# Patient Record
Sex: Female | Born: 1940 | Race: White | Hispanic: No | Marital: Married | Smoking: Never smoker
Health system: Southern US, Community
[De-identification: ages and names within clinical notes are randomized; demographics above are authoritative.]

## PROBLEM LIST (undated history)

## (undated) DIAGNOSIS — I499 Cardiac arrhythmia, unspecified: Secondary | ICD-10-CM

## (undated) DIAGNOSIS — Z8719 Personal history of other diseases of the digestive system: Secondary | ICD-10-CM

## (undated) DIAGNOSIS — M1990 Unspecified osteoarthritis, unspecified site: Secondary | ICD-10-CM

## (undated) DIAGNOSIS — H3530 Unspecified macular degeneration: Secondary | ICD-10-CM

## (undated) DIAGNOSIS — F32A Depression, unspecified: Secondary | ICD-10-CM

## (undated) DIAGNOSIS — J3489 Other specified disorders of nose and nasal sinuses: Secondary | ICD-10-CM

## (undated) DIAGNOSIS — F329 Major depressive disorder, single episode, unspecified: Secondary | ICD-10-CM

## (undated) DIAGNOSIS — I2510 Atherosclerotic heart disease of native coronary artery without angina pectoris: Secondary | ICD-10-CM

## (undated) DIAGNOSIS — E039 Hypothyroidism, unspecified: Secondary | ICD-10-CM

## (undated) DIAGNOSIS — I10 Essential (primary) hypertension: Secondary | ICD-10-CM

## (undated) DIAGNOSIS — K219 Gastro-esophageal reflux disease without esophagitis: Secondary | ICD-10-CM

## (undated) DIAGNOSIS — L4050 Arthropathic psoriasis, unspecified: Secondary | ICD-10-CM

## (undated) DIAGNOSIS — N390 Urinary tract infection, site not specified: Secondary | ICD-10-CM

## (undated) DIAGNOSIS — K7689 Other specified diseases of liver: Secondary | ICD-10-CM

## (undated) HISTORY — PX: INSERT / REPLACE / REMOVE PACEMAKER: SUR710

## (undated) HISTORY — PX: CORONARY ANGIOPLASTY: SHX604

## (undated) HISTORY — PX: DILATION AND CURETTAGE OF UTERUS: SHX78

## (undated) HISTORY — PX: TONSILLECTOMY: SUR1361

## (undated) HISTORY — PX: COLONOSCOPY: SHX174

---

## 1970-07-25 HISTORY — PX: TUBAL LIGATION: SHX77

## 1997-10-15 ENCOUNTER — Other Ambulatory Visit: Admission: RE | Admit: 1997-10-15 | Discharge: 1997-10-15 | Payer: Self-pay | Admitting: Gynecology

## 1998-07-25 HISTORY — PX: BACK SURGERY: SHX140

## 1998-10-22 ENCOUNTER — Other Ambulatory Visit: Admission: RE | Admit: 1998-10-22 | Discharge: 1998-10-22 | Payer: Self-pay | Admitting: Gynecology

## 1999-05-20 ENCOUNTER — Encounter: Payer: Self-pay | Admitting: Neurosurgery

## 1999-05-24 ENCOUNTER — Inpatient Hospital Stay (HOSPITAL_COMMUNITY): Admission: RE | Admit: 1999-05-24 | Discharge: 1999-05-25 | Payer: Self-pay | Admitting: Neurosurgery

## 1999-05-24 ENCOUNTER — Encounter: Payer: Self-pay | Admitting: Neurosurgery

## 1999-11-18 ENCOUNTER — Other Ambulatory Visit: Admission: RE | Admit: 1999-11-18 | Discharge: 1999-11-18 | Payer: Self-pay | Admitting: Gynecology

## 1999-11-29 ENCOUNTER — Encounter: Admission: RE | Admit: 1999-11-29 | Discharge: 1999-11-29 | Payer: Self-pay | Admitting: Gynecology

## 1999-11-29 ENCOUNTER — Encounter: Payer: Self-pay | Admitting: Gynecology

## 2000-01-12 ENCOUNTER — Encounter: Payer: Self-pay | Admitting: Gynecology

## 2000-01-14 ENCOUNTER — Ambulatory Visit (HOSPITAL_COMMUNITY): Admission: RE | Admit: 2000-01-14 | Discharge: 2000-01-14 | Payer: Self-pay | Admitting: Gynecology

## 2000-01-14 ENCOUNTER — Encounter (INDEPENDENT_AMBULATORY_CARE_PROVIDER_SITE_OTHER): Payer: Self-pay | Admitting: Specialist

## 2000-12-07 ENCOUNTER — Other Ambulatory Visit: Admission: RE | Admit: 2000-12-07 | Discharge: 2000-12-07 | Payer: Self-pay | Admitting: Gynecology

## 2001-01-05 ENCOUNTER — Encounter: Payer: Self-pay | Admitting: Gynecology

## 2001-01-05 ENCOUNTER — Encounter: Admission: RE | Admit: 2001-01-05 | Discharge: 2001-01-05 | Payer: Self-pay | Admitting: Gynecology

## 2001-01-16 ENCOUNTER — Ambulatory Visit (HOSPITAL_COMMUNITY): Admission: RE | Admit: 2001-01-16 | Discharge: 2001-01-16 | Payer: Self-pay | Admitting: Urology

## 2001-01-16 ENCOUNTER — Encounter: Payer: Self-pay | Admitting: Urology

## 2001-01-22 ENCOUNTER — Ambulatory Visit (HOSPITAL_COMMUNITY): Admission: RE | Admit: 2001-01-22 | Discharge: 2001-01-22 | Payer: Self-pay | Admitting: Urology

## 2001-12-10 ENCOUNTER — Other Ambulatory Visit: Admission: RE | Admit: 2001-12-10 | Discharge: 2001-12-10 | Payer: Self-pay | Admitting: Gynecology

## 2002-01-07 ENCOUNTER — Encounter: Admission: RE | Admit: 2002-01-07 | Discharge: 2002-01-07 | Payer: Self-pay | Admitting: Gynecology

## 2002-01-07 ENCOUNTER — Encounter: Payer: Self-pay | Admitting: Gynecology

## 2002-12-12 ENCOUNTER — Other Ambulatory Visit: Admission: RE | Admit: 2002-12-12 | Discharge: 2002-12-12 | Payer: Self-pay | Admitting: Gynecology

## 2003-01-09 ENCOUNTER — Encounter: Admission: RE | Admit: 2003-01-09 | Discharge: 2003-01-09 | Payer: Self-pay | Admitting: Gynecology

## 2003-01-09 ENCOUNTER — Encounter: Payer: Self-pay | Admitting: Gynecology

## 2003-07-26 HISTORY — PX: OTHER SURGICAL HISTORY: SHX169

## 2003-12-17 ENCOUNTER — Other Ambulatory Visit: Admission: RE | Admit: 2003-12-17 | Discharge: 2003-12-17 | Payer: Self-pay | Admitting: Gynecology

## 2004-01-12 ENCOUNTER — Ambulatory Visit (HOSPITAL_COMMUNITY): Admission: RE | Admit: 2004-01-12 | Discharge: 2004-01-12 | Payer: Self-pay | Admitting: Gynecology

## 2004-05-25 ENCOUNTER — Ambulatory Visit (HOSPITAL_COMMUNITY): Admission: RE | Admit: 2004-05-25 | Discharge: 2004-05-25 | Payer: Self-pay | Admitting: Neurosurgery

## 2004-12-23 ENCOUNTER — Other Ambulatory Visit: Admission: RE | Admit: 2004-12-23 | Discharge: 2004-12-23 | Payer: Self-pay | Admitting: Gynecology

## 2005-01-12 ENCOUNTER — Ambulatory Visit (HOSPITAL_COMMUNITY): Admission: RE | Admit: 2005-01-12 | Discharge: 2005-01-12 | Payer: Self-pay | Admitting: Gynecology

## 2005-05-13 ENCOUNTER — Encounter: Admission: RE | Admit: 2005-05-13 | Discharge: 2005-05-13 | Payer: Self-pay | Admitting: Family Medicine

## 2005-08-30 ENCOUNTER — Encounter: Admission: RE | Admit: 2005-08-30 | Discharge: 2005-08-30 | Payer: Self-pay | Admitting: Family Medicine

## 2005-11-25 ENCOUNTER — Encounter: Admission: RE | Admit: 2005-11-25 | Discharge: 2005-11-25 | Payer: Self-pay | Admitting: Family Medicine

## 2005-11-28 ENCOUNTER — Encounter: Admission: RE | Admit: 2005-11-28 | Discharge: 2006-01-06 | Payer: Self-pay | Admitting: Family Medicine

## 2006-01-13 ENCOUNTER — Ambulatory Visit (HOSPITAL_COMMUNITY): Admission: RE | Admit: 2006-01-13 | Discharge: 2006-01-13 | Payer: Self-pay | Admitting: Gynecology

## 2006-07-25 HISTORY — PX: RECTAL PROLAPSE REPAIR: SHX759

## 2006-12-28 ENCOUNTER — Other Ambulatory Visit: Admission: RE | Admit: 2006-12-28 | Discharge: 2006-12-28 | Payer: Self-pay | Admitting: Gynecology

## 2007-01-23 ENCOUNTER — Ambulatory Visit (HOSPITAL_COMMUNITY): Admission: RE | Admit: 2007-01-23 | Discharge: 2007-01-23 | Payer: Self-pay | Admitting: Gynecology

## 2008-01-24 ENCOUNTER — Ambulatory Visit (HOSPITAL_COMMUNITY): Admission: RE | Admit: 2008-01-24 | Discharge: 2008-01-24 | Payer: Self-pay | Admitting: Gynecology

## 2008-04-07 ENCOUNTER — Ambulatory Visit (HOSPITAL_BASED_OUTPATIENT_CLINIC_OR_DEPARTMENT_OTHER): Admission: RE | Admit: 2008-04-07 | Discharge: 2008-04-08 | Payer: Self-pay | Admitting: Gynecology

## 2009-01-27 ENCOUNTER — Ambulatory Visit (HOSPITAL_COMMUNITY): Admission: RE | Admit: 2009-01-27 | Discharge: 2009-01-27 | Payer: Self-pay | Admitting: Gynecology

## 2010-01-28 ENCOUNTER — Ambulatory Visit (HOSPITAL_COMMUNITY): Admission: RE | Admit: 2010-01-28 | Discharge: 2010-01-28 | Payer: Self-pay | Admitting: Gynecology

## 2010-12-07 NOTE — Op Note (Signed)
[name], [name]                ACCOUNT NO.:  [id number]   MEDICAL RECORD NO.:  [id number]          PATIENT TYPE:  AMB   LOCATION:  [facility]                         FACILITY:  [facility]   PHYSICIAN:  Gretta Cool, M.D. 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   PROCEDURE:  Rectocele, enterocele and cardinal uterosacral colposuspension.   SURGEON:  Gretta Cool, M.D.   ASSISTANT:  Luvenia Redden, M.D.   ANESTHESIA:  General by LMA.   BRIEF HISTORY:  A 70 year old gravida 3, para 2 with progress pelvic organ prolapse with  severe fascial detachment of the posterior vaginal fascia with grade 3  rectocele and enterocele.  She has had a sling procedure, Dr. Vernie Ammons,  but has had more rapid progression of her posterior support weakness  since that time.  Now presents for definitive therapy.  She understands  the options and alternatives.  Is not willing to consider pessary.   DESCRIPTION OF PROCEDURE:  Under general anesthesia, as above, with the patient prepped and draped  in Allen stirrups with the Foley catheter draining her bladder, the  vagina was grasped at the introitus with Allis clamps and then  infiltrated with Xylocaine 1% with epi.  At this point, the mucosa was  incised, and the incision dissected all the way to the apex of the  vaginal cuff.  The vaginal mucosa was then dissected from the perirectal  fascia.  Cut edges of the mucosa were held by Allis clamps.  At this  point, the enormous enterocele was identified and plicated with a series  of purse-string sutures of 0 Vicryl.  Once the enterocele was reduced,  the posterior fascial defects were identified.  There was enormous  detachment of the perirectal fascia from the cervix and the uterosacral  area attachments.  The whole posterior half of the fascia was detached  and had slipped to near the introitus.  At this point, the uterosacral  ligaments are identified.  Sutures are placed in the ligaments, and the  perirectal fascia sutured to the uterosacral ligament complex, cardinal  uterosacral complex.  At this point, the Novofil sutures were tied, and  the fascia pulled all the way to the apex of the vagina and behind the  cervix.  The central portion of the levator tear was then secured to the  posterior aspect of the cervix.  The central fascia was then plicated in  the midline and secured to the uterosacral from the apex of the vagina  to the introitus.  At this point, the mucosa was trimmed.  The upper  portion of the perirectal fascia and mucosa were then closed as a  subcuticular closure from the apex of the vagina to near the introitus.  Once the level of the perineal body was reached, the perineal body  muscles were plicated in the midline with interrupted sutures of #2-0  Vicryl.  The mucosa and skin was then approximated also by subcuticular closure  of 2-0 Vicryl.  At this point, the procedure was terminated without  complication.  Patient returned to the recovery room in excellent  condition.   No tissue to path.           ______________________________  Gretta Cool, M.D.     CWL/MEDQ  D:  04/07/2008  T:  04/07/2008  Job:  [id number]   cc:   Duncan Dull, M.D.  Fax: [phone]   Luvenia Redden, M.D.  Fax: [phone]

## 2010-12-10 NOTE — Op Note (Signed)
[place]  Patient:    Connie [name] Visit Number: [id number] MRN: [id number]          Service Type: SUR Location: 3000 [id number] Attending Physician:  Barton Fanny Proc. Date: 05/24/99 Admit Date:  05/24/1999   CC:         Hewitt Shorts, M.D.   Operative Report  PREOPERATIVE DIAGNOSIS:  Right L4-5 lumbar disk herniation.  POSTOPERATIVE DIAGNOSIS:  Right L4-5 lumbar disk herniation.  OPERATION PERFORMED:  Right L4-5 lumbar laminotomy and microdiskectomy.  SURGEON:  Hewitt Shorts, M.D.  ASSISTANT:  Alanson Aly. Roxan Hockey, M.D.  ANESTHESIA:  General endotracheal.  INDICATIONS FOR PROCEDURE:  Patient is a 70 year old woman who presented with a  right L5 radiculopathy, who was found by MRI scan to have right L4-5 lumbar disk herniation.  Decision was made to proceed with elective laminotomy and microdiskectomy.  DESCRIPTION OF PROCEDURE:  The patient was brought to the operating room and placed under general endotracheal anesthesia.  The patient was turned to a prone position. The lumbar region was prepped with Betadine soap and solution and draped in a sterile fashion.  The midline was infiltrated with local anesthetic with epinephrine.  Localizing x-ray was taken.  Incision made over the L4-5 level. Dissection was carried down through the subcutaneous tissues.  Bipolar cautery nd electrocautery were used to maintain hemostasis.  Dissection was carried down to the lumbar fascia incising the right side of the midline and the paraspinal muscles were dissected from the spinous process of the lamina in subperiosteal fashion.  Additional localizing x-rays were taken and the L4-5 interlaminar space identified and a right L4-5 lumbar laminotomy was performed using a Midas Rex drill with an AM8 bur and Kerrison punches.  The ligamentum flavum was removed and the underlying thecal sac and nerve root identified.  The  microscope was draped and brought into the field to provide additional magnification, illumination and visualization. The remainder of the procedure was done using microdissection technique.  We gently mobilized the thecal sac and nerve root medially exposing a free fragment of disk herniation directly underlying the right L5 nerve root.  This was carefully dissected from the surrounding epidural tissues and removed and in the end we removed four free fragments of disk material and thereby we were able to decompress the thecal sac and nerve root.  The L4-5 disk appeared degenerated.  However, the hole through which the disk herniation occurred had nearly sealed over.  The edges were coagulated and in the end it was felt best to slowly remove the free fragments and not to enter the disk space and therefore, we carefully examined the epidural space to make sure that no further fragments were seen.  It is felt that the disk herniation had been completely removed and that good decompression of the thecal sac and nerve root had been achieved.  We therefore established hemostasis and hen infused 2 cc of fentanyl and 80 mg of Depo-Medrol into the epidural space and then proceeded with closure.  The deep fascia closed with interrupted undyed 0 Vicryl sutures.  The subcutaneous and subcuticular layer were closed with interrupted inverted 2-0 undyed Vicryl sutures and the skin edges were reapproximated with Steri-Strips.  The wound was dressed with Adaptic and sterile gauze.  The patient tolerated the procedure well.  Estimated blood loss was less than 50 cc.  The sponge and needle counts were correct.  Following surgery the patient was turned back to  supine position to be reversed from anesthetic and transferred to the recovery room for further care. Attending Physician:  Barton Fanny DD:  05/24/99 TD:  05/24/99 Job: [id number]

## 2010-12-10 NOTE — Op Note (Signed)
[facility]  Patient:    Connie [name], Connie [name]                       MRN: [id number] Proc. Date: 01/14/00 Adm. Date:  [id number] Attending:  Katrina Stack CC:         Gretta Cool, M.D. (2)                           Operative Report  PREOPERATIVE DIAGNOSIS:  Endometrial polyp with abnormal uterine bleeding.  POSTOPERATIVE DIAGNOSIS:  Endometrial polyp with abnormal uterine bleeding.  PROCEDURE:  Hysteroscopy, resection, ablation, plus VaporTrode, with resection f endometrial polyp.  SURGEON:  Gretta Cool, M.D.  ANESTHESIA:  IV sedation, plus paracervical block.  DESCRIPTION OF PROCEDURE:  Under excellent IV sedation as above, with the patient prepped and draped in Allen stirrups, the cervix was grasped with a single tooth tenaculum and pulled down into view.  The cervix was then progressively dilated to accommodate a 7.0 mm resectoscope.  The endometrial cavity was then surveyed and photos taken, and the polyp demonstrated.  The polyp was then resected.  The entire endometrial cavity was then resected totally, so as to remove all of the viable  endometrial tissue.  Once the total resection was completed, and there was no further evidence of viable endometrial tissue, the VaporTrode was applied, and he entire cavity once again treated with VaporTrode to remove any islands of viable tissue not resected.  At this point, with reduced pressure, there was no significant bleeding.  At the end of the procedure, there was no significant fluid deficit, less than 100 cc.  Complications:  None.  The patient returned to the recovery room in excellent condition. DD:  01/14/00 TD:  01/14/00 Job: [id number]

## 2010-12-10 NOTE — Op Note (Signed)
[facility]  Patient:    Connie [name], Connie [name]                       MRN: [id number] Proc. Date: 01/22/01 Adm. Date:  [id number] Attending:  Trisha Mangle                           Operative Report  PREOPERATIVE DIAGNOSES: 1. Cystocele. 2. Stress urinary incontinence.  POSTOPERATIVE DIAGNOSES: 1. Cystocele. 2. Stress urinary incontinence.  PROCEDURE:  Anterior repair and tension-free vaginal tape urethral suspension.  SURGEON:  Mark C. Vernie Ammons, M.D.  ANESTHESIA:  General.  DRAIN:  14 French suprapubic tube.  BLOOD LOSS:  Approximately 200 cc.  SPECIMENS:  None.  COMPLICATIONS:  None.  INDICATIONS:  The patient is a 70 year old white female with significant urinary incontinence.  She has tried Kegel exercises unsuccessfully and currently has to wear pads during the day to protect her clothing.  She was found to have a grade 2 cystocele with Valsalva and leaked urine with minimal provocation.  She had loss of urethrovesical junction support and had no irritative voiding symptoms or urge incontinence.  She therefore is brought to the OR today for repair of her cystocele and tension-free vaginal tape.  The risks, complications, alternatives, and limitations were discussed. She understands and wishes to proceed.  DESCRIPTION OF PROCEDURE:  After informed consent, the patient was brought to the major OR, placed on the table, and administered general anesthesia and moved into the dorsal lithotomy position.  Her genitalia, lower abdomen, and vagina were sterilely prepped and draped.  A 16 French Foley catheter was inserted in the bladder, and it was drained, and an Allis was placed beneath the meatus.  A weighted speculum was inserted, and the subvaginal mucosa anteriorly in the midline was then infiltrated with 1% lidocaine with epinephrine.  After allowing adequate time for epinephrine effect, the midline incision was then made and carried back  into the posterior vaginal region. The Allis clamps were placed on the mucosal edges and a combination of blunt and sharp technique was used to expose the bladder base region and lateral fascial edges.  The fascia was then reapproximated in the midline with interrupted 2-0 Vicryl suture, obliterating the cystocele nicely.  I then reapproximated the vaginal mucosal edges with a running 2-0 Vicryl suture. Prior to completing the closure of this incision, I extended it further over the mid urethral region which was felt by palpating the Foley catheter balloon at the bladder neck.  Suprapubic stab incisions were then made 3 fingerbreadths apart above the symphysis pubis and through this stab incision, first on the left side, the abdominal passing instrument for the TVT device was then passed just behind the symphysis pubis and palpated next to the urethra and piercing the endopelvic fascia, brought out next to the urethra at the mid urethral level. The catheter was removed, and the cystoscope with 70 degree lens was inserted, and the bladder was noted to be free of any injury.  I therefore attached the one end of the vaginal tape to the abdominal passer and passed this up through the abdominal incision.  An identified procedure was performed on the contralateral side.  Again, no injury was noted cystoscopically.  The bladder was then filled, and took opacity in the patient.  The patient was placed in exaggerated Trendelenburg position.  A separate suprapubic incision was then made, and the trocar and  suprapubic tube was then passed through the dome of the bladder under direct cystoscopic control.  Curl was placed in the suprapubic tube, and it was secured to the skin with a 2-0 silk suture.  I then flattened the patient out and flattened the tape over an instrument.  I pressed on the patients abdomen and noted free flow of urine, so I tightened that somewhat further.  Further pressure did  produce a few drops of urine; therefore, I removed the plastic sheath from the tape, securing it in position and then divided the tape flush with the skin.  This area was infiltrated with 0.5% Marcaine with epinephrine as was the suprapubic tube site and then irrigated with antibiotic solution.  The suprapubic stab incisions were then closed with Dermabond.  I then closed the vaginal incision with the remaining 2-0 Vicryl suture. Prior to closing, the area was copiously irrigated with antibiotic irrigation and then the closure was completed.  I then used vaginal packing, using 2 inch Iodoform gauze.  The suprapubic tube was opened and connected to closed system drainage, and the patient was awakened and taken to the recovery room in stable and satisfactory condition.  She tolerated the procedure well, and there were no intraoperative complications.  Sponge, needle and instrument counts were reportedly correct x 2 at the end of the operation.  She will be discharged with a prescription for #40 Tylox and Levaquin 200 mg #7, and follow up in my office in one week for suprapubic tube removal.  She was given written instructions on suprapubic and wound care. DD:  01/22/01 TD:  01/22/01 Job: [id number]

## 2011-01-05 ENCOUNTER — Other Ambulatory Visit: Payer: Self-pay | Admitting: Gynecology

## 2011-01-05 DIAGNOSIS — Z1231 Encounter for screening mammogram for malignant neoplasm of breast: Secondary | ICD-10-CM

## 2011-01-31 ENCOUNTER — Ambulatory Visit: Payer: Self-pay

## 2011-01-31 ENCOUNTER — Other Ambulatory Visit (HOSPITAL_COMMUNITY): Payer: Self-pay | Admitting: Gynecology

## 2011-01-31 DIAGNOSIS — Z1231 Encounter for screening mammogram for malignant neoplasm of breast: Secondary | ICD-10-CM

## 2011-02-15 ENCOUNTER — Ambulatory Visit (HOSPITAL_COMMUNITY)
Admission: RE | Admit: 2011-02-15 | Discharge: 2011-02-15 | Disposition: A | Discharge status: Home / Self Care | Payer: Medicare Other | Source: Ambulatory Visit | Attending: Gynecology | Admitting: Gynecology

## 2011-02-15 DIAGNOSIS — Z1231 Encounter for screening mammogram for malignant neoplasm of breast: Secondary | ICD-10-CM | POA: Insufficient documentation

## 2011-04-27 LAB — BASIC METABOLIC PANEL
BUN: 14
CO2: 29
Calcium: 9.5
Chloride: 100
Creatinine, Ser: 0.95
GFR calc Af Amer: >60
GFR calc non Af Amer: 58 — ABNORMAL LOW
Glucose, Bld: 91
Potassium: 4.4
Sodium: 137

## 2011-04-27 LAB — POCT HEMOGLOBIN-HEMACUE: Hemoglobin: 12.5

## 2012-01-09 ENCOUNTER — Other Ambulatory Visit (HOSPITAL_COMMUNITY): Payer: Self-pay | Admitting: Gynecology

## 2012-01-09 DIAGNOSIS — Z1231 Encounter for screening mammogram for malignant neoplasm of breast: Secondary | ICD-10-CM

## 2012-02-08 ENCOUNTER — Other Ambulatory Visit: Payer: Self-pay | Admitting: Gastroenterology

## 2012-02-08 DIAGNOSIS — R1013 Epigastric pain: Secondary | ICD-10-CM

## 2012-02-14 ENCOUNTER — Other Ambulatory Visit: Payer: Medicare Other

## 2012-02-17 ENCOUNTER — Ambulatory Visit (HOSPITAL_COMMUNITY)
Admission: RE | Admit: 2012-02-17 | Discharge: 2012-02-17 | Disposition: A | Discharge status: Home / Self Care | Payer: Medicare Other | Source: Ambulatory Visit | Attending: Gynecology | Admitting: Gynecology

## 2012-02-17 DIAGNOSIS — Z1231 Encounter for screening mammogram for malignant neoplasm of breast: Secondary | ICD-10-CM | POA: Insufficient documentation

## 2012-02-20 ENCOUNTER — Ambulatory Visit
Admission: RE | Admit: 2012-02-20 | Discharge: 2012-02-20 | Disposition: A | Discharge status: Home / Self Care | Payer: Medicare Other | Source: Ambulatory Visit | Attending: Gastroenterology | Admitting: Gastroenterology

## 2012-02-20 DIAGNOSIS — R1013 Epigastric pain: Secondary | ICD-10-CM

## 2013-03-06 ENCOUNTER — Other Ambulatory Visit (HOSPITAL_COMMUNITY): Payer: Self-pay | Admitting: Gynecology

## 2013-03-06 DIAGNOSIS — Z1231 Encounter for screening mammogram for malignant neoplasm of breast: Secondary | ICD-10-CM

## 2013-03-13 ENCOUNTER — Ambulatory Visit (HOSPITAL_COMMUNITY)
Admission: RE | Admit: 2013-03-13 | Discharge: 2013-03-13 | Disposition: A | Discharge status: Home / Self Care | Payer: Medicare Other | Source: Ambulatory Visit | Attending: Gynecology | Admitting: Gynecology

## 2013-03-13 ENCOUNTER — Other Ambulatory Visit (HOSPITAL_COMMUNITY): Payer: Self-pay | Admitting: Gynecology

## 2013-03-13 DIAGNOSIS — Z1231 Encounter for screening mammogram for malignant neoplasm of breast: Secondary | ICD-10-CM

## 2014-02-27 ENCOUNTER — Other Ambulatory Visit (HOSPITAL_COMMUNITY): Payer: Self-pay | Admitting: Family Medicine

## 2014-02-27 DIAGNOSIS — Z1231 Encounter for screening mammogram for malignant neoplasm of breast: Secondary | ICD-10-CM

## 2014-03-14 ENCOUNTER — Other Ambulatory Visit (HOSPITAL_COMMUNITY): Payer: Self-pay | Admitting: Family Medicine

## 2014-03-14 ENCOUNTER — Ambulatory Visit (HOSPITAL_COMMUNITY)
Admission: RE | Admit: 2014-03-14 | Discharge: 2014-03-14 | Disposition: A | Discharge status: Home / Self Care | Payer: Medicare Other | Source: Ambulatory Visit | Attending: Family Medicine | Admitting: Family Medicine

## 2014-03-14 DIAGNOSIS — Z1231 Encounter for screening mammogram for malignant neoplasm of breast: Secondary | ICD-10-CM | POA: Diagnosis not present

## 2014-08-11 ENCOUNTER — Other Ambulatory Visit: Payer: Self-pay | Admitting: Orthopedic Surgery

## 2014-08-22 NOTE — Pre-Procedure Instructions (Signed)
Connie BombardJudith D [name]  08/22/2014   Your procedure is scheduled on:  Friday September 05, 2014 at 7:30 AM.  [facility] at 5:30 AM.  Call this number if you have problems the morning of surgery: [phone]  For any other questions Monday-Friday from 8am-4pm call: [phone]  Remember:   Do not eat food or drink liquids after midnight.   Take these medicines the morning of surgery with A SIP OF WATER: Esomeprazole (Nexium), Levothyroxine (Synthroid), Sertraline (Zoloft)   Please stop taking any Advil, Ibuprofen, Motrin, Naproxen, etc on Friday 08/29/14   Do not wear jewelry, make-up or nail polish.  Do not wear lotions, powders, or perfumes.   Do not shave 48 hours prior to surgery.   Do not bring valuables to the hospital.  [facility] is not responsible for any belongings or valuables.               Contacts, dentures or bridgework may not be worn into surgery.  Leave suitcase in the car. After surgery it may be brought to your room.  For patients admitted to the hospital, discharge time is determined by your treatment team.               Patients discharged the day of surgery will not be allowed to drive home.    Special Instructions:[place] - Preparing for Surgery  Before surgery, you can play an important role.  Because skin is not sterile, your skin needs to be as free of germs as possible.  You can reduce the number of germs on you skin by washing with CHG (chlorahexidine gluconate) soap before surgery.  CHG is an antiseptic cleaner which kills germs and bonds with the skin to continue killing germs even after washing.  Please DO NOT use if you have an allergy to CHG or antibacterial soaps.  If your skin becomes reddened/irritated stop using the CHG and inform your nurse when you arrive at Short Stay.  Do not shave (including legs and underarms) for at least 48 hours prior to the first CHG shower.  You may shave your face.  Please follow these  instructions carefully:   1.  Shower with CHG Soap the night before surgery and the                                morning of Surgery.  2.  If you choose to wash your hair, wash your hair first as usual with your       normal shampoo.  3.  After you shampoo, rinse your hair and body thoroughly to remove the                      Shampoo.  4.  Use CHG as you would any other liquid soap.  You can apply chg directly       to the skin and wash gently with scrungie or a clean washcloth.  5.  Apply the CHG Soap to your body ONLY FROM THE NECK DOWN.        Do not use on open wounds or open sores.  Avoid contact with your eyes,       ears, mouth and genitals (private parts).  Wash genitals (private parts)       with your normal soap.  6.  Wash thoroughly, paying special attention to the area where your surgery  will be performed.  7.  Thoroughly rinse your body with warm water from the neck down.  8.  DO NOT shower/wash with your normal soap after using and rinsing off       the CHG Soap.  9.  Pat yourself dry with a clean towel.            10.  Wear clean pajamas.            11.  Place clean sheets on your bed the night of your first shower and do not        sleep with pets.  Day of Surgery  Do not apply any lotions/deoderants the morning of surgery.  Please wear clean clothes to the hospital/surgery center.      Please read over the following fact sheets that you were given: Pain Booklet, Coughing and Deep Breathing, Blood Transfusion Information, MRSA Information and Surgical Site Infection Prevention

## 2014-08-25 ENCOUNTER — Encounter (HOSPITAL_COMMUNITY): Payer: Self-pay

## 2014-08-25 ENCOUNTER — Ambulatory Visit (HOSPITAL_COMMUNITY)
Admission: RE | Admit: 2014-08-25 | Discharge: 2014-08-25 | Disposition: A | Discharge status: Home / Self Care | Payer: Medicare Other | Source: Ambulatory Visit | Attending: Orthopedic Surgery | Admitting: Orthopedic Surgery

## 2014-08-25 ENCOUNTER — Encounter (HOSPITAL_COMMUNITY)
Admission: RE | Admit: 2014-08-25 | Discharge: 2014-08-25 | Disposition: A | Discharge status: Home / Self Care | Payer: Medicare Other | Source: Ambulatory Visit | Attending: Orthopedic Surgery | Admitting: Orthopedic Surgery

## 2014-08-25 DIAGNOSIS — E039 Hypothyroidism, unspecified: Secondary | ICD-10-CM | POA: Insufficient documentation

## 2014-08-25 DIAGNOSIS — I498 Other specified cardiac arrhythmias: Secondary | ICD-10-CM | POA: Diagnosis not present

## 2014-08-25 DIAGNOSIS — Z0183 Encounter for blood typing: Secondary | ICD-10-CM | POA: Diagnosis not present

## 2014-08-25 DIAGNOSIS — Z01818 Encounter for other preprocedural examination: Secondary | ICD-10-CM

## 2014-08-25 DIAGNOSIS — Z01812 Encounter for preprocedural laboratory examination: Secondary | ICD-10-CM | POA: Diagnosis not present

## 2014-08-25 DIAGNOSIS — K219 Gastro-esophageal reflux disease without esophagitis: Secondary | ICD-10-CM | POA: Diagnosis not present

## 2014-08-25 DIAGNOSIS — I10 Essential (primary) hypertension: Secondary | ICD-10-CM | POA: Diagnosis not present

## 2014-08-25 DIAGNOSIS — I452 Bifascicular block: Secondary | ICD-10-CM | POA: Diagnosis not present

## 2014-08-25 DIAGNOSIS — M179 Osteoarthritis of knee, unspecified: Secondary | ICD-10-CM | POA: Diagnosis not present

## 2014-08-25 HISTORY — DX: Essential (primary) hypertension: I10

## 2014-08-25 HISTORY — DX: Unspecified osteoarthritis, unspecified site: M19.90

## 2014-08-25 HISTORY — DX: Depression, unspecified: F32.A

## 2014-08-25 HISTORY — DX: Major depressive disorder, single episode, unspecified: F32.9

## 2014-08-25 HISTORY — DX: Other specified disorders of nose and nasal sinuses: J34.89

## 2014-08-25 HISTORY — DX: Gastro-esophageal reflux disease without esophagitis: K21.9

## 2014-08-25 HISTORY — DX: Personal history of other diseases of the digestive system: Z87.19

## 2014-08-25 HISTORY — DX: Hypothyroidism, unspecified: E03.9

## 2014-08-25 LAB — URINALYSIS, ROUTINE W REFLEX MICROSCOPIC
BILIRUBIN URINE: NEGATIVE
Glucose, UA: NEGATIVE mg/dL
Hgb urine dipstick: NEGATIVE
KETONES UR: NEGATIVE mg/dL
Leukocytes, UA: NEGATIVE
Nitrite: NEGATIVE
PROTEIN: NEGATIVE mg/dL
Specific Gravity, Urine: 1.01 (ref 1.005–1.030)
UROBILINOGEN UA: 0.2 mg/dL (ref 0.0–1.0)
pH: 6 (ref 5.0–8.0)

## 2014-08-25 LAB — COMPREHENSIVE METABOLIC PANEL
ALK PHOS: 102 U/L (ref 39–117)
ALT: 13 U/L (ref 0–35)
AST: 25 U/L (ref 0–37)
Albumin: 4.1 g/dL (ref 3.5–5.2)
Anion gap: 13 (ref 5–15)
BUN: 15 mg/dL (ref 6–23)
CHLORIDE: 104 mmol/L (ref 96–112)
CO2: 24 mmol/L (ref 19–32)
Calcium: 10 mg/dL (ref 8.4–10.5)
Creatinine, Ser: 0.84 mg/dL (ref 0.50–1.10)
GFR calc Af Amer: 77 mL/min — ABNORMAL LOW (ref >90)
GFR calc non Af Amer: 66 mL/min — ABNORMAL LOW (ref >90)
Glucose, Bld: 105 mg/dL — ABNORMAL HIGH (ref 70–99)
Potassium: 4.4 mmol/L (ref 3.5–5.1)
Sodium: 141 mmol/L (ref 135–145)
Total Bilirubin: 0.5 mg/dL (ref 0.3–1.2)
Total Protein: 7 g/dL (ref 6.0–8.3)

## 2014-08-25 LAB — CBC WITH DIFFERENTIAL/PLATELET
BASOS PCT: 1 % (ref 0–1)
Basophils Absolute: 0.1 10*3/uL (ref 0.0–0.1)
EOS ABS: 0.1 10*3/uL (ref 0.0–0.7)
Eosinophils Relative: 1 % (ref 0–5)
HCT: 40 % (ref 36.0–46.0)
Hemoglobin: 13.3 g/dL (ref 12.0–15.0)
LYMPHS PCT: 14 % (ref 12–46)
Lymphs Abs: 1.3 10*3/uL (ref 0.7–4.0)
MCH: 28.7 pg (ref 26.0–34.0)
MCHC: 33.3 g/dL (ref 30.0–36.0)
MCV: 86.2 fL (ref 78.0–100.0)
MONO ABS: 1 10*3/uL (ref 0.1–1.0)
MONOS PCT: 10 % (ref 3–12)
NEUTROS ABS: 7.3 10*3/uL (ref 1.7–7.7)
NEUTROS PCT: 74 % (ref 43–77)
Platelets: 353 10*3/uL (ref 150–400)
RBC: 4.64 MIL/uL (ref 3.87–5.11)
RDW: 13 % (ref 11.5–15.5)
WBC: 9.8 10*3/uL (ref 4.0–10.5)

## 2014-08-25 LAB — APTT: aPTT: 35 seconds (ref 24–37)

## 2014-08-25 LAB — PROTIME-INR
INR: 1 (ref 0.00–1.49)
Prothrombin Time: 13.3 seconds (ref 11.6–15.2)

## 2014-08-25 LAB — ABO/RH: ABO/RH(D): O POS

## 2014-08-25 LAB — SURGICAL PCR SCREEN
MRSA, PCR: NEGATIVE
Staphylococcus aureus: NEGATIVE

## 2014-08-25 LAB — TYPE AND SCREEN
ABO/RH(D): O POS
ANTIBODY SCREEN: NEGATIVE

## 2014-08-25 MED ORDER — CHLORHEXIDINE GLUCONATE 4 % EX LIQD: 60.0000 mL | Freq: Once | CUTANEOUS | Status: DC

## 2014-08-26 NOTE — Progress Notes (Signed)
Anesthesia Chart Review:  Pt is 74 year old female scheduled for R total knee arthroplasty on 09/05/2014 with Dr. Luiz BlareGraves.   PMH includes: HTN, hypothyroidism, GERD. BMI 32.   Preoperative labs reviewed.    EKG: Normal sinus rhythm with sinus arrhythmia. Low voltage QRS. 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 Since previous tracing 04/07/08 RBBB new.  Spoke with pt by telephone. Pt denies cp or SOB with activity. Reports she isn't able to be very active due to knee pain, but does clean own house and walk and does not develop sx with these activities.   [name], PA discussed case with Dr. Michelle Piperssey.   As pt is asymptomatic and only change to EKG is new RBBB, I anticipate pt can proceed with surgery.   [name], FNP-BC [facility]Anesthesiology Phone: [phone] 08/26/2014 1:44 PM

## 2014-09-04 MED ORDER — CEFAZOLIN SODIUM-DEXTROSE 2-3 GM-% IV SOLR
2.0000 g | INTRAVENOUS | Status: AC
  Administered 2014-09-05: .25 g via INTRAVENOUS
  Administered 2014-09-05: 1.75 g via INTRAVENOUS

## 2014-09-05 ENCOUNTER — Encounter (HOSPITAL_COMMUNITY)
Admission: RE | Disposition: A | Discharge status: Home Health | Payer: Self-pay | Source: Ambulatory Visit | Attending: Orthopedic Surgery

## 2014-09-05 ENCOUNTER — Inpatient Hospital Stay (HOSPITAL_COMMUNITY): Payer: Medicare Other | Admitting: Vascular Surgery

## 2014-09-05 ENCOUNTER — Encounter (HOSPITAL_COMMUNITY): Payer: Self-pay | Admitting: Surgery

## 2014-09-05 ENCOUNTER — Inpatient Hospital Stay (HOSPITAL_COMMUNITY)
Admission: RE | Admit: 2014-09-05 | Discharge: 2014-09-07 | DRG: 470 | Disposition: A | Discharge status: Home Health | Payer: Medicare Other | Source: Ambulatory Visit | Attending: Orthopedic Surgery | Admitting: Orthopedic Surgery

## 2014-09-05 ENCOUNTER — Inpatient Hospital Stay (HOSPITAL_COMMUNITY): Payer: Medicare Other | Admitting: Anesthesiology

## 2014-09-05 DIAGNOSIS — F329 Major depressive disorder, single episode, unspecified: Secondary | ICD-10-CM | POA: Diagnosis present

## 2014-09-05 DIAGNOSIS — K219 Gastro-esophageal reflux disease without esophagitis: Secondary | ICD-10-CM | POA: Diagnosis not present

## 2014-09-05 DIAGNOSIS — Z7982 Long term (current) use of aspirin: Secondary | ICD-10-CM | POA: Diagnosis not present

## 2014-09-05 DIAGNOSIS — M25561 Pain in right knee: Secondary | ICD-10-CM | POA: Diagnosis present

## 2014-09-05 DIAGNOSIS — E039 Hypothyroidism, unspecified: Secondary | ICD-10-CM | POA: Diagnosis not present

## 2014-09-05 DIAGNOSIS — M1711 Unilateral primary osteoarthritis, right knee: Principal | ICD-10-CM | POA: Diagnosis present

## 2014-09-05 DIAGNOSIS — Z79899 Other long term (current) drug therapy: Secondary | ICD-10-CM | POA: Diagnosis not present

## 2014-09-05 DIAGNOSIS — I10 Essential (primary) hypertension: Secondary | ICD-10-CM | POA: Diagnosis present

## 2014-09-05 HISTORY — PX: TOTAL KNEE ARTHROPLASTY: SHX125

## 2014-09-05 SURGERY — ARTHROPLASTY, KNEE, TOTAL
Anesthesia: General | Laterality: Right

## 2014-09-05 MED ORDER — BUPIVACAINE LIPOSOME 1.3 % IJ SUSP
20.0000 mL | INTRAMUSCULAR | Status: AC
  Administered 2014-09-05: 20 mL
  Filled 2014-09-05: qty 20

## 2014-09-05 MED ORDER — FLEET ENEMA 7-19 GM/118ML RE ENEM: 1.0000 | ENEMA | Freq: Once | RECTAL | Status: AC | PRN

## 2014-09-05 MED ORDER — EPHEDRINE SULFATE 50 MG/ML IJ SOLN
INTRAMUSCULAR | Status: AC
  Filled 2014-09-05: qty 1

## 2014-09-05 MED ORDER — NEOSTIGMINE METHYLSULFATE 10 MG/10ML IV SOLN
INTRAVENOUS | Status: AC
  Filled 2014-09-05: qty 1

## 2014-09-05 MED ORDER — PROMETHAZINE HCL 25 MG/ML IJ SOLN: 6.2500 mg | Freq: Four times a day (QID) | INTRAMUSCULAR | Status: DC | PRN

## 2014-09-05 MED ORDER — SODIUM CHLORIDE 0.9 % IV SOLN
INTRAVENOUS | Status: DC
  Administered 2014-09-05: 23:00:00 via INTRAVENOUS

## 2014-09-05 MED ORDER — ARTIFICIAL TEARS OP OINT
TOPICAL_OINTMENT | OPHTHALMIC | Status: AC
  Filled 2014-09-05: qty 3.5

## 2014-09-05 MED ORDER — OXYCODONE-ACETAMINOPHEN 5-325 MG PO TABS
ORAL_TABLET | ORAL | Status: AC
  Administered 2014-09-05: 2 via ORAL
  Filled 2014-09-05: qty 2

## 2014-09-05 MED ORDER — PANTOPRAZOLE SODIUM 40 MG PO TBEC
40.0000 mg | DELAYED_RELEASE_TABLET | Freq: Every day | ORAL | Status: DC
  Administered 2014-09-06 – 2014-09-07 (×2): 40 mg via ORAL
  Filled 2014-09-05: qty 1

## 2014-09-05 MED ORDER — NEOSTIGMINE METHYLSULFATE 10 MG/10ML IV SOLN
INTRAVENOUS | Status: DC | PRN
  Administered 2014-09-05: 3 mg via INTRAVENOUS

## 2014-09-05 MED ORDER — PRAVASTATIN SODIUM 40 MG PO TABS
40.0000 mg | ORAL_TABLET | Freq: Every day | ORAL | Status: DC
  Administered 2014-09-06: 40 mg via ORAL
  Filled 2014-09-05 (×3): qty 1

## 2014-09-05 MED ORDER — DEXAMETHASONE SODIUM PHOSPHATE 4 MG/ML IJ SOLN
INTRAMUSCULAR | Status: DC | PRN
  Administered 2014-09-05: 4 mg via INTRAVENOUS

## 2014-09-05 MED ORDER — OXYCODONE-ACETAMINOPHEN 5-325 MG PO TABS: 1.0000 | ORAL_TABLET | Freq: Four times a day (QID) | ORAL | Status: DC | PRN

## 2014-09-05 MED ORDER — LIDOCAINE HCL (CARDIAC) 20 MG/ML IV SOLN
INTRAVENOUS | Status: AC
  Filled 2014-09-05: qty 10

## 2014-09-05 MED ORDER — HYDROMORPHONE HCL 1 MG/ML IJ SOLN
0.5000 mg | INTRAMUSCULAR | Status: DC | PRN
  Administered 2014-09-05 (×2): 1 mg via INTRAVENOUS
  Filled 2014-09-05 (×2): qty 1

## 2014-09-05 MED ORDER — ONDANSETRON HCL 4 MG/2ML IJ SOLN
INTRAMUSCULAR | Status: AC
  Filled 2014-09-05: qty 2

## 2014-09-05 MED ORDER — LEVOTHYROXINE SODIUM 75 MCG PO TABS
75.0000 ug | ORAL_TABLET | Freq: Every day | ORAL | Status: DC
  Administered 2014-09-06 – 2014-09-07 (×2): 75 ug via ORAL
  Filled 2014-09-05 (×3): qty 1

## 2014-09-05 MED ORDER — ARTIFICIAL TEARS OP OINT
TOPICAL_OINTMENT | OPHTHALMIC | Status: DC | PRN
  Administered 2014-09-05: 1 via OPHTHALMIC

## 2014-09-05 MED ORDER — GLYCOPYRROLATE 0.2 MG/ML IJ SOLN
INTRAMUSCULAR | Status: AC
  Filled 2014-09-05: qty 2

## 2014-09-05 MED ORDER — LIDOCAINE HCL (CARDIAC) 20 MG/ML IV SOLN
INTRAVENOUS | Status: DC | PRN
  Administered 2014-09-05: 30 mg via INTRAVENOUS
  Administered 2014-09-05: 50 mg via INTRAVENOUS

## 2014-09-05 MED ORDER — KETOROLAC TROMETHAMINE 15 MG/ML IJ SOLN
7.5000 mg | Freq: Three times a day (TID) | INTRAMUSCULAR | Status: AC
  Administered 2014-09-05 (×2): 7.5 mg via INTRAVENOUS
  Filled 2014-09-05 (×2): qty 1

## 2014-09-05 MED ORDER — HYDROCHLOROTHIAZIDE 25 MG PO TABS
25.0000 mg | ORAL_TABLET | Freq: Every day | ORAL | Status: DC
  Administered 2014-09-05 – 2014-09-07 (×3): 25 mg via ORAL
  Filled 2014-09-05 (×4): qty 1

## 2014-09-05 MED ORDER — METHOCARBAMOL 500 MG PO TABS
500.0000 mg | ORAL_TABLET | Freq: Four times a day (QID) | ORAL | Status: DC | PRN
  Administered 2014-09-05 – 2014-09-07 (×4): 500 mg via ORAL
  Filled 2014-09-05 (×3): qty 1

## 2014-09-05 MED ORDER — CEFAZOLIN SODIUM-DEXTROSE 2-3 GM-% IV SOLR
INTRAVENOUS | Status: AC
  Filled 2014-09-05: qty 50

## 2014-09-05 MED ORDER — BUPIVACAINE HCL (PF) 0.5 % IJ SOLN
INTRAMUSCULAR | Status: AC
  Filled 2014-09-05: qty 10

## 2014-09-05 MED ORDER — IRBESARTAN 150 MG PO TABS
150.0000 mg | ORAL_TABLET | Freq: Every day | ORAL | Status: DC
  Administered 2014-09-05 – 2014-09-07 (×3): 150 mg via ORAL
  Filled 2014-09-05 (×4): qty 1

## 2014-09-05 MED ORDER — METHOCARBAMOL 500 MG PO TABS
ORAL_TABLET | ORAL | Status: AC
  Administered 2014-09-05: 500 mg via ORAL
  Filled 2014-09-05: qty 1

## 2014-09-05 MED ORDER — BISACODYL 5 MG PO TBEC: 5.0000 mg | DELAYED_RELEASE_TABLET | Freq: Every day | ORAL | Status: DC | PRN

## 2014-09-05 MED ORDER — ROCURONIUM BROMIDE 50 MG/5ML IV SOLN
INTRAVENOUS | Status: AC
  Filled 2014-09-05: qty 1

## 2014-09-05 MED ORDER — LACTATED RINGERS IV SOLN
INTRAVENOUS | Status: DC | PRN
  Administered 2014-09-05 (×2): via INTRAVENOUS

## 2014-09-05 MED ORDER — 0.9 % SODIUM CHLORIDE (POUR BTL) OPTIME
TOPICAL | Status: DC | PRN
  Administered 2014-09-05: 1000 mL

## 2014-09-05 MED ORDER — ONDANSETRON HCL 4 MG/2ML IJ SOLN: 4.0000 mg | Freq: Once | INTRAMUSCULAR | Status: DC | PRN

## 2014-09-05 MED ORDER — FENTANYL CITRATE 0.05 MG/ML IJ SOLN
INTRAMUSCULAR | Status: DC | PRN
  Administered 2014-09-05 (×2): 100 ug via INTRAVENOUS
  Administered 2014-09-05: 50 ug via INTRAVENOUS

## 2014-09-05 MED ORDER — ROCURONIUM BROMIDE 100 MG/10ML IV SOLN
INTRAVENOUS | Status: DC | PRN
  Administered 2014-09-05: 40 mg via INTRAVENOUS

## 2014-09-05 MED ORDER — DEXAMETHASONE SODIUM PHOSPHATE 4 MG/ML IJ SOLN
INTRAMUSCULAR | Status: AC
  Filled 2014-09-05: qty 1

## 2014-09-05 MED ORDER — FENTANYL CITRATE 0.05 MG/ML IJ SOLN
INTRAMUSCULAR | Status: AC
  Filled 2014-09-05: qty 5

## 2014-09-05 MED ORDER — DEXTROSE 5 % IV SOLN
500.0000 mg | Freq: Four times a day (QID) | INTRAVENOUS | Status: DC | PRN
  Filled 2014-09-05: qty 5

## 2014-09-05 MED ORDER — ACETAMINOPHEN 325 MG PO TABS: 650.0000 mg | ORAL_TABLET | Freq: Four times a day (QID) | ORAL | Status: DC | PRN

## 2014-09-05 MED ORDER — ASPIRIN EC 325 MG PO TBEC
325.0000 mg | DELAYED_RELEASE_TABLET | Freq: Two times a day (BID) | ORAL | Status: DC
  Administered 2014-09-05 – 2014-09-07 (×4): 325 mg via ORAL
  Filled 2014-09-05 (×5): qty 1

## 2014-09-05 MED ORDER — HYDROMORPHONE HCL 1 MG/ML IJ SOLN
INTRAMUSCULAR | Status: AC
  Filled 2014-09-05: qty 1

## 2014-09-05 MED ORDER — ONDANSETRON HCL 4 MG/2ML IJ SOLN
INTRAMUSCULAR | Status: DC | PRN
  Administered 2014-09-05: 4 mg via INTRAVENOUS

## 2014-09-05 MED ORDER — PROPOFOL 10 MG/ML IV BOLUS
INTRAVENOUS | Status: DC | PRN
  Administered 2014-09-05: 200 mg via INTRAVENOUS

## 2014-09-05 MED ORDER — ONDANSETRON HCL 4 MG PO TABS
4.0000 mg | ORAL_TABLET | Freq: Four times a day (QID) | ORAL | Status: DC | PRN
  Administered 2014-09-07: 4 mg via ORAL
  Filled 2014-09-05: qty 1

## 2014-09-05 MED ORDER — TRANEXAMIC ACID 100 MG/ML IV SOLN
1000.0000 mg | INTRAVENOUS | Status: AC
  Administered 2014-09-05: 1000 mg via INTRAVENOUS
  Filled 2014-09-05: qty 10

## 2014-09-05 MED ORDER — LIDOCAINE HCL 4 % MT SOLN
OROMUCOSAL | Status: DC | PRN
  Administered 2014-09-05: 3 mL via TOPICAL

## 2014-09-05 MED ORDER — GLYCOPYRROLATE 0.2 MG/ML IJ SOLN
INTRAMUSCULAR | Status: DC | PRN
  Administered 2014-09-05: 0.4 mg via INTRAVENOUS

## 2014-09-05 MED ORDER — STERILE WATER FOR INJECTION IJ SOLN
INTRAMUSCULAR | Status: AC
  Filled 2014-09-05: qty 10

## 2014-09-05 MED ORDER — POLYETHYLENE GLYCOL 3350 17 G PO PACK: 17.0000 g | PACK | Freq: Every day | ORAL | Status: DC | PRN

## 2014-09-05 MED ORDER — ALUM & MAG HYDROXIDE-SIMETH 200-200-20 MG/5ML PO SUSP: 30.0000 mL | ORAL | Status: DC | PRN

## 2014-09-05 MED ORDER — CEFAZOLIN SODIUM-DEXTROSE 2-3 GM-% IV SOLR
2.0000 g | Freq: Four times a day (QID) | INTRAVENOUS | Status: AC
  Administered 2014-09-05 (×2): 2 g via INTRAVENOUS
  Filled 2014-09-05 (×3): qty 50

## 2014-09-05 MED ORDER — METHOCARBAMOL 500 MG PO TABS: 500.0000 mg | ORAL_TABLET | Freq: Three times a day (TID) | ORAL | Status: DC | PRN

## 2014-09-05 MED ORDER — KETOROLAC TROMETHAMINE 15 MG/ML IJ SOLN
INTRAMUSCULAR | Status: AC
  Administered 2014-09-05: 7.5 mg via INTRAVENOUS
  Filled 2014-09-05: qty 1

## 2014-09-05 MED ORDER — BUPIVACAINE HCL (PF) 0.5 % IJ SOLN
INTRAMUSCULAR | Status: DC | PRN
  Administered 2014-09-05: 20 mL

## 2014-09-05 MED ORDER — ONDANSETRON HCL 4 MG/2ML IJ SOLN: 4.0000 mg | Freq: Four times a day (QID) | INTRAMUSCULAR | Status: DC | PRN

## 2014-09-05 MED ORDER — ACETAMINOPHEN 650 MG RE SUPP: 650.0000 mg | Freq: Four times a day (QID) | RECTAL | Status: DC | PRN

## 2014-09-05 MED ORDER — HYDROMORPHONE HCL 1 MG/ML IJ SOLN
INTRAMUSCULAR | Status: AC
  Administered 2014-09-05: 0.5 mg via INTRAVENOUS
  Filled 2014-09-05: qty 1

## 2014-09-05 MED ORDER — MIDAZOLAM HCL 2 MG/2ML IJ SOLN
INTRAMUSCULAR | Status: AC
  Filled 2014-09-05: qty 2

## 2014-09-05 MED ORDER — PROPOFOL 10 MG/ML IV BOLUS
INTRAVENOUS | Status: AC
  Filled 2014-09-05: qty 20

## 2014-09-05 MED ORDER — HYDROMORPHONE HCL 1 MG/ML IJ SOLN
0.2500 mg | INTRAMUSCULAR | Status: DC | PRN
  Administered 2014-09-05 (×2): 0.5 mg via INTRAVENOUS

## 2014-09-05 MED ORDER — EPHEDRINE SULFATE 50 MG/ML IJ SOLN
INTRAMUSCULAR | Status: DC | PRN
  Administered 2014-09-05: 5 mg via INTRAVENOUS
  Administered 2014-09-05: 10 mg via INTRAVENOUS
  Administered 2014-09-05: 5 mg via INTRAVENOUS

## 2014-09-05 MED ORDER — ASPIRIN EC 325 MG PO TBEC
325.0000 mg | DELAYED_RELEASE_TABLET | Freq: Two times a day (BID) | ORAL | Status: DC
Start: 2014-09-05 — End: 2017-11-18

## 2014-09-05 MED ORDER — DOCUSATE SODIUM 100 MG PO CAPS
100.0000 mg | ORAL_CAPSULE | Freq: Two times a day (BID) | ORAL | Status: DC
  Administered 2014-09-05 – 2014-09-07 (×5): 100 mg via ORAL
  Filled 2014-09-05 (×7): qty 1

## 2014-09-05 MED ORDER — DIPHENHYDRAMINE HCL 12.5 MG/5ML PO ELIX: 12.5000 mg | ORAL_SOLUTION | ORAL | Status: DC | PRN

## 2014-09-05 MED ORDER — SERTRALINE HCL 100 MG PO TABS
100.0000 mg | ORAL_TABLET | Freq: Every day | ORAL | Status: DC
  Administered 2014-09-06 – 2014-09-07 (×2): 100 mg via ORAL
  Filled 2014-09-05 (×3): qty 1

## 2014-09-05 MED ORDER — OXYCODONE-ACETAMINOPHEN 5-325 MG PO TABS
1.0000 | ORAL_TABLET | ORAL | Status: DC | PRN
  Administered 2014-09-05: 2 via ORAL
  Administered 2014-09-05: 1 via ORAL
  Administered 2014-09-06 – 2014-09-07 (×5): 2 via ORAL
  Filled 2014-09-05 (×6): qty 2

## 2014-09-05 SURGICAL SUPPLY — 60 items
APL SKNCLS STERI-STRIP NONHPOA (GAUZE/BANDAGES/DRESSINGS) ×1
BANDAGE ESMARK 6X9 LF (GAUZE/BANDAGES/DRESSINGS) ×1 IMPLANT
BENZOIN TINCTURE PRP APPL 2/3 (GAUZE/BANDAGES/DRESSINGS) ×2 IMPLANT
BLADE SAGITTAL 25.0X1.19X90 (BLADE) ×2 IMPLANT
BLADE SAW SAG 90X13X1.27 (BLADE) ×2 IMPLANT
BNDG ESMARK 6X9 LF (GAUZE/BANDAGES/DRESSINGS) ×2
BOWL SMART MIX CTS (DISPOSABLE) ×2 IMPLANT
CAP KNEE TOTAL 3 SIGMA ×2 IMPLANT
CEMENT HV SMART SET (Cement) ×4 IMPLANT
CLSR STERI-STRIP ANTIMIC 1/2X4 (GAUZE/BANDAGES/DRESSINGS) ×2 IMPLANT
COVER SURGICAL LIGHT HANDLE (MISCELLANEOUS) ×2 IMPLANT
CUFF TOURNIQUET SINGLE 34IN LL (TOURNIQUET CUFF) ×2 IMPLANT
CUFF TOURNIQUET SINGLE 44IN (TOURNIQUET CUFF) IMPLANT
DRAPE EXTREMITY T 121X128X90 (DRAPE) ×2 IMPLANT
DRAPE IMP U-DRAPE 54X76 (DRAPES) ×2 IMPLANT
DRAPE U-SHAPE 47X51 STRL (DRAPES) ×2 IMPLANT
DURAPREP 26ML APPLICATOR (WOUND CARE) ×2 IMPLANT
ELECT REM PT RETURN 9FT ADLT (ELECTROSURGICAL) ×2
ELECTRODE REM PT RTRN 9FT ADLT (ELECTROSURGICAL) ×1 IMPLANT
EVACUATOR 1/8 PVC DRAIN (DRAIN) ×2 IMPLANT
FACESHIELD WRAPAROUND (MASK) ×2 IMPLANT
GAUZE SPONGE 4X4 12PLY STRL (GAUZE/BANDAGES/DRESSINGS) ×2 IMPLANT
GAUZE XEROFORM 5X9 LF (GAUZE/BANDAGES/DRESSINGS) ×2 IMPLANT
GLOVE BIOGEL PI IND STRL 8 (GLOVE) ×2 IMPLANT
GLOVE BIOGEL PI INDICATOR 8 (GLOVE) ×2
GLOVE ECLIPSE 7.5 STRL STRAW (GLOVE) ×4 IMPLANT
GOWN STRL REUS W/ TWL LRG LVL3 (GOWN DISPOSABLE) ×1 IMPLANT
GOWN STRL REUS W/ TWL XL LVL3 (GOWN DISPOSABLE) ×2 IMPLANT
GOWN STRL REUS W/TWL LRG LVL3 (GOWN DISPOSABLE) ×2
GOWN STRL REUS W/TWL XL LVL3 (GOWN DISPOSABLE) ×2
HANDPIECE INTERPULSE COAX TIP (DISPOSABLE) ×2
HOOD PEEL AWAY FACE SHEILD DIS (HOOD) ×6 IMPLANT
IMMOBILIZER KNEE 20 (SOFTGOODS) ×2 IMPLANT
IMMOBILIZER KNEE 22 UNIV (SOFTGOODS) IMPLANT
KIT BASIN OR (CUSTOM PROCEDURE TRAY) ×2 IMPLANT
KIT ROOM TURNOVER OR (KITS) ×2 IMPLANT
MANIFOLD NEPTUNE II (INSTRUMENTS) ×2 IMPLANT
NEEDLE SPNL 22GX3.5 QUINCKE BK (NEEDLE) ×2 IMPLANT
NS IRRIG 1000ML POUR BTL (IV SOLUTION) ×2 IMPLANT
PACK TOTAL JOINT (CUSTOM PROCEDURE TRAY) ×2 IMPLANT
PACK UNIVERSAL I (CUSTOM PROCEDURE TRAY) ×2 IMPLANT
PAD ARMBOARD 7.5X6 YLW CONV (MISCELLANEOUS) ×4 IMPLANT
PAD CAST 4YDX4 CTTN HI CHSV (CAST SUPPLIES) ×1 IMPLANT
PADDING CAST ABS 6INX4YD NS (CAST SUPPLIES) ×1
PADDING CAST ABS COTTON 6X4 NS (CAST SUPPLIES) ×1 IMPLANT
PADDING CAST COTTON 4X4 STRL (CAST SUPPLIES) ×2
SET HNDPC FAN SPRY TIP SCT (DISPOSABLE) ×1 IMPLANT
SPONGE GAUZE 4X4 12PLY STER LF (GAUZE/BANDAGES/DRESSINGS) ×2 IMPLANT
STAPLER VISISTAT 35W (STAPLE) IMPLANT
STRIP CLOSURE SKIN 1/2X4 (GAUZE/BANDAGES/DRESSINGS) ×2 IMPLANT
SUCTION FRAZIER TIP 10 FR DISP (SUCTIONS) ×2 IMPLANT
SUT MNCRL AB 3-0 PS2 18 (SUTURE) IMPLANT
SUT VIC AB 0 CTB1 27 (SUTURE) ×4 IMPLANT
SUT VIC AB 1 CT1 27 (SUTURE) ×4
SUT VIC AB 1 CT1 27XBRD ANBCTR (SUTURE) ×2 IMPLANT
SUT VIC AB 2-0 CTB1 (SUTURE) ×4 IMPLANT
SYR 50ML LL SCALE MARK (SYRINGE) ×2 IMPLANT
TOWEL OR 17X24 6PK STRL BLUE (TOWEL DISPOSABLE) ×2 IMPLANT
TOWEL OR 17X26 10 PK STRL BLUE (TOWEL DISPOSABLE) ×2 IMPLANT
TRAY FOLEY CATH 16FRSI W/METER (SET/KITS/TRAYS/PACK) IMPLANT

## 2014-09-05 NOTE — Anesthesia Postprocedure Evaluation (Signed)
  Anesthesia Post-op Note  Patient: Connie BombardJudith D [name]  Procedure(s) Performed: Procedure(s): TOTAL KNEE ARTHROPLASTY (Right)  Patient Location: PACU  Anesthesia Type:General  Level of Consciousness: awake, alert , oriented and patient cooperative  Airway and Oxygen Therapy: Patient Spontanous Breathing  Post-op Pain: mild  Post-op Assessment: Post-op Vital signs reviewed, Patient's Cardiovascular Status Stable, Respiratory Function Stable, Patent Airway, No signs of Nausea or vomiting and Pain level controlled  Post-op Vital Signs: stable  Last Vitals:  Filed Vitals:   09/05/14 1028  BP: 143/53  Pulse: 65  Temp: 36.7 C  Resp: 20    Complications: No apparent anesthesia complications

## 2014-09-05 NOTE — Brief Op Note (Signed)
09/05/2014  9:05 AM  PATIENT:  Connie [name]  74 y.o. female  PRE-OPERATIVE DIAGNOSIS:  DEGENERATIVE JOINT DISEASE  POST-OPERATIVE DIAGNOSIS:  same  PROCEDURE:  Procedure(s): TOTAL KNEE ARTHROPLASTY (Right)  SURGEON:  Surgeon(s) and Role:    * Harvie JuniorJohn L Kanyah Matsushima, MD - Primary  PHYSICIAN ASSISTANT:   ASSISTANTS: bethune   ANESTHESIA:   general  EBL:  Total I/O In: 1000 [I.V.:1000] Out: -   BLOOD ADMINISTERED:none  DRAINS: (1 med) Hemovact drain(s) in the r knee with  Suction Open   LOCAL MEDICATIONS USED:  OTHER experel  SPECIMEN:  No Specimen  DISPOSITION OF SPECIMEN:  N/A  COUNTS:  YES  TOURNIQUET:   Total Tourniquet Time Documented: Thigh (Right) - 56 minutes Total: Thigh (Right) - 56 minutes   DICTATION: .Other Dictation: Dictation Number [id number]  PLAN OF CARE: Admit to inpatient   PATIENT DISPOSITION:  PACU - hemodynamically stable.   Delay start of Pharmacological VTE agent (>24hrs) due to surgical blood loss or risk of bleeding: no

## 2014-09-05 NOTE — Progress Notes (Signed)
Utilization review completed.  

## 2014-09-05 NOTE — Anesthesia Preprocedure Evaluation (Signed)
Anesthesia Evaluation  Patient identified by MRN, date of birth, ID band Patient awake    Reviewed: Allergy & Precautions, NPO status , Patient's Chart, lab work & pertinent test results  Airway        Dental   Pulmonary          Cardiovascular hypertension,     Neuro/Psych Depression    GI/Hepatic hiatal hernia, GERD-  ,  Endo/Other  Hypothyroidism   Renal/GU      Musculoskeletal  (+) Arthritis -,   Abdominal   Peds  Hematology   Anesthesia Other Findings   Reproductive/Obstetrics                             Anesthesia Physical Anesthesia Plan  ASA: II  Anesthesia Plan: General   Post-op Pain Management:    Induction: Intravenous  Airway Management Planned: Oral ETT and LMA  Additional Equipment:   Intra-op Plan:   Post-operative Plan: Extubation in OR  Informed Consent: I have reviewed the patients History and Physical, chart, labs and discussed the procedure including the risks, benefits and alternatives for the proposed anesthesia with the patient or authorized representative who has indicated his/her understanding and acceptance.     Plan Discussed with: Surgeon, Anesthesiologist and CRNA  Anesthesia Plan Comments:         Anesthesia Quick Evaluation

## 2014-09-05 NOTE — H&P (Signed)
TOTAL KNEE ADMISSION H&P  Patient is being admitted for right total knee arthroplasty.  Subjective:  Chief Complaint:right knee pain.  HPI: Connie [name], 74 y.o. female, has a history of pain and functional disability in the right knee due to arthritis and has failed non-surgical conservative treatments for greater than 12 weeks to includeNSAID's and/or analgesics, corticosteriod injections, viscosupplementation injections, flexibility and strengthening excercises, supervised PT with diminished ADL's post treatment, use of assistive devices, weight reduction as appropriate and activity modification.  Onset of symptoms was gradual, starting 8 years ago with gradually worsening course since that time. The patient noted no past surgery on the right knee(s).  Patient currently rates pain in the right knee(s) at 9 out of 10 with activity. Patient has night pain, worsening of pain with activity and weight bearing, pain that interferes with activities of daily living, pain with passive range of motion, crepitus and joint swelling.  Patient has evidence of subchondral sclerosis, periarticular osteophytes and joint space narrowing by imaging studies. This patient has had failure of all reasonable conservative care. There is no active infection.  There are no active problems to display for this patient.  Past Medical History  Diagnosis Date  . Hypertension   . Sinus drainage   . Hypothyroidism   . Depression   . GERD (gastroesophageal reflux disease)   . History of hiatal hernia   . Arthritis     Past Surgical History  Procedure Laterality Date  . Back surgery  2000    Dr Sherwood Gambler  . Bladder tack  2005  . Rectal prolapse repair  2008  . Tonsillectomy      as a child  . Dilation and curettage of uterus      after miscarriage  . Tubal ligation  1972  . Colonoscopy      Prescriptions prior to admission  Medication Sig Dispense Refill Last Dose  . esomeprazole (NEXIUM) 20 MG capsule Take 20  mg by mouth daily at 12 noon.   09/05/2014 at 0400  . hydrochlorothiazide (HYDRODIURIL) 25 MG tablet Take 25 mg by mouth daily.   09/04/2014 at Unknown time  . irbesartan (AVAPRO) 150 MG tablet Take 150 mg by mouth daily.   09/04/2014 at Unknown time  . levothyroxine (SYNTHROID, LEVOTHROID) 75 MCG tablet Take 75 mcg by mouth daily before breakfast.   09/05/2014 at 0400  . lovastatin (MEVACOR) 40 MG tablet Take 40 mg by mouth daily.   09/04/2014 at Unknown time  . Naproxen Sodium 220 MG CAPS Take 2 capsules by mouth daily as needed (pain).   Past Month at Unknown time  . sertraline (ZOLOFT) 100 MG tablet Take 100 mg by mouth daily.   09/05/2014 at 0400   Allergies  Allergen Reactions  . Penicillins Rash  . Sulfa Antibiotics Rash    History  Substance Use Topics  . Smoking status: Never Smoker   . Smokeless tobacco: Never Used  . Alcohol Use: Yes     Comment: rarely wine    History reviewed. No pertinent family history.   ROS ROS: I have reviewed the patient's review of systems thoroughly and there are no positive responses as relates to the HPI.  Objective:  Physical Exam  Vital signs in last 24 hours: Temp:  [97.9 F (36.6 C)] 97.9 F (36.6 C) (02/12 0601) Pulse Rate:  [99] 99 (02/12 0601) Resp:  [18] 18 (02/12 0601) BP: (147)/(66) 147/66 mmHg (02/12 0601) SpO2:  [97 %] 97 % (02/12 0601) Well-developed  well-nourished patient in no acute distress. Alert and oriented x3 HEENT:within normal limits Cardiac: Regular rate and rhythm Pulmonary: Lungs clear to auscultation Abdomen: Soft and nontender.  Normal active bowel sounds  Musculoskeletal: (right knee: Painful range of motion.  No instability.  Lateral joint line tenderness.  Grinding through range of motion.  Labs: Recent Results (from the past 2160 hour(s))  Surgical pcr screen     Status: None   Collection Time: 08/25/14  9:48 AM  Result Value Ref Range   MRSA, PCR NEGATIVE NEGATIVE   Staphylococcus aureus NEGATIVE  NEGATIVE    Comment:        The Xpert SA Assay (FDA approved for NASAL specimens in patients over 43 years of age), is one component of a comprehensive surveillance program.  Test performance has been validated by [facility] for patients greater than or equal to 9 year old. It is not intended to diagnose infection nor to guide or monitor treatment.   APTT     Status: None   Collection Time: 08/25/14  9:48 AM  Result Value Ref Range   aPTT 35 24 - 37 seconds  CBC WITH DIFFERENTIAL     Status: None   Collection Time: 08/25/14  9:48 AM  Result Value Ref Range   WBC 9.8 4.0 - 10.5 K/uL   RBC 4.64 3.87 - 5.11 MIL/uL   Hemoglobin 13.3 12.0 - 15.0 g/dL   HCT 40.0 36.0 - 46.0 %   MCV 86.2 78.0 - 100.0 fL   MCH 28.7 26.0 - 34.0 pg   MCHC 33.3 30.0 - 36.0 g/dL   RDW 13.0 11.5 - 15.5 %   Platelets 353 150 - 400 K/uL   Neutrophils Relative % 74 43 - 77 %   Neutro Abs 7.3 1.7 - 7.7 K/uL   Lymphocytes Relative 14 12 - 46 %   Lymphs Abs 1.3 0.7 - 4.0 K/uL   Monocytes Relative 10 3 - 12 %   Monocytes Absolute 1.0 0.1 - 1.0 K/uL   Eosinophils Relative 1 0 - 5 %   Eosinophils Absolute 0.1 0.0 - 0.7 K/uL   Basophils Relative 1 0 - 1 %   Basophils Absolute 0.1 0.0 - 0.1 K/uL  Comprehensive metabolic panel     Status: Abnormal   Collection Time: 08/25/14  9:48 AM  Result Value Ref Range   Sodium 141 135 - 145 mmol/L   Potassium 4.4 3.5 - 5.1 mmol/L   Chloride 104 96 - 112 mmol/L   CO2 24 19 - 32 mmol/L   Glucose, Bld 105 (H) 70 - 99 mg/dL   BUN 15 6 - 23 mg/dL   Creatinine, Ser 0.84 0.50 - 1.10 mg/dL   Calcium 10.0 8.4 - 10.5 mg/dL   Total Protein 7.0 6.0 - 8.3 g/dL   Albumin 4.1 3.5 - 5.2 g/dL   AST 25 0 - 37 U/L   ALT 13 0 - 35 U/L   Alkaline Phosphatase 102 39 - 117 U/L   Total Bilirubin 0.5 0.3 - 1.2 mg/dL   GFR calc non Af Amer 66 (L) >90 mL/min   GFR calc Af Amer 77 (L) >90 mL/min    Comment: (NOTE) The eGFR has been calculated using the CKD EPI equation. This  calculation has not been validated in all clinical situations. eGFR's persistently <90 mL/min signify possible Chronic Kidney Disease.    Anion gap 13 5 - 15  Protime-INR     Status: None   Collection Time:  08/25/14  9:48 AM  Result Value Ref Range   Prothrombin Time 13.3 11.6 - 15.2 seconds   INR 1.00 0.00 - 1.49  Urinalysis, Routine w reflex microscopic     Status: None   Collection Time: 08/25/14  9:48 AM  Result Value Ref Range   Color, Urine YELLOW YELLOW   APPearance CLEAR CLEAR   Specific Gravity, Urine 1.010 1.005 - 1.030   pH 6.0 5.0 - 8.0   Glucose, UA NEGATIVE NEGATIVE mg/dL   Hgb urine dipstick NEGATIVE NEGATIVE   Bilirubin Urine NEGATIVE NEGATIVE   Ketones, ur NEGATIVE NEGATIVE mg/dL   Protein, ur NEGATIVE NEGATIVE mg/dL   Urobilinogen, UA 0.2 0.0 - 1.0 mg/dL   Nitrite NEGATIVE NEGATIVE   Leukocytes, UA NEGATIVE NEGATIVE    Comment: MICROSCOPIC NOT DONE ON URINES WITH NEGATIVE PROTEIN, BLOOD, LEUKOCYTES, NITRITE, OR GLUCOSE <1000 mg/dL.  Type and screen     Status: None   Collection Time: 08/25/14  9:51 AM  Result Value Ref Range   ABO/RH(D) O POS    Antibody Screen NEG    Sample Expiration 09/08/2014   ABO/Rh     Status: None   Collection Time: 08/25/14  9:51 AM  Result Value Ref Range   ABO/RH(D) O POS     There is no weight on file to calculate BMI.   Imaging Review Plain radiographs demonstrate severe degenerative joint disease of the right knee(s). The overall alignment ismild valgus. The bone quality appears to be good for age and reported activity level.  Assessment/Plan:  End stage arthritis, right knee   The patient history, physical examination, clinical judgment of the provider and imaging studies are consistent with end stage degenerative joint disease of the right knee(s) and total knee arthroplasty is deemed medically necessary. The treatment options including medical management, injection therapy arthroscopy and arthroplasty were discussed  at length. The risks and benefits of total knee arthroplasty were presented and reviewed. The risks due to aseptic loosening, infection, stiffness, patella tracking problems, thromboembolic complications and other imponderables were discussed. The patient acknowledged the explanation, agreed to proceed with the plan and consent was signed. Patient is being admitted for inpatient treatment for surgery, pain control, PT, OT, prophylactic antibiotics, VTE prophylaxis, progressive ambulation and ADL's and discharge planning. The patient is planning to be discharged home with home health services

## 2014-09-05 NOTE — Evaluation (Signed)
Physical Therapy Evaluation Patient Details Name: Connie BombardJudith D Billey MRN: [id number] DOB: [birth year] Today's Date: 09/05/2014   History of Present Illness  pt is a 74 y.o. female s/p Rt TKA.   Clinical Impression  Pt is s/p Rt TKA POD#0 resulting in the deficits listed below (see PT Problem List). Pt will benefit from skilled PT to increase their independence and safety with mobility to allow discharge to the venue listed below. Pt very motivated to D/C over weekend. Anticipate good progress.      Follow Up Recommendations Home health PT;Supervision/Assistance - 24 hour    Equipment Recommendations  None recommended by PT    Recommendations for Other Services OT consult     Precautions / Restrictions Precautions Precautions: Knee Precaution Comments: given HEP handout and educated on no pillow under knee  Required Braces or Orthoses: Knee Immobilizer - Right Knee Immobilizer - Right: On when out of bed or walking Restrictions Weight Bearing Restrictions: No Other Position/Activity Restrictions: WBAT      Mobility  Bed Mobility Overal bed mobility: Needs Assistance Bed Mobility: Supine to Sit     Supine to sit: Supervision;HOB elevated     General bed mobility comments: min cues for technique; using handrails   Transfers Overall transfer level: Needs assistance Equipment used: Rolling walker (2 wheeled) Transfers: Sit to/from Stand Sit to Stand: Min guard         General transfer comment: cues for hand placement and safety with RW  Ambulation/Gait Ambulation/Gait assistance: Min guard Ambulation Distance (Feet): 12 Feet Assistive device: Rolling walker (2 wheeled) Gait Pattern/deviations: Step-to pattern;Decreased stance time - right;Decreased step length - left;Antalgic;Shuffle;Wide base of support Gait velocity: decr Gait velocity interpretation: Below normal speed for age/gender General Gait Details: cues for step through gt and RW management; min guard with  directional changes   Stairs            Wheelchair Mobility    Modified Rankin (Stroke Patients Only)       Balance Overall balance assessment: Needs assistance Sitting-balance support: Feet supported;No upper extremity supported Sitting balance-Leahy Scale: Fair Sitting balance - Comments: denied any dizziness   Standing balance support: During functional activity;Bilateral upper extremity supported Standing balance-Leahy Scale: Poor Standing balance comment: RW to balance                              Pertinent Vitals/Pain Pain Assessment: 0-10 Pain Score:  ("1.5") Pain Location: Rt knee Pain Descriptors / Indicators: Aching Pain Intervention(s): Monitored during session;Premedicated before session;Repositioned;Ice applied    Home Living Family/patient expects to be discharged to:: Private residence Living Arrangements: Spouse/significant other Available Help at Discharge: Family;Available 24 hours/day Type of Home: House Home Access: Stairs to enter Entrance Stairs-Rails: Left Entrance Stairs-Number of Steps: 2 Home Layout: One level Home Equipment: Walker - 2 wheels;Bedside commode;Shower seat Additional Comments: pt has tub shower     Prior Function Level of Independence: Independent               Hand Dominance        Extremity/Trunk Assessment   Upper Extremity Assessment: Defer to OT evaluation           Lower Extremity Assessment: RLE deficits/detail RLE Deficits / Details: Rt knee 0 to 70 degrees in sitting    Cervical / Trunk Assessment: Normal  Communication   Communication: No difficulties  Cognition Arousal/Alertness: Awake/alert Behavior During Therapy: WFL for tasks assessed/performed Overall  Cognitive Status: Within Functional Limits for tasks assessed                      General Comments General comments (skin integrity, edema, etc.): encouraged OOB activity with nursing as tolerated    Exercises  Total Joint Exercises Ankle Circles/Pumps: AROM;Both;15 reps;Supine Quad Sets: AROM;Right;10 reps;Seated Long Arc Quad: AROM;Right;10 reps;Seated      Assessment/Plan    PT Assessment Patient needs continued PT services  PT Diagnosis Difficulty walking;Generalized weakness;Acute pain   PT Problem List Decreased strength;Decreased range of motion;Decreased activity tolerance;Decreased balance;Decreased mobility;Decreased knowledge of use of DME;Pain  PT Treatment Interventions DME instruction;Gait training;Stair training;Functional mobility training;Therapeutic exercise;Therapeutic activities;Balance training;Neuromuscular re-education;Patient/family education   PT Goals (Current goals can be found in the Care Plan section) Acute Rehab PT Goals Patient Stated Goal: to go home by sunday PT Goal Formulation: With patient Time For Goal Achievement: 09/12/14 Potential to Achieve Goals: Good    Frequency 7X/week   Barriers to discharge        Co-evaluation               End of Session Equipment Utilized During Treatment: Gait belt;Right knee immobilizer Activity Tolerance: Patient tolerated treatment well Patient left: in chair;with call bell/phone within reach;with family/visitor present Nurse Communication: Mobility status         Time: 1342-1406 PT Time Calculation (min) (ACUTE ONLY): 24 min   Charges:   PT Evaluation $Initial PT Evaluation Tier I: 1 Procedure PT Treatments $Gait Training: 8-22 mins   PT G CodesDonell Sievert, [place]  [phone] 09/05/2014, 2:15 PM

## 2014-09-05 NOTE — Progress Notes (Signed)
Orthopedic Tech Progress Note Patient Details:  Penni BombardJudith D Mich 1940-11-28 [id number]  CPM Right Knee CPM Right Knee: On Right Knee Flexion (Degrees): 90 Right Knee Extension (Degrees): 0 Additional Comments: trapeze bar patient helper Viewed order from doctor's order list  Nikki DomCrawford, Jolon Degante 09/05/2014, 9:56 AM

## 2014-09-05 NOTE — Transfer of Care (Signed)
Immediate Anesthesia Transfer of Care Note  Patient: Connie [name]  Procedure(s) Performed: Procedure(s): TOTAL KNEE ARTHROPLASTY (Right)  Patient Location: PACU  Anesthesia Type:General  Level of Consciousness: awake, patient cooperative and responds to stimulation  Airway & Oxygen Therapy: Patient Spontanous Breathing and Patient connected to nasal cannula oxygen  Post-op Assessment: Report given to RN and Post -op Vital signs reviewed and stable  Post vital signs: Reviewed and stable  Last Vitals:  Filed Vitals:   09/05/14 0601  BP: 147/66  Pulse: 99  Temp: 36.6 C  Resp: 18    Complications: No apparent anesthesia complications

## 2014-09-05 NOTE — Discharge Instructions (Signed)
Total Knee Replacement, Care After °Refer to this sheet in the next few weeks. These instructions provide you with information on caring for yourself after your procedure. Your health care provider also may give you specific instructions. Your treatment has been planned according to the most current medical practices, but problems sometimes occur. Call your health care provider if you have any problems or questions after your procedure. °HOME CARE INSTRUCTIONS  °· See a physical therapist as directed by your health care provider. °· Take medicines only as directed by your health care provider. °· Avoid lifting or driving until you are instructed otherwise. °· If you have been sent home with a continuous passive motion machine, use it as directed by your health care provider. °SEEK MEDICAL CARE IF: °· You have difficulty breathing. °· You have drainage, redness, swelling, or pain at your incision site. °· You have a bad smell coming from your incision site. °· You have persistent bleeding from your incision site. °· Your incision breaks open after sutures (stitches) or staples have been removed. °· You have a fever. °SEEK IMMEDIATE MEDICAL CARE IF:  °· You have a rash. °· You have pain or swelling in your calf or thigh. °· You have shortness of breath or chest pain. °· Your range of motion in your knee is decreasing rather than increasing. °MAKE SURE YOU:  °· Understand these instructions. °· Will watch your condition. °· Will get help right away if you are not doing well or get worse. °Document Released: 01/28/2005 Document Revised: 11/25/2013 Document Reviewed: 08/30/2011 °[employer]® Patient Information ©2015 [employer]. This information is not intended to replace advice given to you by your health care provider. Make sure you discuss any questions you have with your health care provider. ° °

## 2014-09-06 LAB — BASIC METABOLIC PANEL
ANION GAP: 7 (ref 5–15)
BUN: 13 mg/dL (ref 6–23)
CALCIUM: 8.4 mg/dL (ref 8.4–10.5)
CO2: 28 mmol/L (ref 19–32)
Chloride: 97 mmol/L (ref 96–112)
Creatinine, Ser: 1.03 mg/dL (ref 0.50–1.10)
GFR calc Af Amer: 60 mL/min — ABNORMAL LOW (ref >90)
GFR calc non Af Amer: 52 mL/min — ABNORMAL LOW (ref >90)
Glucose, Bld: 115 mg/dL — ABNORMAL HIGH (ref 70–99)
Potassium: 4.1 mmol/L (ref 3.5–5.1)
Sodium: 132 mmol/L — ABNORMAL LOW (ref 135–145)

## 2014-09-06 LAB — CBC
HEMATOCRIT: 32.2 % — AB (ref 36.0–46.0)
Hemoglobin: 10.4 g/dL — ABNORMAL LOW (ref 12.0–15.0)
MCH: 27.9 pg (ref 26.0–34.0)
MCHC: 32.3 g/dL (ref 30.0–36.0)
MCV: 86.3 fL (ref 78.0–100.0)
PLATELETS: 300 10*3/uL (ref 150–400)
RBC: 3.73 MIL/uL — AB (ref 3.87–5.11)
RDW: 13.1 % (ref 11.5–15.5)
WBC: 13.8 10*3/uL — AB (ref 4.0–10.5)

## 2014-09-06 NOTE — Progress Notes (Signed)
Subjective: 1 Day Post-Op Procedure(s) (LRB): TOTAL KNEE ARTHROPLASTY (Right) Patient reports pain as moderate.  Overall doing well. Taking by mouth and voiding okay. She was up with physical therapy yesterday afternoon.  Objective: Vital signs in last 24 hours: Temp:  [97.9 F (36.6 C)-99 F (37.2 C)] 99 F (37.2 C) (02/13 0541) Pulse Rate:  [65-93] 80 (02/13 0541) Resp:  [12-20] 20 (02/13 0541) BP: (104-150)/(41-65) 118/46 mmHg (02/13 0541) SpO2:  [90 %-100 %] 94 % (02/13 0541)  Intake/Output from previous day: 02/12 0701 - 02/13 0700 In: 3233.3 [P.O.:1090; I.V.:1968.3] Out: 150 [Drains:150] Intake/Output this shift: Total I/O In: 103.3 [I.V.:103.3] Out: -    Recent Labs  09/06/14 0530  HGB 10.4*    Recent Labs  09/06/14 0530  WBC 13.8*  RBC 3.73*  HCT 32.2*  PLT 300    Recent Labs  09/06/14 0530  NA 132*  K 4.1  CL 97  CO2 28  BUN 13  CREATININE 1.03  GLUCOSE 115*  CALCIUM 8.4   No results for input(s): LABPT, INR in the last 72 hours. Right knee exam: Neurovascular intact Sensation intact distally Intact pulses distally Dorsiflexion/Plantar flexion intact Incision: dressing C/D/I Compartment soft Hemovac drain intact. Assessment/Plan: 1 Day Post-Op Procedure(s) (LRB): TOTAL KNEE ARTHROPLASTY (Right) Plan: Hemovac drain pulled. Continue oral aspirin 325 mg twice daily with SCDs for DVT prophylaxis. Up with therapy Plan for discharge tomorrow  Matthew FolksBETHUNE,Esthefany Herrig G 09/06/2014, 8:43 AM

## 2014-09-06 NOTE — Progress Notes (Signed)
Physical Therapy Treatment Patient Details Name: Connie BombardJudith D [name] MRN: [id number] DOB: 1941-07-08 Today's Date: 09/06/2014    History of Present Illness pt is a 74 y.o. female s/p Rt TKA.     PT Comments    Patient and spouse present, pain well controlled.  Spouse training for transfers and ambulation using RW, gait belt and KI.  Patient strength in R quads improved, did not utilize KI for tx this afternoon, without adverse effects, but requested patient to continue use until MD clears officially.  Min guard sit to stand transfer, Supervision gait with RW, Min assist with cane (trial).  R knee AAROM 5-85 with soft/empty end feel.  Patient progressing very well, anticipate able to return home per tomorrow's plan.  Will maintain on PT schedule for continued treatment for strength, ROM, and stairs until DC from hospital.  Follow Up Recommendations  Home health PT;Supervision/Assistance - 24 hour     Equipment Recommendations  None recommended by PT    Recommendations for Other Services OT consult     Precautions / Restrictions Precautions Precautions: Knee Required Braces or Orthoses: Knee Immobilizer - Right Knee Immobilizer - Right: On when out of bed or walking (PT tx without immobilizer 2/13 PM, no adverse effects.) Restrictions Weight Bearing Restrictions: Yes RLE Weight Bearing: Weight bearing as tolerated Other Position/Activity Restrictions: WBAT    Mobility  Bed Mobility                  Transfers Overall transfer level: Needs assistance Equipment used: Rolling walker (2 wheeled) Transfers: Sit to/from Stand Sit to Stand: Min guard;Supervision         General transfer comment: training with spouse for transfer and gait assist, using gait belt, RW, and KI.  Ambulation/Gait Ambulation/Gait assistance: Supervision Ambulation Distance (Feet): 510 Feet Assistive device: Rolling walker (2 wheeled);Straight cane Gait Pattern/deviations: Step-through  pattern;Antalgic   Gait velocity interpretation: Below normal speed for age/gender General Gait Details: Trial cane, with Min Guard assist x 30 feet.   Stairs            Wheelchair Mobility    Modified Rankin (Stroke Patients Only)       Balance     Sitting balance-Leahy Scale: Good Sitting balance - Comments: denied any dizziness     Standing balance-Leahy Scale: Fair                      Cognition Arousal/Alertness: Awake/alert Behavior During Therapy: WFL for tasks assessed/performed Overall Cognitive Status: Within Functional Limits for tasks assessed                      Exercises Total Joint Exercises Short Arc QuadBarbaraann [name]: AAROM;Right;15 reps;Seated Straight Leg Raises: AAROM;Right;Supine;15 reps Long Arc Quad: AROM;AAROM;15 reps;Seated;Right Knee Flexion: AROM;AAROM;15 reps;Seated;Right Goniometric ROM: 5-85    General Comments        Pertinent Vitals/Pain Pain Assessment: 0-10 Pain Score: 2  Pain Location: Rt knee Pain Descriptors / Indicators: Aching Pain Intervention(s): Monitored during session    Home Living                      Prior Function            PT Goals (current goals can now be found in the care plan section) Acute Rehab PT Goals Patient Stated Goal: to go home by sunday PT Goal Formulation: With patient Time For Goal Achievement: 09/12/14 Potential to Achieve Goals: Good Progress  towards PT goals: Progressing toward goals    Frequency  7X/week    PT Plan Current plan remains appropriate    Co-evaluation             End of Session Equipment Utilized During Treatment: Gait belt (Patient asked to continue using KI, until MD clears use.) Activity Tolerance: Patient tolerated treatment well Patient left: in chair;with call bell/phone within reach;with family/visitor present     Time: 1425-1500 PT Time Calculation (min) (ACUTE ONLY): 35 min  Charges:  $Gait Training: 8-22  mins $Therapeutic Exercise: 8-22 mins                    G Codes:      Connie [name] 09/10/2014, 3:08 PM

## 2014-09-06 NOTE — Op Note (Signed)
NAMELUCREZIA, DEHNE                ACCOUNT NO.:  [id number]  MEDICAL RECORD NO.:  [id number]  LOCATION:  5N16C                        FACILITY:  [facility]  PHYSICIAN:  Harvie Junior, M.D.   DATE OF BIRTH:  July 21, 1941  DATE OF PROCEDURE:  09/05/2014 DATE OF DISCHARGE:                              OPERATIVE REPORT   PREOPERATIVE DIAGNOSIS:  End-stage degenerative joint disease, right knee.  POSTOPERATIVE DIAGNOSIS:  End-stage degenerative joint disease, right knee.  PRINCIPAL PROCEDURE:  Right total knee replacement with a Sigma system, size 3 femur, size 3 tibia, 10 mm bridging bearing, and a 35 mm all- polyethylene patella.  SURGEON:  Harvie Junior, M.D.  ASSISTANT:  Marshia Ly, PA-C  ANESTHESIA:  General.  BRIEF HISTORY:  Ms. Lesueur is a 74 year old female with a history of significant complaints of right knee pain.  She had a significant valgus malalignment preoperatively and had bone-on-bone change, had significant light activity pain and night pain.  After failure of all conservative care, she was taken to the operating room for right total knee replacement.  DESCRIPTION OF PROCEDURE:  The patient was taken to the operating room. After adequate anesthesia was obtained by general surgery, the patient was placed supine on the operating table.  The right leg was prepped and draped in usual sterile fashion.  Following this, the left leg was exsanguinated.  Blood pressure and tourniquet was inflated to 350 mmHg. Following this, a midline incision was made in the subcutaneous tissue down the level of the extensor mechanism and medial parapatellar arthrotomy was undertaken.  Anterior and posterior cruciates were removed medial and lateral meniscus, retropatellar fat pad, synovium in the anterior aspect of the femur, and the medial and lateral meniscus. Once this was done, attention was turned to the femur where an intramedullary pilot hole was drilled.  The femur was then  cut with a 5- degree valgus inclination with 10 degrees of distal bone being taken. Once this was done, attention was turned towards sizing the femur and sized to a 3.  Anterior and posterior cuts were made, chamfer and box. Attention was turned towards the tibia, sized to a 3.  The tibia was drilled and keeled.  At this point, the trial components were put in place, size 3 tibia, size 3 femur, a 10 mm bridging bearing trial was placed.  It was a little tight on the lateral side here at least the IT band off the lateral capsule and this gave [place] perfect gap balance and alignment.  Once this was completed, attention was turned towards the patella.  This was cut down to a level of 13 mm and 35 paddle was chosen.  Lugs were drilled.  Lugs drilled for the femur and at this point, the trials were left in place.  The knee put through a range of motion, excellent stability, excellent gap balance.  Once this was completed, the trial components were removed.  The knee was then copiously and thoroughly lavaged and suctioned dry.  The final components were then cemented into place, size 3 tibia, size 3 femur, 10 mm bridging bearing trial was placed and a 35 all poly patella was placed and  held with a clamp.  All cement was allowed to harden.  At this point, the knee was copiously and thoroughly lavaged, suctioned dry, and the tourniquet was let down once the cement was hardened and all excess bone cement has been removed.  All bleeding was controlled here with electrocautery.  A 60 mL of Exparel which was a mixture of 40 of Exparel and 20 of Marcaine was instilled throughout the knee for postoperative anesthesia and at this point, the final poly was placed. The final stability checks were made.  Excellent range of motion and stability were achieved.  The knee was irrigated once again and suctioned dry.  Medial parapatellar arthrotomy was closed with 1 Vicryl running, the skin with 0 and 2-0 Vicryl  and 3-0 Monocryl subcuticular. Benzoin and Steri-Strips were applied.  Sterile compressive dressing was applied.  The patient was taken to the recovery room, where she was noted to be in satisfactory condition.  Estimated blood loss for the procedure was minimal.     Harvie JuniorJohn L. Aika Brzoska, M.D.     Ranae PlumberJLG/MEDQ  D:  09/05/2014  T:  09/06/2014  Job:  [id number]

## 2014-09-06 NOTE — Progress Notes (Signed)
Physical Therapy Treatment Patient Details Name: Connie [name] MRN: [id number] DOB: [birth year] Today's Date: 09/06/2014    History of Present Illness pt is a 74 y.o. female s/p Rt TKA.     PT Comments    Patient and spouse present, agreeable to therapy.  Review and re-instruction of self exercises, education about exercise progression as mobility improves, and use of KI when walking/transferring.  Min assist bed mobility from flat bed (with rail), Supervision ambulation with RW, stair training 1 rail plus spouse and MIN assist.  Patient remains appropriate for skilled PT services.  Follow Up Recommendations  Home health PT;Supervision/Assistance - 24 hour     Equipment Recommendations  None recommended by PT    Recommendations for Other Services       Precautions / Restrictions Precautions Precautions: Knee Required Braces or Orthoses: Knee Immobilizer - Right Knee Immobilizer - Right: On when out of bed or walking Restrictions Weight Bearing Restrictions: Yes RLE Weight Bearing: Weight bearing as tolerated Other Position/Activity Restrictions: WBAT    Mobility  Bed Mobility Overal bed mobility: Needs Assistance Bed Mobility: Supine to Sit;Sit to Supine     Supine to sit: Min guard Sit to supine: Min assist   General bed mobility comments: Bed flat, mild cues, Min assist for LE management  Transfers Overall transfer level: Needs assistance Equipment used: Rolling walker (2 wheeled) Transfers: Sit to/from Stand Sit to Stand: Min guard         General transfer comment: cues for hand placement and safety with RW  Ambulation/Gait Ambulation/Gait assistance: Supervision Ambulation Distance (Feet): 140 Feet Assistive device: Rolling walker (2 wheeled) Gait Pattern/deviations: Step-to pattern;Antalgic Gait velocity: decr Gait velocity interpretation: Below normal speed for age/gender General Gait Details: Leading with R foot in KI.   Stairs Stairs:  Yes Stairs assistance: Min assist Stair Management: One rail Right (Plus spouse on non-rail side) Number of Stairs: 4 General stair comments: Cues for spouse assist, and various methods.  Wheelchair Mobility    Modified Rankin (Stroke Patients Only)       Balance     Sitting balance-Leahy Scale: Good       Standing balance-Leahy Scale: Fair Standing balance comment: RW during mobility, can stand mostly unsupported.                    Cognition Arousal/Alertness: Awake/alert Behavior During Therapy: WFL for tasks assessed/performed Overall Cognitive Status: Within Functional Limits for tasks assessed                      Exercises Total Joint Exercises Ankle Circles/Pumps: AROM;Both;15 reps;Supine Quad Sets: AROM;Right;10 reps;Seated Heel Slides: AAROM;Right;10 reps;Supine Hip ABduction/ADduction: AAROM;Right;10 reps;Supine Straight Leg Raises: AAROM;Right;10 reps;Supine    General Comments        Pertinent Vitals/Pain Pain Assessment: 0-10 Pain Score: 3  Pain Location: Rt knee Pain Descriptors / Indicators: Aching Pain Intervention(s): Monitored during session    Home Living                      Prior Function            PT Goals (current goals can now be found in the care plan section) Acute Rehab PT Goals Patient Stated Goal: to go home by sunday PT Goal Formulation: With patient Time For Goal Achievement: 09/12/14 Potential to Achieve Goals: Good Progress towards PT goals: Progressing toward goals    Frequency  7X/week    PT Plan  Current plan remains appropriate    Co-evaluation             End of Session Equipment Utilized During Treatment: Gait belt;Right knee immobilizer Activity Tolerance: Patient tolerated treatment well Patient left: in bed;with family/visitor present;with call bell/phone within reach     Time: 1020-1056 PT Time Calculation (min) (ACUTE ONLY): 36 min  Charges:  $Gait Training: 8-22  mins $Therapeutic Exercise: 8-22 mins                    G Codes:      Hussain Maimone L 09/06/2014, 11:08 AM

## 2014-09-07 LAB — CBC
HEMATOCRIT: 30.2 % — AB (ref 36.0–46.0)
Hemoglobin: 9.9 g/dL — ABNORMAL LOW (ref 12.0–15.0)
MCH: 28.9 pg (ref 26.0–34.0)
MCHC: 32.8 g/dL (ref 30.0–36.0)
MCV: 88 fL (ref 78.0–100.0)
PLATELETS: 244 10*3/uL (ref 150–400)
RBC: 3.43 MIL/uL — ABNORMAL LOW (ref 3.87–5.11)
RDW: 12.9 % (ref 11.5–15.5)
WBC: 12.1 10*3/uL — ABNORMAL HIGH (ref 4.0–10.5)

## 2014-09-07 NOTE — Discharge Summary (Signed)
Patient ID: Connie [name] MRN: [id number] DOB/AGE: Mar 21, 1941 75 y.o.  Admit date: 09/05/2014 Discharge date: 09/07/2014  Admission Diagnoses:  Principal Problem:   Primary osteoarthritis of right knee   Discharge Diagnoses:  Same  Past Medical History  Diagnosis Date  . Hypertension   . Sinus drainage   . Hypothyroidism   . Depression   . GERD (gastroesophageal reflux disease)   . History of hiatal hernia   . Arthritis     Surgeries: Procedure(s): Right TOTAL KNEE ARTHROPLASTY on 09/05/2014   Consultants:    Discharged Condition: Improved  Hospital Course: Connie [name] is an 74 y.o. female who was admitted 09/05/2014 for operative treatment ofPrimary osteoarthritis of right knee. Patient has severe unremitting pain that affects sleep, daily activities, and work/hobbies. After pre-op clearance the patient was taken to the operating room on 09/05/2014 and underwent  Procedure(s): TOTAL KNEE ARTHROPLASTY.    Patient was given perioperative antibiotics: Anti-infectives    Start     Dose/Rate Route Frequency Ordered Stop   09/05/14 1230  ceFAZolin (ANCEF) IVPB 2 g/50 mL premix     2 g 100 mL/hr over 30 Minutes Intravenous Every 6 hours 09/05/14 1044 09/05/14 1900   09/05/14 0600  ceFAZolin (ANCEF) IVPB 2 g/50 mL premix     2 g 100 mL/hr over 30 Minutes Intravenous On call to O.R. 09/04/14 1420 09/05/14 0752   09/05/14 0553  ceFAZolin (ANCEF) 2-3 GM-% IVPB SOLR    Comments:  Connie [name]   : cabinet override      09/05/14 0553 09/05/14 1759       Patient was given sequential compression devices, early ambulation, and chemoprophylaxis to prevent DVT.  Patient benefited maximally from hospital stay and there were no complications.    Recent vital signs: Patient Vitals for the past 24 hrs:  BP Temp Temp src Pulse Resp SpO2  09/07/14 0620 (!) 106/49 mmHg 98.2 F (36.8 C) Oral 89 18 94 %  09/06/14 2026 (!) 138/58 mmHg 99.6 F (37.6 C) Oral 99 18 90 %  09/06/14 1530  118/62 mmHg 98.6 F (37 C) Oral 78 19 96 %     Recent laboratory studies:  Recent Labs  09/06/14 0530 09/07/14 0554  WBC 13.8* 12.1*  HGB 10.4* 9.9*  HCT 32.2* 30.2*  PLT 300 244  NA 132*  --   K 4.1  --   CL 97  --   CO2 28  --   BUN 13  --   CREATININE 1.03  --   GLUCOSE 115*  --   CALCIUM 8.4  --      Discharge Medications:     Medication List    STOP taking these medications        Naproxen Sodium 220 MG Caps      TAKE these medications        aspirin EC 325 MG tablet  Take 1 tablet (325 mg total) by mouth 2 (two) times daily after a meal.     esomeprazole 20 MG capsule  Commonly known as:  NEXIUM  Take 20 mg by mouth daily at 12 noon.     hydrochlorothiazide 25 MG tablet  Commonly known as:  HYDRODIURIL  Take 25 mg by mouth daily.     irbesartan 150 MG tablet  Commonly known as:  AVAPRO  Take 150 mg by mouth daily.     levothyroxine 75 MCG tablet  Commonly known as:  SYNTHROID, LEVOTHROID  Take 75 mcg by  mouth daily before breakfast.     lovastatin 40 MG tablet  Commonly known as:  MEVACOR  Take 40 mg by mouth daily.     methocarbamol 500 MG tablet  Commonly known as:  ROBAXIN  Take 1 tablet (500 mg total) by mouth every 8 (eight) hours as needed for muscle spasms.     oxyCODONE-acetaminophen 5-325 MG per tablet  Commonly known as:  PERCOCET/ROXICET  Take 1-2 tablets by mouth every 6 (six) hours as needed for severe pain.     sertraline 100 MG tablet  Commonly known as:  ZOLOFT  Take 100 mg by mouth daily.        Diagnostic Studies: Dg Chest 2 View  08/25/2014   CLINICAL DATA:  Preop for total right knee replacement  EXAM: CHEST  2 VIEW  COMPARISON:  None.  FINDINGS: Cardiomediastinal silhouette is unremarkable. No acute infiltrate or pleural effusion. No pulmonary edema. Mild degenerative changes mid and lower thoracic spine.  IMPRESSION: No active cardiopulmonary disease.   Electronically Signed   By: Natasha MeadLiviu  Pop M.D.   On: 08/25/2014  10:09    Disposition:       Discharge Instructions    CPM    Complete by:  As directed   Continuous passive motion machine (CPM):      Use the CPM from 0 to 60 for 8 hours per day.      You may increase by 5-10 per day.  You may break it up into 2 or 3 sessions per day.      Use CPM for 1-2 weeks or until you are told to stop.     Call MD / Call 911    Complete by:  As directed   If you experience chest pain or shortness of breath, CALL 911 and be transported to the hospital emergency room.  If you develope a fever above 101 F, pus (white drainage) or increased drainage or redness at the wound, or calf pain, call your surgeon's office.     Constipation Prevention    Complete by:  As directed   Drink plenty of fluids.  Prune juice may be helpful.  You may use a stool softener, such as Colace (over the counter) 100 mg twice a day.  Use MiraLax (over the counter) for constipation as needed.     Diet general    Complete by:  As directed      Do not put a pillow under the knee. Place it under the heel.    Complete by:  As directed      Increase activity slowly as tolerated    Complete by:  As directed      Weight bearing as tolerated    Complete by:  As directed   Laterality:  right  Extremity:  Lower           Follow-up Information    Follow up with GRAVES,JOHN L, MD. Schedule an appointment as soon as possible for a visit in 2 weeks.   Specialty:  Orthopedic Surgery   Contact information:   [address] [place] [address] [phone]       [facility]   Why:  They will contact you to schedule home therapy visits.    Contact information:   [address] [place] [address] [phone]        Signed: Matthew FolksBETHUNE,Manal Kreutzer G 09/07/2014, 11:39 AM

## 2014-09-07 NOTE — Progress Notes (Signed)
Subjective: 2 Days Post-Op Procedure(s) (LRB): TOTAL KNEE ARTHROPLASTY (Right) Patient reports pain as 2 on 0-10 scale.  Making good progress with physical therapy. Taking by mouth and voiding okay. Ready to go home.  Objective: Vital signs in last 24 hours: Temp:  [98.2 F (36.8 C)-99.6 F (37.6 C)] 98.2 F (36.8 C) (02/14 0620) Pulse Rate:  [78-99] 89 (02/14 0620) Resp:  [18-19] 18 (02/14 0620) BP: (106-138)/(49-62) 106/49 mmHg (02/14 0620) SpO2:  [90 %-96 %] 94 % (02/14 0620)  Intake/Output from previous day: 02/13 0701 - 02/14 0700 In: 583.3 [P.O.:480; I.V.:103.3] Out: -  Intake/Output this shift: Total I/O In: 240 [P.O.:240] Out: -    Recent Labs  09/06/14 0530 09/07/14 0554  HGB 10.4* 9.9*    Recent Labs  09/06/14 0530 09/07/14 0554  WBC 13.8* 12.1*  RBC 3.73* 3.43*  HCT 32.2* 30.2*  PLT 300 244    Recent Labs  09/06/14 0530  NA 132*  K 4.1  CL 97  CO2 28  BUN 13  CREATININE 1.03  GLUCOSE 115*  CALCIUM 8.4   No results for input(s): LABPT, INR in the last 72 hours. Right knee exam: Neurovascular intact Sensation intact distally Intact pulses distally Dorsiflexion/Plantar flexion intact Incision: no drainage Compartment soft  Assessment/Plan: 2 Days Post-Op Procedure(s) (LRB): TOTAL KNEE ARTHROPLASTY (Right) Plan: Dressing changed. Up with therapy Discharge home with home health Follow-up with Dr. Luiz BlareGraves in 2 weeks.  Woodruff Skirvin G 09/07/2014, 11:36 AM

## 2014-09-07 NOTE — Clinical Social Work Note (Signed)
CSW received consult for SNF. CSW reviewed patient's chart and [facility] No further needs at this time. CSW signing off.  Mieka Leaton Patrick-Jefferson, LCSWA Weekend Clinical Social Worker [phone]

## 2014-09-07 NOTE — Progress Notes (Signed)
Physical Therapy Treatment Patient Details Name: Connie [name] MRN: [id number] DOB: 24-Jul-1941 Today's Date: 09/07/2014    History of Present Illness pt is a 74 y.o. female s/p Rt TKA.     PT Comments    Pt at supervision level for mobility. AROM in sitting 0 to 80 degrees. Pt ready from mobility standpoint to D/C home today.   Follow Up Recommendations  Home health PT;Supervision/Assistance - 24 hour     Equipment Recommendations  None recommended by PT    Recommendations for Other Services       Precautions / Restrictions Precautions Precautions: Knee Precaution Comments: reviewed handout and no pillow under knee precaution  Required Braces or Orthoses: Knee Immobilizer - Right Knee Immobilizer - Right: Other (comment) (pt wearing brace; is able to ambulate without it) Restrictions Weight Bearing Restrictions: Yes RLE Weight Bearing: Weight bearing as tolerated    Mobility  Bed Mobility               General bed mobility comments: pt up in room upon arrival  Transfers Overall transfer level: Needs assistance Equipment used: Rolling walker (2 wheeled) Transfers: Sit to/from Stand Sit to Stand: Supervision         General transfer comment: min cues to reach back for chair for safety  Ambulation/Gait Ambulation/Gait assistance: Supervision Ambulation Distance (Feet): 520 Feet Assistive device: Rolling walker (2 wheeled);Straight cane Gait Pattern/deviations: Step-through pattern;Decreased stride length Gait velocity: decr Gait velocity interpretation: Below normal speed for age/gender General Gait Details: pt ambulating with RW to increase stability and stride length; pt with KI on for "comfort"; no LOB noted; min cues for RW safety     Stairs         General stair comments: pt did not want to practice; did review verbally and was able to teachback proper technique   Wheelchair Mobility    Modified Rankin (Stroke Patients Only)        Balance Overall balance assessment: No apparent balance deficits (not formally assessed)           Standing balance-Leahy Scale: Fair Standing balance comment: stood without RW for short period of time                    Cognition Arousal/Alertness: Awake/alert Behavior During Therapy: WFL for tasks assessed/performed Overall Cognitive Status: Within Functional Limits for tasks assessed                      Exercises Total Joint Exercises Ankle Circles/Pumps: AROM;Both;15 reps;Supine Quad Sets: AROM;Right;10 reps;Seated Short Arc Quad: AROM;Right;10 reps;Seated Heel Slides: AAROM;Right;10 reps;Supine Hip ABduction/ADduction: AAROM;Right;10 reps;Supine Long Arc Quad: AROM;Seated;Right;10 reps Goniometric ROM: 0 to 80 degrees    General Comments        Pertinent Vitals/Pain Pain Assessment: 0-10 Pain Score: 2  Pain Location: Rt knee  Pain Descriptors / Indicators: Sore Pain Intervention(s): Monitored during session;Premedicated before session;Repositioned    Home Living                      Prior Function            PT Goals (current goals can now be found in the care plan section) Acute Rehab PT Goals Patient Stated Goal: home today PT Goal Formulation: With patient Time For Goal Achievement: 09/12/14 Potential to Achieve Goals: Good Progress towards PT goals: Progressing toward goals    Frequency  7X/week    PT Plan Current plan  remains appropriate    Co-evaluation             End of Session   Activity Tolerance: Patient tolerated treatment well Patient left: in chair;with call bell/phone within reach     Time: 0738-0802 PT Time Calculation (min) (ACUTE ONLY): 24 min  Charges:  $Gait Training: 8-22 mins $Therapeutic Exercise: 8-22 mins                    G CodesDonell [name]:      Connie [name] N, [place]  [phone] 09/07/2014, 8:43 AM

## 2014-09-09 ENCOUNTER — Encounter (HOSPITAL_COMMUNITY): Payer: Self-pay | Admitting: Orthopedic Surgery

## 2015-03-11 ENCOUNTER — Other Ambulatory Visit (HOSPITAL_COMMUNITY): Payer: Self-pay | Admitting: Family Medicine

## 2015-03-11 DIAGNOSIS — Z1231 Encounter for screening mammogram for malignant neoplasm of breast: Secondary | ICD-10-CM

## 2015-03-18 ENCOUNTER — Ambulatory Visit (HOSPITAL_COMMUNITY)
Admission: RE | Admit: 2015-03-18 | Discharge: 2015-03-18 | Disposition: A | Discharge status: Home / Self Care | Payer: Medicare Other | Source: Ambulatory Visit | Attending: Family Medicine | Admitting: Family Medicine

## 2015-03-18 DIAGNOSIS — Z1231 Encounter for screening mammogram for malignant neoplasm of breast: Secondary | ICD-10-CM | POA: Insufficient documentation

## 2015-03-18 DIAGNOSIS — R928 Other abnormal and inconclusive findings on diagnostic imaging of breast: Secondary | ICD-10-CM | POA: Insufficient documentation

## 2015-03-19 ENCOUNTER — Other Ambulatory Visit: Payer: Self-pay | Admitting: Family Medicine

## 2015-03-19 DIAGNOSIS — R928 Other abnormal and inconclusive findings on diagnostic imaging of breast: Secondary | ICD-10-CM

## 2015-03-24 ENCOUNTER — Ambulatory Visit
Admission: RE | Admit: 2015-03-24 | Discharge: 2015-03-24 | Disposition: A | Discharge status: Home / Self Care | Payer: Medicare Other | Source: Ambulatory Visit | Attending: Family Medicine | Admitting: Family Medicine

## 2015-03-24 DIAGNOSIS — R928 Other abnormal and inconclusive findings on diagnostic imaging of breast: Secondary | ICD-10-CM

## 2015-07-26 HISTORY — PX: SCAR REVISION: SHX5285

## 2016-04-04 ENCOUNTER — Other Ambulatory Visit: Payer: Self-pay | Admitting: Family Medicine

## 2016-04-04 DIAGNOSIS — Z1231 Encounter for screening mammogram for malignant neoplasm of breast: Secondary | ICD-10-CM

## 2016-05-12 ENCOUNTER — Ambulatory Visit
Admission: RE | Admit: 2016-05-12 | Discharge: 2016-05-12 | Disposition: A | Discharge status: Home / Self Care | Payer: Medicare Other | Source: Ambulatory Visit | Attending: Family Medicine | Admitting: Family Medicine

## 2016-05-12 DIAGNOSIS — Z1231 Encounter for screening mammogram for malignant neoplasm of breast: Secondary | ICD-10-CM

## 2016-05-16 ENCOUNTER — Other Ambulatory Visit: Payer: Self-pay | Admitting: Family Medicine

## 2016-05-16 DIAGNOSIS — R928 Other abnormal and inconclusive findings on diagnostic imaging of breast: Secondary | ICD-10-CM

## 2016-06-08 ENCOUNTER — Ambulatory Visit
Admission: RE | Admit: 2016-06-08 | Discharge: 2016-06-08 | Disposition: A | Discharge status: Home / Self Care | Payer: Medicare Other | Source: Ambulatory Visit | Attending: Family Medicine | Admitting: Family Medicine

## 2016-06-08 DIAGNOSIS — R928 Other abnormal and inconclusive findings on diagnostic imaging of breast: Secondary | ICD-10-CM

## 2016-10-26 ENCOUNTER — Other Ambulatory Visit: Payer: Self-pay | Admitting: Family Medicine

## 2016-10-26 DIAGNOSIS — R1011 Right upper quadrant pain: Secondary | ICD-10-CM

## 2016-10-27 ENCOUNTER — Ambulatory Visit
Admission: RE | Admit: 2016-10-27 | Discharge: 2016-10-27 | Disposition: A | Discharge status: Home / Self Care | Payer: Medicare Other | Source: Ambulatory Visit | Attending: Family Medicine | Admitting: Family Medicine

## 2016-10-27 DIAGNOSIS — R1011 Right upper quadrant pain: Secondary | ICD-10-CM

## 2016-10-27 DIAGNOSIS — K7689 Other specified diseases of liver: Secondary | ICD-10-CM

## 2016-10-27 HISTORY — DX: Other specified diseases of liver: K76.89

## 2017-05-11 ENCOUNTER — Other Ambulatory Visit: Payer: Self-pay | Admitting: Family Medicine

## 2017-05-11 DIAGNOSIS — Z1231 Encounter for screening mammogram for malignant neoplasm of breast: Secondary | ICD-10-CM

## 2017-06-06 ENCOUNTER — Ambulatory Visit
Admission: RE | Admit: 2017-06-06 | Discharge: 2017-06-06 | Disposition: A | Discharge status: Home / Self Care | Payer: Medicare Other | Source: Ambulatory Visit | Attending: Family Medicine | Admitting: Family Medicine

## 2017-06-06 DIAGNOSIS — Z1231 Encounter for screening mammogram for malignant neoplasm of breast: Secondary | ICD-10-CM

## 2017-10-31 ENCOUNTER — Other Ambulatory Visit: Payer: Self-pay | Admitting: Orthopedic Surgery

## 2017-11-01 ENCOUNTER — Other Ambulatory Visit: Payer: Self-pay | Admitting: Orthopedic Surgery

## 2017-11-03 ENCOUNTER — Other Ambulatory Visit: Payer: Self-pay | Admitting: Orthopedic Surgery

## 2017-11-13 ENCOUNTER — Encounter (HOSPITAL_COMMUNITY): Payer: Self-pay

## 2017-11-13 NOTE — Patient Instructions (Signed)
Your procedure is scheduled on: Friday, November 17, 2017   Surgery Time:  10:00AM-12:40PM   [facility]    Report to admitting at 7:30AM   Call this number if you have problems the morning of surgery [phone]   Do not eat food or drink liquids :After Midnight.   Do NOT smoke after Midnight   Take these medicines the morning of surgery with A SIP OF WATER: Levothyroxine, Lovastatin, Sertraline                               You may not have any metal on your body including hair pins, jewelry, and body piercings             Do not wear make-up, lotions, powders, perfumes/cologne, or deodorant             Do not wear nail polish.  Do not shave  48 hours prior to surgery.                Do not bring valuables to the hospital. [facility] IS NOT             RESPONSIBLE   FOR VALUABLES.   Contacts, dentures or bridgework may not be worn into surgery.   Leave suitcase in the car. After surgery it may be brought to your room.   Special Instructions: Bring a copy of your healthcare power of attorney and living will documents         the day of surgery if you haven't scanned them in before.              Please read over the following fact sheets you were given:  [facility] - Preparing [facility] Before surgery, you can play an important role.  Because skin is not sterile, your skin needs to be as free of germs as possible.  You can reduce the number of germs on your skin by washing with CHG (chlorahexidine gluconate) soap before surgery.  CHG is an antiseptic cleaner which kills germs and bonds with the skin to continue killing germs even after washing. Please DO NOT use if you have an allergy to CHG or antibacterial soaps.  If your skin becomes reddened/irritated stop using the CHG and inform your nurse when you arrive at Short Stay. Do not shave (including legs and underarms) for at least 48 hours prior to the first CHG shower.  You may shave your  face/neck.  Please follow these instructions carefully:  1.  Shower with CHG Soap the night before surgery and the  morning of surgery.  2.  If you choose to wash your hair, wash your hair first as usual with your normal  shampoo.  3.  After you shampoo, rinse your hair and body thoroughly to remove the shampoo.                             4.  Use CHG as you would any other liquid soap.  You can apply chg directly to the skin and wash.  Gently with a scrungie or clean washcloth.  5.  Apply the CHG Soap to your body ONLY FROM THE NECK DOWN.   Do   not use on face/ open  Wound or open sores. Avoid contact with eyes, ears mouth and   genitals (private parts).                       Wash face,  Genitals (private parts) with your normal soap.             6.  Wash thoroughly, paying special attention to the area where your    surgery  will be performed.  7.  Thoroughly rinse your body with warm water from the neck down.  8.  DO NOT shower/wash with your normal soap after using and rinsing off the CHG Soap.                9.  Pat yourself dry with a clean towel.            10.  Wear clean pajamas.            11.  Place clean sheets on your bed the night of your first shower and do not  sleep with pets. Day of Surgery : Do not apply any lotions/deodorants the morning of surgery.  Please wear clean clothes to the hospital/surgery center.  FAILURE TO FOLLOW THESE INSTRUCTIONS MAY RESULT IN THE CANCELLATION OF YOUR SURGERY  PATIENT SIGNATURE_________________________________  NURSE SIGNATURE__________________________________  ________________________________________________________________________   Connie [name]  An incentive spirometer is a tool that can help keep your lungs clear and active. This tool measures how well you are filling your lungs with each breath. Taking long deep breaths may help reverse or decrease the chance of developing breathing (pulmonary)  problems (especially infection) following:  A long period of time when you are unable to move or be active. BEFORE THE PROCEDURE   If the spirometer includes an indicator to show your best effort, your nurse or respiratory therapist will set it to a desired goal.  If possible, sit up straight or lean slightly forward. Try not to slouch.  Hold the incentive spirometer in an upright position. INSTRUCTIONS FOR USE  1. Sit on the edge of your bed if possible, or sit up as far as you can in bed or on a chair. 2. Hold the incentive spirometer in an upright position. 3. Breathe out normally. 4. Place the mouthpiece in your mouth and seal your lips tightly around it. 5. Breathe in slowly and as deeply as possible, raising the piston or the ball toward the top of the column. 6. Hold your breath for 3-5 seconds or for as long as possible. Allow the piston or ball to fall to the bottom of the column. 7. Remove the mouthpiece from your mouth and breathe out normally. 8. Rest for a few seconds and repeat Steps 1 through 7 at least 10 times every 1-2 hours when you are awake. Take your time and take a few normal breaths between deep breaths. 9. The spirometer may include an indicator to show your best effort. Use the indicator as a goal to work toward during each repetition. 10. After each set of 10 deep breaths, practice coughing to be sure your lungs are clear. If you have an incision (the cut made at the time of surgery), support your incision when coughing by placing a pillow or rolled up towels firmly against it. Once you are able to get out of bed, walk around indoors and cough well. You may stop using the incentive spirometer when instructed by your caregiver.  RISKS AND COMPLICATIONS  Take your time  so you do not get dizzy or light-headed.  If you are in pain, you may need to take or ask for pain medication before doing incentive spirometry. It is harder to take a deep breath if you are having  pain. AFTER USE  Rest and breathe slowly and easily.  It can be helpful to keep track of a log of your progress. Your caregiver can provide you with a simple table to help with this. If you are using the spirometer at home, follow these instructions: [place] IF:   You are having difficultly using the spirometer.  You have trouble using the spirometer as often as instructed.  Your pain medication is not giving enough relief while using the spirometer.  You develop fever of 100.5 F (38.1 C) or higher. SEEK IMMEDIATE MEDICAL CARE IF:   You cough up bloody sputum that had not been present before.  You develop fever of 102 F (38.9 C) or greater.  You develop worsening pain at or near the incision site. MAKE SURE YOU:   Understand these instructions.  Will watch your condition.  Will get help right away if you are not doing well or get worse. Document Released: 11/21/2006 Document Revised: 10/03/2011 Document Reviewed: 01/22/2007 Exit[employer] Patient Information 2014 [employer], [place].   ________________________________________________________________________  WHAT IS A BLOOD TRANSFUSION? Blood Transfusion Information  A transfusion is the replacement of blood or some of its parts. Blood is made up of multiple cells which provide different functions.  Red blood cells carry oxygen and are used for blood loss replacement.  White blood cells fight against infection.  Platelets control bleeding.  Plasma helps clot blood.  Other blood products are available for specialized needs, such as hemophilia or other clotting disorders. BEFORE THE TRANSFUSION  Who gives blood for transfusions?   Healthy volunteers who are fully evaluated to make sure their blood is safe. This is blood bank blood. Transfusion therapy is the safest it has ever been in the practice of medicine. Before blood is taken from a donor, a complete history is taken to make sure that person has no history  of diseases nor engages in risky social behavior (examples are intravenous drug use or sexual activity with multiple partners). The donor's travel history is screened to minimize risk of transmitting infections, such as malaria. The donated blood is tested for signs of infectious diseases, such as HIV and hepatitis. The blood is then tested to be sure it is compatible with you in order to minimize the chance of a transfusion reaction. If you or a relative donates blood, this is often done in anticipation of surgery and is not appropriate for emergency situations. It takes many days to process the donated blood. RISKS AND COMPLICATIONS Although transfusion therapy is very safe and saves many lives, the main dangers of transfusion include:   Getting an infectious disease.  Developing a transfusion reaction. This is an allergic reaction to something in the blood you were given. Every precaution is taken to prevent this. The decision to have a blood transfusion has been considered carefully by your caregiver before blood is given. Blood is not given unless the benefits outweigh the risks. AFTER THE TRANSFUSION  Right after receiving a blood transfusion, you will usually feel much better and more energetic. This is especially true if your red blood cells have gotten low (anemic). The transfusion raises the level of the red blood cells which carry oxygen, and this usually causes an energy increase.  The  nurse administering the transfusion will monitor you carefully for complications. HOME CARE INSTRUCTIONS  No special instructions are needed after a transfusion. You may find your energy is better. Speak with your caregiver about any limitations on activity for underlying diseases you may have. SEEK MEDICAL CARE IF:   Your condition is not improving after your transfusion.  You develop redness or irritation at the intravenous (IV) site. SEEK IMMEDIATE MEDICAL CARE IF:  Any of the following symptoms  occur over the next 12 hours:  Shaking chills.  You have a temperature by mouth above 102 F (38.9 C), not controlled by medicine.  Chest, back, or muscle pain.  People around you feel you are not acting correctly or are confused.  Shortness of breath or difficulty breathing.  Dizziness and fainting.  You get a rash or develop hives.  You have a decrease in urine output.  Your urine turns a dark color or changes to pink, red, or brown. Any of the following symptoms occur over the next 10 days:  You have a temperature by mouth above 102 F (38.9 C), not controlled by medicine.  Shortness of breath.  Weakness after normal activity.  The white part of the eye turns yellow (jaundice).  You have a decrease in the amount of urine or are urinating less often.  Your urine turns a dark color or changes to pink, red, or brown. Document Released: 07/08/2000 Document Revised: 10/03/2011 Document Reviewed: 02/25/2008 [facility] Patient Information 2014 [place], [place].  _______________________________________________________________________

## 2017-11-14 ENCOUNTER — Encounter (HOSPITAL_COMMUNITY): Payer: Self-pay

## 2017-11-14 ENCOUNTER — Other Ambulatory Visit: Payer: Self-pay

## 2017-11-14 ENCOUNTER — Ambulatory Visit (HOSPITAL_COMMUNITY)
Admission: RE | Admit: 2017-11-14 | Discharge: 2017-11-14 | Disposition: A | Discharge status: Home / Self Care | Payer: Medicare Other | Source: Ambulatory Visit | Attending: Orthopedic Surgery | Admitting: Orthopedic Surgery

## 2017-11-14 ENCOUNTER — Encounter (HOSPITAL_COMMUNITY)
Admission: RE | Admit: 2017-11-14 | Discharge: 2017-11-14 | Disposition: A | Discharge status: Home / Self Care | Payer: Medicare Other | Source: Ambulatory Visit | Attending: Orthopedic Surgery | Admitting: Orthopedic Surgery

## 2017-11-14 DIAGNOSIS — Z0183 Encounter for blood typing: Secondary | ICD-10-CM

## 2017-11-14 DIAGNOSIS — Z01818 Encounter for other preprocedural examination: Secondary | ICD-10-CM | POA: Insufficient documentation

## 2017-11-14 DIAGNOSIS — I4510 Unspecified right bundle-branch block: Secondary | ICD-10-CM

## 2017-11-14 DIAGNOSIS — M19012 Primary osteoarthritis, left shoulder: Secondary | ICD-10-CM | POA: Insufficient documentation

## 2017-11-14 DIAGNOSIS — R9431 Abnormal electrocardiogram [ECG] [EKG]: Secondary | ICD-10-CM

## 2017-11-14 DIAGNOSIS — Z01812 Encounter for preprocedural laboratory examination: Secondary | ICD-10-CM | POA: Insufficient documentation

## 2017-11-14 HISTORY — DX: Urinary tract infection, site not specified: N39.0

## 2017-11-14 HISTORY — DX: Unspecified macular degeneration: H35.30

## 2017-11-14 HISTORY — DX: Other specified diseases of liver: K76.89

## 2017-11-14 HISTORY — DX: Arthropathic psoriasis, unspecified: L40.50

## 2017-11-14 LAB — BASIC METABOLIC PANEL
ANION GAP: 11 (ref 5–15)
BUN: 17 mg/dL (ref 6–20)
CO2: 24 mmol/L (ref 22–32)
Calcium: 9.3 mg/dL (ref 8.9–10.3)
Chloride: 98 mmol/L — ABNORMAL LOW (ref 101–111)
Creatinine, Ser: 0.84 mg/dL (ref 0.44–1.00)
GFR calc Af Amer: >60 mL/min (ref >60)
GFR calc non Af Amer: >60 mL/min (ref >60)
GLUCOSE: 100 mg/dL — AB (ref 65–99)
POTASSIUM: 5 mmol/L (ref 3.5–5.1)
Sodium: 133 mmol/L — ABNORMAL LOW (ref 135–145)

## 2017-11-14 LAB — CBC WITH DIFFERENTIAL/PLATELET
Basophils Absolute: 0.1 10*3/uL (ref 0.0–0.1)
Basophils Relative: 1 %
EOS PCT: 1 %
Eosinophils Absolute: 0.1 10*3/uL (ref 0.0–0.7)
HEMATOCRIT: 39.2 % (ref 36.0–46.0)
Hemoglobin: 12.9 g/dL (ref 12.0–15.0)
LYMPHS PCT: 13 %
Lymphs Abs: 1.3 10*3/uL (ref 0.7–4.0)
MCH: 29.1 pg (ref 26.0–34.0)
MCHC: 32.9 g/dL (ref 30.0–36.0)
MCV: 88.3 fL (ref 78.0–100.0)
MONO ABS: 0.9 10*3/uL (ref 0.1–1.0)
Monocytes Relative: 9 %
NEUTROS ABS: 7.5 10*3/uL (ref 1.7–7.7)
Neutrophils Relative %: 76 %
PLATELETS: 356 10*3/uL (ref 150–400)
RBC: 4.44 MIL/uL (ref 3.87–5.11)
RDW: 14.7 % (ref 11.5–15.5)
WBC: 10 10*3/uL (ref 4.0–10.5)

## 2017-11-14 LAB — SURGICAL PCR SCREEN
MRSA, PCR: NEGATIVE
Staphylococcus aureus: NEGATIVE

## 2017-11-14 LAB — URINALYSIS, ROUTINE W REFLEX MICROSCOPIC
Bilirubin Urine: NEGATIVE
GLUCOSE, UA: NEGATIVE mg/dL
HGB URINE DIPSTICK: NEGATIVE
Ketones, ur: NEGATIVE mg/dL
NITRITE: POSITIVE — AB
PROTEIN: NEGATIVE mg/dL
Specific Gravity, Urine: 1.009 (ref 1.005–1.030)
pH: 8 (ref 5.0–8.0)

## 2017-11-14 LAB — PROTIME-INR
INR: 1.02
Prothrombin Time: 13.3 seconds (ref 11.4–15.2)

## 2017-11-14 LAB — APTT: aPTT: 40 seconds — ABNORMAL HIGH (ref 24–36)

## 2017-11-14 LAB — ABO/RH: ABO/RH(D): O POS

## 2017-11-14 NOTE — Pre-Procedure Instructions (Signed)
BMP, PTT, and UA results 11/14/2017 faxed to Dr. Luiz BlareGraves via epic.  Dr. Bradley FerrisEllender made aware of PTT 40 results 11/14/2017, no new orders received from Dr. Bradley FerrisEllender at this time.

## 2017-11-16 MED ORDER — TRANEXAMIC ACID 1000 MG/10ML IV SOLN
1000.0000 mg | INTRAVENOUS | Status: AC
  Administered 2017-11-17: 1000 mg via INTRAVENOUS
  Filled 2017-11-16: qty 1100

## 2017-11-16 NOTE — H&P (Addendum)
PREOPERATIVE H&P  Chief Complaint: Left shoulder pain  HPI: Connie [name] is a 77 y.o. female who presents for evaluation of left shoulder pain. It has been present for many months and has been worsening. She has failed conservative measures. Pain is rated as severe.  The patient has been evaluated in our office many times.  She has night pain and light activity pain.  She has x-rays showing severe bone-on-bone change.  She is failed conservative care including activity modification, medication, prior arthroscopy, and injection therapy.  Past Medical History:  Diagnosis Date  . Arthritis   . Depression   . GERD (gastroesophageal reflux disease)   . 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 History of hiatal hernia   . Hypertension   . Hypothyroidism   . Macular degeneration   . Psoriatic arthritis (HCC)   . Sinus drainage   . UTI (urinary tract infection)    Past Surgical History:  Procedure Laterality Date  . BACK SURGERY  2000   Dr Newell CoralNudelman  . bladder tack  2005  . COLONOSCOPY    . DILATION AND CURETTAGE OF UTERUS     after miscarriage  . RECTAL PROLAPSE REPAIR  2008  . SCAR REVISION Right 2017   Knee, Dr. Luiz BlareGraves  . TONSILLECTOMY     as a child  . 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 TUBAL LIGATION  1972   Social History   Socioeconomic History  . Marital status: Married    Spouse name: Not on file  . Number of children: Not on file  . Years of education: Not on file  . Highest education level: Not on file  Occupational History  . Not on file  Social Needs  . Financial resource strain: Not on file  . Food insecurity:    Worry: Not on file    Inability: Not on file  . Transportation needs:    Medical: Not on file    Non-medical: Not on file  Tobacco Use  . Smoking status: Never Smoker  . Smokeless tobacco: Never  Used  Substance and Sexual Activity  . Alcohol use: Yes    Comment: rarely wine  . Drug use: No  . Sexual activity: Not on file  Lifestyle  . Physical activity:    Days per week: Not on file    Minutes per session: Not on file  . Stress: Not on file  Relationships  . Social connections:    Talks on phone: Not on file    Gets together: Not on file    Attends religious service: Not on file    Active member of club or organization: Not on file    Attends meetings of clubs or organizations: Not on file    Relationship status: Not on file  Other Topics Concern  . Not on file  Social History Narrative  . Not on file   Family History  Problem Relation Age of Onset  . Breast cancer Neg Hx    Allergies  Allergen Reactions  . Penicillins Rash and Other (See Comments)    Has patient had a PCN reaction causing immediate rash, facial/tongue/throat swelling, SOB or lightheadedness with hypotension: Yes Has patient had a PCN reaction causing severe rash involving mucus membranes or skin necrosis: No Has patient had a PCN reaction that required hospitalization: No  Has patient had a PCN reaction occurring within the last 10 years: No If all of the above answers are "NO", then may proceed with Cephalosporin use.   . Sulfa Antibiotics Rash   Prior to Admission medications   Medication Sig Start Date End Date Taking? Authorizing Provider  Carboxymethylcellulose Sodium (THERATEARS OP) Place 1 drop into both eyes 2 (two) times daily as needed (for dry eyes).   Yes [provider]  conjugated estrogens (PREMARIN) vaginal cream Place 1 Applicatorful vaginally 2 (two) times a week.   Yes [provider]  hydrochlorothiazide (HYDRODIURIL) 25 MG tablet Take 25 mg by mouth daily.   Yes [provider]  irbesartan (AVAPRO) 150 MG tablet Take 150 mg by mouth daily.   Yes [provider]  levothyroxine (SYNTHROID, LEVOTHROID) 75 MCG tablet Take 75 mcg by mouth daily  before breakfast.   Yes [provider]  lovastatin (MEVACOR) 40 MG tablet Take 80 mg by mouth daily.    Yes [provider]  Multiple Vitamins-Minerals (PRESERVISION AREDS 2 PO) Take 1 capsule by mouth 2 (two) times daily.   Yes [provider]  naproxen sodium (ALEVE) 220 MG tablet Take 220 mg by mouth daily.   Yes [provider]  sertraline (ZOLOFT) 100 MG tablet Take 100 mg by mouth daily.   Yes [provider]  aspirin EC 325 MG tablet Take 1 tablet (325 mg total) by mouth 2 (two) times daily after a meal. Patient not taking: Reported on 11/07/2017 09/05/14   Marshia Ly, PA-C  methocarbamol (ROBAXIN) 500 MG tablet Take 1 tablet (500 mg total) by mouth every 8 (eight) hours as needed for muscle spasms. Patient not taking: Reported on 11/07/2017 09/05/14   Marshia Ly, PA-C  oxyCODONE-acetaminophen (PERCOCET/ROXICET) 5-325 MG per tablet Take 1-2 tablets by mouth every 6 (six) hours as needed for severe pain. Patient not taking: Reported on 11/07/2017 09/05/14   Marshia Ly, PA-C     Positive ROS: none  All other systems have been reviewed and were otherwise negative with the exception of those mentioned in the HPI and as above.  Physical Exam: There were no vitals filed for this visit.  General: Alert, no acute distress Cardiovascular: No pedal edema Respiratory: No cyanosis, no use of accessory musculature GI: No organomegaly, abdomen is soft and non-tender Skin: No lesions in the area of chief complaint Neurologic: Sensation intact distally Psychiatric: Patient is competent for consent with normal mood and affect Lymphatic: No axillary or cervical lymphadenopathy  MUSCULOSKELETAL: Left shoulder: Painful range of motion.  Limited range of motion.  Limited external rotation.  Pain with overhead elevation.  Assessment/Plan: LEFT SHOULDER DEGENERATIVE JOINT DISEASE Plan for Procedure(s): LEFT TOTAL SHOULDER ARTHROPLASTY  The  risks benefits and alternatives were discussed with the patient including but not limited to the risks of nonoperative treatment, versus surgical intervention including infection, bleeding, nerve injury, malunion, nonunion, hardware prominence, hardware failure, need for hardware removal, blood clots, cardiopulmonary complications, morbidity, mortality, among others, and they were willing to proceed.  Predicted outcome is good, although there will be at least a six to nine month expected recovery.  There is been no change in the history and physical examination overnight. Harvie Junior, MD 11/16/2017 6:54 PM

## 2017-11-17 ENCOUNTER — Encounter (HOSPITAL_COMMUNITY)
Admission: RE | Disposition: A | Discharge status: Home / Self Care | Payer: Self-pay | Source: Ambulatory Visit | Attending: Orthopedic Surgery

## 2017-11-17 ENCOUNTER — Inpatient Hospital Stay (HOSPITAL_COMMUNITY): Payer: Medicare Other

## 2017-11-17 ENCOUNTER — Inpatient Hospital Stay (HOSPITAL_COMMUNITY): Payer: Medicare Other | Admitting: Anesthesiology

## 2017-11-17 ENCOUNTER — Encounter (HOSPITAL_COMMUNITY): Payer: Self-pay | Admitting: *Deleted

## 2017-11-17 ENCOUNTER — Inpatient Hospital Stay (HOSPITAL_COMMUNITY)
Admission: RE | Admit: 2017-11-17 | Discharge: 2017-11-18 | DRG: 483 | Disposition: A | Discharge status: Home / Self Care | Payer: Medicare Other | Source: Ambulatory Visit | Attending: Orthopedic Surgery | Admitting: Orthopedic Surgery

## 2017-11-17 ENCOUNTER — Other Ambulatory Visit: Payer: Self-pay

## 2017-11-17 DIAGNOSIS — Z96612 Presence of left artificial shoulder joint: Secondary | ICD-10-CM

## 2017-11-17 DIAGNOSIS — Z9851 Tubal ligation status: Secondary | ICD-10-CM | POA: Diagnosis not present

## 2017-11-17 DIAGNOSIS — Z01818 Encounter for other preprocedural examination: Secondary | ICD-10-CM

## 2017-11-17 DIAGNOSIS — Z96651 Presence of right artificial knee joint: Secondary | ICD-10-CM | POA: Diagnosis present

## 2017-11-17 DIAGNOSIS — H3530 Unspecified macular degeneration: Secondary | ICD-10-CM | POA: Diagnosis present

## 2017-11-17 DIAGNOSIS — K7689 Other specified diseases of liver: Secondary | ICD-10-CM | POA: Diagnosis present

## 2017-11-17 DIAGNOSIS — Z7982 Long term (current) use of aspirin: Secondary | ICD-10-CM | POA: Diagnosis not present

## 2017-11-17 DIAGNOSIS — Z79899 Other long term (current) drug therapy: Secondary | ICD-10-CM | POA: Diagnosis not present

## 2017-11-17 DIAGNOSIS — Z882 Allergy status to sulfonamides status: Secondary | ICD-10-CM

## 2017-11-17 DIAGNOSIS — F329 Major depressive disorder, single episode, unspecified: Secondary | ICD-10-CM | POA: Diagnosis present

## 2017-11-17 DIAGNOSIS — E039 Hypothyroidism, unspecified: Secondary | ICD-10-CM | POA: Diagnosis present

## 2017-11-17 DIAGNOSIS — Z79890 Hormone replacement therapy (postmenopausal): Secondary | ICD-10-CM | POA: Diagnosis not present

## 2017-11-17 DIAGNOSIS — Z880 Allergy status to penicillin: Secondary | ICD-10-CM

## 2017-11-17 DIAGNOSIS — Z87440 Personal history of urinary (tract) infections: Secondary | ICD-10-CM

## 2017-11-17 DIAGNOSIS — L4050 Arthropathic psoriasis, unspecified: Secondary | ICD-10-CM | POA: Diagnosis present

## 2017-11-17 DIAGNOSIS — I10 Essential (primary) hypertension: Secondary | ICD-10-CM | POA: Diagnosis present

## 2017-11-17 DIAGNOSIS — Z803 Family history of malignant neoplasm of breast: Secondary | ICD-10-CM | POA: Diagnosis not present

## 2017-11-17 DIAGNOSIS — M19012 Primary osteoarthritis, left shoulder: Secondary | ICD-10-CM | POA: Diagnosis present

## 2017-11-17 DIAGNOSIS — Z9889 Other specified postprocedural states: Secondary | ICD-10-CM

## 2017-11-17 HISTORY — PX: TOTAL SHOULDER ARTHROPLASTY: SHX126

## 2017-11-17 LAB — TYPE AND SCREEN
ABO/RH(D): O POS
ANTIBODY SCREEN: NEGATIVE

## 2017-11-17 SURGERY — ARTHROPLASTY, SHOULDER, TOTAL
Anesthesia: General | Site: Shoulder | Laterality: Left

## 2017-11-17 MED ORDER — HYDROCHLOROTHIAZIDE 25 MG PO TABS
25.0000 mg | ORAL_TABLET | Freq: Every day | ORAL | Status: DC
  Administered 2017-11-18: 25 mg via ORAL
  Filled 2017-11-17: qty 1

## 2017-11-17 MED ORDER — METHOCARBAMOL 500 MG PO TABS: 500.0000 mg | ORAL_TABLET | Freq: Four times a day (QID) | ORAL | Status: DC | PRN

## 2017-11-17 MED ORDER — METOCLOPRAMIDE HCL 5 MG PO TABS: 5.0000 mg | ORAL_TABLET | Freq: Three times a day (TID) | ORAL | Status: DC | PRN

## 2017-11-17 MED ORDER — ZOLPIDEM TARTRATE 5 MG PO TABS: 5.0000 mg | ORAL_TABLET | Freq: Every evening | ORAL | Status: DC | PRN

## 2017-11-17 MED ORDER — LIDOCAINE 2% (20 MG/ML) 5 ML SYRINGE
INTRAMUSCULAR | Status: AC
Start: 2017-11-17 — End: 2017-11-17
  Filled 2017-11-17: qty 5

## 2017-11-17 MED ORDER — DEXAMETHASONE SODIUM PHOSPHATE 10 MG/ML IJ SOLN
INTRAMUSCULAR | Status: DC | PRN
  Administered 2017-11-17: 10 mg via INTRAVENOUS

## 2017-11-17 MED ORDER — DIPHENHYDRAMINE HCL 12.5 MG/5ML PO ELIX: 12.5000 mg | ORAL_SOLUTION | ORAL | Status: DC | PRN

## 2017-11-17 MED ORDER — CLINDAMYCIN PHOSPHATE 900 MG/50ML IV SOLN
900.0000 mg | INTRAVENOUS | Status: AC
  Administered 2017-11-17: 900 mg via INTRAVENOUS
  Filled 2017-11-17: qty 50

## 2017-11-17 MED ORDER — DEXAMETHASONE SODIUM PHOSPHATE 10 MG/ML IJ SOLN
INTRAMUSCULAR | Status: AC
  Filled 2017-11-17: qty 1

## 2017-11-17 MED ORDER — SUGAMMADEX SODIUM 200 MG/2ML IV SOLN
INTRAVENOUS | Status: DC | PRN
  Administered 2017-11-17: 200 mg via INTRAVENOUS

## 2017-11-17 MED ORDER — ONDANSETRON HCL 4 MG/2ML IJ SOLN
INTRAMUSCULAR | Status: AC
  Filled 2017-11-17: qty 2

## 2017-11-17 MED ORDER — MIDAZOLAM HCL 2 MG/2ML IJ SOLN
INTRAMUSCULAR | Status: AC
  Filled 2017-11-17: qty 2

## 2017-11-17 MED ORDER — BUPIVACAINE LIPOSOME 1.3 % IJ SUSP
20.0000 mL | Freq: Once | INTRAMUSCULAR | Status: AC
  Filled 2017-11-17 (×2): qty 20

## 2017-11-17 MED ORDER — TRANEXAMIC ACID 1000 MG/10ML IV SOLN
1000.0000 mg | Freq: Once | INTRAVENOUS | Status: AC
  Administered 2017-11-17: 1000 mg via INTRAVENOUS
  Filled 2017-11-17: qty 10

## 2017-11-17 MED ORDER — FENTANYL CITRATE (PF) 100 MCG/2ML IJ SOLN
INTRAMUSCULAR | Status: DC | PRN
  Administered 2017-11-17 (×5): 50 ug via INTRAVENOUS

## 2017-11-17 MED ORDER — METHOCARBAMOL 500 MG PO TABS: 500.0000 mg | ORAL_TABLET | Freq: Three times a day (TID) | ORAL | Status: DC | PRN

## 2017-11-17 MED ORDER — STERILE WATER FOR IRRIGATION IR SOLN
Status: DC | PRN
Start: 2017-11-17 — End: 2017-11-17
  Administered 2017-11-17: 2000 mL

## 2017-11-17 MED ORDER — FENTANYL CITRATE (PF) 100 MCG/2ML IJ SOLN
50.0000 ug | INTRAMUSCULAR | Status: DC
  Administered 2017-11-17: 50 ug via INTRAVENOUS

## 2017-11-17 MED ORDER — DOCUSATE SODIUM 100 MG PO CAPS
100.0000 mg | ORAL_CAPSULE | Freq: Two times a day (BID) | ORAL | Status: DC
  Administered 2017-11-17 – 2017-11-18 (×2): 100 mg via ORAL
  Filled 2017-11-17 (×2): qty 1

## 2017-11-17 MED ORDER — MENTHOL 3 MG MT LOZG: 1.0000 | LOZENGE | OROMUCOSAL | Status: DC | PRN

## 2017-11-17 MED ORDER — MORPHINE SULFATE (PF) 2 MG/ML IV SOLN: 0.5000 mg | INTRAVENOUS | Status: DC | PRN

## 2017-11-17 MED ORDER — METHOCARBAMOL 1000 MG/10ML IJ SOLN
500.0000 mg | Freq: Four times a day (QID) | INTRAVENOUS | Status: DC | PRN
  Administered 2017-11-17: 500 mg via INTRAVENOUS
  Filled 2017-11-17: qty 550

## 2017-11-17 MED ORDER — LEVOTHYROXINE SODIUM 75 MCG PO TABS
75.0000 ug | ORAL_TABLET | Freq: Every day | ORAL | Status: DC
  Administered 2017-11-18: 75 ug via ORAL
  Filled 2017-11-17: qty 1

## 2017-11-17 MED ORDER — PHENYLEPHRINE HCL 10 MG/ML IJ SOLN
INTRAMUSCULAR | Status: DC | PRN
  Administered 2017-11-17 (×2): 120 ug via INTRAVENOUS

## 2017-11-17 MED ORDER — ASPIRIN EC 325 MG PO TBEC: 325.0000 mg | DELAYED_RELEASE_TABLET | Freq: Every day | ORAL | Status: DC

## 2017-11-17 MED ORDER — BUPIVACAINE HCL (PF) 0.5 % IJ SOLN
INTRAMUSCULAR | Status: DC | PRN
  Administered 2017-11-17: 10 mL via PERINEURAL

## 2017-11-17 MED ORDER — SENNOSIDES-DOCUSATE SODIUM 8.6-50 MG PO TABS: 1.0000 | ORAL_TABLET | Freq: Every evening | ORAL | Status: DC | PRN

## 2017-11-17 MED ORDER — PRAVASTATIN SODIUM 20 MG PO TABS
40.0000 mg | ORAL_TABLET | Freq: Every day | ORAL | Status: DC
  Administered 2017-11-17: 40 mg via ORAL
  Filled 2017-11-17: qty 2

## 2017-11-17 MED ORDER — ROCURONIUM BROMIDE 10 MG/ML (PF) SYRINGE
PREFILLED_SYRINGE | INTRAVENOUS | Status: DC | PRN
  Administered 2017-11-17: 35 mg via INTRAVENOUS
  Administered 2017-11-17: 5 mg via INTRAVENOUS
  Administered 2017-11-17: 10 mg via INTRAVENOUS

## 2017-11-17 MED ORDER — MIDAZOLAM HCL 2 MG/2ML IJ SOLN
1.0000 mg | INTRAMUSCULAR | Status: DC
Start: 2017-11-17 — End: 2017-11-18

## 2017-11-17 MED ORDER — BUPIVACAINE LIPOSOME 1.3 % IJ SUSP
INTRAMUSCULAR | Status: DC | PRN
  Administered 2017-11-17: 10 mL via PERINEURAL

## 2017-11-17 MED ORDER — BUPIVACAINE HCL (PF) 0.25 % IJ SOLN
INTRAMUSCULAR | Status: AC
  Filled 2017-11-17: qty 30

## 2017-11-17 MED ORDER — FENTANYL CITRATE (PF) 100 MCG/2ML IJ SOLN: 25.0000 ug | INTRAMUSCULAR | Status: DC | PRN

## 2017-11-17 MED ORDER — OXYCODONE-ACETAMINOPHEN 5-325 MG PO TABS
1.0000 | ORAL_TABLET | Freq: Four times a day (QID) | ORAL | Status: DC | PRN
Start: 2017-11-17 — End: 2017-11-18

## 2017-11-17 MED ORDER — PHENYLEPHRINE 40 MCG/ML (10ML) SYRINGE FOR IV PUSH (FOR BLOOD PRESSURE SUPPORT)
PREFILLED_SYRINGE | INTRAVENOUS | Status: AC
  Filled 2017-11-17: qty 10

## 2017-11-17 MED ORDER — LACTATED RINGERS IV SOLN
INTRAVENOUS | Status: DC
  Administered 2017-11-17 (×4): via INTRAVENOUS

## 2017-11-17 MED ORDER — PROPOFOL 10 MG/ML IV BOLUS
INTRAVENOUS | Status: DC | PRN
  Administered 2017-11-17: 110 mg via INTRAVENOUS

## 2017-11-17 MED ORDER — ONDANSETRON HCL 4 MG/2ML IJ SOLN: 4.0000 mg | Freq: Four times a day (QID) | INTRAMUSCULAR | Status: DC | PRN

## 2017-11-17 MED ORDER — TRAMADOL HCL 50 MG PO TABS
50.0000 mg | ORAL_TABLET | Freq: Four times a day (QID) | ORAL | Status: DC
  Administered 2017-11-17: 50 mg via ORAL
  Filled 2017-11-17: qty 1

## 2017-11-17 MED ORDER — CIPROFLOXACIN IN D5W 400 MG/200ML IV SOLN
INTRAVENOUS | Status: AC
  Filled 2017-11-17: qty 200

## 2017-11-17 MED ORDER — PHENYLEPHRINE HCL 10 MG/ML IJ SOLN
INTRAMUSCULAR | Status: AC
  Filled 2017-11-17: qty 1

## 2017-11-17 MED ORDER — BUPIVACAINE LIPOSOME 1.3 % IJ SUSP
INTRAMUSCULAR | Status: DC | PRN
  Administered 2017-11-17: 10 mL

## 2017-11-17 MED ORDER — FENTANYL CITRATE (PF) 100 MCG/2ML IJ SOLN
INTRAMUSCULAR | Status: AC
  Administered 2017-11-17: 50 ug via INTRAVENOUS
  Filled 2017-11-17: qty 2

## 2017-11-17 MED ORDER — PROPOFOL 10 MG/ML IV BOLUS
INTRAVENOUS | Status: AC
  Filled 2017-11-17: qty 20

## 2017-11-17 MED ORDER — CARBOXYMETHYLCELLULOSE SODIUM 0.25 % OP SOLN: Freq: Two times a day (BID) | OPHTHALMIC | Status: DC | PRN

## 2017-11-17 MED ORDER — HYDROCODONE-ACETAMINOPHEN 5-325 MG PO TABS: 1.0000 | ORAL_TABLET | ORAL | Status: DC | PRN

## 2017-11-17 MED ORDER — CLINDAMYCIN PHOSPHATE 600 MG/50ML IV SOLN
600.0000 mg | Freq: Four times a day (QID) | INTRAVENOUS | Status: AC
  Administered 2017-11-17 – 2017-11-18 (×3): 600 mg via INTRAVENOUS
  Filled 2017-11-17 (×3): qty 50

## 2017-11-17 MED ORDER — ROCURONIUM BROMIDE 10 MG/ML (PF) SYRINGE
PREFILLED_SYRINGE | INTRAVENOUS | Status: AC
  Filled 2017-11-17: qty 5

## 2017-11-17 MED ORDER — ASPIRIN EC 325 MG PO TBEC
325.0000 mg | DELAYED_RELEASE_TABLET | Freq: Two times a day (BID) | ORAL | Status: DC
  Administered 2017-11-17 – 2017-11-18 (×2): 325 mg via ORAL
  Filled 2017-11-17 (×2): qty 1

## 2017-11-17 MED ORDER — ONDANSETRON HCL 4 MG/2ML IJ SOLN
INTRAMUSCULAR | Status: DC | PRN
  Administered 2017-11-17: 4 mg via INTRAVENOUS

## 2017-11-17 MED ORDER — ONDANSETRON HCL 4 MG/2ML IJ SOLN: 4.0000 mg | Freq: Once | INTRAMUSCULAR | Status: DC | PRN

## 2017-11-17 MED ORDER — FENTANYL CITRATE (PF) 250 MCG/5ML IJ SOLN
INTRAMUSCULAR | Status: AC
  Filled 2017-11-17: qty 5

## 2017-11-17 MED ORDER — ONDANSETRON HCL 4 MG/2ML IJ SOLN
INTRAMUSCULAR | Status: AC
Start: 2017-11-17 — End: 2017-11-17
  Filled 2017-11-17: qty 2

## 2017-11-17 MED ORDER — SERTRALINE HCL 100 MG PO TABS
100.0000 mg | ORAL_TABLET | Freq: Every day | ORAL | Status: DC
  Administered 2017-11-17 – 2017-11-18 (×2): 100 mg via ORAL
  Filled 2017-11-17 (×2): qty 1

## 2017-11-17 MED ORDER — LIDOCAINE 2% (20 MG/ML) 5 ML SYRINGE
INTRAMUSCULAR | Status: DC | PRN
  Administered 2017-11-17: 75 mg via INTRAVENOUS
  Administered 2017-11-17: 25 mg via INTRAVENOUS

## 2017-11-17 MED ORDER — ACETAMINOPHEN 500 MG PO TABS
500.0000 mg | ORAL_TABLET | Freq: Four times a day (QID) | ORAL | Status: DC
  Administered 2017-11-17 – 2017-11-18 (×2): 500 mg via ORAL
  Filled 2017-11-17 (×2): qty 1

## 2017-11-17 MED ORDER — ALBUMIN HUMAN 5 % IV SOLN
INTRAVENOUS | Status: DC | PRN
  Administered 2017-11-17: 13:00:00 via INTRAVENOUS

## 2017-11-17 MED ORDER — POLYVINYL ALCOHOL 1.4 % OP SOLN
1.0000 [drp] | Freq: Two times a day (BID) | OPHTHALMIC | Status: DC | PRN
  Filled 2017-11-17: qty 15

## 2017-11-17 MED ORDER — 0.9 % SODIUM CHLORIDE (POUR BTL) OPTIME
TOPICAL | Status: DC | PRN
  Administered 2017-11-17: 1000 mL

## 2017-11-17 MED ORDER — PHENYLEPHRINE HCL 10 MG/ML IJ SOLN
INTRAVENOUS | Status: DC | PRN
  Administered 2017-11-17: 25 ug/min via INTRAVENOUS

## 2017-11-17 MED ORDER — CHLORHEXIDINE GLUCONATE 4 % EX LIQD: 60.0000 mL | Freq: Once | CUTANEOUS | Status: DC

## 2017-11-17 MED ORDER — ACETAMINOPHEN 325 MG PO TABS: 325.0000 mg | ORAL_TABLET | Freq: Four times a day (QID) | ORAL | Status: DC | PRN

## 2017-11-17 MED ORDER — SUCCINYLCHOLINE CHLORIDE 200 MG/10ML IV SOSY
PREFILLED_SYRINGE | INTRAVENOUS | Status: DC | PRN
  Administered 2017-11-17: 120 mg via INTRAVENOUS

## 2017-11-17 MED ORDER — ONDANSETRON HCL 4 MG PO TABS: 4.0000 mg | ORAL_TABLET | Freq: Four times a day (QID) | ORAL | Status: DC | PRN

## 2017-11-17 MED ORDER — ESTROGENS, CONJUGATED 0.625 MG/GM VA CREA
1.0000 | TOPICAL_CREAM | VAGINAL | Status: DC
  Filled 2017-11-17: qty 30

## 2017-11-17 MED ORDER — PHENOL 1.4 % MT LIQD
1.0000 | OROMUCOSAL | Status: DC | PRN
  Filled 2017-11-17: qty 177

## 2017-11-17 MED ORDER — IRBESARTAN 150 MG PO TABS
150.0000 mg | ORAL_TABLET | Freq: Every day | ORAL | Status: DC
  Administered 2017-11-17 – 2017-11-18 (×2): 150 mg via ORAL
  Filled 2017-11-17 (×2): qty 1

## 2017-11-17 MED ORDER — BUPIVACAINE HCL (PF) 0.25 % IJ SOLN
INTRAMUSCULAR | Status: DC | PRN
  Administered 2017-11-17: 30 mL

## 2017-11-17 MED ORDER — METOCLOPRAMIDE HCL 5 MG/ML IJ SOLN: 5.0000 mg | Freq: Three times a day (TID) | INTRAMUSCULAR | Status: DC | PRN

## 2017-11-17 MED ORDER — HYDROCODONE-ACETAMINOPHEN 7.5-325 MG PO TABS: 1.0000 | ORAL_TABLET | ORAL | Status: DC | PRN

## 2017-11-17 MED ORDER — CIPROFLOXACIN IN D5W 400 MG/200ML IV SOLN
400.0000 mg | Freq: Once | INTRAVENOUS | Status: AC
  Administered 2017-11-17: 400 mg via INTRAVENOUS

## 2017-11-17 SURGICAL SUPPLY — 72 items
APL SKNCLS STERI-STRIP NONHPOA (GAUZE/BANDAGES/DRESSINGS) ×1
BAG DECANTER FOR FLEXI CONT (MISCELLANEOUS) IMPLANT
BENZOIN TINCTURE PRP APPL 2/3 (GAUZE/BANDAGES/DRESSINGS) ×3 IMPLANT
BLADE CLIPPER SURG (BLADE) IMPLANT
BLADE HEX COATED 2.75 (ELECTRODE) ×3 IMPLANT
BLADE SAW SAG 73X25 THK (BLADE) ×2
BLADE SAW SGTL 18X1.27X75 (BLADE) ×2 IMPLANT
BLADE SAW SGTL 18X1.27X75MM (BLADE) ×1
BLADE SAW SGTL 73X25 THK (BLADE) ×1 IMPLANT
BOWL SMART MIX CTS (DISPOSABLE) IMPLANT
BUR OVAL 4.0 (BURR) IMPLANT
CAPT SHLDR TOTAL 2 ×3 IMPLANT
CEMENT BONE DEPUY (Cement) ×3 IMPLANT
CLOSURE WOUND 1/2 X4 (GAUZE/BANDAGES/DRESSINGS) ×2
COVER BACK TABLE 60X90IN (DRAPES) ×3 IMPLANT
COVER SURGICAL LIGHT HANDLE (MISCELLANEOUS) ×3 IMPLANT
DRAPE C-ARM 42X120 X-RAY (DRAPES) IMPLANT
DRAPE INCISE IOBAN 66X45 STRL (DRAPES) ×3 IMPLANT
DRAPE U-SHAPE 47X51 STRL (DRAPES) ×6 IMPLANT
DRESSING AQUACEL AG SP 3.5X10 (GAUZE/BANDAGES/DRESSINGS) ×2 IMPLANT
DRSG AQUACEL AG SP 3.5X10 (GAUZE/BANDAGES/DRESSINGS) ×6
DRSG PAD ABDOMINAL 8X10 ST (GAUZE/BANDAGES/DRESSINGS) IMPLANT
ELECT BLADE TIP CTD 4 INCH (ELECTRODE) ×3 IMPLANT
ELECT COATED BLADE 2.86 ST (ELECTRODE) ×3 IMPLANT
ELECT NEEDLE TIP 2.8 STRL (NEEDLE) ×3 IMPLANT
ELECT REM PT RETURN 15FT ADLT (MISCELLANEOUS) ×3 IMPLANT
EVACUATOR 1/8 PVC DRAIN (DRAIN) IMPLANT
EVACUATOR SILICONE 100CC (DRAIN) IMPLANT
GAUZE SPONGE 4X4 12PLY STRL (GAUZE/BANDAGES/DRESSINGS) ×3 IMPLANT
GAUZE XEROFORM 5X9 LF (GAUZE/BANDAGES/DRESSINGS) ×3 IMPLANT
GLOVE BIO SURGEON STRL SZ8 (GLOVE) IMPLANT
GLOVE BIOGEL PI IND STRL 8 (GLOVE) ×1 IMPLANT
GLOVE BIOGEL PI INDICATOR 8 (GLOVE) ×2
GLOVE ECLIPSE 7.5 STRL STRAW (GLOVE) ×3 IMPLANT
GOWN STRL REUS W/ TWL LRG LVL3 (GOWN DISPOSABLE) ×1 IMPLANT
GOWN STRL REUS W/TWL LRG LVL3 (GOWN DISPOSABLE) ×2
HANDPIECE INTERPULSE COAX TIP (DISPOSABLE)
HOOD PEEL AWAY FLYTE STAYCOOL (MISCELLANEOUS) ×6 IMPLANT
KIT BASIN OR (CUSTOM PROCEDURE TRAY) ×3 IMPLANT
KIT POSITIONER SHLDR ASSISTARM (MISCELLANEOUS) ×3 IMPLANT
MANIFOLD NEPTUNE II (INSTRUMENTS) ×3 IMPLANT
NEEDLE HYPO 22GX1.5 SAFETY (NEEDLE) IMPLANT
NEEDLE MAYO 6 CRC TAPER PT (NEEDLE) ×3 IMPLANT
NS IRRIG 1000ML POUR BTL (IV SOLUTION) ×3 IMPLANT
PACK SHOULDER (CUSTOM PROCEDURE TRAY) ×3 IMPLANT
PACK UNIVERSAL I (CUSTOM PROCEDURE TRAY) IMPLANT
PASSER SUT SWANSON 36MM LOOP (INSTRUMENTS) IMPLANT
PIN METAGLENE 2.5 (PIN) ×3 IMPLANT
POSITIONER SURGICAL ARM (MISCELLANEOUS) ×3 IMPLANT
PRESSURIZER FEMORAL UNIV (MISCELLANEOUS) IMPLANT
SET HNDPC FAN SPRY TIP SCT (DISPOSABLE) IMPLANT
SLING ARM IMMOBILIZER LRG (SOFTGOODS) ×3 IMPLANT
SMARTMIX MINI TOWER (MISCELLANEOUS) ×3
SPONGE LAP 18X18 RF (DISPOSABLE) ×6 IMPLANT
SPONGE LAP 4X18 RFD (DISPOSABLE) ×3 IMPLANT
STAPLER VISISTAT 35W (STAPLE) IMPLANT
STRIP CLOSURE SKIN 1/2X4 (GAUZE/BANDAGES/DRESSINGS) ×4 IMPLANT
SUCTION FRAZIER HANDLE 10FR (MISCELLANEOUS) ×2
SUCTION TUBE FRAZIER 10FR DISP (MISCELLANEOUS) ×1 IMPLANT
SUT FIBERWIRE #2 38 T-5 BLUE (SUTURE) ×18
SUT MNCRL AB 4-0 PS2 18 (SUTURE) ×3 IMPLANT
SUT VIC AB 0 CT1 36 (SUTURE) ×3 IMPLANT
SUT VIC AB 2-0 CT1 27 (SUTURE) ×6
SUT VIC AB 2-0 CT1 TAPERPNT 27 (SUTURE) ×2 IMPLANT
SUTURE FIBERWR #2 38 T-5 BLUE (SUTURE) ×6 IMPLANT
SYR CONTROL 10ML LL (SYRINGE) ×3 IMPLANT
TOWEL OR 17X26 10 PK STRL BLUE (TOWEL DISPOSABLE) ×3 IMPLANT
TOWER CARTRIDGE SMART MIX (DISPOSABLE) IMPLANT
TOWER SMARTMIX MINI (MISCELLANEOUS) ×1 IMPLANT
TRAY CATH 16FR W/PLASTIC CATH (SET/KITS/TRAYS/PACK) ×3 IMPLANT
TRAY FOLEY W/METER SILVER 16FR (SET/KITS/TRAYS/PACK) IMPLANT
WATER STERILE IRR 1000ML POUR (IV SOLUTION) IMPLANT

## 2017-11-17 NOTE — Transfer of Care (Signed)
Immediate Anesthesia Transfer of Care Note  Patient: Connie BombardJudith D [name]  Procedure(s) Performed: LEFT TOTAL SHOULDER ARTHROPLASTY (Left Shoulder)  Patient Location: PACU  Anesthesia Type:General  Level of Consciousness: awake, alert , oriented and patient cooperative  Airway & Oxygen Therapy: Patient Spontanous Breathing and Patient connected to face mask oxygen  Post-op Assessment: Report given to RN, Post -op Vital signs reviewed and stable and Patient moving all extremities X 4  Post vital signs: stable  Last Vitals:  Vitals Value Taken Time  BP 136/66 11/17/2017  2:45 PM  Temp    Pulse 90 11/17/2017  2:48 PM  Resp 10 11/17/2017  2:48 PM  SpO2 100 % 11/17/2017  2:48 PM  Vitals shown include unvalidated device data.  Last Pain:  Vitals:   11/17/17 1440  TempSrc:   PainSc: (P) 0-No pain      Patients Stated Pain Goal: 4 (11/17/17 0803)  Complications: No apparent anesthesia complications

## 2017-11-17 NOTE — Brief Op Note (Signed)
11/17/2017  1:48 PM  PATIENT:  Penni BombardJudith D Kydd  77 y.o. female  PRE-OPERATIVE DIAGNOSIS:  LEFT SHOULDER DEGENERATIVE JOINT DISEASE  POST-OPERATIVE DIAGNOSIS:  * No post-op diagnosis entered *  PROCEDURE:  Procedure(s): LEFT TOTAL SHOULDER ARTHROPLASTY (Left)  SURGEON:  Surgeon(s) and Role:    Jodi Geralds* Meric Joye, MD - Primary  PHYSICIAN ASSISTANT:   ASSISTANTS: none   ANESTHESIA:   general  EBL:  100 mL   BLOOD ADMINISTERED:none  DRAINS: none   LOCAL MEDICATIONS USED:  MARCAINE    and OTHER experel  SPECIMEN:  No Specimen  DISPOSITION OF SPECIMEN:  N/A  COUNTS:  YES  TOURNIQUET:  * No tourniquets in log *  DICTATION: .Other Dictation: Dictation Number [id number]  PLAN OF CARE: Admit to inpatient   PATIENT DISPOSITION:  PACU - hemodynamically stable.   Delay start of Pharmacological VTE agent (>24hrs) due to surgical blood loss or risk of bleeding: no

## 2017-11-17 NOTE — Anesthesia Procedure Notes (Addendum)
Procedure Name: Intubation Date/Time: 11/17/2017 10:45 AM Performed by: Lissa Morales, CRNA Pre-anesthesia Checklist: Patient identified, Emergency Drugs available, Suction available and Patient being monitored Patient Re-evaluated:Patient Re-evaluated prior to induction Oxygen Delivery Method: Circle system utilized Preoxygenation: Pre-oxygenation with 100% oxygen Induction Type: IV induction Ventilation: Mask ventilation without difficulty Laryngoscope Size: Mac and 4 Grade View: Grade II Tube type: Oral Tube size: 7.0 mm Number of attempts: 1 Airway Equipment and Method: Stylet and Oral airway Placement Confirmation: ETT inserted through vocal cords under direct vision,  positive ETCO2 and breath sounds checked- equal and bilateral Secured at: 21 cm Tube secured with: Tape Dental Injury: Teeth and Oropharynx as per pre-operative assessment

## 2017-11-17 NOTE — Anesthesia Preprocedure Evaluation (Addendum)
Anesthesia Evaluation  Patient identified by MRN, date of birth, ID band Patient awake    Reviewed: Allergy & Precautions, NPO status , Patient's Chart, lab work & pertinent test results  Airway Mallampati: II  TM Distance: <3 FB Neck ROM: Full    Dental  (+) Teeth Intact, Dental Advisory Given   Pulmonary neg pulmonary ROS,    Pulmonary exam normal breath sounds clear to auscultation       Cardiovascular hypertension, Pt. on medications (-) angina(-) Past MI Normal cardiovascular exam Rhythm:Regular Rate:Normal     Neuro/Psych PSYCHIATRIC DISORDERS Depression negative neurological ROS     GI/Hepatic Neg liver ROS, hiatal hernia, GERD  ,  Endo/Other  Hypothyroidism Obesity   Renal/GU negative Renal ROS     Musculoskeletal  (+) Arthritis , Osteoarthritis,    Abdominal   Peds  Hematology negative hematology ROS (+)   Anesthesia Other Findings Day of surgery medications reviewed with the patient.  Reproductive/Obstetrics                            Anesthesia Physical Anesthesia Plan  ASA: III  Anesthesia Plan: General   Post-op Pain Management:  Regional for Post-op pain   Induction: Intravenous  PONV Risk Score and Plan: 3 and Dexamethasone, Ondansetron and Treatment may vary due to age or medical condition  Airway Management Planned: Oral ETT  Additional Equipment:   Intra-op Plan:   Post-operative Plan: Extubation in OR  Informed Consent: I have reviewed the patients History and Physical, chart, labs and discussed the procedure including the risks, benefits and alternatives for the proposed anesthesia with the patient or authorized representative who has indicated his/her understanding and acceptance.   Dental advisory given  Plan Discussed with: CRNA  Anesthesia Plan Comments:        Anesthesia Quick Evaluation

## 2017-11-17 NOTE — Anesthesia Postprocedure Evaluation (Signed)
Anesthesia Post Note  Patient: Connie BombardJudith D [name]  Procedure(s) Performed: LEFT TOTAL SHOULDER ARTHROPLASTY (Left Shoulder)     Patient location during evaluation: PACU Anesthesia Type: General Level of consciousness: awake and alert Pain management: pain level controlled Vital Signs Assessment: post-procedure vital signs reviewed and stable Respiratory status: spontaneous breathing, nonlabored ventilation and respiratory function stable Cardiovascular status: blood pressure returned to baseline and stable Postop Assessment: no apparent nausea or vomiting Anesthetic complications: no    Last Vitals:  Vitals:   11/17/17 1745 11/17/17 1845  BP: 118/72 116/74  Pulse: 89 79  Resp: 15 16  Temp: 37 C 37.1 C  SpO2: 98% 99%    Last Pain:  Vitals:   11/17/17 1845  TempSrc: Oral  PainSc:                  Connie HearingStephen Edward Mechille [name]

## 2017-11-17 NOTE — Anesthesia Procedure Notes (Signed)
Anesthesia Regional Block: Interscalene brachial plexus block   Pre-Anesthetic Checklist: ,, timeout performed, Correct Patient, Correct Site, Correct Laterality, Correct Procedure, Correct Position, site marked, Risks and benefits discussed,  Surgical consent,  Pre-op evaluation,  At surgeon's request and post-op pain management  Laterality: Left  Prep: chloraprep       Needles:  Injection technique: Single-shot  Needle Type: Echogenic Stimulator Needle     Needle Length: 5cm  Needle Gauge: 22     Additional Needles:   Procedures:,,,, ultrasound used (permanent image in chart),,,,  Narrative:  Start time: 11/17/2017 9:15 AM End time: 11/17/2017 9:20 AM Injection made incrementally with aspirations every 5 mL.  Performed by: Personally  Anesthesiologist: Cecile Hearingurk, Walburga Hudman Edward, MD  Additional Notes: Functioning IV was confirmed and monitors were applied.  A 50mm 22ga Arrow echogenic stimulator needle was used. Sterile prep and drape, hand hygiene, and sterile gloves were used.  Negative aspiration and negative test dose prior to incremental administration of local anesthetic. The patient tolerated the procedure well.  Ultrasound guidance: relevent anatomy identified, needle position confirmed, local anesthetic spread visualized around nerve(s), vascular puncture avoided.  Image printed for medical record.

## 2017-11-17 NOTE — Progress Notes (Signed)
Assisted Dr. Turk with left, ultrasound guided, interscalene  block. Side rails up, monitors on throughout procedure. See vital signs in flow sheet. Tolerated Procedure well. 

## 2017-11-18 MED ORDER — OXYCODONE-ACETAMINOPHEN 5-325 MG PO TABS: 1.0000 | ORAL_TABLET | Freq: Four times a day (QID) | ORAL | Status: DC | PRN

## 2017-11-18 MED ORDER — HYDROCODONE-ACETAMINOPHEN 5-325 MG PO TABS: 1.0000 | ORAL_TABLET | Freq: Four times a day (QID) | ORAL | 0 refills | Status: DC | PRN

## 2017-11-18 MED ORDER — ASPIRIN EC 325 MG PO TBEC: 325.0000 mg | DELAYED_RELEASE_TABLET | Freq: Two times a day (BID) | ORAL | 0 refills | Status: DC

## 2017-11-18 MED ORDER — TIZANIDINE HCL 2 MG PO TABS: 2.0000 mg | ORAL_TABLET | Freq: Four times a day (QID) | ORAL | 0 refills | Status: DC | PRN

## 2017-11-18 NOTE — Evaluation (Signed)
Occupational Therapy Evaluation Patient Details Name: Connie [name] MRN: [id number] DOB: 1940/10/13 Today's Date: 11/18/2017    History of Present Illness LEFT TOTAL SHOULDER ARTHROPLASTY    Clinical Impression   OT eval and education complete    Follow Up Recommendations  Follow surgeon's recommendation for DC plan and follow-up therapies    Equipment Recommendations  None recommended by OT       Precautions / Restrictions Precautions Precautions: Shoulder Shoulder Interventions: Shoulder sling/immobilizer;Off for dressing/bathing/exercises Precaution Comments: pend OK Restrictions Weight Bearing Restrictions: Yes RUE Weight Bearing: Non weight bearing      Mobility Bed Mobility Overal bed mobility: Independent                Transfers Overall transfer level: Independent                          Pertinent Vitals/Pain Pain Assessment: 0-10 Pain Score: 2  Pain Descriptors / Indicators: Sore Pain Intervention(s): Limited activity within patient's tolerance;Repositioned     Hand Dominance     Extremity/Trunk Assessment Upper Extremity Assessment Upper Extremity Assessment: LUE deficits/detail LUE Deficits / Details: s/p sx           Communication Communication Communication: No difficulties   Cognition Arousal/Alertness: Awake/alert Behavior During Therapy: WFL for tasks assessed/performed Overall Cognitive Status: Within Functional Limits for tasks assessed                                        Exercises Shoulder Exercises Pendulum Exercise: AAROM;10 reps;Left;Standing   Shoulder Instructions Shoulder Instructions Donning/doffing shirt without moving shoulder: Supervision/safety;Caregiver independent with task Method for sponge bathing under operated UE: Supervision/safety;Caregiver independent with task Donning/doffing sling/immobilizer: Supervision/safety;Caregiver independent with task Correct positioning of  sling/immobilizer: Supervision/safety;Caregiver independent with task Pendulum exercises (written home exercise program): Supervision/safety;Caregiver independent with task ROM for elbow, wrist and digits of operated UE: Supervision/safety;Caregiver independent with task Sling wearing schedule (on at all times/off for ADL's): Supervision/safety;Caregiver independent with task Proper positioning of operated UE when showering: Supervision/safety;Caregiver independent with task Positioning of UE while sleeping: Supervision/safety;Caregiver independent with task    Home Living Family/patient expects to be discharged to:: Private residence Living Arrangements: Spouse/significant other Available Help at Discharge: Family Type of Home: House Home Access: Stairs to enter     Home Layout: One level     Bathroom Shower/Tub: Producer, television/film/video: Standard     Home Equipment: Environmental consultant - 2 wheels          Prior Functioning/Environment Level of Independence: Independent                          OT Goals(Current goals can be found in the care plan section) Acute Rehab OT Goals Patient Stated Goal: home today OT Goal Formulation: With patient  OT Frequency:      AM-PAC PT "6 Clicks" Daily Activity     Outcome Measure Help from another person eating meals?: None Help from another person taking care of personal grooming?: A Little Help from another person toileting, which includes using toliet, bedpan, or urinal?: A Little Help from another person bathing (including washing, rinsing, drying)?: A Little Help from another person to put on and taking off regular upper body clothing?: A Little Help from another person to put on and taking off regular lower body  clothing?: A Little 6 Click Score: 19   End of Session Nurse Communication: Precautions  Activity Tolerance: Patient tolerated treatment well Patient left: in chair;with call bell/phone within reach;with  nursing/sitter in room;with family/visitor present                   Time: 1610-9604 OT Time Calculation (min): 28 min Charges:  OT General Charges $OT Visit: 1 Visit OT Evaluation $OT Eval Moderate Complexity: 1 Mod OT Treatments $Self Care/Home Management : 8-22 mins G-Codes:     Lise Auer, OT [phone]  Einar Crow D 11/18/2017, 11:16 AM

## 2017-11-18 NOTE — Discharge Summary (Signed)
Patient ID: Connie [name] MRN: [id number] DOB/AGE: 03-02-1941 77 y.o.  Admit date: 11/17/2017 Discharge date: 11/18/2017  Admission Diagnoses:  Active Problems:   H/O total shoulder replacement, left   Discharge Diagnoses:  Same  Past Medical History:  Diagnosis Date  . Arthritis   . Depression   . GERD (gastroesophageal reflux disease)   . 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 History of hiatal hernia   . Hypertension   . Hypothyroidism   . Macular degeneration   . Psoriatic arthritis (HCC)   . Sinus drainage   . UTI (urinary tract infection)     Surgeries: Procedure(s): LEFT TOTAL SHOULDER ARTHROPLASTY on 11/17/2017   Consultants:   Discharged Condition: Improved  Hospital Course: Connie [name] is an 77 y.o. female who was admitted 11/17/2017 for operative treatment of<principal problem not specified>. Patient has severe unremitting pain that affects sleep, daily activities, and work/hobbies. After pre-op clearance the patient was taken to the operating room on 11/17/2017 and underwent  Procedure(s): LEFT TOTAL SHOULDER ARTHROPLASTY.    Patient was given perioperative antibiotics:  Anti-infectives (From admission, onward)   Start     Dose/Rate Route Frequency Ordered Stop   11/17/17 1600  clindamycin (CLEOCIN) IVPB 600 mg     600 mg 100 mL/hr over 30 Minutes Intravenous Every 6 hours 11/17/17 1552 11/18/17 0515   11/17/17 1400  ciprofloxacin (CIPRO) IVPB 400 mg     400 mg 200 mL/hr over 60 Minutes Intravenous  Once 11/17/17 1348 11/17/17 1449   11/17/17 1341  ciprofloxacin (CIPRO) 400 MG/200ML IVPB    Note to Pharmacy:  Ramond Craver   : cabinet override      11/17/17 1341 11/17/17 1537   11/17/17 0736  clindamycin (CLEOCIN) IVPB 900 mg     900 mg 100 mL/hr over 30 Minutes Intravenous On call to O.R. 11/17/17 0736 11/17/17 1107       Patient was given sequential compression devices, early ambulation, and chemoprophylaxis to prevent  DVT.  Patient benefited maximally from hospital stay and there were no complications.    Recent vital signs:  Patient Vitals for the past 24 hrs:  BP Temp Temp src Pulse Resp SpO2  11/18/17 0456 128/61 (!) 97.5 F (36.4 C) Oral 75 16 100 %  11/18/17 0118 (!) 113/57 97.8 F (36.6 C) Oral 68 16 99 %  11/17/17 2103 (!) 108/59 97.6 F (36.4 C) Oral 76 16 95 %  11/17/17 1845 116/74 98.7 F (37.1 C) Oral 79 16 99 %  11/17/17 1745 118/72 98.6 F (37 C) Oral 89 15 98 %  11/17/17 1645 114/75 98.4 F (36.9 C) Oral 84 15 97 %  11/17/17 1546 135/69 98.2 F (36.8 C) Oral 88 14 98 %  11/17/17 1530 (!) 123/96 - - 84 14 98 %  11/17/17 1529 - - - 79 13 98 %  11/17/17 1515 122/65 - - 82 14 99 %  11/17/17 1500 123/68 - - 86 12 99 %  11/17/17 1445 136/66 - - 90 11 100 %  11/17/17 1440 125/77 99.1 F (37.3 C) - 90 16 100 %  11/17/17 1015 124/68 - - 67 (!) 7 100 %  11/17/17 1000 139/71 - - 66 12 100 %  11/17/17 0955 126/83 - - 67 12 98 %  11/17/17 0950 126/73 - - 69 12 100 %  11/17/17 0945 136/67 - - 68 10 100 %  11/17/17 0940 134/65 - - 69 (!) 9 100 %  11/17/17 0935 138/73 - - 70 10 100 %  11/17/17 0930 134/77 - - 66 12 99 %  11/17/17 0928 - - - 73 14 99 %  11/17/17 0927 - - - 65 14 99 %  11/17/17 0926 - - - 66 13 98 %  11/17/17 0925 123/63 - - 67 12 98 %  11/17/17 0924 - - - 65 11 98 %  11/17/17 0923 - - - 64 (!) 9 98 %  11/17/17 0922 - - - 70 (!) 7 97 %  11/17/17 0921 (!) 135/97 - - 66 13 98 %  11/17/17 0920 - - - 72 14 99 %  11/17/17 0919 - - - 71 12 99 %  11/17/17 0918 - - - 70 14 100 %  11/17/17 0917 - - - 70 12 99 %  11/17/17 0916 - - - 78 16 100 %  11/17/17 0915 (!) 162/76 - - 74 20 99 %  11/17/17 0914 - - - 67 15 98 %  11/17/17 0913 (!) 149/78 - - - 11 -     Recent laboratory studies: No results for input(s): WBC, HGB, HCT, PLT, NA, K, CL, CO2, BUN, CREATININE, GLUCOSE, INR, CALCIUM in the last 72 hours.  Invalid input(s): PT, 2   Discharge Medications:   Allergies as  of 11/18/2017      Reactions   Penicillins Rash, Other (See Comments)   Has patient had a PCN reaction causing immediate rash, facial/tongue/throat swelling, SOB or lightheadedness with hypotension: Yes Has patient had a PCN reaction causing severe rash involving mucus membranes or skin necrosis: No Has patient had a PCN reaction that required hospitalization: No Has patient had a PCN reaction occurring within the last 10 years: No If all of the above answers are "NO", then may proceed with Cephalosporin use.   Sulfa Antibiotics Rash      Medication List    STOP taking these medications   methocarbamol 500 MG tablet Commonly known as:  ROBAXIN   naproxen sodium 220 MG tablet Commonly known as:  ALEVE   oxyCODONE-acetaminophen 5-325 MG tablet Commonly known as:  PERCOCET/ROXICET     TAKE these medications   aspirin EC 325 MG tablet Take 1 tablet (325 mg total) by mouth 2 (two) times daily. What changed:  when to take this   conjugated estrogens vaginal cream Commonly known as:  PREMARIN Place 1 Applicatorful vaginally 2 (two) times a week.   hydrochlorothiazide 25 MG tablet Commonly known as:  HYDRODIURIL Take 25 mg by mouth daily.   HYDROcodone-acetaminophen 5-325 MG tablet Commonly known as:  NORCO Take 1 tablet by mouth every 6 (six) hours as needed.   irbesartan 150 MG tablet Commonly known as:  AVAPRO Take 150 mg by mouth daily.   levothyroxine 75 MCG tablet Commonly known as:  SYNTHROID, LEVOTHROID Take 75 mcg by mouth daily before breakfast.   lovastatin 40 MG tablet Commonly known as:  MEVACOR Take 80 mg by mouth daily.   PRESERVISION AREDS 2 PO Take 1 capsule by mouth 2 (two) times daily.   sertraline 100 MG tablet Commonly known as:  ZOLOFT Take 100 mg by mouth daily.   THERATEARS OP Place 1 drop into both eyes 2 (two) times daily as needed (for dry eyes).   tiZANidine 2 MG tablet Commonly known as:  ZANAFLEX Take 1 tablet (2 mg total) by mouth  every 6 (six) hours as needed.       Diagnostic Studies: Dg Chest 2  View  Result Date: 11/14/2017 CLINICAL DATA:  Preop for left shoulder arthroplasty EXAM: CHEST - 2 VIEW COMPARISON:  Chest x-ray of 08/25/2014 FINDINGS: No active infiltrate or effusion is seen. Mediastinal and hilar contours are unremarkable. The heart is within normal limits in size. No acute bony abnormality is seen. There is mild curvature of the lower thoracic-upper lumbar spine convex to right. IMPRESSION: No active cardiopulmonary disease. Electronically Signed   By: Dwyane Dee M.D.   On: 11/14/2017 16:31   Dg Shoulder 1v Left  Result Date: 11/17/2017 CLINICAL DATA:  Postop shoulder replacement. EXAM: LEFT SHOULDER - 1 VIEW COMPARISON:  None. FINDINGS: A single AP radiograph of the left shoulder is provided. Left shoulder arthroplasty has been performed. The femoral scratched of the humeral prosthesis is approximated with the glenoid on this single projection. Postoperative gas is noted in the adjacent soft tissues. No acute fracture is identified. IMPRESSION: Status post left shoulder arthroplasty without evidence of acute complication. Electronically Signed   By: Sebastian Ache M.D.   On: 11/17/2017 15:09    Disposition: Discharge disposition: 01-Home or Self Care       Discharge Instructions    Call MD / Call 911   Complete by:  As directed    If you experience chest pain or shortness of breath, CALL 911 and be transported to the hospital emergency room.  If you develope a fever above 101 F, pus (white drainage) or increased drainage or redness at the wound, or calf pain, call your surgeon's office.   Constipation Prevention   Complete by:  As directed    Drink plenty of fluids.  Prune juice may be helpful.  You may use a stool softener, such as Colace (over the counter) 100 mg twice a day.  Use MiraLax (over the counter) for constipation as needed.   Diet - low sodium heart healthy   Complete by:  As directed     Driving restrictions   Complete by:  As directed    No driving for 2 weeks   Increase activity slowly as tolerated   Complete by:  As directed    Patient may shower   Complete by:  As directed    You may shower without a dressing once there is no drainage.  Do not wash over the wound.  If drainage remains, cover wound with plastic wrap and then shower.      Follow-up Information    Jodi Geralds, MD Follow up in 10 day(s).   Specialty:  Orthopedic Surgery Contact information: [address] [place] [address] [phone]            Signed: Dannielle Burn 11/18/2017, 8:04 AM

## 2017-11-18 NOTE — Progress Notes (Signed)
PATIENT ID: Connie [name]  MRN: [id number]  DOB/AGE:  Jul 28, 1940 / 77 y.o.  1 Day Post-Op Procedure(s) (LRB): LEFT TOTAL SHOULDER ARTHROPLASTY (Left)    PROGRESS NOTE Subjective:   Patient is alert, oriented, no Nausea, no Vomiting, yes passing gas, no Bowel Movement. Taking PO well. Denies SOB, Chest or Calf Pain. Using Incentive Spirometer, PAS in place.  Patient reports pain as mild    Objective: Vital signs in last 24 hours: Temp:  [97.5 F (36.4 C)-99.1 F (37.3 C)] 97.5 F (36.4 C) (04/27 0456) Pulse Rate:  [64-90] 75 (04/27 0456) Resp:  [7-20] 16 (04/27 0456) BP: (108-162)/(57-97) 128/61 (04/27 0456) SpO2:  [95 %-100 %] 100 % (04/27 0456) Weight:  [80.5 kg (177 lb 6 oz)] 80.5 kg (177 lb 6 oz) (04/26 0803)    Intake/Output from previous day: I/O last 3 completed shifts: In: 4155 [P.O.:180; I.V.:3300; IV Piggyback:675] Out: 400 [Urine:300; Blood:100]   Intake/Output this shift: No intake/output data recorded.   LABORATORY DATA: No results for input(s): WBC, HGB, HCT, PLT, NA, K, CL, CO2, BUN, CREATININE, GLUCOSE, GLUCAP, INR, CALCIUM in the last 72 hours.  Invalid input(s): PT, 2  Examination: Neurologically intact Neurovascular intact Sensation intact distally Intact pulses distally Incision: dressing C/D/I}  Assessment:   1 Day Post-Op Procedure(s) (LRB): LEFT TOTAL SHOULDER ARTHROPLASTY (Left)   Plan:  Non Weight Bearing (NWB) with left upper extremity  DVT Prophylaxis:  Aspirin  DISCHARGE PLAN: Home, later today after OT sees patient       Dannielle Burn 11/18/2017, 7:52 AM

## 2017-11-18 NOTE — Progress Notes (Signed)
Pt to d/c home. AVS reviewed and "My Chart" discussed with pt. Pt capable of verbalizing medications, signs and symptoms of infection, and follow-up appointments. Remains hemodynamically stable. No signs and symptoms of distress. Educated pt to return to ER in the case of SOB, dizziness, or chest pain.  

## 2017-11-18 NOTE — Discharge Instructions (Signed)
INSTRUCTIONS AFTER JOINT REPLACEMENT   o Remove items at home which could result in a fall. This includes throw rugs or furniture in walking pathways o ICE to the affected joint every three hours while awake for 30 minutes at a time, for at least the first 3-5 days, and then as needed for pain and swelling.  Continue to use ice for pain and swelling.   o Continue to use the breathing machine you got in the hospital (incentive spirometer) which will help keep your temperature down.  It is common for your temperature to cycle up and down following surgery, especially at night when you are not up moving around and exerting yourself.  The breathing machine keeps your lungs expanded and your temperature down.   DIET:  As you were doing prior to hospitalization, we recommend a well-balanced diet.  DRESSING / WOUND CARE / SHOWERING  Keep the surgical dressing until follow up.  The dressing is water proof, so you can shower without any extra covering.  IF THE DRESSING FALLS OFF or the wound gets wet inside, change the dressing with sterile gauze.  Please use good hand washing techniques before changing the dressing.  Do not use any lotions or creams on the incision until instructed by your surgeon.    ACTIVITY  o Increase activity slowly as tolerated, but follow the weight bearing instructions below.   o No driving for 6 weeks or until further direction given by your physician.  You cannot drive while taking narcotics.  o No lifting or carrying greater than 10 lbs. until further directed by your surgeon. o Avoid periods of inactivity such as sitting longer than an hour when not asleep. This helps prevent blood clots.  o You may return to work once you are authorized by your doctor.     WEIGHT BEARING   Other:  pateint only to do pendulum exercises   EXERCISES  Results after joint replacement surgery are often greatly improved when you follow the exercise, range of motion and muscle  strengthening exercises prescribed by your doctor. Safety measures are also important to protect the joint from further injury. Any time any of these exercises cause you to have increased pain or swelling, decrease what you are doing until you are comfortable again and then slowly increase them. If you have problems or questions, call your caregiver or physical therapist for advice.   A rehabilitation program following joint replacement surgery can speed recovery and prevent re-injury in the future due to weakened muscles. Contact your doctor or a physical therapist for more information on knee rehabilitation.    CONSTIPATION  Constipation is defined medically as fewer than three stools per week and severe constipation as less than one stool per week.  Even if you have a regular bowel pattern at home, your normal regimen is likely to be disrupted due to multiple reasons following surgery.  Combination of anesthesia, postoperative narcotics, change in appetite and fluid intake all can affect your bowels.   YOU MUST use at least one of the following options; they are listed in order of increasing strength to get the job done.  They are all available over the counter, and you may need to use some, POSSIBLY even all of these options:    Drink plenty of fluids (prune juice may be helpful) and high fiber foods Colace 100 mg by mouth twice a day  Senokot for constipation as directed and as needed Dulcolax (bisacodyl), take with full glass of  water  Miralax (polyethylene glycol) once or twice a day as needed.  If you have tried all these things and are unable to have a bowel movement in the first 3-4 days after surgery call either your surgeon or your primary doctor.    If you experience loose stools or diarrhea, hold the medications until you stool forms back up.  If your symptoms do not get better within 1 week or if they get worse, check with your doctor.  If you experience "the worst abdominal pain  ever" or develop nausea or vomiting, please contact the office immediately for further recommendations for treatment.   ITCHING:  If you experience itching with your medications, try taking only a single pain pill, or even half a pain pill at a time.  You can also use Benadryl over the counter for itching or also to help with sleep.   MEDICATIONS:  See your medication summary on the After Visit Summary that nursing will review with you.  You may have some home medications which will be placed on hold until you complete the course of blood thinner medication.  It is important for you to complete the blood thinner medication as prescribed.  PRECAUTIONS:  If you experience chest pain or shortness of breath - call 911 immediately for transfer to the hospital emergency department.   If you develop a fever greater that 101 F, purulent drainage from wound, increased redness or drainage from wound, foul odor from the wound/dressing, or calf pain - CONTACT YOUR SURGEON.                                                   FOLLOW-UP APPOINTMENTS:  If you do not already have a post-op appointment, please call the office for an appointment to be seen by your surgeon.  Guidelines for how soon to be seen are listed in your After Visit Summary, but are typically between 1-4 weeks after surgery.  OTHER INSTRUCTIONS:   Knee Replacement:  Do not place pillow under knee, focus on keeping the knee straight while resting. CPM instructions: 0-90 degrees, 2 hours in the morning, 2 hours in the afternoon, and 2 hours in the evening. Place foam block, curve side up under heel at all times except when in CPM or when walking.  DO NOT modify, tear, cut, or change the foam block in any way.  MAKE SURE YOU:   Understand these instructions.   Get help right away if you are not doing well or get worse.    Thank you for letting us be a part of your medical care team.  It is a privilege we respect greatly.  We hope these  instructions will help you stay on track for a fast and full recovery!

## 2017-11-20 ENCOUNTER — Encounter (HOSPITAL_COMMUNITY): Payer: Self-pay | Admitting: Orthopedic Surgery

## 2017-11-20 NOTE — Op Note (Signed)
NAME:  Connie [name], Connie [name]                     ACCOUNT NO.:  MEDICAL RECORD NO.:  [id number]  LOCATION:                                 FACILITY:  PHYSICIAN:  Harvie Junior, M.D.        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  PROCEDURES: 1. Left total shoulder replacement with a size 10 stem with a 48-mm     eccentric head ball and a 44-mm press-fit glenoid with the cement     and the 3 peg holes. 2. Biceps tenodesis, subpec. 3. Rotator cuff repair.  SURGEON:  Harvie Junior, M.D.  ASSISTANT:  RNFA.  ANESTHESIA:  General.  BRIEF HISTORY:  The patient is a 77 year old female with a long history of significant complaints of left shoulder pain.  She had x-ray showing severe bone-on-bone arthritis and after failure of conservative care, she was taken to the operating room for left total shoulder replacement.  DESCRIPTION OF PROCEDURE:  The patient was brought to the operative room and after adequate level of anesthesia was obtained with general anesthetic, the patient was placed supine on the operating table.  She then moved to the beach-chair position on the Downs bed and all bony prominences were well padded.  Attention was then turned to the left shoulder where after routine prep and drape, an incision was made for an anterior approach for the shoulder, subcutaneous tissue down the level of the deltopectoral interval.  The cephalic vein was identified and retracted laterally with the deltoid.  Adhesions were released underneath the conjoined tendon.  The axillary nerve was identified and the retractor was put in place.  Clavipectoral fascia was divided, and at this point, attention  was turned towards the biceps tendon.  The biceps interval was exploited and the biceps tendon was released.  The biceps was then, with the arm stitch in a straight position, tenodesed to the upper border of the pectoralis, which had been released for about a 0.25-inch.  At this time, attention was turned towards back to the shoulder where an osteotomy was performed of the subscapularis and the subscapularis was then dissected back to the glenoid.  Care being taken to make sure that there was a dynamic glenoid.  Once this was done, attention was turned to the stem side, it was opened and then sequentially rasped up to a level of 10 mm, and then an 8-mm trial followed by a 10-mm trial were placed and hammered flat to the cut surface.  At this point, attention was turned towards the glenoid where releases were done and the labrectomy was performed.  The biceps tendon was completely taken down.  The right side was really between a 42 and a 44, but I felt like I wanted to get good coverage.  The central PEG was reamed and then drilled and then the holes were drilled for the remainder of the glenoid.  Once  this was done, the trial glenoid was put in place and got nice fit and bottoming out.  At this point, the area was irrigated and dried, and the final was then cemented into place with the central anchor peg with bone placed all around the anchor peg followed by the small pegs going into the cemented holes.  Once that was done, this was hammered into place, allowed for the cement to harden, and then, we turned back to the stem side, we put the 10 in, drilled three holes through the bicipital groove and then, we passed three sutures around the stem and then did Mason-Allen stitches through the subscap and then brought the subscap over into a reduced position and then tied the stitches giving a nice anatomic reduction of the subscap with the piece of bone from the osteotomy.  Prior to doing all  of this, the 44 eccentric head was trialed in the posterior-superior position eccentrically, excellent range of motion, stability was achieved of the shoulder.  Once this ball was put in place, the range of motion was excellent, external rotation was 30 degrees, which matched our preoperative motion; however, the elevation was full.  At this point, the wound was irrigated and suctioned dry.  Deep layer closed with 0 Vicryl, skin with 2-0 Vicryl, and 3-0 Monocryl subcuticular.  Benzoin and Steri-Strips were applied followed by an Aquacel dressing and the patient was placed into a shoulder immobilizer and taken to the recovery room where she was noted to be in satisfactory condition.  Estimated blood loss for procedure was approximately 100 cc.     Harvie Junior, M.D.     Ranae Plumber  D:  11/17/2017  T:  11/18/2017  Job:  [id number]  cc:   Harvie Junior, M.D. Fax: [phone]

## 2017-11-21 ENCOUNTER — Encounter (HOSPITAL_COMMUNITY): Payer: Self-pay | Admitting: Orthopedic Surgery

## 2018-05-01 ENCOUNTER — Other Ambulatory Visit: Payer: Self-pay | Admitting: Family Medicine

## 2018-05-01 DIAGNOSIS — Z1231 Encounter for screening mammogram for malignant neoplasm of breast: Secondary | ICD-10-CM

## 2018-06-08 ENCOUNTER — Ambulatory Visit
Admission: RE | Admit: 2018-06-08 | Discharge: 2018-06-08 | Disposition: A | Discharge status: Home / Self Care | Payer: Medicare Other | Source: Ambulatory Visit | Attending: Family Medicine | Admitting: Family Medicine

## 2018-06-08 DIAGNOSIS — Z1231 Encounter for screening mammogram for malignant neoplasm of breast: Secondary | ICD-10-CM

## 2019-07-15 ENCOUNTER — Other Ambulatory Visit: Payer: Self-pay | Admitting: Family Medicine

## 2019-07-15 DIAGNOSIS — Z1231 Encounter for screening mammogram for malignant neoplasm of breast: Secondary | ICD-10-CM

## 2019-07-16 ENCOUNTER — Other Ambulatory Visit: Payer: Self-pay

## 2019-07-16 ENCOUNTER — Ambulatory Visit
Admission: RE | Admit: 2019-07-16 | Discharge: 2019-07-16 | Disposition: A | Discharge status: Home / Self Care | Payer: Medicare Other | Source: Ambulatory Visit | Attending: Family Medicine | Admitting: Family Medicine

## 2019-07-16 DIAGNOSIS — Z1231 Encounter for screening mammogram for malignant neoplasm of breast: Secondary | ICD-10-CM

## 2019-08-06 ENCOUNTER — Other Ambulatory Visit: Payer: Self-pay | Admitting: Family Medicine

## 2019-08-06 DIAGNOSIS — E2839 Other primary ovarian failure: Secondary | ICD-10-CM

## 2019-08-16 ENCOUNTER — Ambulatory Visit: Payer: Medicare Other

## 2019-08-16 ENCOUNTER — Ambulatory Visit: Payer: Medicare PPO | Attending: Internal Medicine

## 2019-08-16 DIAGNOSIS — Z23 Encounter for immunization: Secondary | ICD-10-CM | POA: Insufficient documentation

## 2019-09-06 ENCOUNTER — Ambulatory Visit: Payer: Medicare PPO | Attending: Internal Medicine

## 2019-09-06 DIAGNOSIS — Z23 Encounter for immunization: Secondary | ICD-10-CM

## 2019-09-06 NOTE — Progress Notes (Signed)
   Covid-19 Vaccination Clinic  Name:  Demaris Bousquet    MRN: [id number] DOB: 1941-05-07  09/06/2019  Ms. Altman was observed post Covid-19 immunization for 15 minutes without incidence. She was provided with Vaccine Information Sheet and instruction to access the V-Safe system.   Ms. Askari was instructed to call 911 with any severe reactions post vaccine: Marland Kitchen Difficulty breathing  . Swelling of your face and throat  . A fast heartbeat  . A bad rash all over your body  . Dizziness and weakness    Immunizations Administered    Name Date Dose VIS Date Route   Pfizer COVID-19 Vaccine 09/06/2019 10:38 AM 0.3 mL 07/05/2019 Intramuscular   Manufacturer: [employer], [employer]   Lot: [id number]   NDC: [address]-8

## 2019-09-24 DIAGNOSIS — I10 Essential (primary) hypertension: Secondary | ICD-10-CM | POA: Diagnosis not present

## 2019-09-24 DIAGNOSIS — E039 Hypothyroidism, unspecified: Secondary | ICD-10-CM | POA: Diagnosis not present

## 2019-10-22 ENCOUNTER — Other Ambulatory Visit: Payer: Self-pay

## 2019-10-22 ENCOUNTER — Ambulatory Visit
Admission: RE | Admit: 2019-10-22 | Discharge: 2019-10-22 | Disposition: A | Discharge status: Home / Self Care | Payer: Medicare PPO | Source: Ambulatory Visit | Attending: Family Medicine | Admitting: Family Medicine

## 2019-10-22 DIAGNOSIS — Z780 Asymptomatic menopausal state: Secondary | ICD-10-CM | POA: Diagnosis not present

## 2019-10-22 DIAGNOSIS — E2839 Other primary ovarian failure: Secondary | ICD-10-CM

## 2019-10-22 DIAGNOSIS — M85851 Other specified disorders of bone density and structure, right thigh: Secondary | ICD-10-CM | POA: Diagnosis not present

## 2019-11-21 DIAGNOSIS — H353232 Exudative age-related macular degeneration, bilateral, with inactive choroidal neovascularization: Secondary | ICD-10-CM | POA: Diagnosis not present

## 2019-11-21 DIAGNOSIS — H35423 Microcystoid degeneration of retina, bilateral: Secondary | ICD-10-CM | POA: Diagnosis not present

## 2019-11-21 DIAGNOSIS — H35433 Paving stone degeneration of retina, bilateral: Secondary | ICD-10-CM | POA: Diagnosis not present

## 2019-11-21 DIAGNOSIS — H43813 Vitreous degeneration, bilateral: Secondary | ICD-10-CM | POA: Diagnosis not present

## 2020-01-28 DIAGNOSIS — I10 Essential (primary) hypertension: Secondary | ICD-10-CM | POA: Diagnosis not present

## 2020-01-28 DIAGNOSIS — F322 Major depressive disorder, single episode, severe without psychotic features: Secondary | ICD-10-CM | POA: Diagnosis not present

## 2020-01-28 DIAGNOSIS — E7800 Pure hypercholesterolemia, unspecified: Secondary | ICD-10-CM | POA: Diagnosis not present

## 2020-01-28 DIAGNOSIS — E039 Hypothyroidism, unspecified: Secondary | ICD-10-CM | POA: Diagnosis not present

## 2020-03-06 DIAGNOSIS — R8290 Unspecified abnormal findings in urine: Secondary | ICD-10-CM | POA: Diagnosis not present

## 2020-03-06 DIAGNOSIS — I10 Essential (primary) hypertension: Secondary | ICD-10-CM | POA: Diagnosis not present

## 2020-03-06 DIAGNOSIS — K219 Gastro-esophageal reflux disease without esophagitis: Secondary | ICD-10-CM | POA: Diagnosis not present

## 2020-03-06 DIAGNOSIS — E039 Hypothyroidism, unspecified: Secondary | ICD-10-CM | POA: Diagnosis not present

## 2020-03-06 DIAGNOSIS — F322 Major depressive disorder, single episode, severe without psychotic features: Secondary | ICD-10-CM | POA: Diagnosis not present

## 2020-06-01 DIAGNOSIS — H43813 Vitreous degeneration, bilateral: Secondary | ICD-10-CM | POA: Diagnosis not present

## 2020-06-01 DIAGNOSIS — H35423 Microcystoid degeneration of retina, bilateral: Secondary | ICD-10-CM | POA: Diagnosis not present

## 2020-06-01 DIAGNOSIS — H35433 Paving stone degeneration of retina, bilateral: Secondary | ICD-10-CM | POA: Diagnosis not present

## 2020-06-01 DIAGNOSIS — H353232 Exudative age-related macular degeneration, bilateral, with inactive choroidal neovascularization: Secondary | ICD-10-CM | POA: Diagnosis not present

## 2020-07-02 DIAGNOSIS — H353133 Nonexudative age-related macular degeneration, bilateral, advanced atrophic without subfoveal involvement: Secondary | ICD-10-CM | POA: Diagnosis not present

## 2020-07-02 DIAGNOSIS — H52203 Unspecified astigmatism, bilateral: Secondary | ICD-10-CM | POA: Diagnosis not present

## 2020-07-02 DIAGNOSIS — Z961 Presence of intraocular lens: Secondary | ICD-10-CM | POA: Diagnosis not present

## 2020-07-02 DIAGNOSIS — H524 Presbyopia: Secondary | ICD-10-CM | POA: Diagnosis not present

## 2020-08-03 DIAGNOSIS — I10 Essential (primary) hypertension: Secondary | ICD-10-CM | POA: Diagnosis not present

## 2020-08-03 DIAGNOSIS — Z0000 Encounter for general adult medical examination without abnormal findings: Secondary | ICD-10-CM | POA: Diagnosis not present

## 2020-08-03 DIAGNOSIS — E7800 Pure hypercholesterolemia, unspecified: Secondary | ICD-10-CM | POA: Diagnosis not present

## 2020-08-03 DIAGNOSIS — L4050 Arthropathic psoriasis, unspecified: Secondary | ICD-10-CM | POA: Diagnosis not present

## 2020-08-03 DIAGNOSIS — E039 Hypothyroidism, unspecified: Secondary | ICD-10-CM | POA: Diagnosis not present

## 2020-08-03 DIAGNOSIS — K219 Gastro-esophageal reflux disease without esophagitis: Secondary | ICD-10-CM | POA: Diagnosis not present

## 2020-08-03 DIAGNOSIS — F322 Major depressive disorder, single episode, severe without psychotic features: Secondary | ICD-10-CM | POA: Diagnosis not present

## 2020-12-03 DIAGNOSIS — H35433 Paving stone degeneration of retina, bilateral: Secondary | ICD-10-CM | POA: Diagnosis not present

## 2020-12-03 DIAGNOSIS — H3540 Unspecified peripheral retinal degeneration: Secondary | ICD-10-CM | POA: Diagnosis not present

## 2020-12-03 DIAGNOSIS — H353232 Exudative age-related macular degeneration, bilateral, with inactive choroidal neovascularization: Secondary | ICD-10-CM | POA: Diagnosis not present

## 2020-12-03 DIAGNOSIS — H35423 Microcystoid degeneration of retina, bilateral: Secondary | ICD-10-CM | POA: Diagnosis not present

## 2020-12-15 ENCOUNTER — Other Ambulatory Visit (HOSPITAL_BASED_OUTPATIENT_CLINIC_OR_DEPARTMENT_OTHER): Payer: Self-pay

## 2020-12-15 ENCOUNTER — Ambulatory Visit: Payer: Medicare PPO | Attending: Internal Medicine

## 2020-12-15 DIAGNOSIS — Z23 Encounter for immunization: Secondary | ICD-10-CM

## 2020-12-15 MED ORDER — PFIZER-BIONT COVID-19 VAC-TRIS 30 MCG/0.3ML IM SUSP
INTRAMUSCULAR | 0 refills | Status: DC
  Filled 2020-12-15: qty 0.3, 1d supply, fill #0

## 2020-12-15 NOTE — Progress Notes (Signed)
   Covid-19 Vaccination Clinic  Name:  Lianette Broussard    MRN: [id number] DOB: 17-Feb-1941  12/15/2020  Ms. Raben was observed post Covid-19 immunization for 15 minutes without incident. She was provided with Vaccine Information Sheet and instruction to access the V-Safe system.   Ms. Pucciarelli was instructed to call 911 with any severe reactions post vaccine: Marland Kitchen Difficulty breathing  . Swelling of face and throat  . A fast heartbeat  . A bad rash all over body  . Dizziness and weakness   Immunizations Administered    Name Date Dose VIS Date Route   PFIZER Comrnaty(Gray TOP) Covid-19 Vaccine 12/15/2020 12:55 PM 0.3 mL 07/02/2020 Intramuscular   Manufacturer: [employer], [employer]   Lot: [id number]   NDC: [phone]

## 2021-02-10 DIAGNOSIS — I10 Essential (primary) hypertension: Secondary | ICD-10-CM | POA: Diagnosis not present

## 2021-02-10 DIAGNOSIS — M79672 Pain in left foot: Secondary | ICD-10-CM | POA: Diagnosis not present

## 2021-02-10 DIAGNOSIS — H6123 Impacted cerumen, bilateral: Secondary | ICD-10-CM | POA: Diagnosis not present

## 2021-02-10 DIAGNOSIS — R002 Palpitations: Secondary | ICD-10-CM | POA: Diagnosis not present

## 2021-02-10 DIAGNOSIS — E039 Hypothyroidism, unspecified: Secondary | ICD-10-CM | POA: Diagnosis not present

## 2021-02-10 DIAGNOSIS — M79671 Pain in right foot: Secondary | ICD-10-CM | POA: Diagnosis not present

## 2021-02-10 DIAGNOSIS — E7800 Pure hypercholesterolemia, unspecified: Secondary | ICD-10-CM | POA: Diagnosis not present

## 2021-02-10 DIAGNOSIS — F322 Major depressive disorder, single episode, severe without psychotic features: Secondary | ICD-10-CM | POA: Diagnosis not present

## 2021-02-12 ENCOUNTER — Ambulatory Visit: Payer: Medicare PPO | Admitting: Cardiovascular Disease

## 2021-03-08 NOTE — Progress Notes (Signed)
Cardiology Office Note:    Date:  03/10/2021   ID:  Connie [name], Connie [name] 04/03/1941, MRN [id number]  PCP:  Shon Hale, MD  Cardiologist:  None  Electrophysiologist:  None   Referring MD: Shon Hale, *   Chief Complaint  Patient presents with   Palpitations          History of Present Illness:    Connie [name] is a 80 y.o. female with a hx of hypertension, hypothyroidism, hyperlipidemia, GERD, psoriatic arthritis who is referred by Dr Chanetta Marshall for evaluation of dyspnea and palpitations.  She reports has been having palpitations for the last 3 months.  States that palpitations occur at rest, feels like fluttering in her chest.  Has been occurring daily, last for few minutes.  Denies any lightheadedness or syncope.  Reports feels short of breath during palpitations.  Also has dyspnea when she walks fast.  She denies any chest pain.  Denies any lower extremity edema.  She does not exercise.  No smoking history.  Family history includes father died of MI at age 70.    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 Luiz Blare   TONSILLECTOMY     as a child   TOTAL KNEE ARTHROPLASTY Right 09/05/2014   Procedure: TOTAL KNEE ARTHROPLASTY;  Surgeon: Harvie Junior, MD;  Location: [facility] OR;  Service: Orthopedics;  Laterality: Right;   TOTAL SHOULDER ARTHROPLASTY Left 11/17/2017   Procedure: LEFT TOTAL SHOULDER ARTHROPLASTY;   Surgeon: Jodi Geralds, MD;  Location: [facility];  Service: Orthopedics;  Laterality: Left;  with block   TUBAL LIGATION  1972    Current Medications: Current Meds  Medication Sig   Carboxymethylcellulose Sodium (THERATEARS OP) Place 1 drop into both eyes 2 (two) times daily as needed (for dry eyes).   conjugated estrogens (PREMARIN) vaginal cream Place 1 Applicatorful vaginally 2 (two) times a week.   COVID-19 mRNA Vac-TriS, Pfizer, (PFIZER-BIONT COVID-19 VAC-TRIS) SUSP injection Inject into the muscle.   hydrochlorothiazide (HYDRODIURIL) 25 MG tablet Take 25 mg by mouth daily.   irbesartan (AVAPRO) 150 MG tablet Take 150 mg by mouth daily.   levothyroxine (SYNTHROID, LEVOTHROID) 75 MCG tablet Take 75 mcg by mouth daily before breakfast.   lovastatin (MEVACOR) 40 MG tablet Take 80 mg by mouth daily.    Multiple Vitamins-Minerals (PRESERVISION AREDS 2 PO) Take 1 capsule by mouth 2 (two) times daily.   naproxen sodium (ALEVE) 220 MG tablet Take 220 mg by mouth daily as needed.   sertraline (ZOLOFT) 100 MG tablet Take 100 mg by mouth daily.     Allergies:   Penicillins and Sulfa antibiotics   Social History   Socioeconomic History   Marital status: Married    Spouse name: Not on file   Number  of children: Not on file   Years of education: Not on file   Highest education level: Not on file  Occupational History   Not on file  Tobacco Use   Smoking status: Never   Smokeless tobacco: Never  Vaping Use   Vaping Use: Never used  Substance and Sexual Activity   Alcohol use: Yes    Comment: rarely wine   Drug use: No   Sexual activity: Not on file  Other Topics Concern   Not on file  Social History Narrative   Not on file   Social Determinants of Health   Financial Resource Strain: Not on file  Food Insecurity: Not on file  Transportation Needs: Not on file  Physical Activity: Not on file  Stress: Not on file  Social Connections: Not on file     Family History: The  patient's family history is negative for Breast cancer.  ROS:   Please see the history of present illness.     All other systems reviewed and are negative.  EKGs/Labs/Other Studies Reviewed:    The following studies were reviewed today:   EKG:  EKG is  ordered today.  The ekg ordered today demonstrates normal sinus rhythm, PACs, low voltage, incomplete right bundle branch block, Q waves in leads III, aVF, V3/4  Recent Labs: No results found for requested labs within last 8760 hours.  Recent Lipid Panel No results found for: CHOL, TRIG, HDL, CHOLHDL, VLDL, LDLCALC, LDLDIRECT  Physical Exam:    VS:  BP (!) 174/68 (BP Location: Left Arm, Patient Position: Sitting, Cuff Size: Large)   Pulse 82   Ht 5\' 1"  (1.549 m)   Wt 167 lb (75.8 kg)   SpO2 97%   BMI 31.55 kg/m     Wt Readings from Last 3 Encounters:  03/10/21 167 lb (75.8 kg)  11/17/17 177 lb 6 oz (80.5 kg)  11/14/17 177 lb 6 oz (80.5 kg)     GEN:  Well nourished, well developed in no acute distress HEENT: Normal NECK: No JVD; No carotid bruits LYMPHATICS: No lymphadenopathy CARDIAC: RRR, no murmurs, rubs, gallops RESPIRATORY:  Clear to auscultation without rales, wheezing or rhonchi  ABDOMEN: Soft, non-tender, non-distended MUSCULOSKELETAL:  No edema; No deformity  SKIN: Warm and dry NEUROLOGIC:  Alert and oriented x 3 PSYCHIATRIC:  Normal affect   ASSESSMENT:    1. Palpitations   2. Shortness of breath   3. Essential hypertension   4. Hyperlipidemia, unspecified hyperlipidemia type    PLAN:    Dyspnea: Suspect due to deconditioning.  However does have abnormal EKG with Q waves in inferior and anterior leads, will check echocardiogram to rule out structural heart disease  Palpitations: Description concerning for arrhythmia, will check Zio patch x7 days  Hypertension: On irbesartan 150 mg daily and hydrochlorothiazide 25 mg daily.  BP elevated in clinic today but reports it is usually under good control at  home.  Asked to check BP twice daily for next week and call with results.  Hyperlipidemia: On lovastatin 80 mg daily.  LDL 115 on 08/03/2020.  Will check calcium score to guide how aggressive to be in lowering cholesterol.  RTC in 6 months   Medication Adjustments/Labs and Tests Ordered: Current medicines are reviewed at length with the patient today.  Concerns regarding medicines are outlined above.  Orders Placed This Encounter  Procedures   CT CARDIAC SCORING (SELF PAY ONLY)   LONG TERM MONITOR (3-14 DAYS)   EKG 12-Lead   ECHOCARDIOGRAM  COMPLETE    No orders of the defined types were placed in this encounter.   Patient Instructions  Medication Instructions:  Your physician recommends that you continue on your current medications as directed. Please refer to the Current Medication list given to you today.  Testing/Procedures: Your physician has requested that you have an echocardiogram. Echocardiography is a painless test that uses sound waves to create images of your heart. It provides your doctor with information about the size and shape of your heart and how well your heart's chambers and valves are working. This procedure takes approximately one hour. There are no restrictions for this procedure. This will be done at our [facility] location:  [address]  ZIO XT- Long Term Monitor Instructions   Your physician has requested you wear a ZIO patch monitor for _7_ days.  This is a single patch monitor.   [employer] supplies one patch monitor per enrollment. Additional stickers are not available. Please do not apply patch if you will be having a Nuclear Stress Test, Echocardiogram, Cardiac CT, MRI, or Chest Xray during the period you would be wearing the monitor. The patch cannot be worn during these tests. You cannot remove and re-apply the ZIO XT patch monitor.  Your ZIO patch monitor will be sent Fed Ex from [employer] directly to your home address. It  may take 3-5 days to receive your monitor after you have been enrolled.  Once you have received your monitor, please review the enclosed instructions. Your monitor has already been registered assigning a specific monitor serial # to you.  Billing and Patient Assistance Program Information   We have supplied [employer] with any of your insurance information on file for billing purposes. [employer] offers a sliding scale Patient Assistance Program for patients that do not have insurance, or whose insurance does not completely cover the cost of the ZIO monitor.   You must apply for the Patient Assistance Program to qualify for this discounted rate.     To apply, please call [employer] at [phone], select option 4, then select option 2, and ask to apply for Patient Assistance Program.  Meredeth Ide will ask your household income, and how many people are in your household.  They will quote your out-of-pocket cost based on that information.  [employer] will also be able to set up a 38-month, interest-free payment plan if needed.  Applying the monitor   Shave hair from upper left chest.  Hold abrader disc by orange tab. Rub abrader in 40 strokes over the upper left chest as indicated in your monitor instructions.  Clean area with 4 enclosed alcohol pads. Let dry.  Apply patch as indicated in monitor instructions. Patch will be placed under collarbone on left side of chest with arrow pointing upward.  Rub patch adhesive wings for 2 minutes. Remove white label marked "1". Remove the white label marked "2". Rub patch adhesive wings for 2 additional minutes.  While looking in a mirror, press and release button in center of patch. A small green light will flash 3-4 times. This will be your only indicator that the monitor has been turned on. ?  Do not shower for the first 24 hours. You may shower after the first 24 hours.  Press the button if you feel a symptom. You will hear a small click. Record Date, Time and Symptom in  the Patient Logbook.  When you are ready to remove the patch, follow instructions on the last 2 pages  of the Patient Logbook. Stick patch monitor onto the last page of Patient Logbook.  Place Patient Logbook in the blue and white box.  Use locking tab on box and tape box closed securely.  The blue and white box has prepaid postage on it. Please place it in the mailbox as soon as possible. Your physician should have your test results approximately 7 days after the monitor has been mailed back to [facility].  Call [facility] Customer Care at [phone] if you have questions regarding your ZIO XT patch monitor. Call them immediately if you see an orange light blinking on your monitor.  If your monitor falls off in less than 4 days, contact our Monitor department at [phone]. ?If your monitor becomes loose or falls off after 4 days call [employer] at [phone] for suggestions on securing your monitor.?  CT coronary calcium score. This test is done at [address]. This is $99 out of pocket.   Coronary CalciumScan A coronary calcium scan is an imaging test used to look for deposits of calcium and other fatty materials (plaques) in the inner lining of the blood vessels of the heart (coronary arteries). These deposits of calcium and plaques can partly clog and narrow the coronary arteries without producing any symptoms or warning signs. This puts a person at risk for a heart attack. This test can detect these deposits before symptoms develop. Tell a health care provider about: Any allergies you have. All medicines you are taking, including vitamins, herbs, eye drops, creams, and over-the-counter medicines. Any problems you or family members have had with anesthetic medicines. Any blood disorders you have. Any surgeries you have had. Any medical conditions you have. Whether you are pregnant or may be pregnant. What are the risks? Generally, this is a safe procedure.  However, problems may occur, including: Harm to a pregnant woman and her unborn baby. This test involves the use of radiation. Radiation exposure can be dangerous to a pregnant woman and her unborn baby. If you are pregnant, you generally should not have this procedure done. Slight increase in the risk of cancer. This is because of the radiation involved in the test. What happens before the procedure? No preparation is needed for this procedure. What happens during the procedure? You will undress and remove any jewelry around your neck or chest. You will put on a hospital gown. Sticky electrodes will be placed on your chest. The electrodes will be connected to an electrocardiogram (ECG) machine to record a tracing of the electrical activity of your heart. A CT scanner will take pictures of your heart. During this time, you will be asked to lie still and hold your breath for 2-3 seconds while a picture of your heart is being taken. The procedure may vary among health care providers and hospitals. What happens after the procedure? You can get dressed. You can return to your normal activities. It is up to you to get the results of your test. Ask your health care provider, or the department that is doing the test, when your results will be ready. Summary A coronary calcium scan is an imaging test used to look for deposits of calcium and other fatty materials (plaques) in the inner lining of the blood vessels of the heart (coronary arteries). Generally, this is a safe procedure. Tell your health care provider if you are pregnant or may be pregnant. No preparation is needed for this procedure. A CT scanner will take pictures of your  heart. You can return to your normal activities after the scan is done. This information is not intended to replace advice given to you by your health care provider. Make sure you discuss any questions you have with your health care provider. Document Released: 01/07/2008  Document Revised: 05/30/2016 Document Reviewed: 05/30/2016 Elsevier Interactive Patient Education  2017 [employer].   Follow-Up: At [facility], you and your health needs are our priority.  As part of our continuing mission to provide you with exceptional heart care, we have created designated Provider Care Teams.  These Care Teams include your primary Cardiologist (physician) and Advanced Practice Providers (APPs -  Physician Assistants and Nurse Practitioners) who all work together to provide you with the care you need, when you need it.  We recommend signing up for the patient portal called "MyChart".  Sign up information is provided on this After Visit Summary.  MyChart is used to connect with patients for Virtual Visits (Telemedicine).  Patients are able to view lab/test results, encounter notes, upcoming appointments, etc.  Non-urgent messages can be sent to your provider as well.   To learn more about what you can do with MyChart, go to [email or web address].    Your next appointment:   6 month(s)  The format for your next appointment:   In Person  Provider:   Epifanio Lesches, MD    Please check your blood pressure at home twice daily, write it down.  Call the office or send message via Mychart with the readings in 1-2 weeks for Dr. Bjorn Pippin to review.    Signed, Little Ishikawa, MD  03/10/2021 4:31 PM    [facility]

## 2021-03-10 ENCOUNTER — Ambulatory Visit: Payer: Medicare PPO | Admitting: Cardiology

## 2021-03-10 ENCOUNTER — Encounter: Payer: Self-pay | Admitting: Cardiology

## 2021-03-10 ENCOUNTER — Ambulatory Visit (INDEPENDENT_AMBULATORY_CARE_PROVIDER_SITE_OTHER): Payer: Medicare PPO

## 2021-03-10 ENCOUNTER — Other Ambulatory Visit: Payer: Self-pay

## 2021-03-10 VITALS — BP 174/68 | HR 82 | Ht 61.0 in | Wt 167.0 lb

## 2021-03-10 DIAGNOSIS — I10 Essential (primary) hypertension: Secondary | ICD-10-CM | POA: Diagnosis not present

## 2021-03-10 DIAGNOSIS — R002 Palpitations: Secondary | ICD-10-CM | POA: Diagnosis not present

## 2021-03-10 DIAGNOSIS — R0602 Shortness of breath: Secondary | ICD-10-CM

## 2021-03-10 DIAGNOSIS — E785 Hyperlipidemia, unspecified: Secondary | ICD-10-CM | POA: Diagnosis not present

## 2021-03-10 NOTE — Patient Instructions (Addendum)
Medication Instructions:  Your physician recommends that you continue on your current medications as directed. Please refer to the Current Medication list given to you today.  Testing/Procedures: Your physician has requested that you have an echocardiogram. Echocardiography is a painless test that uses sound waves to create images of your heart. It provides your doctor with information about the size and shape of your heart and how well your heart's chambers and valves are working. This procedure takes approximately one hour. There are no restrictions for this procedure. This will be done at our [facility] location:  [address]  ZIO XT- Long Term Monitor Instructions   Your physician has requested you wear a ZIO patch monitor for _7_ days.  This is a single patch monitor.   [employer] supplies one patch monitor per enrollment. Additional stickers are not available. Please do not apply patch if you will be having a Nuclear Stress Test, Echocardiogram, Cardiac CT, MRI, or Chest Xray during the period you would be wearing the monitor. The patch cannot be worn during these tests. You cannot remove and re-apply the ZIO XT patch monitor.  Your ZIO patch monitor will be sent Fed Ex from [employer] directly to your home address. It may take 3-5 days to receive your monitor after you have been enrolled.  Once you have received your monitor, please review the enclosed instructions. Your monitor has already been registered assigning a specific monitor serial # to you.  Billing and Patient Assistance Program Information   We have supplied [employer] with any of your insurance information on file for billing purposes. [employer] offers a sliding scale Patient Assistance Program for patients that do not have insurance, or whose insurance does not completely cover the cost of the ZIO monitor.   You must apply for the Patient Assistance Program to qualify for this discounted rate.     To  apply, please call [employer] at [phone], select option 4, then select option 2, and ask to apply for Patient Assistance Program.  Meredeth Ide will ask your household income, and how many people are in your household.  They will quote your out-of-pocket cost based on that information.  [employer] will also be able to set up a 72-month, interest-free payment plan if needed.  Applying the monitor   Shave hair from upper left chest.  Hold abrader disc by orange tab. Rub abrader in 40 strokes over the upper left chest as indicated in your monitor instructions.  Clean area with 4 enclosed alcohol pads. Let dry.  Apply patch as indicated in monitor instructions. Patch will be placed under collarbone on left side of chest with arrow pointing upward.  Rub patch adhesive wings for 2 minutes. Remove white label marked "1". Remove the white label marked "2". Rub patch adhesive wings for 2 additional minutes.  While looking in a mirror, press and release button in center of patch. A small green light will flash 3-4 times. This will be your only indicator that the monitor has been turned on. ?  Do not shower for the first 24 hours. You may shower after the first 24 hours.  Press the button if you feel a symptom. You will hear a small click. Record Date, Time and Symptom in the Patient Logbook.  When you are ready to remove the patch, follow instructions on the last 2 pages of the Patient Logbook. Stick patch monitor onto the last page of Patient Logbook.  Place Patient Logbook in the blue  and white box.  Use locking tab on box and tape box closed securely.  The blue and white box has prepaid postage on it. Please place it in the mailbox as soon as possible. Your physician should have your test results approximately 7 days after the monitor has been mailed back to [facility].  Call [facility] Customer Care at [phone] if you have questions regarding your ZIO XT patch monitor. Call them immediately if you see  an orange light blinking on your monitor.  If your monitor falls off in less than 4 days, contact our Monitor department at [phone]. ?If your monitor becomes loose or falls off after 4 days call [employer] at [phone] for suggestions on securing your monitor.?  CT coronary calcium score. This test is done at [address]. This is $99 out of pocket.   Coronary CalciumScan A coronary calcium scan is an imaging test used to look for deposits of calcium and other fatty materials (plaques) in the inner lining of the blood vessels of the heart (coronary arteries). These deposits of calcium and plaques can partly clog and narrow the coronary arteries without producing any symptoms or warning signs. This puts a person at risk for a heart attack. This test can detect these deposits before symptoms develop. Tell a health care provider about: Any allergies you have. All medicines you are taking, including vitamins, herbs, eye drops, creams, and over-the-counter medicines. Any problems you or family members have had with anesthetic medicines. Any blood disorders you have. Any surgeries you have had. Any medical conditions you have. Whether you are pregnant or may be pregnant. What are the risks? Generally, this is a safe procedure. However, problems may occur, including: Harm to a pregnant woman and her unborn baby. This test involves the use of radiation. Radiation exposure can be dangerous to a pregnant woman and her unborn baby. If you are pregnant, you generally should not have this procedure done. Slight increase in the risk of cancer. This is because of the radiation involved in the test. What happens before the procedure? No preparation is needed for this procedure. What happens during the procedure? You will undress and remove any jewelry around your neck or chest. You will put on a hospital gown. Sticky electrodes will be placed on your chest. The electrodes will be  connected to an electrocardiogram (ECG) machine to record a tracing of the electrical activity of your heart. A CT scanner will take pictures of your heart. During this time, you will be asked to lie still and hold your breath for 2-3 seconds while a picture of your heart is being taken. The procedure may vary among health care providers and hospitals. What happens after the procedure? You can get dressed. You can return to your normal activities. It is up to you to get the results of your test. Ask your health care provider, or the department that is doing the test, when your results will be ready. Summary A coronary calcium scan is an imaging test used to look for deposits of calcium and other fatty materials (plaques) in the inner lining of the blood vessels of the heart (coronary arteries). Generally, this is a safe procedure. Tell your health care provider if you are pregnant or may be pregnant. No preparation is needed for this procedure. A CT scanner will take pictures of your heart. You can return to your normal activities after the scan is done. This information is not intended to replace  advice given to you by your health care provider. Make sure you discuss any questions you have with your health care provider. Document Released: 01/07/2008 Document Revised: 05/30/2016 Document Reviewed: 05/30/2016 Elsevier Interactive Patient Education  2017 [employer].   Follow-Up: At [facility], you and your health needs are our priority.  As part of our continuing mission to provide you with exceptional heart care, we have created designated Provider Care Teams.  These Care Teams include your primary Cardiologist (physician) and Advanced Practice Providers (APPs -  Physician Assistants and Nurse Practitioners) who all work together to provide you with the care you need, when you need it.  We recommend signing up for the patient portal called "MyChart".  Sign up information is provided on  this After Visit Summary.  MyChart is used to connect with patients for Virtual Visits (Telemedicine).  Patients are able to view lab/test results, encounter notes, upcoming appointments, etc.  Non-urgent messages can be sent to your provider as well.   To learn more about what you can do with MyChart, go to [email or web address].    Your next appointment:   6 month(s)  The format for your next appointment:   In Person  Provider:   Epifanio Lesches, MD    Please check your blood pressure at home twice daily, write it down.  Call the office or send message via Mychart with the readings in 1-2 weeks for Dr. Bjorn Pippin to review.

## 2021-03-10 NOTE — Progress Notes (Unsigned)
Patient enrolled for [employer] to mail a 7 day ZIO XT monitor to her address on file. 

## 2021-03-12 ENCOUNTER — Telehealth: Payer: Self-pay | Admitting: Cardiology

## 2021-03-12 NOTE — Telephone Encounter (Signed)
Called patient back, pt reports at her visit with Dr. Bjorn Pippin 8/17 she was instructed to call back with some blood pressure readings. She reports just one at this time, 148/83, pulse 74. Pt reports she is going to send the others via MyChart. Pt reports other than being tired she is not having symptoms, denies chest pain, headache, dizziness, or shortness of breath. Advised pt her message would be sent to Dr. Bjorn Pippin for review. All questions/concerns addressed at this time.

## 2021-03-12 NOTE — Telephone Encounter (Signed)
   Pt c/o BP issue: STAT if pt c/o blurred vision, one-sided weakness or slurred speech  1. What are your last 5 BP readings? 148/83 HR 74  2. Are you having any other symptoms (ex. Dizziness, headache, blurred vision, passed out)? none  3. What is your BP issue? Pt said she is calling to provide BP reading to Dr. Bjorn Pippin, she said she will try to send it through mychart her future readings.

## 2021-03-13 DIAGNOSIS — R002 Palpitations: Secondary | ICD-10-CM | POA: Diagnosis not present

## 2021-03-24 DIAGNOSIS — N3020 Other chronic cystitis without hematuria: Secondary | ICD-10-CM | POA: Diagnosis not present

## 2021-03-25 DIAGNOSIS — R002 Palpitations: Secondary | ICD-10-CM | POA: Diagnosis not present

## 2021-03-31 ENCOUNTER — Other Ambulatory Visit: Payer: Self-pay | Admitting: *Deleted

## 2021-03-31 MED ORDER — METOPROLOL TARTRATE 25 MG PO TABS: 25.0000 mg | ORAL_TABLET | Freq: Two times a day (BID) | ORAL | 3 refills | Status: DC

## 2021-04-04 NOTE — Progress Notes (Signed)
Cardiology Office Note:    Date:  04/05/2021   ID:  Connie [name], Plunk June 04, 1941, MRN [id number]  PCP:  Shon Hale, MD  Cardiologist:  None  Electrophysiologist:  None   Referring MD: Shon Hale, *   Chief Complaint  Patient presents with   Atrial Flutter      History of Present Illness:    Connie [name] is a 80 y.o. female with a hx of hypertension, hypothyroidism, hyperlipidemia, GERD, psoriatic arthritis who presents for follow-up.  He was referred by Dr Chanetta Marshall for evaluation of dyspnea and palpitations, initially seen on 03/10/2021.  She reports has been having palpitations for the last 3 months.  States that palpitations occur at rest, feels like fluttering in her chest.  Has been occurring daily, last for few minutes.  Denies any lightheadedness or syncope.  Reports feels short of breath during palpitations.  Also has dyspnea when she walks fast.  She denies any chest pain.  Denies any lower extremity edema.  She does not exercise.  No smoking history.  Family history includes father died of MI at age 38.  Zio patch x7 days on 03/25/2021 showed 2% atrial flutter burden, average rate 141 bpm.  Longest episode lasted 15 minutes.  Also with occasional PACs, 2.3% of beats.  Since last clinic visit, reports palpitations have improved.  Denies any chest pain, dyspnea, lightheadedness, syncope, lower extremity edema.  Husband reports she snores.  States that she feels tired throughout the day.   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   conjugated estrogens (PREMARIN) vaginal cream Place 1 Applicatorful vaginally 2 (two) times a week.   COVID-19 mRNA Vac-TriS, Pfizer, (PFIZER-BIONT COVID-19 VAC-TRIS) SUSP injection Inject into the muscle.   hydrochlorothiazide (HYDRODIURIL) 25 MG tablet Take 25 mg by mouth daily.   HYDROcodone-acetaminophen (NORCO) 5-325 MG tablet Take 1 tablet by mouth every 6 (six) hours as needed.   irbesartan (AVAPRO) 150 MG tablet Take 150 mg by mouth daily.   levothyroxine (SYNTHROID, LEVOTHROID) 75 MCG tablet Take 75 mcg by mouth daily before breakfast.   lovastatin (MEVACOR) 40 MG tablet Take  80 mg by mouth daily.    metoprolol tartrate (LOPRESSOR) 25 MG tablet Take 1 tablet (25 mg total) by mouth 2 (two) times daily.   Multiple Vitamins-Minerals (PRESERVISION AREDS 2 PO) Take 1 capsule by mouth 2 (two) times daily.   naproxen sodium (ALEVE) 220 MG tablet Take 220 mg by mouth daily as needed.   sertraline (ZOLOFT) 100 MG tablet Take 100 mg by mouth daily.   tiZANidine (ZANAFLEX) 2 MG tablet Take 1 tablet (2 mg total) by mouth every 6 (six) hours as needed.     Allergies:   Penicillins and Sulfa antibiotics   Social History   Socioeconomic History   Marital status: Married     Spouse name: Not on file   Number of children: Not on file   Years of education: Not on file   Highest education level: Not on file  Occupational History   Not on file  Tobacco Use   Smoking status: Never   Smokeless tobacco: Never  Vaping Use   Vaping Use: Never used  Substance and Sexual Activity   Alcohol use: Yes    Comment: rarely wine   Drug use: No   Sexual activity: Not on file  Other Topics Concern   Not on file  Social History Narrative   Not on file   Social Determinants of Health   Financial Resource Strain: Not on file  Food Insecurity: Not on file  Transportation Needs: Not on file  Physical Activity: Not on file  Stress: Not on file  Social Connections: Not on file     Family History: The patient's family history is negative for Breast cancer.  ROS:   Please see the history of present illness.     All other systems reviewed and are negative.  EKGs/Labs/Other Studies Reviewed:    The following studies were reviewed today:   EKG:  EKG is  ordered today.  The ekg ordered today demonstrates normal sinus rhythm, rate 60, low voltage, right bundle branch block, left anterior fascicular block, Q waves in leads III, aVF  Recent Labs: No results found for requested labs within last 8760 hours.  Recent Lipid Panel No results found for: CHOL, TRIG, HDL, CHOLHDL, VLDL, LDLCALC, LDLDIRECT  Physical Exam:    VS:  BP 130/70 (BP Location: Right Arm, Patient Position: Sitting, Cuff Size: Normal)   Pulse 60   Ht 5\' 1"  (1.549 m)   Wt 164 lb 3.2 oz (74.5 kg)   SpO2 98%   BMI 31.03 kg/m     Wt Readings from Last 3 Encounters:  04/05/21 164 lb 3.2 oz (74.5 kg)  03/10/21 167 lb (75.8 kg)  11/17/17 177 lb 6 oz (80.5 kg)     GEN:  Well nourished, well developed in no acute distress HEENT: Normal NECK: No JVD; No carotid bruits LYMPHATICS: No lymphadenopathy CARDIAC: RRR, no murmurs, rubs, gallops RESPIRATORY:  Clear to auscultation without rales,  wheezing or rhonchi  ABDOMEN: Soft, non-tender, non-distended MUSCULOSKELETAL:  No edema; No deformity  SKIN: Warm and dry NEUROLOGIC:  Alert and oriented x 3 PSYCHIATRIC:  Normal affect   ASSESSMENT:    1. Atrial flutter, unspecified type (HCC)   2. Shortness of breath   3. Essential hypertension   4. Hyperlipidemia, unspecified hyperlipidemia type   5. Snoring     PLAN:    Atria flutter: Zio patch x7 days on 03/25/2021 showed 2% atrial flutter burden, average rate 141 bpm.  Longest episode lasted 15 minutes.  CHA2DS2-VASc  score 4 (hypertension, age x2, female).  No history of bleeding issues. -Start Eliquis 5 mg twice daily.  Check CMP, CBC, TSH -Started metoprolol 25 mg twice daily -Check echocardiogram -Sleep study  Dyspnea: Suspect due to deconditioning.  However does have abnormal EKG with Q waves in inferior and anterior leads, will check echocardiogram to rule out structural heart disease  Hypertension: On irbesartan 150 mg daily and hydrochlorothiazide 25 mg daily and metoprolol 25 mg twice daily  Hyperlipidemia: On lovastatin 80 mg daily.  LDL 115 on 08/03/2020.  Will check calcium score to guide how aggressive to be in lowering cholesterol.  Snoring/daytime somnolence: check sleep study  RTC in 6 months   Medication Adjustments/Labs and Tests Ordered: Current medicines are reviewed at length with the patient today.  Concerns regarding medicines are outlined above.  No orders of the defined types were placed in this encounter.   No orders of the defined types were placed in this encounter.   There are no Patient Instructions on file for this visit.   Signed, Little Ishikawa, MD  04/05/2021 10:12 AM    [facility]

## 2021-04-05 ENCOUNTER — Other Ambulatory Visit: Payer: Self-pay

## 2021-04-05 ENCOUNTER — Ambulatory Visit: Payer: Medicare PPO | Admitting: Cardiology

## 2021-04-05 ENCOUNTER — Encounter: Payer: Self-pay | Admitting: Cardiology

## 2021-04-05 VITALS — BP 130/70 | HR 60 | Ht 61.0 in | Wt 164.2 lb

## 2021-04-05 DIAGNOSIS — I4892 Unspecified atrial flutter: Secondary | ICD-10-CM

## 2021-04-05 DIAGNOSIS — R0683 Snoring: Secondary | ICD-10-CM | POA: Diagnosis not present

## 2021-04-05 DIAGNOSIS — E785 Hyperlipidemia, unspecified: Secondary | ICD-10-CM | POA: Diagnosis not present

## 2021-04-05 DIAGNOSIS — I10 Essential (primary) hypertension: Secondary | ICD-10-CM

## 2021-04-05 DIAGNOSIS — R0602 Shortness of breath: Secondary | ICD-10-CM | POA: Diagnosis not present

## 2021-04-05 LAB — COMPREHENSIVE METABOLIC PANEL
ALT: 11 IU/L (ref 0–32)
AST: 19 IU/L (ref 0–40)
Albumin/Globulin Ratio: 2.2 (ref 1.2–2.2)
Albumin: 4.6 g/dL (ref 3.6–4.6)
Alkaline Phosphatase: 153 IU/L — ABNORMAL HIGH (ref 44–121)
BUN/Creatinine Ratio: 21 (ref 12–28)
BUN: 18 mg/dL (ref 8–27)
Bilirubin Total: 0.4 mg/dL (ref 0.0–1.2)
CO2: 21 mmol/L (ref 20–29)
Calcium: 9.7 mg/dL (ref 8.7–10.3)
Chloride: 94 mmol/L — ABNORMAL LOW (ref 96–106)
Creatinine, Ser: 0.86 mg/dL (ref 0.57–1.00)
Globulin, Total: 2.1 g/dL (ref 1.5–4.5)
Glucose: 86 mg/dL (ref 65–99)
Potassium: 5 mmol/L (ref 3.5–5.2)
Sodium: 130 mmol/L — ABNORMAL LOW (ref 134–144)
Total Protein: 6.7 g/dL (ref 6.0–8.5)
eGFR: 67 mL/min/{1.73_m2} (ref >59)

## 2021-04-05 LAB — CBC
Hematocrit: 39.6 % (ref 34.0–46.6)
Hemoglobin: 13.6 g/dL (ref 11.1–15.9)
MCH: 28.6 pg (ref 26.6–33.0)
MCHC: 34.3 g/dL (ref 31.5–35.7)
MCV: 83 fL (ref 79–97)
Platelets: 378 10*3/uL (ref 150–450)
RBC: 4.76 x10E6/uL (ref 3.77–5.28)
RDW: 12.5 % (ref 11.7–15.4)
WBC: 10.5 10*3/uL (ref 3.4–10.8)

## 2021-04-05 LAB — TSH: TSH: 0.798 u[IU]/mL (ref 0.450–4.500)

## 2021-04-05 MED ORDER — APIXABAN 5 MG PO TABS: 5.0000 mg | ORAL_TABLET | Freq: Two times a day (BID) | ORAL | 3 refills | Status: DC

## 2021-04-05 MED ORDER — APIXABAN 5 MG PO TABS: 5.0000 mg | ORAL_TABLET | Freq: Two times a day (BID) | ORAL | 0 refills | Status: DC

## 2021-04-05 NOTE — Patient Instructions (Signed)
Medication Instructions:  START Eliquis 5 mg two times daily  *If you need a refill on your cardiac medications before your next appointment, please call your pharmacy*   Lab Work: CMET, CBC, TSH today  If you have labs (blood work) drawn today and your tests are completely normal, you will receive your results only by: MyChart Message (if you have MyChart) OR A paper copy in the mail If you have any lab test that is abnormal or we need to change your treatment, we will call you to review the results.   Testing/Procedures: Your physician has recommended that you have a sleep study. This test records several body functions during sleep, including: brain activity, eye movement, oxygen and carbon dioxide blood levels, heart rate and rhythm, breathing rate and rhythm, the flow of air through your mouth and nose, snoring, body muscle movements, and chest and belly movement.  Follow-Up: At [facility], you and your health needs are our priority.  As part of our continuing mission to provide you with exceptional heart care, we have created designated Provider Care Teams.  These Care Teams include your primary Cardiologist (physician) and Advanced Practice Providers (APPs -  Physician Assistants and Nurse Practitioners) who all work together to provide you with the care you need, when you need it.  We recommend signing up for the patient portal called "MyChart".  Sign up information is provided on this After Visit Summary.  MyChart is used to connect with patients for Virtual Visits (Telemedicine).  Patients are able to view lab/test results, encounter notes, upcoming appointments, etc.  Non-urgent messages can be sent to your provider as well.   To learn more about what you can do with MyChart, go to [email or web address].    Your next appointment:   As scheduled with Dr. Bjorn Pippin

## 2021-04-06 ENCOUNTER — Telehealth: Payer: Self-pay | Admitting: *Deleted

## 2021-04-06 ENCOUNTER — Other Ambulatory Visit: Payer: Self-pay | Admitting: Cardiology

## 2021-04-06 DIAGNOSIS — I10 Essential (primary) hypertension: Secondary | ICD-10-CM

## 2021-04-06 DIAGNOSIS — R0683 Snoring: Secondary | ICD-10-CM

## 2021-04-06 NOTE — Telephone Encounter (Signed)
Patient notified of HST appointment. [employer] denied in lab sleep study. Ok per Dr Bjorn Pippin to do HST.

## 2021-04-07 ENCOUNTER — Ambulatory Visit (HOSPITAL_COMMUNITY): Payer: Medicare PPO | Attending: Cardiovascular Disease

## 2021-04-07 ENCOUNTER — Ambulatory Visit (INDEPENDENT_AMBULATORY_CARE_PROVIDER_SITE_OTHER)
Admission: RE | Admit: 2021-04-07 | Discharge: 2021-04-07 | Disposition: A | Discharge status: Home / Self Care | Payer: Self-pay | Source: Ambulatory Visit | Attending: Cardiology | Admitting: Cardiology

## 2021-04-07 ENCOUNTER — Other Ambulatory Visit: Payer: Self-pay

## 2021-04-07 DIAGNOSIS — R0602 Shortness of breath: Secondary | ICD-10-CM | POA: Diagnosis not present

## 2021-04-07 DIAGNOSIS — R002 Palpitations: Secondary | ICD-10-CM

## 2021-04-07 LAB — ECHOCARDIOGRAM COMPLETE
Area-P 1/2: 3.78 cm2
S' Lateral: 2.7 cm

## 2021-04-12 ENCOUNTER — Other Ambulatory Visit: Payer: Self-pay | Admitting: *Deleted

## 2021-04-12 MED ORDER — ROSUVASTATIN CALCIUM 10 MG PO TABS: 10.0000 mg | ORAL_TABLET | Freq: Every day | ORAL | 3 refills | Status: DC

## 2021-04-15 NOTE — Addendum Note (Signed)
Addended by: Almedia Balls on: 04/15/2021 03:05 PM   Modules accepted: Orders

## 2021-04-15 NOTE — Addendum Note (Signed)
Addended by: Almedia Balls on: 04/15/2021 03:03 PM   Modules accepted: Orders

## 2021-04-27 ENCOUNTER — Encounter (HOSPITAL_BASED_OUTPATIENT_CLINIC_OR_DEPARTMENT_OTHER): Payer: Medicare PPO | Admitting: Cardiovascular Disease

## 2021-05-12 ENCOUNTER — Other Ambulatory Visit (HOSPITAL_BASED_OUTPATIENT_CLINIC_OR_DEPARTMENT_OTHER): Payer: Self-pay

## 2021-05-12 ENCOUNTER — Other Ambulatory Visit: Payer: Self-pay

## 2021-05-12 ENCOUNTER — Ambulatory Visit: Payer: Medicare PPO | Attending: Internal Medicine

## 2021-05-12 DIAGNOSIS — Z23 Encounter for immunization: Secondary | ICD-10-CM

## 2021-05-12 MED ORDER — PFIZER COVID-19 VAC BIVALENT 30 MCG/0.3ML IM SUSP
INTRAMUSCULAR | 0 refills | Status: DC
  Filled 2021-05-12: qty 0.3, 1d supply, fill #0

## 2021-05-12 NOTE — Progress Notes (Signed)
   Covid-19 Vaccination Clinic  Name:  Delisha Peaden    MRN: [id number] DOB: November 15, 1940  05/12/2021  Ms. Zawadzki was observed post Covid-19 immunization for 15 minutes without incident. She was provided with Vaccine Information Sheet and instruction to access the V-Safe system.   Ms. Robinson was instructed to call 911 with any severe reactions post vaccine: Difficulty breathing  Swelling of face and throat  A fast heartbeat  A bad rash all over body  Dizziness and weakness   Immunizations Administered     Name Date Dose VIS Date Route   Pfizer Covid-19 Vaccine Bivalent Booster 05/12/2021  3:04 PM 0.3 mL 03/24/2021 Intramuscular   Manufacturer: [employer], [employer]   Lot: [id number]   NDC: [phone]

## 2021-06-10 DIAGNOSIS — H35423 Microcystoid degeneration of retina, bilateral: Secondary | ICD-10-CM | POA: Diagnosis not present

## 2021-06-10 DIAGNOSIS — H3540 Unspecified peripheral retinal degeneration: Secondary | ICD-10-CM | POA: Diagnosis not present

## 2021-06-10 DIAGNOSIS — H35433 Paving stone degeneration of retina, bilateral: Secondary | ICD-10-CM | POA: Diagnosis not present

## 2021-06-10 DIAGNOSIS — H353232 Exudative age-related macular degeneration, bilateral, with inactive choroidal neovascularization: Secondary | ICD-10-CM | POA: Diagnosis not present

## 2021-07-05 DIAGNOSIS — H353133 Nonexudative age-related macular degeneration, bilateral, advanced atrophic without subfoveal involvement: Secondary | ICD-10-CM | POA: Diagnosis not present

## 2021-07-05 DIAGNOSIS — H524 Presbyopia: Secondary | ICD-10-CM | POA: Diagnosis not present

## 2021-07-05 DIAGNOSIS — Z961 Presence of intraocular lens: Secondary | ICD-10-CM | POA: Diagnosis not present

## 2021-07-06 ENCOUNTER — Telehealth: Payer: Self-pay

## 2021-07-06 NOTE — Telephone Encounter (Signed)
Letter has been sent to patient instructing them to call us if they are still interested in completing their sleep study. If we have not received a response from the patient within 30 days of this notice, the order will be cancelled and they will need to discuss the need for a sleep study at their next office visit.  ° °

## 2021-07-26 NOTE — Progress Notes (Signed)
Cardiology Office Note:    Date:  07/28/2021   ID:  Connie [name], Connie [name] 10/14/1940, MRN [id number]  PCP:  Shon Hale, MD  Cardiologist:  None  Electrophysiologist:  None   Referring MD: Shon Hale, *   Chief Complaint  Patient presents with   Atrial Flutter      History of Present Illness:    Connie [name] is a 81 y.o. female with a hx of paroxysmal atrial flutter, hypertension, hypothyroidism, hyperlipidemia, GERD, psoriatic arthritis who presents for follow-up.  He was referred by Dr Chanetta Marshall for evaluation of dyspnea and palpitations, initially seen on 03/10/2021.  She reports has been having palpitations for the last 3 months.  States that palpitations occur at rest, feels like fluttering in her chest.  Has been occurring daily, last for few minutes.  Denies any lightheadedness or syncope.  Reports feels short of breath during palpitations.  Also has dyspnea when she walks fast.  She denies any chest pain.  Denies any lower extremity edema.  She does not exercise.  No smoking history.  Family history includes father died of MI at age 40.  Zio patch x7 days on 03/25/2021 showed 2% atrial flutter burden, average rate 141 bpm.  Longest episode lasted 15 minutes.  Also with occasional PACs, 2.3% of beats.  Echocardiogram 04/07/2021 showed normal biventricular function, no significant valvular disease.  Calcium score on 04/07/2021 was 604 (81st percentile).  Since last clinic visit, she reports that she has been doing well.  Has had a couple episodes of palpitations lasting about 30 seconds.  She denies any chest pain.  Denies any lightheadedness or syncope.  Reports BP has been 130s over 70s at home.  She has not been taking Eliquis.   BP Readings from Last 3 Encounters:  07/27/21 (!) 164/72  04/05/21 130/70  03/10/21 (!) 174/68      Past Medical History:  Diagnosis Date   Arthritis    Depression    GERD (gastroesophageal reflux disease)    Hepatic  cyst 10/27/2016   Multiple, noted on [place]   History of hiatal hernia    Hypertension    Hypothyroidism    Macular degeneration    Psoriatic arthritis (HCC)    Sinus drainage    UTI (urinary tract infection)     Past Surgical History:  Procedure Laterality Date   BACK SURGERY  2000   Dr Newell Coral   bladder tack  2005   COLONOSCOPY     DILATION AND CURETTAGE OF UTERUS     after miscarriage   RECTAL PROLAPSE REPAIR  2008   SCAR REVISION Right 2017   Knee, Dr. 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   carvedilol (COREG) 6.25 MG tablet Take 1 tablet (6.25 mg total) by mouth 2 (two) times daily.   conjugated estrogens (PREMARIN) vaginal cream Place 1 Applicatorful vaginally 2 (two) times a  week.   COVID-19 mRNA bivalent vaccine, Pfizer, (PFIZER COVID-19 VAC BIVALENT) injection Inject into the muscle.   COVID-19 mRNA Vac-TriS, Pfizer, (PFIZER-BIONT COVID-19 VAC-TRIS) SUSP injection Inject into the muscle.   hydrochlorothiazide (HYDRODIURIL) 25 MG tablet Take 25 mg by mouth daily.   HYDROcodone-acetaminophen (NORCO) 5-325 MG tablet Take 1 tablet by mouth every 6 (six) hours as needed.   irbesartan (AVAPRO) 150 MG tablet Take 150 mg by mouth daily.   levothyroxine (SYNTHROID, LEVOTHROID) 75 MCG tablet Take 75 mcg by mouth daily before breakfast.   Multiple Vitamins-Minerals (PRESERVISION AREDS 2 PO) Take 1 capsule by mouth 2 (two) times  daily.   naproxen sodium (ALEVE) 220 MG tablet Take 220 mg by mouth daily as needed.   rosuvastatin (CRESTOR) 10 MG tablet Take 1 tablet (10 mg total) by mouth daily.   sertraline (ZOLOFT) 100 MG tablet Take 100 mg by mouth daily.   tiZANidine (ZANAFLEX) 2 MG tablet Take 1 tablet (2 mg total) by mouth every 6 (six) hours as needed.   [DISCONTINUED] metoprolol tartrate (LOPRESSOR) 25 MG tablet Take 1 tablet (25 mg total) by mouth 2 (two) times daily.     Allergies:   Penicillins and Sulfa antibiotics   Social History   Socioeconomic History   Marital status: Married    Spouse name: Not on file   Number of children: Not on file   Years of education: Not on file   Highest education level: Not on file  Occupational History   Not on file  Tobacco Use   Smoking status: Never   Smokeless tobacco: Never  Vaping Use   Vaping Use: Never used  Substance and Sexual Activity   Alcohol use: Yes    Comment: rarely wine   Drug use: No   Sexual activity: Not on file  Other Topics Concern   Not on file  Social History Narrative   Not on file   Social Determinants of Health   Financial Resource Strain: Not on file  Food Insecurity: Not on file  Transportation Needs: Not on file  Physical Activity: Not on file  Stress: Not on file  Social Connections: Not on file     Family History: The patient's family history is negative for Breast cancer.  ROS:   Please see the history of present illness.     All other systems reviewed and are negative.  EKGs/Labs/Other Studies Reviewed:    The following studies were reviewed today:   EKG:  EKG is  ordered today.  The ekg ordered today demonstrates normal sinus rhythm, rate 60, low voltage, right bundle branch block, left anterior fascicular block, Q waves in leads III, aVF  Recent Labs: 04/05/2021: ALT 11; BUN 18; Creatinine, Ser 0.86; Hemoglobin 13.6; Platelets 378; Potassium 5.0; Sodium 130; TSH 0.798  Recent Lipid Panel No results  found for: CHOL, TRIG, HDL, CHOLHDL, VLDL, LDLCALC, LDLDIRECT  Physical Exam:    VS:  BP (!) 164/72    Pulse 75    Ht 5\' 1"  (1.549 m)    Wt 168 lb 9.6 oz (76.5 kg)    SpO2 98%    BMI 31.86 kg/m     Wt Readings from Last 3 Encounters:  07/27/21 168 lb 9.6 oz (76.5 kg)  04/05/21 164 lb 3.2 oz (74.5 kg)  03/10/21 167 lb (75.8 kg)     GEN:  Well nourished, well developed in no acute distress HEENT: Normal NECK: No JVD; No carotid bruits LYMPHATICS: No lymphadenopathy CARDIAC: RRR, no  murmurs, rubs, gallops RESPIRATORY:  Clear to auscultation without rales, wheezing or rhonchi  ABDOMEN: Soft, non-tender, non-distended MUSCULOSKELETAL:  No edema; No deformity  SKIN: Warm and dry NEUROLOGIC:  Alert and oriented x 3 PSYCHIATRIC:  Normal affect   ASSESSMENT:    1. Atrial flutter, unspecified type (HCC)   2. Shortness of breath   3. Hyperlipidemia, unspecified hyperlipidemia type   4. Coronary artery disease involving native coronary artery of native heart, unspecified whether angina present   5. Essential hypertension, benign      PLAN:    Atria flutter: Zio patch x7 days on 03/25/2021 showed 2% atrial flutter burden, average rate 141 bpm.  Longest episode lasted 15 minutes.  CHA2DS2-VASc score 4 (hypertension, age x2, female).  No history of bleeding issues.  Echocardiogram 04/07/2021 showed normal biventricular function, no significant valvular disease. -On Eliquis 5 mg twice daily, but she reports has not been taking, recommend starting -Continue beta-blocker, will switch to carvedilol 6.25 mg twice daily for better BP control -Sleep study recommended but she declines at this time.  CAD: Calcium score on 04/07/2021 was 604 (81st percentile).  Having dyspnea.  Recommend Lexiscan Myoview to rule out ischemia.  Hypertension: On irbesartan 150 mg daily and hydrochlorothiazide 25 mg daily and metoprolol 25 mg twice daily.  BP elevated, will switch metoprolol to carvedilol 6.25 mg twice  daily.  Hyperlipidemia: On lovastatin 80 mg daily LDL 115 on 08/03/2020.  Calcium score on 04/07/2021 was 604 (81st percentile).  Switched to rosuvastatin 10 mg daily 03/2021.  Will recheck lipid panel.   RTC in 6 months  Shared Decision Making/Informed Consent The risks [chest pain, shortness of breath, cardiac arrhythmias, dizziness, blood pressure fluctuations, myocardial infarction, stroke/transient ischemic attack, nausea, vomiting, allergic reaction, radiation exposure, metallic taste sensation and life-threatening complications (estimated to be 1 in 10,000)], benefits (risk stratification, diagnosing coronary artery disease, treatment guidance) and alternatives of a nuclear stress test were discussed in detail with Ms. Haughey and she agrees to proceed.    Medication Adjustments/Labs and Tests Ordered: Current medicines are reviewed at length with the patient today.  Concerns regarding medicines are outlined above.  Orders Placed This Encounter  Procedures   Lipid panel   MYOCARDIAL PERFUSION IMAGING     Meds ordered this encounter  Medications   carvedilol (COREG) 6.25 MG tablet    Sig: Take 1 tablet (6.25 mg total) by mouth 2 (two) times daily.    Dispense:  180 tablet    Refill:  3     Patient Instructions  Medication Instructions:   STOP METOPROLOL  START CARVEDILOL 6.25 MG ONE TABLET TWICE DAILY  *If you need a refill on your cardiac medications before your next appointment, please call your pharmacy*   Lab Work:  Your physician recommends that you HAVE LAB WORK TODAY  If you have labs (blood work) drawn today and your tests are completely normal, you will receive your results only by: MyChart Message (if you have MyChart) OR A paper copy in the mail If you have any lab test that is abnormal or we need to change your treatment, we will call you to review the results.   Testing/Procedures:  Your physician has requested that you have a lexiscan myoview. For  further information please visit [email or web address] Please follow instruction sheet, as given. [address]   Follow-Up: At [facility], you and your health needs are our priority.  As part of our continuing mission to provide you with exceptional  heart care, we have created designated Provider Care Teams.  These Care Teams include your primary Cardiologist (physician) and Advanced Practice Providers (APPs -  Physician Assistants and Nurse Practitioners) who all work together to provide you with the care you need, when you need it.  We recommend signing up for the patient portal called "MyChart".  Sign up information is provided on this After Visit Summary.  MyChart is used to connect with patients for Virtual Visits (Telemedicine).  Patients are able to view lab/test results, encounter notes, upcoming appointments, etc.  Non-urgent messages can be sent to your provider as well.   To learn more about what you can do with MyChart, go to [email or web address].    Your next appointment:   6 month(s)  The format for your next appointment:   In Person  Provider:   Epifanio LeschesHRISTOPHER Edwyn Inclan MD    Other Instructions  CHECK BLOOD PRESSURE ONCE DAILY-AT LEAST 2 HOURS AFTER TAKING MEDICATIONS- FOR 2 WEEKS AND CALL WITH THE RESULTS    Signed, [place] Ish[place]ahristopher L Samayah Novinger, MD  07/28/2021 12:15 AM    [facility]

## 2021-07-27 ENCOUNTER — Ambulatory Visit: Payer: Medicare PPO | Admitting: Cardiology

## 2021-07-27 ENCOUNTER — Encounter: Payer: Self-pay | Admitting: Cardiology

## 2021-07-27 ENCOUNTER — Other Ambulatory Visit: Payer: Self-pay

## 2021-07-27 VITALS — BP 164/72 | HR 75 | Ht 61.0 in | Wt 168.6 lb

## 2021-07-27 DIAGNOSIS — I10 Essential (primary) hypertension: Secondary | ICD-10-CM

## 2021-07-27 DIAGNOSIS — R0602 Shortness of breath: Secondary | ICD-10-CM | POA: Diagnosis not present

## 2021-07-27 DIAGNOSIS — I2510 Atherosclerotic heart disease of native coronary artery without angina pectoris: Secondary | ICD-10-CM | POA: Diagnosis not present

## 2021-07-27 DIAGNOSIS — E785 Hyperlipidemia, unspecified: Secondary | ICD-10-CM

## 2021-07-27 DIAGNOSIS — I4892 Unspecified atrial flutter: Secondary | ICD-10-CM

## 2021-07-27 MED ORDER — CARVEDILOL 6.25 MG PO TABS: 6.2500 mg | ORAL_TABLET | Freq: Two times a day (BID) | ORAL | 3 refills | Status: DC

## 2021-07-27 NOTE — Patient Instructions (Signed)
Medication Instructions:   STOP METOPROLOL  START CARVEDILOL 6.25 MG ONE TABLET TWICE DAILY  *If you need a refill on your cardiac medications before your next appointment, please call your pharmacy*   Lab Work:  Your physician recommends that you HAVE LAB WORK TODAY  If you have labs (blood work) drawn today and your tests are completely normal, you will receive your results only by: MyChart Message (if you have MyChart) OR A paper copy in the mail If you have any lab test that is abnormal or we need to change your treatment, we will call you to review the results.   Testing/Procedures:  Your physician has requested that you have a lexiscan myoview. For further information please visit [email or web address] Please follow instruction sheet, as given. [address]   Follow-Up: At [facility], you and your health needs are our priority.  As part of our continuing mission to provide you with exceptional heart care, we have created designated Provider Care Teams.  These Care Teams include your primary Cardiologist (physician) and Advanced Practice Providers (APPs -  Physician Assistants and Nurse Practitioners) who all work together to provide you with the care you need, when you need it.  We recommend signing up for the patient portal called "MyChart".  Sign up information is provided on this After Visit Summary.  MyChart is used to connect with patients for Virtual Visits (Telemedicine).  Patients are able to view lab/test results, encounter notes, upcoming appointments, etc.  Non-urgent messages can be sent to your provider as well.   To learn more about what you can do with MyChart, go to [email or web address].    Your next appointment:   6 month(s)  The format for your next appointment:   In Person  Provider:   Epifanio Lesches MD    Other Instructions  CHECK BLOOD PRESSURE ONCE DAILY-AT LEAST 2 HOURS AFTER TAKING MEDICATIONS- FOR 2 WEEKS AND CALL WITH  THE RESULTS

## 2021-07-28 ENCOUNTER — Encounter: Payer: Self-pay | Admitting: Cardiology

## 2021-07-28 LAB — LIPID PANEL
Chol/HDL Ratio: 3 ratio (ref 0.0–4.4)
Cholesterol, Total: 151 mg/dL (ref 100–199)
HDL: 50 mg/dL (ref >39)
LDL Chol Calc (NIH): 75 mg/dL (ref 0–99)
Triglycerides: 152 mg/dL — ABNORMAL HIGH (ref 0–149)
VLDL Cholesterol Cal: 26 mg/dL (ref 5–40)

## 2021-08-03 ENCOUNTER — Telehealth (HOSPITAL_COMMUNITY): Payer: Self-pay | Admitting: *Deleted

## 2021-08-03 NOTE — Telephone Encounter (Signed)
Left message on voicemail per DPR in reference to upcoming appointment scheduled on  08/10/20 with detailed instructions given per Myocardial Perfusion Study Information Sheet for the test. LM to arrive 15 minutes early, and that it is imperative to arrive on time for appointment to keep from having the test rescheduled. If you need to cancel or reschedule your appointment, please call the office within 24 hours of your appointment. Failure to do so may result in a cancellation of your appointment, and a $50 no show fee. Phone number given for call back for any questions. Kirstie Peri

## 2021-08-10 ENCOUNTER — Other Ambulatory Visit: Payer: Self-pay

## 2021-08-10 ENCOUNTER — Ambulatory Visit (HOSPITAL_COMMUNITY): Payer: Medicare PPO | Attending: Cardiology

## 2021-08-10 DIAGNOSIS — R0602 Shortness of breath: Secondary | ICD-10-CM | POA: Insufficient documentation

## 2021-08-10 LAB — MYOCARDIAL PERFUSION IMAGING
Base ST Depression (mm): 0 mm
LV dias vol: 53 mL (ref 46–106)
LV sys vol: 22 mL
Nuc Stress EF: 79 %
Peak HR: 100 {beats}/min
Rest HR: 69 {beats}/min
Rest Nuclear Isotope Dose: 10.9 mCi
SDS: 2
SRS: 0
SSS: 2
ST Depression (mm): 0 mm
Stress Nuclear Isotope Dose: 30.8 mCi
TID: 1.04

## 2021-08-10 MED ORDER — TECHNETIUM TC 99M TETROFOSMIN IV KIT
10.9000 | PACK | Freq: Once | INTRAVENOUS | Status: AC | PRN
  Administered 2021-08-10: 10.9 via INTRAVENOUS
  Filled 2021-08-10: qty 11

## 2021-08-10 MED ORDER — TECHNETIUM TC 99M TETROFOSMIN IV KIT
30.8000 | PACK | Freq: Once | INTRAVENOUS | Status: AC | PRN
  Administered 2021-08-10: 30.8 via INTRAVENOUS
  Filled 2021-08-10: qty 31

## 2021-08-10 MED ORDER — REGADENOSON 0.4 MG/5ML IV SOLN
0.4000 mg | Freq: Once | INTRAVENOUS | Status: AC
  Administered 2021-08-10: 0.4 mg via INTRAVENOUS

## 2021-08-19 ENCOUNTER — Encounter: Payer: Self-pay | Admitting: Cardiology

## 2021-10-06 ENCOUNTER — Encounter: Payer: Self-pay | Admitting: Cardiology

## 2021-10-20 ENCOUNTER — Encounter: Payer: Self-pay | Admitting: Podiatry

## 2021-10-20 ENCOUNTER — Ambulatory Visit: Payer: Medicare PPO | Admitting: Podiatry

## 2021-10-20 ENCOUNTER — Ambulatory Visit (INDEPENDENT_AMBULATORY_CARE_PROVIDER_SITE_OTHER): Payer: Medicare PPO

## 2021-10-20 DIAGNOSIS — M2042 Other hammer toe(s) (acquired), left foot: Secondary | ICD-10-CM

## 2021-10-20 DIAGNOSIS — M7751 Other enthesopathy of right foot: Secondary | ICD-10-CM | POA: Diagnosis not present

## 2021-10-20 DIAGNOSIS — M2041 Other hammer toe(s) (acquired), right foot: Secondary | ICD-10-CM

## 2021-10-20 DIAGNOSIS — G629 Polyneuropathy, unspecified: Secondary | ICD-10-CM | POA: Diagnosis not present

## 2021-10-20 DIAGNOSIS — M7752 Other enthesopathy of left foot: Secondary | ICD-10-CM

## 2021-10-20 MED ORDER — GABAPENTIN 300 MG PO CAPS: 300.0000 mg | ORAL_CAPSULE | Freq: Three times a day (TID) | ORAL | 3 refills | Status: DC

## 2021-10-20 NOTE — Progress Notes (Signed)
Subjective:  ? ?Patient ID: Connie [name], female   DOB: 81 y.o.   MRN: [id number]  ? ?HPI ?Patient presents with digital deformities bilateral and long-term tingling and stinging of her feet for the last few months.  Is interested in what could be done to try to help her with the stinging burning being worse.  Patient does not smoke likes to be active ? ? ?Review of Systems  ?All other systems reviewed and are negative. ? ? ?   ?Objective:  ?Physical Exam ?Vitals and nursing note reviewed.  ?Constitutional:   ?   Appearance: She is well-developed.  ?Pulmonary:  ?   Effort: Pulmonary effort is normal.  ?Musculoskeletal:     ?   General: Normal range of motion.  ?Skin: ?   General: Skin is warm.  ?Neurological:  ?   Mental Status: She is alert.  ?  ?Neurovascular status found to be intact muscle strength was found to be adequate range of motion adequate with patient found to have inflammation of the feet bilateral low-grade with moderate elevation of the toes bilateral and is also noted to have rigid contracture of several digits bilateral ? ?   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 ?   ?Plan:  ?H&P educated on condition reviewed x-rays discussed possibility for orthotics or other treatment for possible cortisone but at this point organ to focus on the neuropathy and I am going to start her on gabapentin with 1 pill at night and then adding on if she tolerates well.  Educated her on peripheral neuropathy and utilization of gabapentin 300 mg up to 3 times daily ? ?X-rays indicate structural deformity of the lesser digits bilateral with no indications of bony deformity except for digital hammertoe deformities of the rigid nature bilateral ?   ? ? ?

## 2021-12-02 DIAGNOSIS — H35423 Microcystoid degeneration of retina, bilateral: Secondary | ICD-10-CM | POA: Diagnosis not present

## 2021-12-02 DIAGNOSIS — H43813 Vitreous degeneration, bilateral: Secondary | ICD-10-CM | POA: Diagnosis not present

## 2021-12-02 DIAGNOSIS — H353123 Nonexudative age-related macular degeneration, left eye, advanced atrophic without subfoveal involvement: Secondary | ICD-10-CM | POA: Diagnosis not present

## 2021-12-02 DIAGNOSIS — H353114 Nonexudative age-related macular degeneration, right eye, advanced atrophic with subfoveal involvement: Secondary | ICD-10-CM | POA: Diagnosis not present

## 2021-12-04 DIAGNOSIS — M1712 Unilateral primary osteoarthritis, left knee: Secondary | ICD-10-CM | POA: Diagnosis not present

## 2021-12-14 DIAGNOSIS — M1712 Unilateral primary osteoarthritis, left knee: Secondary | ICD-10-CM | POA: Diagnosis not present

## 2021-12-16 ENCOUNTER — Encounter: Payer: Self-pay | Admitting: Cardiology

## 2021-12-23 DIAGNOSIS — H353114 Nonexudative age-related macular degeneration, right eye, advanced atrophic with subfoveal involvement: Secondary | ICD-10-CM | POA: Diagnosis not present

## 2021-12-23 DIAGNOSIS — H43813 Vitreous degeneration, bilateral: Secondary | ICD-10-CM | POA: Diagnosis not present

## 2021-12-23 DIAGNOSIS — H35423 Microcystoid degeneration of retina, bilateral: Secondary | ICD-10-CM | POA: Diagnosis not present

## 2021-12-23 DIAGNOSIS — H353123 Nonexudative age-related macular degeneration, left eye, advanced atrophic without subfoveal involvement: Secondary | ICD-10-CM | POA: Diagnosis not present

## 2021-12-28 ENCOUNTER — Other Ambulatory Visit: Payer: Self-pay | Admitting: Orthopedic Surgery

## 2021-12-28 ENCOUNTER — Telehealth: Payer: Self-pay

## 2021-12-28 NOTE — Telephone Encounter (Signed)
   Pre-operative Risk Assessment    Patient Name: Connie [name]  DOB: Nov 26, 1940 MRN: [id number]      Request for Surgical Clearance    Procedure:   Left Total Knee Arthroplasty  Date of Surgery:  Clearance 01/10/22                                 Surgeon:  Milly Jakob, MD Surgeon's Group or Practice Name:  [facility] Phone number:  [phone]  Fax number:  [phone]   Type of Clearance Requested:   - Medical    Type of Anesthesia:  Spinal   Additional requests/questions:    SignedBrunetta Genera   12/28/2021, 11:54 AM

## 2021-12-28 NOTE — Telephone Encounter (Signed)
    Name: Connie [name]  DOB: 02-21-1941  MRN: [id number]  Primary Cardiologist: Donato Heinz, MD   Preoperative team, please contact this patient and set up a phone call appointment for further preoperative risk assessment. Please obtain consent and complete medication review. Thank you for your help.  I confirm that guidance regarding antiplatelet and oral anticoagulation therapy has been completed and, if necessary, noted below.  Leanor Kail, [place] 12/28/2021, 4:47 PM [name] [address] [place], [place] [address]

## 2021-12-29 ENCOUNTER — Telehealth: Payer: Self-pay | Admitting: *Deleted

## 2021-12-29 NOTE — Telephone Encounter (Signed)
S/w the pt and she has been scheduled for a tele pre op appt 12/30/21 4 pm. Pt asked to cancel Dr. Bjorn Pippin 01/11/22 appt and move out a bit. His next available was 05/17/22. Pt is agreeable to 05/17/22 appt, though pt has been added to wait list as well.   Pt thanked me for the help and the call.   Med rec and consent are done.     Patient Consent for Virtual Visit        Connie [name] has provided verbal consent on 12/29/2021 for a virtual visit (video or telephone).   CONSENT FOR VIRTUAL VISIT FOR:  Connie [name]  By participating in this virtual visit I agree to the following:  I hereby voluntarily request, consent and authorize [facility] and its employed or contracted physicians, physician assistants, nurse practitioners or other licensed health care professionals (the Practitioner), to provide me with telemedicine health care services (the "Services") as deemed necessary by the treating Practitioner. I acknowledge and consent to receive the Services by the Practitioner via telemedicine. I understand that the telemedicine visit will involve communicating with the Practitioner through live audiovisual communication technology and the disclosure of certain medical information by electronic transmission. I acknowledge that I have been given the opportunity to request an in-person assessment or other available alternative prior to the telemedicine visit and am voluntarily participating in the telemedicine visit.  I understand that I have the right to withhold or withdraw my consent to the use of telemedicine in the course of my care at any time, without affecting my right to future care or treatment, and that the Practitioner or I may terminate the telemedicine visit at any time. I understand that I have the right to inspect all information obtained and/or recorded in the course of the telemedicine visit and may receive copies of available information for a reasonable fee.  I  understand that some of the potential risks of receiving the Services via telemedicine include:  Delay or interruption in medical evaluation due to technological equipment failure or disruption; Information transmitted may not be sufficient (e.g. poor resolution of images) to allow for appropriate medical decision making by the Practitioner; and/or  In rare instances, security protocols could fail, causing a breach of personal health information.  Furthermore, I acknowledge that it is my responsibility to provide information about my medical history, conditions and care that is complete and accurate to the best of my ability. I acknowledge that Practitioner's advice, recommendations, and/or decision may be based on factors not within their control, such as incomplete or inaccurate data provided by me or distortions of diagnostic images or specimens that may result from electronic transmissions. I understand that the practice of medicine is not an exact science and that Practitioner makes no warranties or guarantees regarding treatment outcomes. I acknowledge that a copy of this consent can be made available to me via my patient portal [facility] MyChart), or I can request a printed copy by calling the office of [facility].    I understand that my insurance will be billed for this visit.   I have read or had this consent read to me. I understand the contents of this consent, which adequately explains the benefits and risks of the Services being provided via telemedicine.  I have been provided ample opportunity to ask questions regarding this consent and the Services and have had my questions answered to my satisfaction. I give my informed consent for the services to  be provided through the use of telemedicine in my medical care

## 2021-12-29 NOTE — Telephone Encounter (Signed)
S/w the pt and she has been scheduled for a tele pre op appt 12/30/21 4 pm. Pt asked to cancel Dr. Gardiner Rhyme 01/11/22 appt and move out a bit. His next available was 05/17/22. Pt is agreeable to 05/17/22 appt, though pt has been added to wait list as well.   Pt thanked me for the help and the call.   Med rec and consent are done.

## 2021-12-30 ENCOUNTER — Ambulatory Visit (INDEPENDENT_AMBULATORY_CARE_PROVIDER_SITE_OTHER): Payer: Medicare PPO | Admitting: Nurse Practitioner

## 2021-12-30 DIAGNOSIS — Z01810 Encounter for preprocedural cardiovascular examination: Secondary | ICD-10-CM | POA: Diagnosis not present

## 2021-12-30 NOTE — Progress Notes (Signed)
Virtual Visit via Telephone Note   Because of Connie [name]'s co-morbid illnesses, she is at least at moderate risk for complications without adequate follow up.  This format is felt to be most appropriate for this patient at this time.  The patient did not have access to video technology/had technical difficulties with video requiring transitioning to audio format only (telephone).  All issues noted in this document were discussed and addressed.  No physical exam could be performed with this format.  Please refer to the patient's chart for her consent to telehealth for [facility].  Evaluation Performed:  Preoperative cardiovascular risk assessment _____________   Date:  12/30/2021   Patient ID:  Connie [name], DOB [birth year], MRN [id number] Patient Location:  Home Provider location:   Office  Primary Care Provider:  Shon Haleimberlake, Kathryn S, MD Primary Cardiologist:  Little Ishikawahristopher L Schumann, MD  Chief Complaint / Patient Profile   81 y.o. y/o female with a h/o paroxysmal atrial flutter, hypertension, hyperlipidemia, hypothyroidism, GERD, and psoriatic arthritis who is pending left total knee arthroplasty scheduled for 01/10/2022 with Dr. Milly JakobJohn Lee Graves of [facility] and presents today for telephonic preoperative cardiovascular risk assessment.  Past Medical History    Past Medical History:  Diagnosis Date   Arthritis    Depression    GERD (gastroesophageal reflux disease)    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   Multiple, noted on US   History of hiatal hernia    Hypertension    Hypothyroidism    Macular degeneration    Psoriatic arthritis (HCC)    Sinus drainage    UTI (urinary tract infection)    Past Surgical History:  Procedure Laterality Date   BACK SURGERY  2000   Dr Newell CoralNudelman   bladder tack  2005   COLONOSCOPY     DILATION AND CURETTAGE OF UTERUS     after miscarriage   RECTAL PROLAPSE REPAIR  2008   SCAR REVISION Right 2017   Knee, Dr. Luiz BlareGraves    TONSILLECTOMY     as a child   TOTAL KNEE ARTHROPLASTY Right 09/05/2014   Procedure: TOTAL KNEE ARTHROPLASTY;  Surgeon: Harvie JuniorJohn L Graves, MD;  Location: [facility];  Service: Orthopedics;  Laterality: Right;   TOTAL SHOULDER ARTHROPLASTY Left 11/17/2017   Procedure: LEFT TOTAL SHOULDER ARTHROPLASTY;  Surgeon: Jodi GeraldsGraves, John, MD;  Location: [facility];  Service: Orthopedics;  Laterality: Left;  with block   TUBAL LIGATION  1972    Allergies  Allergies  Allergen Reactions   Penicillins Rash and Other (See Comments)    Has patient had a PCN reaction causing immediate rash, facial/tongue/throat swelling, SOB or lightheadedness with hypotension: Yes Has patient had a PCN reaction causing severe rash involving mucus membranes or skin necrosis: No Has patient had a PCN reaction that required hospitalization: No Has patient had a PCN reaction occurring within the last 10 years: No If all of the above answers are "NO", then may proceed with Cephalosporin use.    Sulfa Antibiotics Rash    History of Present Illness    Connie [name] is a 81 y.o. female who presents via audio/video conferencing for a telehealth visit today.  Pt was last seen in cardiology clinic on 07/27/2021 by Dr. Bjorn PippinSchumann.  At that time Connie [name] was doing well overall from a cardiac standpoint. She reported infrequent palpitations.  She was not taking her Eliquis at the time.The patient is now pending procedure as outlined above. Since her last visit, she did well  from a cardiac standpoint.  She remains off Eliquis and states she is not interested in taking this medication at this time.  She reports infrequent palpitations. She denies chest pain, dyspnea, pnd, orthopnea, n, v, dizziness, syncope, edema, weight gain, or early satiety. All other systems reviewed and are otherwise negative except as noted above.  Overall, she reports feeling well and denies any new symptoms or concerns today.  Home Medications    Prior to Admission  medications   Medication Sig Start Date End Date Taking? Authorizing Provider  apixaban (ELIQUIS) 5 MG TABS tablet Take 1 tablet (5 mg total) by mouth 2 (two) times daily. 04/05/21   Little Ishikawa, MD  Carboxymethylcellulose Sodium (THERATEARS OP) Place 1 drop into both eyes 2 (two) times daily as needed (for dry eyes).    [provider]  carvedilol (COREG) 6.25 MG tablet Take 1 tablet (6.25 mg total) by mouth 2 (two) times daily. 07/27/21   Little Ishikawa, MD  COVID-19 mRNA bivalent vaccine, Pfizer, (PFIZER COVID-19 VAC BIVALENT) injection Inject into the muscle. Patient not taking: Reported on 12/29/2021 05/12/21   Judyann Munson, MD  COVID-19 mRNA Vac-TriS, Pfizer, (PFIZER-BIONT COVID-19 VAC-TRIS) SUSP injection Inject into the muscle. Patient not taking: Reported on 12/29/2021 12/15/20   Judyann Munson, MD  gabapentin (NEURONTIN) 300 MG capsule Take 1 capsule (300 mg total) by mouth 3 (three) times daily. Patient not taking: Reported on 12/29/2021 10/20/21   Lenn Sink, DPM  hydrochlorothiazide (HYDRODIURIL) 25 MG tablet Take 25 mg by mouth daily.    [provider]  HYDROcodone-acetaminophen (NORCO) 5-325 MG tablet Take 1 tablet by mouth every 6 (six) hours as needed. Patient not taking: Reported on 12/29/2021 11/18/17   Allena Katz, PA-C  irbesartan (AVAPRO) 150 MG tablet Take 150 mg by mouth daily.    [provider]  levothyroxine (SYNTHROID) 88 MCG tablet Take 88 mcg by mouth daily before breakfast.    [provider]  Multiple Vitamins-Minerals (PRESERVISION AREDS 2 PO) Take 1 capsule by mouth 2 (two) times daily.    [provider]  naproxen sodium (ALEVE) 220 MG tablet Take 220 mg by mouth daily as needed (pain).    [provider]  rosuvastatin (CRESTOR) 10 MG tablet Take 1 tablet (10 mg total) by mouth daily. Patient not taking: Reported on 12/29/2021 04/12/21 04/07/22  Little Ishikawa, MD  sertraline  (ZOLOFT) 100 MG tablet Take 100 mg by mouth daily.    [provider]    Physical Exam    Vital Signs:  Priya Matsen does not have vital signs available for review today.  Given telephonic nature of communication, physical exam is limited. AAOx3. NAD. Normal affect.  Speech and respirations are unlabored.  Accessory Clinical Findings    None  Assessment & Plan    1.  Preoperative Cardiovascular Risk Assessment:  According to the Revised Cardiac Risk Index (RCRI), her Perioperative Risk of Major Cardiac Event is (%): 0.4. Her Functional Capacity in METs is: 5.13 according to the Duke Activity Status Index (DASI). Therefore, based on ACC/AHA guidelines, patient would be at acceptable risk for the planned procedure without further cardiovascular testing. I the patient that if she develops any new symptoms prior to her procedure to please contact our office.  Patient reports not taking Eliquis at this time.  A copy of this note will be routed to requesting surgeon.  Time:   Today, I have spent 6 minutes with the patient  with telehealth technology discussing medical history, symptoms, and management plan.     Joylene Grapes, NP  12/30/2021, 4:19 PM

## 2021-12-31 NOTE — Care Plan (Signed)
Ortho Bundle Case Management Note  Patient Details  Name: Connie [name] MRN: [id number] Date of Birth: Sep 10, 1940  Spoke with patient prior to surgery. She will discharge to home with family. HHPT referral to [facility] and OPPT set up with [facility] [address]. Rolling walker ordered for home use. Patient and MD in agreement with plan. Choice offered                    DME Arranged:  Walker rolling DME Agency:  [employer]  HH Arranged:  PT HH Agency:  [facility]  Additional Comments: Please contact me with any questions of if this plan should need to change.  Shauna Hugh,  RN,BSN,MHA,CCM  [facility]ist  [phone] 12/31/2021, 9:36 AM

## 2021-12-31 NOTE — Progress Notes (Signed)
Sent message, via epic in basket, requesting orders in epic from surgeon.  

## 2022-01-03 DIAGNOSIS — M13862 Other specified arthritis, left knee: Secondary | ICD-10-CM | POA: Diagnosis not present

## 2022-01-03 DIAGNOSIS — M25662 Stiffness of left knee, not elsewhere classified: Secondary | ICD-10-CM | POA: Diagnosis not present

## 2022-01-03 DIAGNOSIS — M25562 Pain in left knee: Secondary | ICD-10-CM | POA: Diagnosis not present

## 2022-01-03 DIAGNOSIS — R262 Difficulty in walking, not elsewhere classified: Secondary | ICD-10-CM | POA: Diagnosis not present

## 2022-01-03 NOTE — Patient Instructions (Addendum)
DUE TO COVID-19 ONLY TWO VISITORS  (aged 84 and older)  ARE ALLOWED TO COME WITH YOU AND STAY IN THE WAITING ROOM ONLY DURING PRE OP AND PROCEDURE.   **NO VISITORS ARE ALLOWED IN THE SHORT STAY AREA OR RECOVERY ROOM!!**  IF YOU WILL BE ADMITTED INTO THE HOSPITAL YOU ARE ALLOWED ONLY FOUR SUPPORT PEOPLE DURING VISITATION HOURS ONLY (7 AM -8PM)   The support person(s) must pass our screening, gel in and out, and wear a mask at all times, including in the patient's room. Patients must also wear a mask when staff or their support person are in the room. Visitors GUEST BADGE MUST BE WORN VISIBLY  One adult visitor may remain with you overnight and MUST be in the room by 8 P.M.     Your procedure is scheduled on: 01/10/22   Report to [facility] Main Entrance    Report to admitting at : 10:00 AM   Call this number if you have problems the morning of surgery [phone]   Do not eat food :After Midnight.   After Midnight you may have the following liquids until : 9:30 AM DAY OF SURGERY  Water Black Coffee (sugar ok, NO MILK/CREAM OR CREAMERS)  Tea (sugar ok, NO MILK/CREAM OR CREAMERS) regular and decaf                             Plain Jell-O (NO RED)                                           Fruit ices (not with fruit pulp, NO RED)                                     Popsicles (NO RED)                                                                  Juice: apple, WHITE grape, WHITE cranberry Sports drinks like Gatorade (NO RED) Clear broth(vegetable,chicken,beef)   Oral Hygiene is also important to reduce your risk of infection.                                    Remember - BRUSH YOUR TEETH THE MORNING OF SURGERY WITH YOUR REGULAR TOOTHPASTE   Take these medicines the morning of surgery with A SIP OF WATER: sertraline, carvedilol, synthroid                              You may not have any metal on your body including hair pins, jewelry, and body piercing             Do not  wear make-up, lotions, powders, perfumes/cologne, or deodorant  Do not wear nail polish including gel and S&S, artificial/acrylic nails, or any other type of covering on natural nails including finger and toenails. If you have artificial nails, gel coating, etc.  that needs to be removed by a nail salon please have this removed prior to surgery or surgery may need to be canceled/ delayed if the surgeon/ anesthesia feels like they are unable to be safely monitored.   Do not shave  48 hours prior to surgery.    Do not bring valuables to the hospital. [place].   Bring small overnight bag day of surgery.   DO NOT [place]. PHARMACY WILL DISPENSE MEDICATIONS LISTED ON YOUR MEDICATION LIST TO YOU DURING YOUR ADMISSION [place]!    Patients discharged on the day of surgery will not be allowed to drive home.  Someone NEEDS to stay with you for the first 24 hours after anesthesia.   Special Instructions: Bring a copy of your healthcare power of attorney and living will documents         the day of surgery if you haven't scanned them before.              Please read over the following fact sheets you were given: IF YOU HAVE QUESTIONS ABOUT YOUR PRE-OP INSTRUCTIONS PLEASE CALL [phone]     [facility] - Preparing for Surgery Before surgery, you can play an important role.  Because skin is not sterile, your skin needs to be as free of germs as possible.  You can reduce the number of germs on your skin by washing with CHG (chlorahexidine gluconate) soap before surgery.  CHG is an antiseptic cleaner which kills germs and bonds with the skin to continue killing germs even after washing. Please DO NOT use if you have an allergy to CHG or antibacterial soaps.  If your skin becomes reddened/irritated stop using the CHG and inform your nurse when you arrive at Short Stay. Do not shave (including legs and underarms)  for at least 48 hours prior to the first CHG shower.  You may shave your face/neck. Please follow these instructions carefully:  1.  Shower with CHG Soap the night before surgery and the  morning of Surgery.  2.  If you choose to wash your hair, wash your hair first as usual with your  normal  shampoo.  3.  After you shampoo, rinse your hair and body thoroughly to remove the  shampoo.                           4.  Use CHG as you would any other liquid soap.  You can apply chg directly  to the skin and wash                       Gently with a scrungie or clean washcloth.  5.  Apply the CHG Soap to your body ONLY FROM THE NECK DOWN.   Do not use on face/ open                           Wound or open sores. Avoid contact with eyes, ears mouth and genitals (private parts).                       Wash face,  Genitals (private parts) with your normal soap.             6.  Wash  thoroughly, paying special attention to the area where your surgery  will be performed.  7.  Thoroughly rinse your body with warm water from the neck down.  8.  DO NOT shower/wash with your normal soap after using and rinsing off  the CHG Soap.                9.  Pat yourself dry with a clean towel.            10.  Wear clean pajamas.            11.  Place clean sheets on your bed the night of your first shower and do not  sleep with pets. Day of Surgery : Do not apply any lotions/deodorants the morning of surgery.  Please wear clean clothes to the hospital/surgery center.  FAILURE TO FOLLOW THESE INSTRUCTIONS MAY RESULT IN THE CANCELLATION OF YOUR SURGERY PATIENT SIGNATURE_________________________________  NURSE SIGNATURE__________________________________  ________________________________________________________________________

## 2022-01-04 ENCOUNTER — Encounter (HOSPITAL_COMMUNITY)
Admission: RE | Admit: 2022-01-04 | Discharge: 2022-01-04 | Disposition: A | Discharge status: Home / Self Care | Payer: Medicare PPO | Source: Ambulatory Visit | Attending: Orthopedic Surgery | Admitting: Orthopedic Surgery

## 2022-01-04 ENCOUNTER — Encounter (HOSPITAL_COMMUNITY): Payer: Self-pay

## 2022-01-04 VITALS — BP 141/85 | HR 66 | Temp 97.9°F | Resp 14 | Ht 61.0 in | Wt 162.0 lb

## 2022-01-04 DIAGNOSIS — Z01818 Encounter for other preprocedural examination: Secondary | ICD-10-CM | POA: Diagnosis not present

## 2022-01-04 DIAGNOSIS — I10 Essential (primary) hypertension: Secondary | ICD-10-CM | POA: Insufficient documentation

## 2022-01-04 DIAGNOSIS — I4892 Unspecified atrial flutter: Secondary | ICD-10-CM | POA: Insufficient documentation

## 2022-01-04 DIAGNOSIS — E039 Hypothyroidism, unspecified: Secondary | ICD-10-CM | POA: Diagnosis not present

## 2022-01-04 DIAGNOSIS — I2510 Atherosclerotic heart disease of native coronary artery without angina pectoris: Secondary | ICD-10-CM | POA: Diagnosis not present

## 2022-01-04 DIAGNOSIS — M1712 Unilateral primary osteoarthritis, left knee: Secondary | ICD-10-CM | POA: Diagnosis not present

## 2022-01-04 HISTORY — DX: Cardiac arrhythmia, unspecified: I49.9

## 2022-01-04 LAB — CBC
HCT: 39.2 % (ref 36.0–46.0)
Hemoglobin: 12.7 g/dL (ref 12.0–15.0)
MCH: 28.5 pg (ref 26.0–34.0)
MCHC: 32.4 g/dL (ref 30.0–36.0)
MCV: 87.9 fL (ref 80.0–100.0)
Platelets: 375 10*3/uL (ref 150–400)
RBC: 4.46 MIL/uL (ref 3.87–5.11)
RDW: 13.3 % (ref 11.5–15.5)
WBC: 10.7 10*3/uL — ABNORMAL HIGH (ref 4.0–10.5)
nRBC: 0 % (ref 0.0–0.2)

## 2022-01-04 LAB — SURGICAL PCR SCREEN
MRSA, PCR: NEGATIVE
Staphylococcus aureus: NEGATIVE

## 2022-01-04 LAB — BASIC METABOLIC PANEL
Anion gap: 9 (ref 5–15)
BUN: 25 mg/dL — ABNORMAL HIGH (ref 8–23)
CO2: 27 mmol/L (ref 22–32)
Calcium: 9.7 mg/dL (ref 8.9–10.3)
Chloride: 98 mmol/L (ref 98–111)
Creatinine, Ser: 0.87 mg/dL (ref 0.44–1.00)
GFR, Estimated: >60 mL/min (ref >60)
Glucose, Bld: 96 mg/dL (ref 70–99)
Potassium: 5 mmol/L (ref 3.5–5.1)
Sodium: 134 mmol/L — ABNORMAL LOW (ref 135–145)

## 2022-01-04 NOTE — Progress Notes (Addendum)
COVID Vaccine Completed: yes x4  Date of COVID positive in last 90 days: no  PCP - Garth Bigness, MD Cardiologist - Epifanio Lesches, MD  Cardiac clearance by Bernadene Person 12/30/21 Epic  Chest x-ray - n/a EKG - 01/04/22 Epic/chart Stress Test - n/a ECHO - 04/07/21 Epic Cardiac Cath - n/a Pacemaker/ICD device last checked: n/a Spinal Cord Stimulator: n/a  Bowel Prep - no  Sleep Study - no per pt CPAP -   Fasting Blood Sugar - n/a Checks Blood Sugar _____ times a day  Blood Thinner Instructions: Eliquis, never took per pt Aspirin Instructions: Last Dose:  Activity level: Can go up a flight of stairs and perform activities of daily living without stopping and without symptoms of chest pain or shortness of breath.    Anesthesia review: a fib, HTN, left fascicular block  Patient denies shortness of breath, fever, cough and chest pain at PAT appointment  Patient verbalized understanding of instructions that were given to them at the PAT appointment. Patient was also instructed that they will need to review over the PAT instructions again at home before surgery.

## 2022-01-05 NOTE — Anesthesia Preprocedure Evaluation (Addendum)
Anesthesia Evaluation  Patient identified by MRN, date of birth, ID band Patient awake    Reviewed: Allergy & Precautions, NPO status , Patient's Chart, lab work & pertinent test results  Airway Mallampati: II  TM Distance: >3 FB Neck ROM: Full    Dental no notable dental hx.    Pulmonary neg pulmonary ROS,    Pulmonary exam normal        Cardiovascular hypertension, Pt. on medications and Pt. on home beta blockers + dysrhythmias (NOT on eliquis ) Atrial Fibrillation  Rhythm:Regular Rate:Normal     Neuro/Psych Depression negative neurological ROS     GI/Hepatic Neg liver ROS, hiatal hernia, GERD  ,  Endo/Other  Hypothyroidism   Renal/GU negative Renal ROS  negative genitourinary   Musculoskeletal  (+) Arthritis , Osteoarthritis,    Abdominal Normal abdominal exam  (+)   Peds  Hematology negative hematology ROS (+)   Anesthesia Other Findings   Reproductive/Obstetrics                           Anesthesia Physical Anesthesia Plan  ASA: 3  Anesthesia Plan: MAC, Regional and Spinal   Post-op Pain Management: Regional block*   Induction: Intravenous  PONV Risk Score and Plan: 2 and Ondansetron, Dexamethasone, Propofol infusion and Treatment may vary due to age or medical condition  Airway Management Planned: Simple Face Mask, Natural Airway and Nasal Cannula  Additional Equipment: None  Intra-op Plan:   Post-operative Plan:   Informed Consent: I have reviewed the patients History and Physical, chart, labs and discussed the procedure including the risks, benefits and alternatives for the proposed anesthesia with the patient or authorized representative who has indicated his/her understanding and acceptance.     Dental advisory given  Plan Discussed with: CRNA  Anesthesia Plan Comments: (See PAT note 01/04/2022 Lab Results      Component                Value               Date                       WBC                      10.7 (H)            01/04/2022                HGB                      12.7                01/04/2022                HCT                      39.2                01/04/2022                MCV                      87.9                01/04/2022                PLT  375                 01/04/2022           Lab Results      Component                Value               Date                      NA                       134 (L)             01/04/2022                K                        5.0                 01/04/2022                CO2                      27                  01/04/2022                GLUCOSE                  96                  01/04/2022                BUN                      25 (H)              01/04/2022                CREATININE               0.87                01/04/2022                CALCIUM                  9.7                 01/04/2022                EGFR                     67                  04/05/2021                GFRNONAA                 >60                 01/04/2022           Myocardial Perfusion 08/10/2021 . The study is normal. The study is low risk. . No ST deviation was noted. . LV perfusion is normal. There is no evidence of ischemia. There is no evidence of infarction. . Left ventricular function is normal. Nuclear  stress EF: 79 %. The left ventricular ejection fraction is hyperdynamic (>65%). End diastolic cavity size is normal. End systolic cavity size is normal.  Echo 04/07/2021 1. Left ventricular ejection fraction, by estimation, is 55 to 60%. The  left ventricle has normal function. The left ventricle has no regional  wall motion abnormalities. Left ventricular diastolic parameters were  normal.  2. Right ventricular systolic function is normal. The right ventricular  size is normal. There is normal pulmonary artery systolic pressure.  3. The mitral valve is normal in  structure. Mild mitral valve  regurgitation.  4. The aortic valve is tricuspid. Aortic valve regurgitation is not  visualized.)      Anesthesia Quick Evaluation

## 2022-01-05 NOTE — Progress Notes (Signed)
Anesthesia Chart Review   Case: [id number] Date/Time: 01/10/22 1215   Procedure: TOTAL KNEE ARTHROPLASTY (Left: Knee)   Anesthesia type: Spinal   Pre-op diagnosis: LEFT KNEE OSTEOARTHRITIS   Location[facility] ROOM 07 / [facility]   Surgeons: Jodi Geralds, MD       DISCUSSION:81 y.o. never smoker with h/o HTN, hypothyroidism, paroxysmal atrial flutter, left knee OA scheduled for above procedure 01/10/2022 with Dr. Jodi Geralds.   Pt last seen by cardiology 12/30/2021. Per OV note, "According to the Revised Cardiac Risk Index (RCRI), her Perioperative Risk of Major Cardiac Event is (%): 0.4. Her Functional Capacity in METs is: 5.13 according to the Duke Activity Status Index (DASI). Therefore, based on ACC/AHA guidelines, patient would be at acceptable risk for the planned procedure without further cardiovascular testing. I the patient that if she develops any new symptoms prior to her procedure to please contact our office."    She is not taking Eliquis at this time.   Anticipate pt can proceed with planned procedure barring acute status change.   VS: BP (!) 141/85   Pulse 66   Temp 36.6 C (Oral)   Resp 14   Ht 5\' 1"  (1.549 m)   Wt 73.5 kg   SpO2 98%   BMI 30.61 kg/m   PROVIDERS: , MD is PCP   Primary Cardiologist:  Shon Hale, MD LABS: Labs reviewed: Acceptable for surgery. (all labs ordered are listed, but only abnormal results are displayed)  Labs Reviewed  BASIC METABOLIC PANEL - Abnormal; Notable for the following components:      Result Value   Sodium 134 (*)    BUN 25 (*)    All other components within normal limits  CBC - Abnormal; Notable for the following components:   WBC 10.7 (*)    All other components within normal limits  SURGICAL PCR SCREEN     IMAGES:   EKG: Rate 61 bpm  Normal sinus rhythm Incomplete right bundle branch block Left anterior fascicular block Anterolateral infarct , age undetermined Abnormal ECG No  significant change since last tracing  CV: Myocardial Perfusion 08/10/2021   The study is normal. The study is low risk.   No ST deviation was noted.   LV perfusion is normal. There is no evidence of ischemia. There is no evidence of infarction.   Left ventricular function is normal. Nuclear stress EF: 79 %. The left ventricular ejection fraction is hyperdynamic (>65%). End diastolic cavity size is normal. End systolic cavity size is normal.   Echo  04/07/2021 1. Left ventricular ejection fraction, by estimation, is 55 to 60%. The  left ventricle has normal function. The left ventricle has no regional  wall motion abnormalities. Left ventricular diastolic parameters were  normal.   2. Right ventricular systolic function is normal. The right ventricular  size is normal. There is normal pulmonary artery systolic pressure.   3. The mitral valve is normal in structure. Mild mitral valve  regurgitation.   4. The aortic valve is tricuspid. Aortic valve regurgitation is not  visualized. 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   Multiple, noted on 12/27/2016   History of hiatal hernia    Hypertension    Hypothyroidism    Macular degeneration    Psoriatic arthritis (HCC)    Sinus drainage    UTI (urinary tract  infection)     Past Surgical History:  Procedure Laterality Date   BACK SURGERY  2000   Dr Newell Coral   bladder tack  2005   COLONOSCOPY     DILATION AND CURETTAGE OF UTERUS     after miscarriage   RECTAL PROLAPSE REPAIR  2008   SCAR REVISION Right 2017   Knee, Dr. Luiz Blare   TONSILLECTOMY     as a child   TOTAL KNEE ARTHROPLASTY Right 09/05/2014   Procedure: TOTAL KNEE ARTHROPLASTY;  Surgeon: Harvie Junior, MD;  Location: [facility] OR;  Service: Orthopedics;  Laterality: Right;   TOTAL SHOULDER ARTHROPLASTY Left 11/17/2017   Procedure: LEFT TOTAL SHOULDER ARTHROPLASTY;  Surgeon:  Jodi Geralds, MD;  Location: [facility];  Service: Orthopedics;  Laterality: Left;  with block   TUBAL LIGATION  1972    MEDICATIONS:  apixaban (ELIQUIS) 5 MG TABS tablet   Carboxymethylcellulose Sodium (THERATEARS OP)   carvedilol (COREG) 6.25 MG tablet   COVID-19 mRNA bivalent vaccine, Pfizer, (PFIZER COVID-19 VAC BIVALENT) injection   COVID-19 mRNA Vac-TriS, Pfizer, (PFIZER-BIONT COVID-19 VAC-TRIS) SUSP injection   gabapentin (NEURONTIN) 300 MG capsule   hydrochlorothiazide (HYDRODIURIL) 25 MG tablet   HYDROcodone-acetaminophen (NORCO) 5-325 MG tablet   irbesartan (AVAPRO) 150 MG tablet   levothyroxine (SYNTHROID) 88 MCG tablet   Multiple Vitamins-Minerals (PRESERVISION AREDS 2 PO)   naproxen sodium (ALEVE) 220 MG tablet   rosuvastatin (CRESTOR) 10 MG tablet   sertraline (ZOLOFT) 100 MG tablet   No current facility-administered medications for this encounter.     Jodell Cipro Ward, PA-C [facility]-Surgical Testing [phone]

## 2022-01-10 ENCOUNTER — Other Ambulatory Visit: Payer: Self-pay

## 2022-01-10 ENCOUNTER — Ambulatory Visit (HOSPITAL_BASED_OUTPATIENT_CLINIC_OR_DEPARTMENT_OTHER): Payer: Medicare PPO | Admitting: Certified Registered Nurse Anesthetist

## 2022-01-10 ENCOUNTER — Observation Stay (HOSPITAL_COMMUNITY)
Admission: RE | Admit: 2022-01-10 | Discharge: 2022-01-12 | Disposition: A | Discharge status: Home Health | Payer: Medicare PPO | Source: Ambulatory Visit | Attending: Orthopedic Surgery | Admitting: Orthopedic Surgery

## 2022-01-10 ENCOUNTER — Encounter (HOSPITAL_COMMUNITY)
Admission: RE | Disposition: A | Discharge status: Home Health | Payer: Self-pay | Source: Ambulatory Visit | Attending: Orthopedic Surgery

## 2022-01-10 ENCOUNTER — Encounter (HOSPITAL_COMMUNITY): Payer: Self-pay | Admitting: Orthopedic Surgery

## 2022-01-10 ENCOUNTER — Ambulatory Visit (HOSPITAL_COMMUNITY): Payer: Medicare PPO | Admitting: Physician Assistant

## 2022-01-10 DIAGNOSIS — I4891 Unspecified atrial fibrillation: Secondary | ICD-10-CM | POA: Diagnosis not present

## 2022-01-10 DIAGNOSIS — E039 Hypothyroidism, unspecified: Secondary | ICD-10-CM | POA: Insufficient documentation

## 2022-01-10 DIAGNOSIS — M1712 Unilateral primary osteoarthritis, left knee: Secondary | ICD-10-CM | POA: Diagnosis not present

## 2022-01-10 DIAGNOSIS — G8918 Other acute postprocedural pain: Secondary | ICD-10-CM | POA: Diagnosis not present

## 2022-01-10 DIAGNOSIS — I10 Essential (primary) hypertension: Secondary | ICD-10-CM | POA: Diagnosis not present

## 2022-01-10 DIAGNOSIS — M1711 Unilateral primary osteoarthritis, right knee: Secondary | ICD-10-CM

## 2022-01-10 DIAGNOSIS — Z96652 Presence of left artificial knee joint: Secondary | ICD-10-CM | POA: Diagnosis not present

## 2022-01-10 DIAGNOSIS — Z96651 Presence of right artificial knee joint: Secondary | ICD-10-CM | POA: Diagnosis not present

## 2022-01-10 DIAGNOSIS — Z96612 Presence of left artificial shoulder joint: Secondary | ICD-10-CM | POA: Insufficient documentation

## 2022-01-10 HISTORY — PX: TOTAL KNEE ARTHROPLASTY: SHX125

## 2022-01-10 SURGERY — ARTHROPLASTY, KNEE, TOTAL
Anesthesia: Monitor Anesthesia Care | Site: Knee | Laterality: Left

## 2022-01-10 MED ORDER — SODIUM CHLORIDE (PF) 0.9 % IJ SOLN
INTRAMUSCULAR | Status: AC
  Filled 2022-01-10: qty 50

## 2022-01-10 MED ORDER — DIPHENHYDRAMINE HCL 12.5 MG/5ML PO ELIX: 12.5000 mg | ORAL_SOLUTION | ORAL | Status: DC | PRN

## 2022-01-10 MED ORDER — SODIUM CHLORIDE (PF) 0.9 % IJ SOLN
INTRAMUSCULAR | Status: DC | PRN
  Administered 2022-01-10: 50 mL

## 2022-01-10 MED ORDER — OXYCODONE HCL 5 MG PO TABS
ORAL_TABLET | ORAL | Status: AC
  Filled 2022-01-10: qty 2

## 2022-01-10 MED ORDER — FENTANYL CITRATE PF 50 MCG/ML IJ SOSY: 25.0000 ug | PREFILLED_SYRINGE | INTRAMUSCULAR | Status: DC | PRN

## 2022-01-10 MED ORDER — CEFAZOLIN SODIUM-DEXTROSE 2-4 GM/100ML-% IV SOLN
INTRAVENOUS | Status: AC
  Filled 2022-01-10: qty 100

## 2022-01-10 MED ORDER — ACETAMINOPHEN 10 MG/ML IV SOLN: 1000.0000 mg | Freq: Once | INTRAVENOUS | Status: DC | PRN

## 2022-01-10 MED ORDER — ONDANSETRON HCL 4 MG/2ML IJ SOLN
4.0000 mg | Freq: Four times a day (QID) | INTRAMUSCULAR | Status: DC | PRN
  Administered 2022-01-11: 4 mg via INTRAVENOUS
  Filled 2022-01-10: qty 2

## 2022-01-10 MED ORDER — ASPIRIN 325 MG PO TBEC: 325.0000 mg | DELAYED_RELEASE_TABLET | Freq: Two times a day (BID) | ORAL | 0 refills | Status: DC

## 2022-01-10 MED ORDER — ORAL CARE MOUTH RINSE: 15.0000 mL | Freq: Once | OROMUCOSAL | Status: AC

## 2022-01-10 MED ORDER — PHENYLEPHRINE 80 MCG/ML (10ML) SYRINGE FOR IV PUSH (FOR BLOOD PRESSURE SUPPORT)
PREFILLED_SYRINGE | INTRAVENOUS | Status: DC | PRN
  Administered 2022-01-10: 80 ug via INTRAVENOUS

## 2022-01-10 MED ORDER — DEXAMETHASONE SODIUM PHOSPHATE 10 MG/ML IJ SOLN
10.0000 mg | Freq: Once | INTRAMUSCULAR | Status: AC
  Administered 2022-01-11: 10 mg via INTRAVENOUS
  Filled 2022-01-10: qty 1

## 2022-01-10 MED ORDER — BUPIVACAINE LIPOSOME 1.3 % IJ SUSP
INTRAMUSCULAR | Status: AC
  Filled 2022-01-10: qty 20

## 2022-01-10 MED ORDER — HYDROCHLOROTHIAZIDE 25 MG PO TABS
25.0000 mg | ORAL_TABLET | Freq: Every day | ORAL | Status: DC
  Administered 2022-01-10 – 2022-01-12 (×3): 25 mg via ORAL
  Filled 2022-01-10 (×3): qty 1

## 2022-01-10 MED ORDER — TRANEXAMIC ACID-NACL 1000-0.7 MG/100ML-% IV SOLN
INTRAVENOUS | Status: DC | PRN
  Administered 2022-01-10: 1000 mg via INTRAVENOUS

## 2022-01-10 MED ORDER — ASPIRIN 325 MG PO TABS
325.0000 mg | ORAL_TABLET | Freq: Two times a day (BID) | ORAL | Status: DC
Start: 2022-01-11 — End: 2022-01-12
  Administered 2022-01-11 – 2022-01-12 (×3): 325 mg via ORAL
  Filled 2022-01-10 (×3): qty 1

## 2022-01-10 MED ORDER — POLYVINYL ALCOHOL 1.4 % OP SOLN: 1.0000 [drp] | OPHTHALMIC | Status: DC | PRN

## 2022-01-10 MED ORDER — OXYCODONE-ACETAMINOPHEN 5-325 MG PO TABS: 1.0000 | ORAL_TABLET | ORAL | 0 refills | Status: DC | PRN

## 2022-01-10 MED ORDER — TRANEXAMIC ACID-NACL 1000-0.7 MG/100ML-% IV SOLN
INTRAVENOUS | Status: AC
  Filled 2022-01-10: qty 100

## 2022-01-10 MED ORDER — FENTANYL CITRATE PF 50 MCG/ML IJ SOSY: 50.0000 ug | PREFILLED_SYRINGE | Freq: Once | INTRAMUSCULAR | Status: AC

## 2022-01-10 MED ORDER — ONDANSETRON HCL 4 MG/2ML IJ SOLN
INTRAMUSCULAR | Status: DC | PRN
  Administered 2022-01-10: 4 mg via INTRAVENOUS

## 2022-01-10 MED ORDER — SODIUM CHLORIDE 0.9 % IR SOLN
Status: DC | PRN
  Administered 2022-01-10: 1000 mL

## 2022-01-10 MED ORDER — LEVOTHYROXINE SODIUM 88 MCG PO TABS
88.0000 ug | ORAL_TABLET | Freq: Every day | ORAL | Status: DC
  Administered 2022-01-11 – 2022-01-12 (×2): 88 ug via ORAL
  Filled 2022-01-10 (×2): qty 1

## 2022-01-10 MED ORDER — KCL IN DEXTROSE-NACL 20-5-0.45 MEQ/L-%-% IV SOLN
INTRAVENOUS | Status: DC
  Filled 2022-01-10 (×4): qty 1000

## 2022-01-10 MED ORDER — PROPOFOL 500 MG/50ML IV EMUL
INTRAVENOUS | Status: DC | PRN
  Administered 2022-01-10: 100 ug/kg/min via INTRAVENOUS

## 2022-01-10 MED ORDER — PHENOL 1.4 % MT LIQD: 1.0000 | OROMUCOSAL | Status: DC | PRN

## 2022-01-10 MED ORDER — CEFAZOLIN SODIUM-DEXTROSE 2-3 GM-%(50ML) IV SOLR
INTRAVENOUS | Status: DC | PRN
  Administered 2022-01-10: 2 g via INTRAVENOUS

## 2022-01-10 MED ORDER — FLEET ENEMA 7-19 GM/118ML RE ENEM: 1.0000 | ENEMA | Freq: Once | RECTAL | Status: DC | PRN

## 2022-01-10 MED ORDER — MENTHOL 3 MG MT LOZG: 1.0000 | LOZENGE | OROMUCOSAL | Status: DC | PRN

## 2022-01-10 MED ORDER — CARBOXYMETHYLCELLULOSE SODIUM 0.25 % OP SOLN
1.0000 [drp] | Freq: Two times a day (BID) | OPHTHALMIC | Status: DC | PRN
Start: 2022-01-10 — End: 2022-01-10

## 2022-01-10 MED ORDER — IRBESARTAN 150 MG PO TABS
150.0000 mg | ORAL_TABLET | Freq: Every day | ORAL | Status: DC
  Administered 2022-01-11 – 2022-01-12 (×2): 150 mg via ORAL
  Filled 2022-01-10 (×2): qty 1

## 2022-01-10 MED ORDER — HYDROMORPHONE HCL 1 MG/ML IJ SOLN
0.5000 mg | INTRAMUSCULAR | Status: DC | PRN
  Administered 2022-01-11: 1 mg via INTRAVENOUS
  Filled 2022-01-10: qty 1

## 2022-01-10 MED ORDER — PROPOFOL 10 MG/ML IV BOLUS
INTRAVENOUS | Status: DC | PRN
  Administered 2022-01-10 (×2): 20 mg via INTRAVENOUS

## 2022-01-10 MED ORDER — TRANEXAMIC ACID-NACL 1000-0.7 MG/100ML-% IV SOLN
1000.0000 mg | Freq: Once | INTRAVENOUS | Status: AC
  Administered 2022-01-10: 1000 mg via INTRAVENOUS
  Filled 2022-01-10: qty 100

## 2022-01-10 MED ORDER — MIDAZOLAM HCL 2 MG/2ML IJ SOLN
INTRAMUSCULAR | Status: AC
  Filled 2022-01-10: qty 2

## 2022-01-10 MED ORDER — DEXAMETHASONE SODIUM PHOSPHATE 10 MG/ML IJ SOLN
INTRAMUSCULAR | Status: DC | PRN
  Administered 2022-01-10: 5 mg via INTRAVENOUS

## 2022-01-10 MED ORDER — OXYCODONE HCL 5 MG PO TABS
5.0000 mg | ORAL_TABLET | ORAL | Status: DC | PRN
  Administered 2022-01-10 – 2022-01-12 (×3): 10 mg via ORAL
  Filled 2022-01-10: qty 2

## 2022-01-10 MED ORDER — BUPIVACAINE-EPINEPHRINE (PF) 0.25% -1:200000 IJ SOLN
INTRAMUSCULAR | Status: AC
  Filled 2022-01-10: qty 30

## 2022-01-10 MED ORDER — DOCUSATE SODIUM 100 MG PO CAPS
100.0000 mg | ORAL_CAPSULE | Freq: Two times a day (BID) | ORAL | Status: DC
  Administered 2022-01-10 – 2022-01-12 (×4): 100 mg via ORAL
  Filled 2022-01-10 (×4): qty 1

## 2022-01-10 MED ORDER — TIZANIDINE HCL 2 MG PO TABS: 2.0000 mg | ORAL_TABLET | Freq: Four times a day (QID) | ORAL | 0 refills | Status: DC | PRN

## 2022-01-10 MED ORDER — ALUM & MAG HYDROXIDE-SIMETH 200-200-20 MG/5ML PO SUSP: 30.0000 mL | ORAL | Status: DC | PRN

## 2022-01-10 MED ORDER — PHENYLEPHRINE HCL-NACL 20-0.9 MG/250ML-% IV SOLN
INTRAVENOUS | Status: DC | PRN
  Administered 2022-01-10: 30 ug/min via INTRAVENOUS

## 2022-01-10 MED ORDER — METHOCARBAMOL 500 MG PO TABS
500.0000 mg | ORAL_TABLET | Freq: Four times a day (QID) | ORAL | Status: DC | PRN
  Administered 2022-01-10 – 2022-01-11 (×2): 500 mg via ORAL
  Filled 2022-01-10 (×2): qty 1

## 2022-01-10 MED ORDER — PHENYLEPHRINE 80 MCG/ML (10ML) SYRINGE FOR IV PUSH (FOR BLOOD PRESSURE SUPPORT)
PREFILLED_SYRINGE | INTRAVENOUS | Status: AC
  Filled 2022-01-10: qty 10

## 2022-01-10 MED ORDER — METOCLOPRAMIDE HCL 5 MG/ML IJ SOLN
5.0000 mg | Freq: Three times a day (TID) | INTRAMUSCULAR | Status: DC | PRN
  Administered 2022-01-10: 10 mg via INTRAVENOUS
  Filled 2022-01-10: qty 2

## 2022-01-10 MED ORDER — BUPIVACAINE-EPINEPHRINE 0.5% -1:200000 IJ SOLN
INTRAMUSCULAR | Status: DC | PRN
  Administered 2022-01-10: 30 mL

## 2022-01-10 MED ORDER — PANTOPRAZOLE SODIUM 40 MG PO TBEC
40.0000 mg | DELAYED_RELEASE_TABLET | Freq: Every day | ORAL | Status: DC
  Administered 2022-01-10 – 2022-01-12 (×3): 40 mg via ORAL
  Filled 2022-01-10 (×3): qty 1

## 2022-01-10 MED ORDER — ACETAMINOPHEN 325 MG PO TABS: 325.0000 mg | ORAL_TABLET | Freq: Four times a day (QID) | ORAL | Status: DC | PRN

## 2022-01-10 MED ORDER — METOCLOPRAMIDE HCL 5 MG PO TABS: 5.0000 mg | ORAL_TABLET | Freq: Three times a day (TID) | ORAL | Status: DC | PRN

## 2022-01-10 MED ORDER — FENTANYL CITRATE PF 50 MCG/ML IJ SOSY
PREFILLED_SYRINGE | INTRAMUSCULAR | Status: AC
  Administered 2022-01-10: 50 ug via INTRAVENOUS
  Filled 2022-01-10: qty 2

## 2022-01-10 MED ORDER — CHLORHEXIDINE GLUCONATE 0.12 % MT SOLN
15.0000 mL | Freq: Once | OROMUCOSAL | Status: AC
  Administered 2022-01-10: 15 mL via OROMUCOSAL

## 2022-01-10 MED ORDER — ONDANSETRON HCL 4 MG PO TABS: 4.0000 mg | ORAL_TABLET | Freq: Four times a day (QID) | ORAL | Status: DC | PRN

## 2022-01-10 MED ORDER — 0.9 % SODIUM CHLORIDE (POUR BTL) OPTIME
TOPICAL | Status: DC | PRN
  Administered 2022-01-10: 1000 mL

## 2022-01-10 MED ORDER — WATER FOR IRRIGATION, STERILE IR SOLN
Status: DC | PRN
  Administered 2022-01-10: 2000 mL

## 2022-01-10 MED ORDER — LACTATED RINGERS IV SOLN: INTRAVENOUS | Status: DC

## 2022-01-10 MED ORDER — CARVEDILOL 6.25 MG PO TABS
6.2500 mg | ORAL_TABLET | Freq: Two times a day (BID) | ORAL | Status: DC
  Administered 2022-01-10 – 2022-01-12 (×4): 6.25 mg via ORAL
  Filled 2022-01-10 (×4): qty 1

## 2022-01-10 MED ORDER — OXYCODONE HCL 5 MG PO TABS
10.0000 mg | ORAL_TABLET | ORAL | Status: DC | PRN
  Administered 2022-01-10: 10 mg via ORAL
  Filled 2022-01-10 (×3): qty 2

## 2022-01-10 MED ORDER — BUPIVACAINE IN DEXTROSE 0.75-8.25 % IT SOLN
INTRATHECAL | Status: DC | PRN
  Administered 2022-01-10: 1.6 mL via INTRATHECAL

## 2022-01-10 MED ORDER — PROPOFOL 1000 MG/100ML IV EMUL
INTRAVENOUS | Status: AC
  Filled 2022-01-10: qty 100

## 2022-01-10 MED ORDER — SERTRALINE HCL 100 MG PO TABS
100.0000 mg | ORAL_TABLET | Freq: Every day | ORAL | Status: DC
Start: 2022-01-11 — End: 2022-01-12
  Administered 2022-01-11 – 2022-01-12 (×2): 100 mg via ORAL
  Filled 2022-01-10 (×2): qty 1

## 2022-01-10 MED ORDER — ROPIVACAINE HCL 7.5 MG/ML IJ SOLN
INTRAMUSCULAR | Status: DC | PRN
  Administered 2022-01-10: 20 mL via PERINEURAL

## 2022-01-10 MED ORDER — METHOCARBAMOL 1000 MG/10ML IJ SOLN: 500.0000 mg | Freq: Four times a day (QID) | INTRAVENOUS | Status: DC | PRN

## 2022-01-10 MED ORDER — BISACODYL 5 MG PO TBEC: 5.0000 mg | DELAYED_RELEASE_TABLET | Freq: Every day | ORAL | Status: DC | PRN

## 2022-01-10 MED ORDER — ACETAMINOPHEN 500 MG PO TABS
1000.0000 mg | ORAL_TABLET | Freq: Four times a day (QID) | ORAL | Status: AC
  Administered 2022-01-10 – 2022-01-11 (×2): 1000 mg via ORAL
  Filled 2022-01-10 (×3): qty 2

## 2022-01-10 MED ORDER — POLYETHYLENE GLYCOL 3350 17 G PO PACK: 17.0000 g | PACK | Freq: Every day | ORAL | Status: DC | PRN

## 2022-01-10 MED ORDER — BUPIVACAINE LIPOSOME 1.3 % IJ SUSP
INTRAMUSCULAR | Status: DC | PRN
  Administered 2022-01-10: 20 mL

## 2022-01-10 SURGICAL SUPPLY — 54 items
APL SKNCLS STERI-STRIP NONHPOA (GAUZE/BANDAGES/DRESSINGS) ×1
ATTUNE PSFEM LTSZ5 NARCEM KNEE (Femur) ×1 IMPLANT
BAG COUNTER SPONGE SURGICOUNT (BAG) ×1 IMPLANT
BAG SPEC THK2 15X12 ZIP CLS (MISCELLANEOUS) ×1
BAG SPNG CNTER NS LX DISP (BAG) ×1
BAG ZIPLOCK 12X15 (MISCELLANEOUS) ×2 IMPLANT
BASEPLATE TIBIAL ROTATING SZ 4 (Knees) ×1 IMPLANT
BENZOIN TINCTURE PRP APPL 2/3 (GAUZE/BANDAGES/DRESSINGS) ×2 IMPLANT
BLADE SAGITTAL 25.0X1.19X90 (BLADE) ×2 IMPLANT
BLADE SAW SGTL 13.0X1.19X90.0M (BLADE) ×2 IMPLANT
BLADE SURG SZ10 CARB STEEL (BLADE) ×4 IMPLANT
BNDG ELASTIC 6X5.8 VLCR STR LF (GAUZE/BANDAGES/DRESSINGS) ×2 IMPLANT
BOOTIES KNEE HIGH SLOAN (MISCELLANEOUS) ×1 IMPLANT
BOWL SMART MIX CTS (DISPOSABLE) ×2 IMPLANT
BSPLAT TIB 4 CMNT ROT PLAT STR (Knees) ×1 IMPLANT
CEMENT HV SMART SET (Cement) ×4 IMPLANT
COVER SURGICAL LIGHT HANDLE (MISCELLANEOUS) ×2 IMPLANT
CUFF TOURN SGL QUICK 34 (TOURNIQUET CUFF) ×2
CUFF TRNQT CYL 34X4.125X (TOURNIQUET CUFF) ×1 IMPLANT
DRAPE INCISE IOBAN 66X45 STRL (DRAPES) ×2 IMPLANT
DRAPE U-SHAPE 47X51 STRL (DRAPES) ×2 IMPLANT
DRSG AQUACEL AG ADV 3.5X10 (GAUZE/BANDAGES/DRESSINGS) ×2 IMPLANT
DURAPREP 26ML APPLICATOR (WOUND CARE) ×2 IMPLANT
ELECT REM PT RETURN 15FT ADLT (MISCELLANEOUS) ×2 IMPLANT
GLOVE BIOGEL PI IND STRL 8 (GLOVE) ×2 IMPLANT
GLOVE BIOGEL PI IND STRL 9 (GLOVE) IMPLANT
GLOVE BIOGEL PI INDICATOR 8 (GLOVE) ×2
GLOVE BIOGEL PI INDICATOR 9 (GLOVE) ×1
GLOVE ECLIPSE 7.5 STRL STRAW (GLOVE) ×4 IMPLANT
GOWN STRL REUS W/ TWL XL LVL3 (GOWN DISPOSABLE) ×2 IMPLANT
GOWN STRL REUS W/TWL XL LVL3 (GOWN DISPOSABLE) ×4
HANDPIECE INTERPULSE COAX TIP (DISPOSABLE) ×2
HOLDER FOLEY CATH W/STRAP (MISCELLANEOUS) ×1 IMPLANT
HOOD PEEL AWAY FLYTE STAYCOOL (MISCELLANEOUS) ×6 IMPLANT
INSERT TIBIAL KNEE SZ 5 12MM (Insert) IMPLANT
KIT TURNOVER KIT A (KITS) ×1 IMPLANT
MANIFOLD NEPTUNE II (INSTRUMENTS) ×2 IMPLANT
NEEDLE HYPO 22GX1.5 SAFETY (NEEDLE) ×4 IMPLANT
NS IRRIG 1000ML POUR BTL (IV SOLUTION) ×2 IMPLANT
PACK TOTAL KNEE CUSTOM (KITS) ×2 IMPLANT
PADDING CAST COTTON 6X4 STRL (CAST SUPPLIES) ×2 IMPLANT
PATELLA MEDIAL ATTUN 35MM KNEE (Knees) ×1 IMPLANT
PROTECTOR NERVE ULNAR (MISCELLANEOUS) ×2 IMPLANT
SET HNDPC FAN SPRY TIP SCT (DISPOSABLE) ×1 IMPLANT
SPIKE FLUID TRANSFER (MISCELLANEOUS) ×4 IMPLANT
STRIP CLOSURE SKIN 1/2X4 (GAUZE/BANDAGES/DRESSINGS) ×1 IMPLANT
SUT MNCRL AB 3-0 PS2 18 (SUTURE) ×2 IMPLANT
SUT VIC AB 0 CT1 36 (SUTURE) ×2 IMPLANT
SUT VIC AB 1 CT1 36 (SUTURE) ×4 IMPLANT
SYR CONTROL 10ML LL (SYRINGE) ×4 IMPLANT
TIBIAL INSERT KNEE SZ 5 12MM (Insert) ×2 IMPLANT
TRAY FOLEY MTR SLVR 16FR STAT (SET/KITS/TRAYS/PACK) ×2 IMPLANT
WATER STERILE IRR 1000ML POUR (IV SOLUTION) ×4 IMPLANT
WRAP KNEE MAXI GEL POST OP (GAUZE/BANDAGES/DRESSINGS) ×2 IMPLANT

## 2022-01-10 NOTE — Discharge Instructions (Signed)

## 2022-01-10 NOTE — H&P (Signed)
TOTAL KNEE ADMISSION H&P  Patient is being admitted for left total knee arthroplasty.  Subjective:  Chief Complaint:left knee pain.  HPI: Connie [name], 81 y.o. female, has a history of pain and functional disability in the left knee due to arthritis and has failed non-surgical conservative treatments for greater than 12 weeks to includeNSAID's and/or analgesics, flexibility and strengthening excercises, and weight reduction as appropriate.  Onset of symptoms was gradual, starting 3 years ago with gradually worsening course since that time. The patient noted no past surgery on the left knee(s).  Patient currently rates pain in the left knee(s) at 9 out of 10 with activity. Patient has night pain, worsening of pain with activity and weight bearing, pain that interferes with activities of daily living, and joint swelling.  Patient has evidence of periarticular osteophytes, joint subluxation, and joint space narrowing by imaging studies. This patient has had  failure of all reasonable conservative care . There is no active infection.  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 Luiz Blare   TONSILLECTOMY     as a child   TOTAL KNEE ARTHROPLASTY Right 09/05/2014   Procedure: TOTAL KNEE  ARTHROPLASTY;  Surgeon: Harvie Junior, MD;  Location: [facility] OR;  Service: Orthopedics;  Laterality: Right;   TOTAL SHOULDER ARTHROPLASTY Left 11/17/2017   Procedure: LEFT TOTAL SHOULDER ARTHROPLASTY;  Surgeon: Jodi Geralds, MD;  Location: [facility] OR[facility];  Service: Orthopedics;  Laterality: Left;  with block   TUBAL LIGATION  1972    Current Facility-Administered Medications  Medication Dose Route Frequency Provider Last Rate Last Admin   ceFAZolin (ANCEF) 2-4 GM/100ML-% IVPB            fentaNYL (SUBLIMAZE) 50 MCG/ML injection            lactated ringers infusion   Intravenous Continuous Trevor Iha, MD 10 mL/hr at 01/10/22 1144 New Bag at 01/10/22 1144   midazolam (VERSED) 2 MG/2ML injection            Allergies  Allergen Reactions   Penicillins Rash and Other (See Comments)    Has patient had a PCN reaction causing immediate rash, facial/tongue/throat swelling, SOB or lightheadedness with hypotension: Yes Has patient had a PCN reaction causing severe rash involving mucus membranes or skin necrosis: No Has patient had a PCN reaction that required hospitalization: No Has patient had a PCN reaction occurring within the last 10 years: No If all of the above answers are "NO", then may proceed with Cephalosporin use.    Sulfa Antibiotics Rash    Social History   Tobacco Use  Smoking status: Never   Smokeless tobacco: Never  Substance Use Topics   Alcohol use: Yes    Comment: rarely wine    Family History  Problem Relation Age of Onset   Breast cancer Neg Hx      Review of Systems ROS: I have reviewed the patient's review of systems thoroughly and there are no positive responses as relates to the HPI.  Objective:  Physical Exam  Vital signs in last 24 hours: Temp:  [98.2 F (36.8 C)] 98.2 F (36.8 C) (06/19 1000) Pulse Rate:  [89] 89 (06/19 1000) Resp:  [15] 15 (06/19 1000) BP: (149)/(67) 149/67 (06/19 1000) SpO2:  [100 %] 100 % (06/19 1000) Weight:  [73.5 kg] 73.5 kg (06/19  1000) Well-developed well-nourished patient in no acute distress. Alert and oriented x3 HEENT:within normal limits Cardiac: Regular rate and rhythm Pulmonary: Lungs clear to auscultation Abdomen: Soft and nontender.  Normal active bowel sounds  Musculoskeletal: (l knee: painful ROM limited rom +effusion)  Labs: Recent Results (from the past 2160 hour(s))  Surgical pcr screen     Status: None   Collection Time: 01/04/22 10:02 AM   Specimen: Nasal Mucosa; Nasal Swab  Result Value Ref Range   MRSA, PCR NEGATIVE NEGATIVE   Staphylococcus aureus NEGATIVE NEGATIVE    Comment: (NOTE) The Xpert SA Assay (FDA approved for NASAL specimens in patients 58 years of age and older), is one component of a comprehensive surveillance program. It is not intended to diagnose infection nor to guide or monitor treatment. Performed at [facility], [address] [place], [place] [address]   Basic metabolic panel per protocol     Status: Abnormal   Collection Time: 01/04/22 10:02 AM  Result Value Ref Range   Sodium 134 (L) 135 - 145 mmol/L   Potassium 5.0 3.5 - 5.1 mmol/L   Chloride 98 98 - 111 mmol/L   CO2 27 22 - 32 mmol/L   Glucose, Bld 96 70 - 99 mg/dL    Comment: Glucose reference range applies only to samples taken after fasting for at least 8 hours.   BUN 25 (H) 8 - 23 mg/dL   Creatinine, Ser 5.42 0.44 - 1.00 mg/dL   Calcium 9.7 8.9 - 70.6 mg/dL   GFR, Estimated >23 >76 mL/min    Comment: (NOTE) Calculated using the CKD-EPI Creatinine Equation (2021)    Anion gap 9 5 - 15    Comment: Performed at [facility], [address] [place], [place] [address]  CBC per protocol     Status: Abnormal   Collection Time: 01/04/22 10:02 AM  Result Value Ref Range   WBC 10.7 (H) 4.0 - 10.5 K/uL   RBC 4.46 3.87 - 5.11 MIL/uL   Hemoglobin 12.7 12.0 - 15.0 g/dL   HCT 17.6 16.0 - 73.7 %   MCV 87.9 80.0 - 100.0 fL   MCH 28.5 26.0 - 34.0 pg   MCHC 32.4 30.0 -  36.0 g/dL   RDW 10.6 26.9 - 48.5 %   Platelets 375 150 - 400 K/uL   nRBC 0.0 0.0 - 0.2 %    Comment: Performed at [facility], [address] [place], [place] [address]     Estimated body mass index is 30.61 kg/m as calculated from the following:   Height as of this encounter: 5\' 1"  (1.549 m).   Weight as of this encounter: 73.5 kg.   Imaging Review Plain radiographs demonstrate severe degenerative joint  disease of the left knee(s). The overall alignment issignificant valgus. The bone quality appears to be fair for age and reported activity level.      Assessment/Plan:  End stage arthritis, left knee   The patient history, physical examination, clinical judgment of the provider and imaging studies are consistent with end stage degenerative joint disease of the left knee(s) and total knee arthroplasty is deemed medically necessary. The treatment options including medical management, injection therapy arthroscopy and arthroplasty were discussed at length. The risks and benefits of total knee arthroplasty were presented and reviewed. The risks due to aseptic loosening, infection, stiffness, patella tracking problems, thromboembolic complications and other imponderables were discussed. The patient acknowledged the explanation, agreed to proceed with the plan and consent was signed. Patient is being admitted for inpatient treatment for surgery, pain control, PT, OT, prophylactic antibiotics, VTE prophylaxis, progressive ambulation and ADL's and discharge planning. The patient is planning to be discharged home with home health services     Patient's anticipated LOS is less than 2 midnights, meeting these requirements: - Younger than 67 - Lives within 1 hour of care - Has a competent adult at home to recover with post-op recover - NO history of  - Chronic pain requiring opiods  - Diabetes  - Coronary Artery Disease  - Heart failure  - Heart attack  - Stroke  -  DVT/VTE  - Cardiac arrhythmia  - Respiratory Failure/COPD  - Renal failure  - Anemia  - Advanced Liver disease

## 2022-01-10 NOTE — Anesthesia Procedure Notes (Signed)
Spinal  Patient location during procedure: OR Start time: 01/10/2022 1:27 PM End time: 01/10/2022 1:29 PM Staffing Performed: anesthesiologist  Anesthesiologist: Atilano Median, DO Performed by: Atilano Median, DO Authorized by: Atilano Median, DO   Preanesthetic Checklist Completed: patient identified, IV checked, site marked, risks and benefits discussed, surgical consent, monitors and equipment checked, pre-op evaluation and timeout performed Spinal Block Patient position: sitting Prep: DuraPrep Patient monitoring: heart rate, cardiac monitor, continuous pulse ox and blood pressure Approach: midline Location: L4-5 Injection technique: single-shot Needle Needle type: Pencan  Needle gauge: 24 G Needle length: 10 cm Assessment Events: CSF return Additional Notes Patient identified. Risks/Benefits/Options discussed with patient including but not limited to bleeding, infection, nerve damage, paralysis, failed block, incomplete pain control, headache, blood pressure changes, nausea, vomiting, reactions to medications, itching and postpartum back pain. Confirmed with bedside nurse the patient's most recent platelet count. Confirmed with patient that they are not currently taking any anticoagulation, have any bleeding history or any family history of bleeding disorders. Patient expressed understanding and wished to proceed. All questions were answered. Sterile technique was used throughout the entire procedure. Please see nursing notes for vital signs. Warning signs of high block given to the patient including shortness of breath, tingling/numbness in hands, complete motor block, or any concerning symptoms with instructions to call for help. Patient was given instructions on fall risk and not to get out of bed. All questions and concerns addressed with instructions to call with any issues or inadequate analgesia.

## 2022-01-10 NOTE — Op Note (Signed)
PATIENT ID:      Connie [name]  MRN:     [id number] DOB/AGE:    03-20-1941 / 81 y.o.       OPERATIVE REPORT   DATE OF PROCEDURE:  01/10/2022      PREOPERATIVE DIAGNOSIS:   LEFT KNEE OSTEOARTHRITIS      Estimated body mass index is 30.61 kg/m as calculated from the following:   Height as of this encounter: 5\' 1"  (1.549 m).   Weight as of this encounter: 73.5 kg.                                                       POSTOPERATIVE DIAGNOSIS:   Same                                                                  PROCEDURE:  Procedure(s): TOTAL KNEE ARTHROPLASTY Using DepuyAttune RP implants #5N Femur, #4Tibia, 12 mm Attune RP bearing, 35 Patella   2. Lateral retinacular release SURGEON:  ASSISTANT:   Harvie Junior. Tomi Likens   (Present and scrubbed throughout the case, critical for assistance with exposure, retraction, instrumentation, and closure.)        ANESTHESIA: spinal, 20cc Exparel, 50cc 0.25% Marcaine EBL: min cc FLUID REPLACEMENT: unk cc crystaloid TOURNIQUET: DRAINS: None TRANEXAMIC ACID: 1gm IV, 2gm topical COMPLICATIONS:  None         INDICATIONS FOR PROCEDURE: The patient has  LEFT KNEE OSTEOARTHRITIS, sig valgus deformities, XR shows bone on bone arthritis, lateral subluxation of tibia. Patient has failed all conservative measures including anti-inflammatory medicines, narcotics, attempts at exercise and weight loss, cortisone injections and viscosupplementation.  Risks and benefits of surgery have been discussed, questions answered.   DESCRIPTION OF PROCEDURE: The patient identified by armband, received  IV antibiotics, in the holding area at [facility]. Patient taken to the operating room, appropriate anesthetic monitors were attached, and spinal anesthesia was  induced. IV Tranexamic acid was given.Tourniquet applied high to the operative thigh. Lateral post and foot positioner applied to the table, the lower extremity was then prepped and draped  in usual sterile fashion from the toes to the tourniquet. Time-out procedure was performed. [facility]. [facility] PAC, was present and scrubbed throughout the case, critical for assistance with, positioning, exposure, retraction, instrumentation, and closure.The skin and subcutaneous tissue along the incision was injected with 20 cc of a mixture of Exparel and Marcaine solution, using a 20-gauge by 1-1/2 inch needle. We began the operation, with the knee flexed 130 degrees, by making the anterior midline incision starting at handbreadth above the patella going over the patella 1 cm medial to and 4 cm distal to the tibial tubercle. Small bleeders in the skin and the subcutaneous tissue identified and cauterized. Transverse retinaculum was incised and reflected medially and a medial parapatellar arthrotomy was accomplished. the patella was everted and theprepatellar fat pad resected. The superficial medial collateral ligament was then elevated from anterior to posterior along the proximal flare of the tibia and anterior half of the menisci resected. The knee was hyperflexed exposing bone on  bone arthritis. Peripheral and notch osteophytes as well as the cruciate ligaments were then resected. We continued to work our way around posteriorly along the proximal tibia, and externally rotated the tibia subluxing it out from underneath the femur. A McHale PCL retractor was placed through the notch and a lateral Hohmann retractor placed, and we then entered the proximal tibia in line with the Depuy starter drill in line with the axis of the tibia followed by an intramedullary guide rod and 0-degree posterior slope cutting guide. The tibial cutting guide, 4 degree posterior sloped, was pinned into place allowing resection of 10 mm of bone medially and 2 mm of bone laterally. Satisfied with the tibial resection, we then entered the distal femur 2 mm anterior to the PCL origin with the intramedullary guide rod and applied the distal  femoral cutting guide set at 9 mm, with 5 degrees of valgus. This was pinned along the epicondylar axis. At this point, the distal femoral cut was accomplished without difficulty. We then sized for a #5N femoral component and pinned the guide in 3 degrees of external rotation. The chamfer cutting guide was pinned into place. The anterior, posterior, and chamfer cuts were accomplished without difficulty followed by the Attune RP box cutting guide and the box cut. We also removed posterior osteophytes from the posterior femoral condyles. The posterior capsule was injected with Exparel solution. The knee was brought into full extension. We checked our extension gap and fit a 12 mm bearing. Distracting in extension with a lamina spreader,  bleeders in the posterior capsule, Posterior medial and posterior lateral gutter were cauterized.  The transexamic acid-soaked sponge was then placed in the gap of the knee in extension. The knee was flexed 30. The posterior patella cut was accomplished with the 9.5 mm Attune cutting guide, sized for a 65mm dome, and the fixation pegs drilled.The knee was then once again hyperflexed exposing the proximal tibia. We sized for a # 4 tibial base plate, applied the smokestack and the conical reamer followed by the the Delta fin keel punch. We then hammered into place the Attune RP trial femoral component, drilled the lugs, inserted a  12 mm trial bearing, trial patellar button, and took the knee through range of motion from 0-130 degrees. Medial and lateral ligamentous stability was checked. The patella did have a significant lateral tracking owing to the wild valgus malalignment that she had.  A lateral retinacular release was performed from the joint line up to 2 fingerbreadths proximal to the patella.  Once this was done the patella tracked midline in contact with the prosthesis.The tourniquet was released 49 min. All trial components were removed, mating surfaces irrigated with pulse  lavage, and dried with suction and sponges. 10 cc of the Exparel solution was applied to the cancellus bone of the patella distal femur and proximal tibia.  After waiting 30 seconds, the bony surfaces were again, dried with sponges. A double batch of DePuy HV cement was mixed and applied to all bony metallic mating surfaces except for the posterior condyles of the femur itself. In order, we hammered into place the tibial tray and removed excess cement, the femoral component and removed excess cement. The final Attune RP bearing was inserted, and the knee brought to full extension with compression. The patellar button was clamped into place, and excess cement removed. The knee was held at 30 flexion with compression, while the cement cured. The wound was irrigated out with normal saline solution pulse lavage.  The rest of the Exparel was injected into the parapatellar arthrotomy, subcutaneous tissues, and periosteal tissues. The parapatellar arthrotomy was closed with running #1 Vicryl suture. The subcutaneous tissue with 3-0 undyed Vicryl suture, and the skin with running 3-0 SQ vicryl. An Aquacil and Ace wrap were applied. The patient was taken to recovery room without difficulty.   Harvie Junior 01/10/2022, 8:56 PM

## 2022-01-10 NOTE — Transfer of Care (Signed)
Immediate Anesthesia Transfer of Care Note  Patient: Connie [name]  Procedure(s) Performed: TOTAL KNEE ARTHROPLASTY (Left: Knee)  Patient Location: PACU  Anesthesia Type:Spinal  Level of Consciousness: awake, alert , oriented and patient cooperative  Airway & Oxygen Therapy: Patient Spontanous Breathing and Patient connected to face mask oxygen  Post-op Assessment: Report given to RN and Post -op Vital signs reviewed and stable  Post vital signs: Reviewed and stable  Last Vitals:  Vitals Value Taken Time  BP 97/69 01/10/22 1534  Temp    Pulse 74 01/10/22 1537  Resp 13 01/10/22 1537  SpO2 100 % 01/10/22 1537  Vitals shown include unvalidated device data.  Last Pain:  Vitals:   01/10/22 1000  TempSrc: Oral         Complications: No notable events documented.

## 2022-01-10 NOTE — Anesthesia Procedure Notes (Signed)
Procedure Name: MAC Date/Time: 01/10/2022 1:40 PM  Performed by: Claudia Desanctis, CRNAPre-anesthesia Checklist: Patient identified, Emergency Drugs available, Suction available and Patient being monitored Patient Re-evaluated:Patient Re-evaluated prior to induction Oxygen Delivery Method: Simple face mask

## 2022-01-10 NOTE — Progress Notes (Signed)
Orthopedic Tech Progress Note Patient Details:  Connie [name] March 02, 1941 [id number]  Ortho Devices Type of Ortho Device: Bone foam zero knee Ortho Device/Splint Location: LLE Ortho Device/Splint Interventions: Application   Post Interventions Patient Tolerated: Well  Connie [name] 01/10/2022, 4:27 PM

## 2022-01-10 NOTE — Anesthesia Procedure Notes (Signed)
Anesthesia Regional Block: Adductor canal block   Pre-Anesthetic Checklist: , timeout performed,  Correct Patient, Correct Site, Correct Laterality,  Correct Procedure, Correct Position, site marked,  Risks and benefits discussed,  Surgical consent,  Pre-op evaluation,  At surgeon's request and post-op pain management  Laterality: Left  Prep: Dura Prep       Needles:  Injection technique: Single-shot  Needle Type: Echogenic Stimulator Needle     Needle Length: 10cm  Needle Gauge: 20     Additional Needles:   Procedures:,,,, ultrasound used (permanent image in chart),,    Narrative:  Start time: 01/10/2022 12:40 PM End time: 01/10/2022 12:45 PM Injection made incrementally with aspirations every 5 mL.  Performed by: Personally  Anesthesiologist: Atilano Median, DO  Additional Notes: Patient identified. Risks/Benefits/Options discussed with patient including but not limited to bleeding, infection, nerve damage, failed block, incomplete pain control. Patient expressed understanding and wished to proceed. All questions were answered. Sterile technique was used throughout the entire procedure. Please see nursing notes for vital signs. Aspirated in 5cc intervals with injection for negative confirmation. Patient was given instructions on fall risk and not to get out of bed. All questions and concerns addressed with instructions to call with any issues or inadequate analgesia.

## 2022-01-11 ENCOUNTER — Ambulatory Visit: Payer: Medicare PPO | Admitting: Cardiology

## 2022-01-11 ENCOUNTER — Other Ambulatory Visit: Payer: Self-pay

## 2022-01-11 ENCOUNTER — Encounter (HOSPITAL_COMMUNITY): Payer: Self-pay | Admitting: Orthopedic Surgery

## 2022-01-11 DIAGNOSIS — E039 Hypothyroidism, unspecified: Secondary | ICD-10-CM | POA: Diagnosis not present

## 2022-01-11 DIAGNOSIS — M1712 Unilateral primary osteoarthritis, left knee: Secondary | ICD-10-CM | POA: Diagnosis not present

## 2022-01-11 DIAGNOSIS — Z96651 Presence of right artificial knee joint: Secondary | ICD-10-CM | POA: Diagnosis not present

## 2022-01-11 DIAGNOSIS — Z96612 Presence of left artificial shoulder joint: Secondary | ICD-10-CM | POA: Diagnosis not present

## 2022-01-11 DIAGNOSIS — I10 Essential (primary) hypertension: Secondary | ICD-10-CM | POA: Diagnosis not present

## 2022-01-11 NOTE — Plan of Care (Signed)

## 2022-01-11 NOTE — TOC Transition Note (Signed)
Transition of Care [facility]) - CM/SW Discharge Note   Patient Details  Name: Connie [name] MRN: [id number] Date of Birth: 11/23/1940  [facility] of [facility] CM/SW Contact:  Darleene Cleaver, LCSW Phone Number: 01/11/2022, 12:53 PM   Clinical Narrative:     CSW spoke to patient's husband and he said they all the equipment they needed.  Patient has home health pre arranged through [facility].  TOC signing off, please reconsult if social work needs arise.   Final next level of care: Home w Home Health Services Barriers to Discharge: Barriers Resolved   Patient Goals and CMS Choice Patient states their goals for this hospitalization and ongoing recovery are:: To return back home with home health. CMS Medicare.gov Compare Post Acute Care list provided to:: Patient Represenative (must comment) Choice offered to / list presented to : Spouse  Discharge Placement                       Discharge Plan and Services                DME Arranged: Walker rolling DME Agency: [employer]       HH Arranged: PT HH Agency: [facility]        Social Determinants of Health (SDOH) Interventions     Readmission Risk Interventions     No data to display

## 2022-01-11 NOTE — Progress Notes (Signed)
Subjective: 1 Day Post-Op Procedure(s) (LRB): TOTAL KNEE ARTHROPLASTY (Left) Patient reports pain as moderate.    Objective: Vital signs in last 24 hours: Temp:  [97.6 F (36.4 C)-98.3 F (36.8 C)] 98.1 F (36.7 C) (06/20 0611) Pulse Rate:  [60-89] 84 (06/20 0611) Resp:  [10-18] 18 (06/20 0611) BP: (97-161)/(52-85) 122/57 (06/20 0611) SpO2:  [93 %-100 %] 100 % (06/20 0611) Weight:  [73.5 kg] 73.5 kg (06/19 1000)  Intake/Output from previous day: 06/19 0701 - 06/20 0700 In: 2174.1 [I.V.:1924.1; IV Piggyback:250] Out: 1825 [Urine:1725; Blood:100] Intake/Output this shift: No intake/output data recorded.  No results for input(s): "HGB" in the last 72 hours. No results for input(s): "WBC", "RBC", "HCT", "PLT" in the last 72 hours. No results for input(s): "NA", "K", "CL", "CO2", "BUN", "CREATININE", "GLUCOSE", "CALCIUM" in the last 72 hours. No results for input(s): "LABPT", "INR" in the last 72 hours.  Neurologically intact ABD soft Neurovascular intact Sensation intact distally Intact pulses distally No cellulitis present Compartment soft   Assessment/Plan: 1 Day Post-Op Procedure(s) (LRB): TOTAL KNEE ARTHROPLASTY (Left) Advance diet Up with therapy Discharge home with home health later this morning    Patient's anticipated LOS is less than 2 midnights, meeting these requirements: - Younger than 47 - Lives within 1 hour of care - Has a competent adult at home to recover with post-op recover - NO history of  - Chronic pain requiring opiods  - Diabetes  - Coronary Artery Disease  - Heart failure  - Heart attack  - Stroke  - DVT/VTE  - Cardiac arrhythmia  - Respiratory Failure/COPD  - Renal failure  - Anemia  - Advanced Liver disease     Connie [name] 01/11/2022, 8:17 AM

## 2022-01-11 NOTE — Evaluation (Signed)
Physical Therapy Evaluation Patient Details Name: Connie [name] MRN: [id number] DOB: Dec 03, 1940 Today's Date: 01/11/2022  History of Present Illness  Pt is an 81 year old female s/p Left TKA on 01/10/22  Clinical Impression  Pt is s/p TKA resulting in the deficits listed below (see PT Problem List).  Pt will benefit from skilled PT to increase their independence and safety with mobility to allow discharge to the venue listed below. Pt assisted with ambulating however distance limited by nausea this morning.  Pt assisted to recliner.  Pt plans to d/c home with spouse.        Recommendations for follow up therapy are one component of a multi-disciplinary discharge planning process, led by the attending physician.  Recommendations may be updated based on patient status, additional functional criteria and insurance authorization.  Follow Up Recommendations Follow physician's recommendations for discharge plan and follow up therapies    Assistance Recommended at Discharge Frequent or constant Supervision/Assistance  Patient can return home with the following  A little help with walking and/or transfers;A little help with bathing/dressing/bathroom;Help with stairs or ramp for entrance;Assist for transportation;Assistance with cooking/housework    Equipment Recommendations None recommended by PT  Recommendations for Other Services       Functional Status Assessment Patient has had a recent decline in their functional status and demonstrates the ability to make significant improvements in function in a reasonable and predictable amount of time.     Precautions / Restrictions Precautions Precautions: Fall;Knee Restrictions Weight Bearing Restrictions: No      Mobility  Bed Mobility Overal bed mobility: Needs Assistance Bed Mobility: Supine to Sit     Supine to sit: Min guard, HOB elevated     General bed mobility comments: increased time, cues for self assist     Transfers Overall transfer level: Needs assistance Equipment used: Rolling walker (2 wheels) Transfers: Sit to/from Stand Sit to Stand: Min assist           General transfer comment: assist to rise and steady, cues for UE and LE positioning    Ambulation/Gait Ambulation/Gait assistance: Min assist, Min guard Gait Distance (Feet): 20 Feet Assistive device: Rolling walker (2 wheels) Gait Pattern/deviations: Step-to pattern, Decreased stance time - left, Antalgic       General Gait Details: verbal cues for sequence, RW positioning, step length; pt limited by nausea  Stairs            Wheelchair Mobility    Modified Rankin (Stroke Patients Only)       Balance                                             Pertinent Vitals/Pain Pain Assessment Pain Assessment: 0-10 Pain Score: 5  Pain Location: left knee Pain Descriptors / Indicators: Sore, Tender Pain Intervention(s): Repositioned, Monitored during session, Premedicated before session    Home Living Family/patient expects to be discharged to:: Private residence Living Arrangements: Spouse/significant other Available Help at Discharge: Family Type of Home: House Home Access: Level entry       Home Layout: One level Home Equipment: Agricultural consultant (2 wheels)      Prior Function Prior Level of Function : Independent/Modified Independent                     Hand Dominance        Extremity/Trunk  Assessment        Lower Extremity Assessment Lower Extremity Assessment: LLE deficits/detail LLE Deficits / Details: able to perform ankle pumps, observed approx 80* knee flexion upon pt sitting EOB       Communication   Communication: No difficulties  Cognition Arousal/Alertness: Awake/alert Behavior During Therapy: WFL for tasks assessed/performed Overall Cognitive Status: Within Functional Limits for tasks assessed                                           General Comments      Exercises     Assessment/Plan    PT Assessment Patient needs continued PT services  PT Problem List Decreased strength;Decreased range of motion;Decreased mobility;Decreased knowledge of precautions;Decreased balance;Pain;Decreased knowledge of use of DME       PT Treatment Interventions Stair training;Gait training;Balance training;DME instruction;Therapeutic exercise;Functional mobility training;Therapeutic activities;Patient/family education    PT Goals (Current goals can be found in the Care Plan section)  Acute Rehab PT Goals PT Goal Formulation: With patient/family Time For Goal Achievement: 01/18/22 Potential to Achieve Goals: Good    Frequency 7X/week     Co-evaluation               AM-PAC PT "6 Clicks" Mobility  Outcome Measure Help needed turning from your back to your side while in a flat bed without using bedrails?: A Little Help needed moving from lying on your back to sitting on the side of a flat bed without using bedrails?: A Little Help needed moving to and from a bed to a chair (including a wheelchair)?: A Little Help needed standing up from a chair using your arms (e.g., wheelchair or bedside chair)?: A Little Help needed to walk in hospital room?: A Lot Help needed climbing 3-5 steps with a railing? : A Lot 6 Click Score: 16    End of Session Equipment Utilized During Treatment: Gait belt Activity Tolerance: Other (comment) (limited by nausea) Patient left: in chair;with call bell/phone within reach;with nursing/sitter in room;with family/visitor present;with chair alarm set Nurse Communication: Mobility status PT Visit Diagnosis: Other abnormalities of gait and mobility (R26.89)    Time: 7846-9629 PT Time Calculation (min) (ACUTE ONLY): 14 min   Charges:   PT Evaluation $PT Eval Low Complexity: 1 Low         Kati PT, DPT Acute Rehabilitation Services Pager: [phone] Office: [phone]   Janan Halter  [place] 01/11/2022, 11:27 AM

## 2022-01-11 NOTE — Plan of Care (Signed)
  Problem: Clinical Measurements: Goal: Respiratory complications will improve Outcome: Not Applicable Goal: Cardiovascular complication will be avoided Outcome: Not Applicable   Problem: Nutrition: Goal: Adequate nutrition will be maintained Outcome: Progressing   Problem: Coping: Goal: Level of anxiety will decrease Outcome: Progressing   Problem: Pain Managment: Goal: General experience of comfort will improve Outcome: Progressing

## 2022-01-11 NOTE — Discharge Summary (Signed)
Patient ID: Connie [name] MRN: [id number] DOB/AGE: 28-Jul-1940 81 y.o.  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 female who was admitted 01/10/2022 for operative treatment ofS/P total knee arthroplasty, left. Patient has severe unremitting pain that affects sleep, daily activities, and work/hobbies. After pre-op clearance the patient was taken to the operating room on 01/10/2022 and underwent  Procedure(s): TOTAL KNEE ARTHROPLASTY.    Patient was given perioperative antibiotics:  Anti-infectives (From admission, onward)    Start     Dose/Rate Route Frequency Ordered Stop   01/10/22 1127  ceFAZolin (ANCEF) 2-4 GM/100ML-% IVPB       Note to Pharmacy: Tawanna Sat H: cabinet override      01/10/22 1127 01/10/22 2344        Patient was given sequential compression devices, early ambulation, and chemoprophylaxis to prevent DVT.  Patient benefited maximally from hospital stay and there were no complications.    Recent vital signs: Patient Vitals for the past 24 hrs:  BP Temp Temp src Pulse Resp SpO2  01/11/22 1319 (!) 97/44 98.4 F (36.9 C) Oral 70 16 94 %  01/11/22 1307 (!) 124/38 -- -- -- -- --  01/11/22 0932 125/63 98.2 F (36.8 C) Oral 80 18 98 %  01/11/22 0611 (!) 122/57 98.1 F (36.7 C) Oral 84 18 100 %  01/11/22 0138 (!) 139/52 98.3 F (36.8  C) Oral 83 18 100 %  01/10/22 2152 (!) 157/62 98.2 F (36.8 C) Oral 76 18 100 %  01/10/22 1900 (!) 161/61 97.6 F (36.4 C) Oral 70 14 100 %  01/10/22 1815 (!) 150/59 -- -- 70 11 99 %  01/10/22 1715 (!) 146/65 -- -- 66 10 100 %  01/10/22 1630 (!) 143/74 -- -- 60 10 100 %  01/10/22 1615 (!) 136/59 -- -- 63 14 99 %  01/10/22 1600 123/67 -- -- 64 11 100 %  01/10/22 1545 (!) 115/56 -- -- 64 10 100 %  01/10/22 1535 97/69 97.6 F (36.4 C) -- 74 14 93 %     Recent laboratory studies: No results for input(s): "WBC", "HGB", "HCT", "PLT", "NA", "K", "CL", "CO2", "BUN", "CREATININE", "GLUCOSE", "INR", "CALCIUM" in the last 72 hours.  Invalid input(s): "PT", "2"   Discharge Medications:   Allergies as of 01/11/2022       Reactions   Penicillins Rash, Other (See Comments)   Has patient had a PCN reaction causing immediate rash, facial/tongue/throat swelling, SOB or lightheadedness with hypotension: Yes Has patient had a PCN reaction causing severe rash involving mucus membranes or skin necrosis: No Has patient had a PCN reaction that required hospitalization: No Has patient had a PCN reaction occurring within the last 10 years: No If all of the above answers are "NO", then may proceed with Cephalosporin use.  Sulfa Antibiotics Rash        Medication List     STOP taking these medications    apixaban 5 MG Tabs tablet Commonly known as: ELIQUIS   gabapentin 300 MG capsule Commonly known as: NEURONTIN   HYDROcodone-acetaminophen 5-325 MG tablet Commonly known as: Norco   naproxen sodium 220 MG tablet Commonly known as: [place] COVID-19 Vac Bivalent injection Generic drug: COVID-19 mRNA bivalent vaccine Therapist, music)   Pfizer-BioNT COVID-19 Vac-TriS Susp injection Generic drug: COVID-19 mRNA Vac-TriS Therapist, music)   PRESERVISION AREDS 2 PO   rosuvastatin 10 MG tablet Commonly known as: CRESTOR       TAKE these medications    aspirin EC 325 MG tablet Take 1 tablet (325  mg total) by mouth 2 (two) times daily.   carvedilol 6.25 MG tablet Commonly known as: COREG Take 1 tablet (6.25 mg total) by mouth 2 (two) times daily.   hydrochlorothiazide 25 MG tablet Commonly known as: HYDRODIURIL Take 25 mg by mouth daily.   irbesartan 150 MG tablet Commonly known as: AVAPRO Take 150 mg by mouth daily.   levothyroxine 88 MCG tablet Commonly known as: SYNTHROID Take 88 mcg by mouth daily before breakfast.   oxyCODONE-acetaminophen 5-325 MG tablet Commonly known as: PERCOCET/ROXICET Take 1 tablet by mouth every 4 (four) hours as needed for severe pain.   sertraline 100 MG tablet Commonly known as: ZOLOFT Take 100 mg by mouth daily.   THERATEARS OP Place 1 drop into both eyes 2 (two) times daily as needed (for dry eyes).   tiZANidine 2 MG tablet Commonly known as: ZANAFLEX Take 1 tablet (2 mg total) by mouth every 6 (six) hours as needed.               Durable Medical Equipment  (From admission, onward)           Start     Ordered   01/10/22 1913  DME Walker rolling  Once       Question:  Patient needs a walker to treat with the following condition  Answer:  Status post total left knee replacement   01/10/22 1912   01/10/22 1913  DME 3 n 1  Once        01/10/22 1912              Discharge Care Instructions  (From admission, onward)           Start     Ordered   01/11/22 0000  Weight bearing as tolerated        01/11/22 1418   01/10/22 0000  Weight bearing as tolerated        01/10/22 1534            Diagnostic Studies: No results found.  Disposition: Discharge disposition: 01-Home or Self Care       Discharge Instructions     Call MD / Call 911   Complete by: As directed    If you experience chest pain or shortness of breath, CALL 911 and be transported to the hospital emergency room.  If you develope a fever above 101 F, pus (white drainage) or increased drainage or redness at the wound, or calf pain,  call your surgeon's office.   Call MD / Call 911   Complete by: As directed    If you experience chest pain or shortness of breath, CALL 911 and be transported to the hospital emergency room.  If you develope a fever above  101 F, pus (white drainage) or increased drainage or redness at the wound, or calf pain, call your surgeon's office.   Constipation Prevention   Complete by: As directed    Drink plenty of fluids.  Prune juice may be helpful.  You may use a stool softener, such as Colace (over the counter) 100 mg twice a day.  Use MiraLax (over the counter) for constipation as needed.   Constipation Prevention   Complete by: As directed    Drink plenty of fluids.  Prune juice may be helpful.  You may use a stool softener, such as Colace (over the counter) 100 mg twice a day.  Use MiraLax (over the counter) for constipation as needed.   Diet - low sodium heart healthy   Complete by: As directed    Driving restrictions   Complete by: As directed    No driving for 2 weeks   Driving restrictions   Complete by: As directed    No driving for 2 weeks   Increase activity slowly as tolerated   Complete by: As directed    Increase activity slowly as tolerated   Complete by: As directed    Patient may shower   Complete by: As directed    You may shower without a dressing once there is no drainage.  Do not wash over the wound.  If drainage remains, cover wound with plastic wrap and then shower.   Patient may shower   Complete by: As directed    You may shower without a dressing once there is no drainage.  Do not wash over the wound.  If drainage remains, cover wound with plastic wrap and then shower.   Post-operative opioid taper instructions:   Complete by: As directed    POST-OPERATIVE OPIOID TAPER INSTRUCTIONS: It is important to wean off of your opioid medication as soon as possible. If you do not need pain medication after your surgery it is ok to stop day one. Opioids include: Codeine,  Hydrocodone(Norco, Vicodin), Oxycodone(Percocet, oxycontin) and hydromorphone amongst others.  Long term and even short term use of opiods can cause: Increased pain response Dependence Constipation Depression Respiratory depression And more.  Withdrawal symptoms can include Flu like symptoms Nausea, vomiting And more Techniques to manage these symptoms Hydrate well Eat regular healthy meals Stay active Use relaxation techniques(deep breathing, meditating, yoga) Do Not substitute Alcohol to help with tapering If you have been on opioids for less than two weeks and do not have pain than it is ok to stop all together.  Plan to wean off of opioids This plan should start within one week post op of your joint replacement. Maintain the same interval or time between taking each dose and first decrease the dose.  Cut the total daily intake of opioids by one tablet each day Next start to increase the time between doses. The last dose that should be eliminated is the evening dose.      Post-operative opioid taper instructions:   Complete by: As directed    POST-OPERATIVE OPIOID TAPER INSTRUCTIONS: It is important to wean off of your opioid medication as soon as possible. If you do not need pain medication after your surgery it is ok to stop day one. Opioids include: Codeine, Hydrocodone(Norco, Vicodin), Oxycodone(Percocet, oxycontin) and hydromorphone amongst others.  Long term and even short term use of opiods can cause: Increased pain response Dependence Constipation Depression Respiratory depression And more.  Withdrawal symptoms can include Flu like symptoms Nausea, vomiting And  more Techniques to manage these symptoms Hydrate well Eat regular healthy meals Stay active Use relaxation techniques(deep breathing, meditating, yoga) Do Not substitute Alcohol to help with tapering If you have been on opioids for less than two weeks and do not have pain than it is ok to stop all  together.  Plan to wean off of opioids This plan should start within one week post op of your joint replacement. Maintain the same interval or time between taking each dose and first decrease the dose.  Cut the total daily intake of opioids by one tablet each day Next start to increase the time between doses. The last dose that should be eliminated is the evening dose.      Weight bearing as tolerated   Complete by: As directed    Weight bearing as tolerated   Complete by: As directed         Follow-up Information     Dorna Leitz, MD. Go on 01/24/2022.   Specialty: Orthopedic Surgery Why: Your appointment has been scheduled for 10:00 Contact information: [place] [address] [phone]         [facility] Follow up.   Specialty: [place] Why: HHPT will provide 6 home visits prior to you starting outpatient physical therapy Contact information: [address] [place] [address] [phone]         [facility] [place]. Go on 01/24/2022.   Why: Your outpatient physical therapy is scheduled for 10:40. Please go across the lobby after your MD appointment to complete your paperwork Contact information: Physical Therapy [place] [address] [phone]                  Signed: Joanell Rising 01/11/2022, 2:18 PM

## 2022-01-11 NOTE — Progress Notes (Signed)
Physical Therapy Treatment Patient Details Name: Connie [name] MRN: [id number] DOB: January 23, 1941 Today's Date: 01/11/2022   History of Present Illness Pt is an 81 year old female s/p Left TKA on 01/10/22    PT Comments    Pt assisted with ambulating in hallway and more unsteady this afternoon with pt reporting "wooziness" and feeling off.  Pt does not appear safe to d/c home and spouse present and agrees.  Pt also performed a few exercises in recliner however fatigued quickly.   Recommendations for follow up therapy are one component of a multi-disciplinary discharge planning process, led by the attending physician.  Recommendations may be updated based on patient status, additional functional criteria and insurance authorization.  Follow Up Recommendations  Follow physician's recommendations for discharge plan and follow up therapies     Assistance Recommended at Discharge Frequent or constant Supervision/Assistance  Patient can return home with the following A little help with walking and/or transfers;A little help with bathing/dressing/bathroom;Help with stairs or ramp for entrance;Assist for transportation;Assistance with cooking/housework   Equipment Recommendations  None recommended by PT    Recommendations for Other Services       Precautions / Restrictions Precautions Precautions: Fall;Knee Restrictions Weight Bearing Restrictions: No     Mobility  Bed Mobility Overal bed mobility: Needs Assistance Bed Mobility: Supine to Sit     Supine to sit: Min guard, HOB elevated     General bed mobility comments: pt in recliner    Transfers Overall transfer level: Needs assistance Equipment used: Rolling walker (2 wheels) Transfers: Sit to/from Stand Sit to Stand: Min assist           General transfer comment: assist to rise and steady, cues for UE and LE positioning    Ambulation/Gait Ambulation/Gait assistance: Min assist, Mod assist Gait Distance (Feet):  20 Feet Assistive device: Rolling walker (2 wheels) Gait Pattern/deviations: Step-to pattern, Decreased stance time - left, Antalgic, Drifts right/left       General Gait Details: verbal cues for sequence, RW positioning, step length; pt more unsteady and requiring more assist this afternoon (only had Zofran per RN); assist required to prevent falls, pt reports feeling off; BP 124/38 mmHg and RN aware   Stairs             Wheelchair Mobility    Modified Rankin (Stroke Patients Only)       Balance                                            Cognition Arousal/Alertness: Awake/alert Behavior During Therapy: WFL for tasks assessed/performed Overall Cognitive Status: Within Functional Limits for tasks assessed                                          Exercises Total Joint Exercises Ankle Circles/Pumps: AROM, Both, 10 reps Quad Sets: AROM, Both, 10 reps Short Arc Quad: AAROM, Left, 10 reps Heel Slides: AAROM, Left, 10 reps    General Comments        Pertinent Vitals/Pain Pain Assessment Pain Assessment: 0-10 Pain Score: 3  Pain Location: left knee Pain Descriptors / Indicators: Sore, Tender Pain Intervention(s): Monitored during session, Repositioned    Home Living  Prior Function            PT Goals (current goals can now be found in the care plan section) Progress towards PT goals: Progressing toward goals    Frequency    7X/week      PT Plan Current plan remains appropriate    Co-evaluation              AM-PAC PT "6 Clicks" Mobility   Outcome Measure  Help needed turning from your back to your side while in a flat bed without using bedrails?: A Little Help needed moving from lying on your back to sitting on the side of a flat bed without using bedrails?: A Little Help needed moving to and from a bed to a chair (including a wheelchair)?: A Lot Help needed standing up  from a chair using your arms (e.g., wheelchair or bedside chair)?: A Lot Help needed to walk in hospital room?: A Lot Help needed climbing 3-5 steps with a railing? : A Lot 6 Click Score: 14    End of Session Equipment Utilized During Treatment: Gait belt Activity Tolerance: Other (comment) (limited by wooziness) Patient left: in chair;with call bell/phone within reach;with chair alarm set Nurse Communication: Mobility status PT Visit Diagnosis: Other abnormalities of gait and mobility (R26.89)     Time: 3159-4585 PT Time Calculation (min) (ACUTE ONLY): 21 min  Charges:  $Gait Training: 8-22 mins                     Paulino Door, DPT Acute Rehabilitation Services Pager: [phone] Office: [phone]    Connie [name] 01/11/2022, 4:32 PM

## 2022-01-11 NOTE — Anesthesia Postprocedure Evaluation (Signed)
Anesthesia Post Note  Patient: Connie [name]  Procedure(s) Performed: TOTAL KNEE ARTHROPLASTY (Left: Knee)     Patient location during evaluation: PACU Anesthesia Type: Regional, Spinal and MAC Level of consciousness: awake and alert Pain management: pain level controlled Vital Signs Assessment: post-procedure vital signs reviewed and stable Respiratory status: spontaneous breathing, nonlabored ventilation, respiratory function stable and patient connected to nasal cannula oxygen Cardiovascular status: stable and blood pressure returned to baseline Postop Assessment: no apparent nausea or vomiting Anesthetic complications: no   No notable events documented.  Last Vitals:  Vitals:   01/11/22 0138 01/11/22 0611  BP: (!) 139/52 (!) 122/57  Pulse: 83 84  Resp: 18 18  Temp: 36.8 C 36.7 C  SpO2: 100% 100%    Last Pain:  Vitals:   01/11/22 0703  TempSrc:   PainSc: 10-Worst pain ever                 March Rummage Jameek Bruntz

## 2022-01-11 NOTE — Plan of Care (Signed)

## 2022-01-12 DIAGNOSIS — I10 Essential (primary) hypertension: Secondary | ICD-10-CM | POA: Diagnosis not present

## 2022-01-12 DIAGNOSIS — M1712 Unilateral primary osteoarthritis, left knee: Secondary | ICD-10-CM | POA: Diagnosis not present

## 2022-01-12 DIAGNOSIS — Z96612 Presence of left artificial shoulder joint: Secondary | ICD-10-CM | POA: Diagnosis not present

## 2022-01-12 DIAGNOSIS — E039 Hypothyroidism, unspecified: Secondary | ICD-10-CM | POA: Diagnosis not present

## 2022-01-12 DIAGNOSIS — Z96651 Presence of right artificial knee joint: Secondary | ICD-10-CM | POA: Diagnosis not present

## 2022-01-12 NOTE — Progress Notes (Signed)
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  Pt and spouse report understanding and feel ready for d/c home today.    Recommendations for follow up therapy are one component of a multi-disciplinary discharge planning process, led by the attending physician.  Recommendations may be updated based on patient status, additional functional criteria and insurance authorization.  Follow Up Recommendations  Follow physician's recommendations for discharge plan and follow up therapies     Assistance Recommended at Discharge Frequent or constant Supervision/Assistance  Patient can return home with the following A little help with walking and/or transfers;A little help with bathing/dressing/bathroom;Help with stairs or ramp for entrance;Assist for transportation;Assistance with cooking/housework   Equipment Recommendations  None recommended by PT    Recommendations for Other Services       Precautions / Restrictions Precautions Precautions: Fall;Knee Restrictions Weight Bearing Restrictions: No     Mobility  Bed Mobility               General bed mobility comments: pt in recliner    Transfers Overall transfer level: Needs assistance Equipment used: Rolling walker (2 wheels) Transfers: Sit to/from Stand Sit to Stand: Min guard           General transfer comment: pt able to recall good positioning    Ambulation/Gait Ambulation/Gait assistance: Min guard Gait Distance (Feet): 160 Feet Assistive device: Rolling walker (2 wheels) Gait Pattern/deviations: Step-to pattern, Decreased stance time - left, Antalgic       General Gait Details: verbal cues for RW positioning, step length, posture; pt denies  dizziness   Stairs Stairs: Yes Stairs assistance: Min guard Stair Management: Step to pattern, Forwards, One rail Right Number of Stairs: 2 General stair comments: verbal cues for sequence and safety; pt preferred to use one rail and spouse on other side; spouse present and practiced with pt; performed twice   Wheelchair Mobility    Modified Rankin (Stroke Patients Only)       Balance                                            Cognition Arousal/Alertness: Awake/alert Behavior During Therapy: WFL for tasks assessed/performed Overall Cognitive Status: Within Functional Limits for tasks assessed                                          Exercises     General Comments        Pertinent Vitals/Pain Pain Assessment Pain Assessment: 0-10 Pain Score: 4  Pain Location: left knee Pain Descriptors / Indicators: Sore, Tender Pain Intervention(s): Repositioned, Monitored during session    Home Living                          Prior Function            PT Goals (current goals can now be found in the care plan section) Progress towards PT goals: Progressing toward goals    Frequency    7X/week  PT Plan Current plan remains appropriate    Co-evaluation              AM-PAC PT "6 Clicks" Mobility   Outcome Measure  Help needed turning from your back to your side while in a flat bed without using bedrails?: A Little Help needed moving from lying on your back to sitting on the side of a flat bed without using bedrails?: A Little Help needed moving to and from a bed to a chair (including a wheelchair)?: A Little Help needed standing up from a chair using your arms (e.g., wheelchair or bedside chair)?: A Little Help needed to walk in hospital room?: A Little Help needed climbing 3-5 steps with a railing? : A Little 6 Click Score: 18    End of Session Equipment Utilized During Treatment: Gait belt Activity  Tolerance: Patient tolerated treatment well Patient left: in chair;with call bell/phone within reach;with chair alarm set;with family/visitor present Nurse Communication: Mobility status PT Visit Diagnosis: Other abnormalities of gait and mobility (R26.89)     Time: 7062-3762 PT Time Calculation (min) (ACUTE ONLY): 12 min  Charges:  $Gait Training: 8-22 mins                    Paulino Door, DPT Acute Rehabilitation Services Pager: [phone] Office: [phone]   Janan Halter Payson 01/12/2022, 4:28 PM

## 2022-01-12 NOTE — Progress Notes (Signed)
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  Pt ambulated in hallway and tolerated improved distance and denied symptoms.  Pt performed LE exercises and fatigued.  Pt plans to d/c home this afternoon after practicing steps.    Recommendations for follow up therapy are one component of a multi-disciplinary discharge planning process, led by the attending physician.  Recommendations may be updated based on patient status, additional functional criteria and insurance authorization.  Follow Up Recommendations  Follow physician's recommendations for discharge plan and follow up therapies     Assistance Recommended at Discharge Frequent or constant Supervision/Assistance  Patient can return home with the following A little help with walking and/or transfers;A little help with bathing/dressing/bathroom;Help with stairs or ramp for entrance;Assist for transportation;Assistance with cooking/housework   Equipment Recommendations  None recommended by PT    Recommendations for Other Services       Precautions / Restrictions Precautions Precautions: Fall;Knee Restrictions Weight Bearing Restrictions: No     Mobility  Bed Mobility               General bed mobility comments: pt in recliner    Transfers Overall transfer level: Needs assistance Equipment used: Rolling walker (2 wheels) Transfers: Sit to/from Stand Sit to Stand: Min guard           General transfer comment: cues for UE and LE positioning    Ambulation/Gait Ambulation/Gait assistance: Min guard Gait Distance (Feet): 120 Feet Assistive device: Rolling walker (2 wheels) Gait Pattern/deviations: Step-to pattern, Decreased stance time - left, Antalgic, Drifts right/left       General Gait  Details: verbal cues for sequence, RW positioning, step length, posture; pt denies dizziness   Stairs             Wheelchair Mobility    Modified Rankin (Stroke Patients Only)       Balance                                            Cognition Arousal/Alertness: Awake/alert Behavior During Therapy: WFL for tasks assessed/performed Overall Cognitive Status: Within Functional Limits for tasks assessed                                          Exercises Total Joint Exercises Ankle Circles/Pumps: AROM, Both, 10 reps Quad Sets: AROM, Both, 10 reps Short Arc Quad: Left, 10 reps, AROM Heel Slides: AAROM, Left, 10 reps Hip ABduction/ADduction: AAROM, Left, 10 reps Straight Leg Raises: AROM, Left, 10 reps    General Comments        Pertinent Vitals/Pain Pain Assessment Pain Assessment: 0-10 Pain Score: 5  Pain Location: left knee Pain Descriptors / Indicators: Sore, Tender Pain Intervention(s): Monitored during session, Repositioned    Home Living                          Prior Function            PT Goals (current goals can now be found in the care plan section)  Progress towards PT goals: Progressing toward goals    Frequency    7X/week      PT Plan Current plan remains appropriate    Co-evaluation              AM-PAC PT "6 Clicks" Mobility   Outcome Measure  Help needed turning from your back to your side while in a flat bed without using bedrails?: A Little Help needed moving from lying on your back to sitting on the side of a flat bed without using bedrails?: A Little Help needed moving to and from a bed to a chair (including a wheelchair)?: A Little Help needed standing up from a chair using your arms (e.g., wheelchair or bedside chair)?: A Little Help needed to walk in hospital room?: A Little Help needed climbing 3-5 steps with a railing? : A Little 6 Click Score: 18    End of Session  Equipment Utilized During Treatment: Gait belt Activity Tolerance: Patient tolerated treatment well Patient left: in chair;with call bell/phone within reach;with chair alarm set;with family/visitor present   PT Visit Diagnosis: Other abnormalities of gait and mobility (R26.89)     Time: 0102-7253 PT Time Calculation (min) (ACUTE ONLY): 17 min  Charges:  $Therapeutic Exercise: 8-22 mins                     Thomasene Mohair PT, DPT Acute Rehabilitation Services Pager: [phone] Office: [phone]    Janan Halter Payson 01/12/2022, 4:24 PM

## 2022-01-12 NOTE — Progress Notes (Signed)
Discharge instructions and medications reviewed with patient and husband. All questions and concerns addressed. All belongings and DME sent with patient. Vital signs stable. IV removed per protocol, intact, tolerated well. Currently denies any discomfort.

## 2022-01-12 NOTE — Plan of Care (Signed)

## 2022-01-24 DIAGNOSIS — M7918 Myalgia, other site: Secondary | ICD-10-CM | POA: Diagnosis not present

## 2022-01-24 DIAGNOSIS — Z96652 Presence of left artificial knee joint: Secondary | ICD-10-CM | POA: Diagnosis not present

## 2022-01-24 DIAGNOSIS — M25662 Stiffness of left knee, not elsewhere classified: Secondary | ICD-10-CM | POA: Diagnosis not present

## 2022-01-24 DIAGNOSIS — M6281 Muscle weakness (generalized): Secondary | ICD-10-CM | POA: Diagnosis not present

## 2022-01-24 DIAGNOSIS — M25512 Pain in left shoulder: Secondary | ICD-10-CM | POA: Diagnosis not present

## 2022-02-01 DIAGNOSIS — M6281 Muscle weakness (generalized): Secondary | ICD-10-CM | POA: Diagnosis not present

## 2022-02-01 DIAGNOSIS — Z96652 Presence of left artificial knee joint: Secondary | ICD-10-CM | POA: Diagnosis not present

## 2022-02-01 DIAGNOSIS — M25662 Stiffness of left knee, not elsewhere classified: Secondary | ICD-10-CM | POA: Diagnosis not present

## 2022-02-03 DIAGNOSIS — M25662 Stiffness of left knee, not elsewhere classified: Secondary | ICD-10-CM | POA: Diagnosis not present

## 2022-02-03 DIAGNOSIS — M6281 Muscle weakness (generalized): Secondary | ICD-10-CM | POA: Diagnosis not present

## 2022-02-03 DIAGNOSIS — Z96652 Presence of left artificial knee joint: Secondary | ICD-10-CM | POA: Diagnosis not present

## 2022-02-08 DIAGNOSIS — M25662 Stiffness of left knee, not elsewhere classified: Secondary | ICD-10-CM | POA: Diagnosis not present

## 2022-02-08 DIAGNOSIS — Z96652 Presence of left artificial knee joint: Secondary | ICD-10-CM | POA: Diagnosis not present

## 2022-02-08 DIAGNOSIS — M6281 Muscle weakness (generalized): Secondary | ICD-10-CM | POA: Diagnosis not present

## 2022-02-10 DIAGNOSIS — Z96652 Presence of left artificial knee joint: Secondary | ICD-10-CM | POA: Diagnosis not present

## 2022-02-10 DIAGNOSIS — M25662 Stiffness of left knee, not elsewhere classified: Secondary | ICD-10-CM | POA: Diagnosis not present

## 2022-02-10 DIAGNOSIS — M6281 Muscle weakness (generalized): Secondary | ICD-10-CM | POA: Diagnosis not present

## 2022-02-14 DIAGNOSIS — M8580 Other specified disorders of bone density and structure, unspecified site: Secondary | ICD-10-CM | POA: Diagnosis not present

## 2022-02-14 DIAGNOSIS — E039 Hypothyroidism, unspecified: Secondary | ICD-10-CM | POA: Diagnosis not present

## 2022-02-14 DIAGNOSIS — I10 Essential (primary) hypertension: Secondary | ICD-10-CM | POA: Diagnosis not present

## 2022-02-14 DIAGNOSIS — E7800 Pure hypercholesterolemia, unspecified: Secondary | ICD-10-CM | POA: Diagnosis not present

## 2022-02-14 DIAGNOSIS — L4050 Arthropathic psoriasis, unspecified: Secondary | ICD-10-CM | POA: Diagnosis not present

## 2022-02-14 DIAGNOSIS — R202 Paresthesia of skin: Secondary | ICD-10-CM | POA: Diagnosis not present

## 2022-02-14 DIAGNOSIS — F322 Major depressive disorder, single episode, severe without psychotic features: Secondary | ICD-10-CM | POA: Diagnosis not present

## 2022-02-14 DIAGNOSIS — Z0000 Encounter for general adult medical examination without abnormal findings: Secondary | ICD-10-CM | POA: Diagnosis not present

## 2022-02-14 DIAGNOSIS — K219 Gastro-esophageal reflux disease without esophagitis: Secondary | ICD-10-CM | POA: Diagnosis not present

## 2022-02-15 ENCOUNTER — Other Ambulatory Visit: Payer: Self-pay | Admitting: Family Medicine

## 2022-02-15 DIAGNOSIS — Z96652 Presence of left artificial knee joint: Secondary | ICD-10-CM | POA: Diagnosis not present

## 2022-02-15 DIAGNOSIS — E7800 Pure hypercholesterolemia, unspecified: Secondary | ICD-10-CM | POA: Diagnosis not present

## 2022-02-15 DIAGNOSIS — M8580 Other specified disorders of bone density and structure, unspecified site: Secondary | ICD-10-CM

## 2022-02-15 DIAGNOSIS — M6281 Muscle weakness (generalized): Secondary | ICD-10-CM | POA: Diagnosis not present

## 2022-02-15 DIAGNOSIS — M25662 Stiffness of left knee, not elsewhere classified: Secondary | ICD-10-CM | POA: Diagnosis not present

## 2022-02-17 DIAGNOSIS — H35423 Microcystoid degeneration of retina, bilateral: Secondary | ICD-10-CM | POA: Diagnosis not present

## 2022-02-17 DIAGNOSIS — H43813 Vitreous degeneration, bilateral: Secondary | ICD-10-CM | POA: Diagnosis not present

## 2022-02-17 DIAGNOSIS — H353114 Nonexudative age-related macular degeneration, right eye, advanced atrophic with subfoveal involvement: Secondary | ICD-10-CM | POA: Diagnosis not present

## 2022-02-17 DIAGNOSIS — H353123 Nonexudative age-related macular degeneration, left eye, advanced atrophic without subfoveal involvement: Secondary | ICD-10-CM | POA: Diagnosis not present

## 2022-02-22 DIAGNOSIS — Z96652 Presence of left artificial knee joint: Secondary | ICD-10-CM | POA: Diagnosis not present

## 2022-02-22 DIAGNOSIS — M25662 Stiffness of left knee, not elsewhere classified: Secondary | ICD-10-CM | POA: Diagnosis not present

## 2022-02-22 DIAGNOSIS — M6281 Muscle weakness (generalized): Secondary | ICD-10-CM | POA: Diagnosis not present

## 2022-03-01 DIAGNOSIS — M25512 Pain in left shoulder: Secondary | ICD-10-CM | POA: Diagnosis not present

## 2022-03-04 ENCOUNTER — Telehealth: Payer: Self-pay | Admitting: Cardiology

## 2022-03-04 NOTE — Telephone Encounter (Signed)
Pt c/o BP issue: STAT if pt c/o blurred vision, one-sided weakness or slurred speech  1. What are your last 5 BP readings? No  2. Are you having any other symptoms (ex. Dizziness, headache, blurred vision, passed out)?   Dizziness, blurred vision  3. What is your BP issue?   Patient called wanting a sooner appointment with Dr. Bjorn Pippin.

## 2022-03-04 NOTE — Telephone Encounter (Signed)
Returned the call to the patient. She was calling to get a sooner appointment with Dr. Bjorn Pippin. She stated that she has been having high blood pressures but does not take it daily. She has been advised to check it daily, an hour or two after her medication, and keep a log of these pressures. Blood pressure today was 154/71.  Appointment made for 8/15 with Azalee Course, PA. She has been advised to call back if anything is needed prior to this.

## 2022-03-08 ENCOUNTER — Ambulatory Visit: Payer: Medicare PPO | Admitting: Physician Assistant

## 2022-03-09 DIAGNOSIS — I10 Essential (primary) hypertension: Secondary | ICD-10-CM | POA: Diagnosis not present

## 2022-03-10 DIAGNOSIS — N390 Urinary tract infection, site not specified: Secondary | ICD-10-CM | POA: Diagnosis not present

## 2022-03-10 DIAGNOSIS — E871 Hypo-osmolality and hyponatremia: Secondary | ICD-10-CM | POA: Diagnosis not present

## 2022-03-15 DIAGNOSIS — E871 Hypo-osmolality and hyponatremia: Secondary | ICD-10-CM | POA: Diagnosis not present

## 2022-03-30 ENCOUNTER — Ambulatory Visit: Payer: Medicare PPO | Attending: Cardiology | Admitting: Cardiology

## 2022-03-30 ENCOUNTER — Encounter: Payer: Self-pay | Admitting: Cardiology

## 2022-03-30 VITALS — BP 142/80 | HR 81 | Ht 60.0 in | Wt 163.4 lb

## 2022-03-30 DIAGNOSIS — I2510 Atherosclerotic heart disease of native coronary artery without angina pectoris: Secondary | ICD-10-CM

## 2022-03-30 DIAGNOSIS — I4892 Unspecified atrial flutter: Secondary | ICD-10-CM | POA: Diagnosis not present

## 2022-03-30 DIAGNOSIS — E785 Hyperlipidemia, unspecified: Secondary | ICD-10-CM

## 2022-03-30 DIAGNOSIS — I10 Essential (primary) hypertension: Secondary | ICD-10-CM

## 2022-03-30 MED ORDER — ASPIRIN 81 MG PO TBEC: 81.0000 mg | DELAYED_RELEASE_TABLET | Freq: Every day | ORAL | 3 refills | Status: DC

## 2022-03-30 MED ORDER — ATORVASTATIN CALCIUM 80 MG PO TABS: 80.0000 mg | ORAL_TABLET | Freq: Every day | ORAL | 3 refills | Status: DC

## 2022-03-30 NOTE — Patient Instructions (Signed)
Medication Instructions:  START aspirin 81 mg daily STOP rosuvastatin (Crestor) START atorvastatin (Lipitor) 80 mg daily  *If you need a refill on your cardiac medications before your next appointment, please call your pharmacy*   Lab Work: Please return for FASTING labs in 2 months (Lipid)  Our in office lab hours are Monday-Friday 8:00-4:00, closed for lunch 12:45-1:45 pm.  No appointment needed.  [facility] locations:   [facility] - [address]  - [address] ([facility]) - [address] [phone] [address]   [place] - [address]    [place]  - [address]    [place] - [address] - [address] [employer]  - [address] ([facility])  Testing/Procedures: WatchPAT?  Is a FDA cleared portable home sleep study test that uses a watch and 3 points of contact to monitor 7 different channels, including your heart rate, oxygen saturations, body position, snoring, and chest motion.  The study is easy to use from the comfort of your own home and accurately detect sleep apnea.  Before bed, you attach the chest sensor, attached the sleep apnea bracelet to your nondominant hand, and attach the finger probe.  After the study, the raw data is downloaded from the watch and scored for apnea events.   For more information: [email or web address]  Patient Testing Instructions:  Do not put battery into the device until bedtime when you are ready to begin the test. Please call the support number if you need assistance after following the instructions below: 24 hour support line- [phone] or [facility] support at [phone] (option 2)  Download the IntelWatchPAT One" app through the google play store or App Store  Be sure to turn on or enable access to bluetooth in settlings on your smartphone/ device   Make sure no other bluetooth devices are on and within the vicinity of your smartphone/ device and WatchPAT watch during testing.  Make sure to leave your smart phone/ device plugged in and charging all night.  When ready for bed:  Follow the instructions step by step in the WatchPAT One App to activate the testing device. For additional instructions, including video instruction, visit the WatchPAT One video on Youtube. You can search for WatchPat One within Youtube (video is 4 minutes and 18 seconds) or enter: https://youtube/watch?[email or web address]_vbiwxE Please note: You will be prompted to enter a Pin to connect via bluetooth when starting the test. The PIN will be assigned to you when you receive the test.  The device is disposable, but it recommended that you retain the device until you receive a call letting you know the study has been received and the results have been interpreted.  We will let you know if the study did not transmit to [place] properly after the test is completed. You do not need to call us to confirm the receipt of the test.  Please complete the test within 48 hours of receiving PIN.   Frequently Asked Questions:  What is Watch Dennie Bible one?  A single use fully disposable home sleep apnea testing device and will not need to be returned after completion.  What are the requirements to use WatchPAT one?  The be able to have a successful watchpat one sleep study, you should have your Watch pat one device, your smart phone, watch pat one  app, your PIN number and Internet access What type of phone do I need?  You should have a smart phone that uses Android 5.1 and above or any Iphone with IOS 10 and above How can I download the WatchPAT one app?  Based on your device type search for WatchPAT one app either in google play for android devices or APP store for Iphone's Where will I get my PIN for the study?  Your PIN will be provided by your physician's office. It is used for authentication and if  you lose/forget your PIN, please reach out to your providers office.  I do not have Internet at home. Can I do WatchPAT one study?  WatchPAT One needs Internet connection throughout the night to be able to transmit the sleep data. You can use your home/local internet or your cellular's data package. However, it is always recommended to use home/local Internet. It is estimated that between 20MB-30MB will be used with each study.However, the application will be looking for space in the phone to start the study.  What happens if I lose internet or bluetooth connection?  During the internet disconnection, your phone will not be able to transmit the sleep data. All the data, will be stored in your phone. As soon as the internet connection is back on, the phone will being sending the sleep data. During the bluetooth disconnection, WatchPAT one will not be able to to send the sleep data to your phone. Data will be kept in the [facility] one until two devices have bluetooth connection back on. As soon as the connection is back on, WatchPAT one will send the sleep data to the phone.  How long do I need to wear the WatchPAT one?  After you start the study, you should wear the device at least 6 hours.  How far should I keep my phone from the device?  During the night, your phone should be within 15 feet.  What happens if I leave the room for restroom or other reasons?  Leaving the room for any reason will not cause any problem. As soon as your get back to the room, both devices will reconnect and will continue to send the sleep data. Can I use my phone during the sleep study?  Yes, you can use your phone as usual during the study. But it is recommended to put your watchpat one on when you are ready to go to bed.  How will I get my study results?  A soon as you completed your study, your sleep data will be sent to the provider. They will then share the results with you when they are ready.   Follow-Up: At  [facility], you and your health needs are our priority.  As part of our continuing mission to provide you with exceptional heart care, we have created designated Provider Care Teams.  These Care Teams include your primary Cardiologist (physician) and Advanced Practice Providers (APPs -  Physician Assistants and Nurse Practitioners) who all work together to provide you with the care you need, when you need it.  We recommend signing up for the patient portal called "MyChart".  Sign up information is provided on this After Visit Summary.  MyChart is used to connect with patients for Virtual Visits (Telemedicine).  Patients are able to view lab/test results, encounter notes, upcoming appointments, etc.  Non-urgent messages can be sent to your provider as well.   To learn more about what you can do with  MyChart, go to [email or web address].    Your next appointment:   6 month(s)  The format for your next appointment:   In Person  Provider:   Little Ishikawa, MD

## 2022-03-30 NOTE — Progress Notes (Signed)
Cardiology Office Note:    Date:  03/30/2022   ID:  [name], DOB 28-May-1941, MRN [id number]  PCP:  Shon Haleimberlake, Kathryn S, MD  Cardiologist:  Little Ishikawahristopher L Kelin Nixon, MD  Electrophysiologist:  None   Referring MD: Shon Haleimberlake, Kathryn S, *   Chief Complaint  Patient presents with   Atrial Flutter     History of Present Illness:    Ermalinda MemosJudith Deese Cina is a 81 y.o. female with a hx of paroxysmal atrial flutter, hypertension, hypothyroidism, hyperlipidemia, GERD, psoriatic arthritis who presents for follow-up.  He was referred by Dr Chanetta Marshallimberlake for evaluation of dyspnea and palpitations, initially seen on 03/10/2021.  She reports has been having palpitations for the last 3 months.  States that palpitations occur at rest, feels like fluttering in her chest.  Has been occurring daily, last for few minutes.  Denies any lightheadedness or syncope.  Reports feels short of breath during palpitations.  Also has dyspnea when she walks fast.  She denies any chest pain.  Denies any lower extremity edema.  She does not exercise.  No smoking history.  Family history includes father died of MI at age 83.  Zio patch x7 days on 03/25/2021 showed 2% atrial flutter burden, average rate 141 bpm.  Longest episode lasted 15 minutes.  Also with occasional PACs, 2.3% of beats.  Echocardiogram 04/07/2021 showed normal biventricular function, no significant valvular disease.  Calcium score on 04/07/2021 was 604 (81st percentile).  Lexiscan Myoview on 08/10/2021 showed normal perfusion, EF 79%.  Since last clinic visit, she reports that he is doing well.  Reports occasional palpitations, last for short time.  Denies any chest pain, dyspnea, lower extremity edema.  Reports some lightheadedness but denies any syncope.  Reports BP has been 130s over 50s to 60s at home.   BP Readings from Last 3 Encounters:  03/30/22 (!) 142/80  01/12/22 (!) 144/48  01/04/22 (!) 141/85      Past Medical History:  Diagnosis Date    Arthritis    Depression    Dysrhythmia    GERD (gastroesophageal reflux disease)    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   Multiple, noted on US   History of hiatal hernia    Hypertension    Hypothyroidism    Macular degeneration    Psoriatic arthritis (HCC)    Sinus drainage    UTI (urinary tract infection)     Past Surgical History:  Procedure Laterality Date   BACK SURGERY  2000   Dr Newell CoralNudelman   bladder tack  2005   COLONOSCOPY     DILATION AND CURETTAGE OF UTERUS     after miscarriage   RECTAL PROLAPSE REPAIR  2008   SCAR REVISION Right 2017   Knee, Dr. Luiz BlareGraves   TONSILLECTOMY     as a child   TOTAL KNEE ARTHROPLASTY Right 09/05/2014   Procedure: TOTAL KNEE ARTHROPLASTY;  Surgeon: Harvie JuniorJohn L Graves, MD;  Location: [facility];  Service: Orthopedics;  Laterality: Right;   TOTAL KNEE ARTHROPLASTY Left 01/10/2022   Procedure: TOTAL KNEE ARTHROPLASTY;  Surgeon: Jodi GeraldsGraves, John, MD;  Location: [facility];  Service: Orthopedics;  Laterality: Left;   TOTAL SHOULDER ARTHROPLASTY Left 11/17/2017   Procedure: LEFT TOTAL SHOULDER ARTHROPLASTY;  Surgeon: Jodi GeraldsGraves, John, MD;  Location: [facility];  Service: Orthopedics;  Laterality: Left;  with block   TUBAL LIGATION  1972    Current Medications: Current Meds  Medication Sig   amLODipine (NORVASC) 5 MG tablet Take 5 mg by mouth daily.  aspirin EC 81 MG tablet Take 1 tablet (81 mg total) by mouth daily. Swallow whole.   atorvastatin (LIPITOR) 80 MG tablet Take 1 tablet (80 mg total) by mouth daily.   carvedilol (COREG) 6.25 MG tablet Take 1 tablet (6.25 mg total) by mouth 2 (two) times daily.   irbesartan (AVAPRO) 150 MG tablet Take 150 mg by mouth daily.   levothyroxine (SYNTHROID) 75 MCG tablet Take 75 mcg by mouth daily before breakfast.   sertraline (ZOLOFT) 100 MG tablet Take 100 mg by mouth daily.   [DISCONTINUED] Carboxymethylcellulose Sodium (THERATEARS OP) Place 1 drop into both eyes 2 (two) times daily as needed (for dry eyes).   [DISCONTINUED] levothyroxine  (SYNTHROID) 88 MCG tablet Take 88 mcg by mouth daily before breakfast.   [DISCONTINUED] tiZANidine (ZANAFLEX) 2 MG tablet Take 1 tablet (2 mg total) by mouth every 6 (six) hours as needed.     Allergies:   Penicillins and Sulfa antibiotics   Social History   Socioeconomic History   Marital status: Married    Spouse name: Not on file   Number of children: Not on file   Years of education: Not on file   Highest education level: Not on file  Occupational History   Not on file  Tobacco Use   Smoking status: Never   Smokeless tobacco: Never  Vaping Use   Vaping Use: Never used  Substance and Sexual Activity   Alcohol use: Yes    Comment: rarely wine   Drug use: No   Sexual activity: Not on file  Other Topics Concern   Not on file  Social History Narrative   Not on file   Social Determinants of Health   Financial Resource Strain: Not on file  Food Insecurity: Not on file  Transportation Needs: Not on file  Physical Activity: Not on file  Stress: Not on file  Social Connections: Not on file     Family History: The patient's family history is negative for Breast cancer.  ROS:   Please see the history of present illness.     All other systems reviewed and are negative.  EKGs/Labs/Other Studies Reviewed:    The following studies were reviewed today:   EKG:  EKG is  ordered today.  The ekg ordered today demonstrates normal sinus rhythm, rate 60, low voltage, right bundle branch block, left anterior fascicular block, Q waves in leads III, aVF  Recent Labs: 04/05/2021: ALT 11; TSH 0.798 01/04/2022: BUN 25; Creatinine, Ser 0.87; Hemoglobin 12.7; Platelets 375; Potassium 5.0; Sodium 134  Recent Lipid Panel    Component Value Date/Time   CHOL 151 07/27/2021 1527   TRIG 152 (H) 07/27/2021 1527   HDL 50 07/27/2021 1527   CHOLHDL 3.0 07/27/2021 1527   LDLCALC 75 07/27/2021 1527    Physical Exam:    VS:  BP (!) 142/80   Pulse 81   Ht 5' (1.524 m)   Wt 163 lb 6.4 oz  (74.1 kg)   SpO2 99%   BMI 31.91 kg/m     Wt Readings from Last 3 Encounters:  03/30/22 163 lb 6.4 oz (74.1 kg)  01/10/22 162 lb (73.5 kg)  01/04/22 162 lb (73.5 kg)     GEN:  Well nourished, well developed in no acute distress HEENT: Normal NECK: No JVD; No carotid bruits LYMPHATICS: No lymphadenopathy CARDIAC: RRR, no murmurs, rubs, gallops RESPIRATORY:  Clear to auscultation without rales, wheezing or rhonchi  ABDOMEN: Soft, non-tender, non-distended MUSCULOSKELETAL:  No edema; No  deformity  SKIN: Warm and dry NEUROLOGIC:  Alert and oriented x 3 PSYCHIATRIC:  Normal affect   ASSESSMENT:    1. Atrial flutter, unspecified type (HCC)   2. CAD in native artery   3. Essential hypertension, benign   4. Hyperlipidemia, unspecified hyperlipidemia type      PLAN:    Atrial flutter: Zio patch x7 days on 03/25/2021 showed 2% atrial flutter burden, average rate 141 bpm.  Longest episode lasted 15 minutes.  CHA2DS2-VASc score 4 (hypertension, age x2, female).  No history of bleeding issues.  Echocardiogram 04/07/2021 showed normal biventricular function, no significant valvular disease. -She declines anticoagulation.  Discussed risk of stroke but she declines to start anticoagulation -Continue Coreg 6.25 mg twice daily -Itamar sleep study  CAD: Calcium score on 04/07/2021 was 604 (81st percentile).  Having dyspnea.  Lexiscan Myoview on 08/10/2021 showed normal perfusion, EF 79%. -Aspirin 81 mg daily -Atorvastatin 80 mg  Hypertension: On irbesartan 150 mg daily and amlodipine 5mg  daily and Coreg 6.25 mg twice daily.  Mildly elevated in clinic today but reports under control at home  Hyperlipidemia: On lovastatin 80 mg daily LDL 115 on 08/03/2020.  Calcium score on 04/07/2021 was 604 (81st percentile).  Switched to rosuvastatin 10 mg daily 03/2021.  LDL 152 on 02/14/2022 -Switch from rosuvastatin to atorvastatin 80 mg daily.  Check fasting lipid panel in 2 months   RTC in 6  months    Medication Adjustments/Labs and Tests Ordered: Current medicines are reviewed at length with the patient today.  Concerns regarding medicines are outlined above.  Orders Placed This Encounter  Procedures   Lipid panel   Itamar Sleep Study     Meds ordered this encounter  Medications   atorvastatin (LIPITOR) 80 MG tablet    Sig: Take 1 tablet (80 mg total) by mouth daily.    Dispense:  90 tablet    Refill:  3    STOP rosuvastatin   aspirin EC 81 MG tablet    Sig: Take 1 tablet (81 mg total) by mouth daily. Swallow whole.    Dispense:  90 tablet    Refill:  3     Patient Instructions  Medication Instructions:  START aspirin 81 mg daily STOP rosuvastatin (Crestor) START atorvastatin (Lipitor) 80 mg daily  *If you need a refill on your cardiac medications before your next appointment, please call your pharmacy*   Lab Work: Please return for FASTING labs in 2 months (Lipid)  Our in office lab hours are Monday-Friday 8:00-4:00, closed for lunch 12:45-1:45 pm.  No appointment needed.  [facility] locations:   02/16/2022 - [address]  - [address] ([facility]) - [address] [phone] [address]   [place] - [address]    [place]  - [address]    [place] - [address] [employer]  - [address] [place] [address] - [address] ([facility])  Testing/Procedures: WatchPAT?  Is a FDA cleared portable home sleep study test that uses a watch and 3 points of contact to monitor 7 different channels, including your heart rate, oxygen saturations, body position, snoring, and chest motion.  The study is easy to use from the comfort of your own home and accurately detect sleep apnea.  Before bed, you attach the chest sensor, attached the sleep apnea bracelet to  your nondominant hand, and attach the finger probe.   After the study, the raw data is downloaded from the watch and scored for apnea events.   For more information: [email or web address]  Patient Testing Instructions:  Do not put battery into the device until bedtime when you are ready to begin the test. Please call the support number if you need assistance after following the instructions below: 24 hour support line- [phone] or ITAMAR support at [phone] (option 2)  Download the IntelWatchPAT One" app through the google play store or App Store  Be sure to turn on or enable access to bluetooth in settlings on your smartphone/ device  Make sure no other bluetooth devices are on and within the vicinity of your smartphone/ device and WatchPAT watch during testing.  Make sure to leave your smart phone/ device plugged in and charging all night.  When ready for bed:  Follow the instructions step by step in the WatchPAT One App to activate the testing device. For additional instructions, including video instruction, visit the WatchPAT One video on Youtube. You can search for WatchPat One within Youtube (video is 4 minutes and 18 seconds) or enter: https://youtube/watch?v=BCce_vbiwxE Please note: You will be prompted to enter a Pin to connect via bluetooth when starting the test. The PIN will be assigned to you when you receive the test.  The device is disposable, but it recommended that you retain the device until you receive a call letting you know the study has been received and the results have been interpreted.  We will let you know if the study did not transmit to [place] properly after the test is completed. You do not need to call us to confirm the receipt of the test.  Please complete the test within 48 hours of receiving PIN.   Frequently Asked Questions:  What is Watch Dennie Bible one?  A single use fully disposable home sleep apnea testing device and will not need to be returned after completion.  What are the requirements to  use WatchPAT one?  The be able to have a successful watchpat one sleep study, you should have your Watch pat one device, your smart phone, watch pat one app, your PIN number and Internet access What type of phone do I need?  You should have a smart phone that uses Android 5.1 and above or any Iphone with IOS 10 and above How can I download the WatchPAT one app?  Based on your device type search for WatchPAT one app either in google play for android devices or APP store for Iphone's Where will I get my PIN for the study?  Your PIN will be provided by your physician's office. It is used for authentication and if you lose/forget your PIN, please reach out to your providers office.  I do not have Internet at home. Can I do WatchPAT one study?  WatchPAT One needs Internet connection throughout the night to be able to transmit the sleep data. You can use your home/local internet or your cellular's data package. However, it is always recommended to use home/local Internet. It is estimated that between 20MB-30MB will be used with each study.However, the application will be looking for space in the phone to start the study.  What happens if I lose internet or bluetooth connection?  During the internet disconnection, your phone will not be able to transmit the sleep data. All the data, will be stored in your phone. As soon as the internet connection is back  on, the phone will being sending the sleep data. During the bluetooth disconnection, WatchPAT one will not be able to to send the sleep data to your phone. Data will be kept in the [facility] one until two devices have bluetooth connection back on. As soon as the connection is back on, WatchPAT one will send the sleep data to the phone.  How long do I need to wear the WatchPAT one?  After you start the study, you should wear the device at least 6 hours.  How far should I keep my phone from the device?  During the night, your phone should be within 15  feet.  What happens if I leave the room for restroom or other reasons?  Leaving the room for any reason will not cause any problem. As soon as your get back to the room, both devices will reconnect and will continue to send the sleep data. Can I use my phone during the sleep study?  Yes, you can use your phone as usual during the study. But it is recommended to put your watchpat one on when you are ready to go to bed.  How will I get my study results?  A soon as you completed your study, your sleep data will be sent to the provider. They will then share the results with you when they are ready.   Follow-Up: At [facility], you and your health needs are our priority.  As part of our continuing mission to provide you with exceptional heart care, we have created designated Provider Care Teams.  These Care Teams include your primary Cardiologist (physician) and Advanced Practice Providers (APPs -  Physician Assistants and Nurse Practitioners) who all work together to provide you with the care you need, when you need it.  We recommend signing up for the patient portal called "MyChart".  Sign up information is provided on this After Visit Summary.  MyChart is used to connect with patients for Virtual Visits (Telemedicine).  Patients are able to view lab/test results, encounter notes, upcoming appointments, etc.  Non-urgent messages can be sent to your provider as well.   To learn more about what you can do with MyChart, go to [email or web address].    Your next appointment:   6 month(s)  The format for your next appointment:   In Person  Provider:   Little Ishikawa, MD            Signed, Little Ishikawa, MD  03/30/2022 6:10 PM    [facility]

## 2022-03-31 ENCOUNTER — Telehealth: Payer: Self-pay

## 2022-03-31 ENCOUNTER — Telehealth: Payer: Self-pay | Admitting: *Deleted

## 2022-03-31 NOTE — Telephone Encounter (Signed)
Patient's husband returned call left by Johney Frame, RN. I explained what was the reason for call and he got his wife on the telephone. I asked the STOP Bang questions. Score is a 4. I did an addendum to the office note from 03/30/22 to include the score. Will route encounter to Dr. Bjorn Pippin and his nurse Johney Frame, RN

## 2022-03-31 NOTE — Telephone Encounter (Signed)
Called and made the patient aware that She may proceed with the Itamar Home Sleep Study. PIN # provided to the patient. Patient made aware that She will be contacted after the test has been read with the results and any recommendations. Patient verbalized understanding and thanked me for the call.   

## 2022-03-31 NOTE — Telephone Encounter (Signed)
Staff message sent to Stacy Green ok to activate itamar device.  

## 2022-04-03 ENCOUNTER — Encounter (HOSPITAL_BASED_OUTPATIENT_CLINIC_OR_DEPARTMENT_OTHER): Payer: Medicare PPO | Admitting: Cardiology

## 2022-04-03 DIAGNOSIS — I4892 Unspecified atrial flutter: Secondary | ICD-10-CM | POA: Diagnosis not present

## 2022-04-03 DIAGNOSIS — I10 Essential (primary) hypertension: Secondary | ICD-10-CM | POA: Diagnosis not present

## 2022-04-03 DIAGNOSIS — R0683 Snoring: Secondary | ICD-10-CM

## 2022-04-03 DIAGNOSIS — I2510 Atherosclerotic heart disease of native coronary artery without angina pectoris: Secondary | ICD-10-CM

## 2022-04-04 NOTE — Procedures (Signed)
   Patient Information Study Date: 04/03/22 Patient Name: Connie [name] Patient ID: [id number] Birth Date: November 14, 2040 Age: 81 Gender: Female BMI: 32.0 (W=163 lb, H=5' 0'') Referring Physician: Epifanio Lesches, MD  TEST DESCRIPTION: Home sleep apnea testing was completed using the WatchPat, a Type 1 device, utilizing peripheral arterial tonometry (PAT), chest movement, actigraphy, pulse oximetry, pulse rate, body position and snore. AHI was calculated with apnea and hypopnea using valid sleep time as the denominator. RDI includes apneas, hypopneas, and RERAs. The data acquired and the scoring of sleep and all associated events were performed in accordance with the recommended standards and specifications as outlined in the AASM Manual for the Scoring of Sleep and Associated Events 2.2.0 (2015).   FINDINGS: 1.  No evidence of Obstructive Sleep Apnea with AHI 4.9/hr.  2.  No Central Sleep Apnea. 3.  Oxygen desaturations as low as 83%. 4.  Severe snoring was present. O2 sats were < 88% for 0 minutes. 5.  Total sleep time was 6 hrs and 57 min. 6.  12.7% of total sleep time was spent in REM sleep.  7.  Normal sleep onset latency at 17 min.  8.  Prolonged REM sleep onset latency at 194 min.  9.  Total awakenings were 12.   DIAGNOSIS:  Normal study with no significant sleep disordered breathing.  RECOMMENDATIONS:   1. Normal study with no significant sleep disordered breathing.  2.  Healthy sleep recommendations include:  adequate nightly sleep (normal 7-9 hrs/night), avoidance of caffeine after noon and alcohol near bedtime, and maintaining a sleep environment that is cool, dark and quiet.  3.  Weight loss for overweight patients is recommended.    4.  Snoring recommendations include:  weight loss where appropriate, side sleeping, and avoidance of alcohol before bed.  5.  Operation of motor vehicle or dangerous equipment must be avoided when feeling drowsy, excessively sleepy, or  mentally fatigued.    6.  An ENT consultation which may be useful for specific causes of and possible treatment of bothersome snoring.   7. Weight loss may be of benefit in reducing the severity of snoring.   Signature: Armanda Magic, MD; [facility]; Diplomat, American Board of Sleep Medicine Electronically Signed: 04/04/22

## 2022-04-07 ENCOUNTER — Other Ambulatory Visit (HOSPITAL_COMMUNITY): Payer: Medicare PPO

## 2022-04-07 ENCOUNTER — Encounter (HOSPITAL_COMMUNITY): Payer: Self-pay | Admitting: Cardiovascular Disease

## 2022-04-07 ENCOUNTER — Other Ambulatory Visit (HOSPITAL_COMMUNITY): Payer: Self-pay

## 2022-04-07 ENCOUNTER — Inpatient Hospital Stay (HOSPITAL_COMMUNITY)
Admission: EM | Admit: 2022-04-07 | Discharge: 2022-04-14 | DRG: 246 | Disposition: A | Discharge status: Home / Self Care | Payer: Medicare PPO | Attending: Cardiovascular Disease | Admitting: Cardiovascular Disease

## 2022-04-07 ENCOUNTER — Telehealth (HOSPITAL_COMMUNITY): Payer: Self-pay | Admitting: Pharmacy Technician

## 2022-04-07 ENCOUNTER — Encounter (HOSPITAL_COMMUNITY)
Admission: EM | Disposition: A | Discharge status: Home / Self Care | Payer: Self-pay | Source: Home / Self Care | Attending: Cardiovascular Disease

## 2022-04-07 ENCOUNTER — Ambulatory Visit (HOSPITAL_COMMUNITY): Admit: 2022-04-07 | Payer: Medicare PPO | Admitting: Cardiovascular Disease

## 2022-04-07 ENCOUNTER — Encounter (HOSPITAL_COMMUNITY): Payer: Self-pay

## 2022-04-07 ENCOUNTER — Inpatient Hospital Stay (HOSPITAL_COMMUNITY): Payer: Medicare PPO

## 2022-04-07 DIAGNOSIS — I2582 Chronic total occlusion of coronary artery: Secondary | ICD-10-CM | POA: Diagnosis not present

## 2022-04-07 DIAGNOSIS — I97790 Other intraoperative cardiac functional disturbances during cardiac surgery: Secondary | ICD-10-CM | POA: Diagnosis not present

## 2022-04-07 DIAGNOSIS — R0789 Other chest pain: Secondary | ICD-10-CM | POA: Diagnosis not present

## 2022-04-07 DIAGNOSIS — I2510 Atherosclerotic heart disease of native coronary artery without angina pectoris: Secondary | ICD-10-CM | POA: Diagnosis not present

## 2022-04-07 DIAGNOSIS — Z882 Allergy status to sulfonamides status: Secondary | ICD-10-CM

## 2022-04-07 DIAGNOSIS — I2102 ST elevation (STEMI) myocardial infarction involving left anterior descending coronary artery: Secondary | ICD-10-CM | POA: Diagnosis not present

## 2022-04-07 DIAGNOSIS — I2109 ST elevation (STEMI) myocardial infarction involving other coronary artery of anterior wall: Secondary | ICD-10-CM | POA: Diagnosis not present

## 2022-04-07 DIAGNOSIS — Z96612 Presence of left artificial shoulder joint: Secondary | ICD-10-CM | POA: Diagnosis present

## 2022-04-07 DIAGNOSIS — Z96653 Presence of artificial knee joint, bilateral: Secondary | ICD-10-CM | POA: Diagnosis present

## 2022-04-07 DIAGNOSIS — Z7901 Long term (current) use of anticoagulants: Secondary | ICD-10-CM | POA: Diagnosis not present

## 2022-04-07 DIAGNOSIS — Z8249 Family history of ischemic heart disease and other diseases of the circulatory system: Secondary | ICD-10-CM | POA: Diagnosis not present

## 2022-04-07 DIAGNOSIS — I480 Paroxysmal atrial fibrillation: Secondary | ICD-10-CM | POA: Diagnosis not present

## 2022-04-07 DIAGNOSIS — Z7982 Long term (current) use of aspirin: Secondary | ICD-10-CM

## 2022-04-07 DIAGNOSIS — I255 Ischemic cardiomyopathy: Secondary | ICD-10-CM

## 2022-04-07 DIAGNOSIS — I4892 Unspecified atrial flutter: Secondary | ICD-10-CM | POA: Diagnosis present

## 2022-04-07 DIAGNOSIS — Z79899 Other long term (current) drug therapy: Secondary | ICD-10-CM | POA: Diagnosis not present

## 2022-04-07 DIAGNOSIS — Z91148 Patient's other noncompliance with medication regimen for other reason: Secondary | ICD-10-CM

## 2022-04-07 DIAGNOSIS — Z881 Allergy status to other antibiotic agents status: Secondary | ICD-10-CM

## 2022-04-07 DIAGNOSIS — F32A Depression, unspecified: Secondary | ICD-10-CM | POA: Diagnosis present

## 2022-04-07 DIAGNOSIS — Z79890 Hormone replacement therapy (postmenopausal): Secondary | ICD-10-CM

## 2022-04-07 DIAGNOSIS — Z955 Presence of coronary angioplasty implant and graft: Secondary | ICD-10-CM | POA: Diagnosis not present

## 2022-04-07 DIAGNOSIS — R231 Pallor: Secondary | ICD-10-CM | POA: Diagnosis not present

## 2022-04-07 DIAGNOSIS — Z86711 Personal history of pulmonary embolism: Secondary | ICD-10-CM

## 2022-04-07 DIAGNOSIS — R079 Chest pain, unspecified: Secondary | ICD-10-CM

## 2022-04-07 DIAGNOSIS — I495 Sick sinus syndrome: Secondary | ICD-10-CM | POA: Diagnosis not present

## 2022-04-07 DIAGNOSIS — I5021 Acute systolic (congestive) heart failure: Secondary | ICD-10-CM | POA: Diagnosis present

## 2022-04-07 DIAGNOSIS — Z880 Allergy status to penicillin: Secondary | ICD-10-CM

## 2022-04-07 DIAGNOSIS — I110 Hypertensive heart disease with heart failure: Secondary | ICD-10-CM | POA: Diagnosis present

## 2022-04-07 DIAGNOSIS — I10 Essential (primary) hypertension: Secondary | ICD-10-CM

## 2022-04-07 DIAGNOSIS — E785 Hyperlipidemia, unspecified: Secondary | ICD-10-CM | POA: Diagnosis not present

## 2022-04-07 DIAGNOSIS — R0902 Hypoxemia: Secondary | ICD-10-CM | POA: Diagnosis not present

## 2022-04-07 DIAGNOSIS — E039 Hypothyroidism, unspecified: Secondary | ICD-10-CM

## 2022-04-07 DIAGNOSIS — R42 Dizziness and giddiness: Secondary | ICD-10-CM | POA: Diagnosis not present

## 2022-04-07 HISTORY — PX: LEFT HEART CATH AND CORONARY ANGIOGRAPHY: CATH118249

## 2022-04-07 LAB — CBC
HCT: 34.9 % — ABNORMAL LOW (ref 36.0–46.0)
HCT: 38.3 % (ref 36.0–46.0)
Hemoglobin: 11.4 g/dL — ABNORMAL LOW (ref 12.0–15.0)
Hemoglobin: 12.8 g/dL (ref 12.0–15.0)
MCH: 28.1 pg (ref 26.0–34.0)
MCH: 28.8 pg (ref 26.0–34.0)
MCHC: 32.7 g/dL (ref 30.0–36.0)
MCHC: 33.4 g/dL (ref 30.0–36.0)
MCV: 86 fL (ref 80.0–100.0)
MCV: 86.3 fL (ref 80.0–100.0)
Platelets: 308 10*3/uL (ref 150–400)
Platelets: 283 10*3/uL (ref 150–400)
RBC: 4.06 MIL/uL (ref 3.87–5.11)
RBC: 4.44 MIL/uL (ref 3.87–5.11)
RDW: 13.4 % (ref 11.5–15.5)
RDW: 13.3 % (ref 11.5–15.5)
WBC: 10.5 10*3/uL (ref 4.0–10.5)
WBC: 15.9 10*3/uL — ABNORMAL HIGH (ref 4.0–10.5)
nRBC: 0 % (ref 0.0–0.2)
nRBC: 0 % (ref 0.0–0.2)

## 2022-04-07 LAB — LIPID PANEL
Cholesterol: 125 mg/dL (ref 0–200)
HDL: 50 mg/dL (ref >40)
LDL Cholesterol: 54 mg/dL (ref 0–99)
Total CHOL/HDL Ratio: 2.5 RATIO
Triglycerides: 103 mg/dL (ref <150)
VLDL: 21 mg/dL (ref 0–40)

## 2022-04-07 LAB — COMPREHENSIVE METABOLIC PANEL
ALT: 16 U/L (ref 0–44)
AST: 25 U/L (ref 15–41)
Albumin: 3.5 g/dL (ref 3.5–5.0)
Alkaline Phosphatase: 99 U/L (ref 38–126)
Anion gap: 7 (ref 5–15)
BUN: 15 mg/dL (ref 8–23)
CO2: 22 mmol/L (ref 22–32)
Calcium: 8.8 mg/dL — ABNORMAL LOW (ref 8.9–10.3)
Chloride: 105 mmol/L (ref 98–111)
Creatinine, Ser: 0.73 mg/dL (ref 0.44–1.00)
GFR, Estimated: >60 mL/min (ref >60)
Glucose, Bld: 143 mg/dL — ABNORMAL HIGH (ref 70–99)
Potassium: 3.6 mmol/L (ref 3.5–5.1)
Sodium: 134 mmol/L — ABNORMAL LOW (ref 135–145)
Total Bilirubin: 0.8 mg/dL (ref 0.3–1.2)
Total Protein: 5.8 g/dL — ABNORMAL LOW (ref 6.5–8.1)

## 2022-04-07 LAB — POCT I-STAT, CHEM 8
BUN: 16 mg/dL (ref 8–23)
Calcium, Ion: 1.29 mmol/L (ref 1.15–1.40)
Chloride: 103 mmol/L (ref 98–111)
Creatinine, Ser: 0.7 mg/dL (ref 0.44–1.00)
Glucose, Bld: 124 mg/dL — ABNORMAL HIGH (ref 70–99)
HCT: 41 % (ref 36.0–46.0)
Hemoglobin: 13.9 g/dL (ref 12.0–15.0)
Potassium: 4.4 mmol/L (ref 3.5–5.1)
Sodium: 137 mmol/L (ref 135–145)
TCO2: 23 mmol/L (ref 22–32)

## 2022-04-07 LAB — TROPONIN I (HIGH SENSITIVITY)
Troponin I (High Sensitivity): 895 ng/L (ref <18)
Troponin I (High Sensitivity): >24000 ng/L (ref <18)

## 2022-04-07 LAB — ECHOCARDIOGRAM COMPLETE
Area-P 1/2: 1.76 cm2
S' Lateral: 2.4 cm

## 2022-04-07 LAB — PROTIME-INR
INR: 1.5 — ABNORMAL HIGH (ref 0.8–1.2)
Prothrombin Time: 18 seconds — ABNORMAL HIGH (ref 11.4–15.2)

## 2022-04-07 LAB — POCT ACTIVATED CLOTTING TIME
Activated Clotting Time: 257 seconds
Activated Clotting Time: 287 seconds

## 2022-04-07 LAB — HEMOGLOBIN A1C
Hgb A1c MFr Bld: 5.6 % (ref 4.8–5.6)
Mean Plasma Glucose: 114.02 mg/dL

## 2022-04-07 LAB — BRAIN NATRIURETIC PEPTIDE: B Natriuretic Peptide: 126.5 pg/mL — ABNORMAL HIGH (ref 0.0–100.0)

## 2022-04-07 SURGERY — LEFT HEART CATH AND CORONARY ANGIOGRAPHY
Anesthesia: LOCAL

## 2022-04-07 MED ORDER — CANGRELOR BOLUS VIA INFUSION
INTRAVENOUS | Status: DC | PRN
  Administered 2022-04-07: 2223 ug via INTRAVENOUS

## 2022-04-07 MED ORDER — HEPARIN (PORCINE) IN NACL 1000-0.9 UT/500ML-% IV SOLN
INTRAVENOUS | Status: DC | PRN
  Administered 2022-04-07 (×2): 500 mL

## 2022-04-07 MED ORDER — SODIUM CHLORIDE 0.9% FLUSH
3.0000 mL | Freq: Two times a day (BID) | INTRAVENOUS | Status: DC
  Administered 2022-04-07 – 2022-04-10 (×7): 3 mL via INTRAVENOUS

## 2022-04-07 MED ORDER — VERAPAMIL HCL 2.5 MG/ML IV SOLN
INTRAVENOUS | Status: AC
  Filled 2022-04-07: qty 2

## 2022-04-07 MED ORDER — FENTANYL CITRATE (PF) 100 MCG/2ML IJ SOLN
INTRAMUSCULAR | Status: DC | PRN
  Administered 2022-04-07: 25 ug via INTRAVENOUS

## 2022-04-07 MED ORDER — SERTRALINE HCL 100 MG PO TABS
100.0000 mg | ORAL_TABLET | Freq: Every day | ORAL | Status: DC
  Administered 2022-04-07 – 2022-04-14 (×7): 100 mg via ORAL
  Filled 2022-04-07 (×7): qty 1

## 2022-04-07 MED ORDER — SODIUM CHLORIDE 0.9 % IV SOLN
INTRAVENOUS | Status: AC | PRN
  Administered 2022-04-07: 4 ug/kg/min via INTRAVENOUS

## 2022-04-07 MED ORDER — HEPARIN SODIUM (PORCINE) 5000 UNIT/ML IJ SOLN
5000.0000 [IU] | Freq: Three times a day (TID) | INTRAMUSCULAR | Status: DC
  Administered 2022-04-08 – 2022-04-09 (×4): 5000 [IU] via SUBCUTANEOUS
  Filled 2022-04-07 (×5): qty 1

## 2022-04-07 MED ORDER — LIDOCAINE HCL (PF) 1 % IJ SOLN
INTRAMUSCULAR | Status: AC
  Filled 2022-04-07: qty 30

## 2022-04-07 MED ORDER — MIDAZOLAM HCL 2 MG/2ML IJ SOLN
INTRAMUSCULAR | Status: DC | PRN
  Administered 2022-04-07: 1 mg via INTRAVENOUS

## 2022-04-07 MED ORDER — LEVOTHYROXINE SODIUM 75 MCG PO TABS
75.0000 ug | ORAL_TABLET | Freq: Every day | ORAL | Status: DC
  Administered 2022-04-08 – 2022-04-14 (×6): 75 ug via ORAL
  Filled 2022-04-07 (×7): qty 1

## 2022-04-07 MED ORDER — SODIUM CHLORIDE 0.9% FLUSH: 3.0000 mL | INTRAVENOUS | Status: DC | PRN

## 2022-04-07 MED ORDER — MORPHINE SULFATE (PF) 2 MG/ML IV SOLN
1.0000 mg | INTRAVENOUS | Status: DC | PRN
  Administered 2022-04-07 – 2022-04-08 (×2): 2 mg via INTRAVENOUS
  Filled 2022-04-07 (×2): qty 1

## 2022-04-07 MED ORDER — TICAGRELOR 90 MG PO TABS
ORAL_TABLET | ORAL | Status: DC | PRN
  Administered 2022-04-07: 180 mg via ORAL

## 2022-04-07 MED ORDER — IOHEXOL 350 MG/ML SOLN
INTRAVENOUS | Status: DC | PRN
  Administered 2022-04-07: 125 mL

## 2022-04-07 MED ORDER — ACETAMINOPHEN 325 MG PO TABS
650.0000 mg | ORAL_TABLET | ORAL | Status: DC | PRN
  Administered 2022-04-07 – 2022-04-09 (×5): 650 mg via ORAL
  Filled 2022-04-07 (×4): qty 2

## 2022-04-07 MED ORDER — VERAPAMIL HCL 2.5 MG/ML IV SOLN
INTRAVENOUS | Status: DC | PRN
  Administered 2022-04-07 (×5): 200 ug via INTRACORONARY

## 2022-04-07 MED ORDER — FENTANYL CITRATE (PF) 100 MCG/2ML IJ SOLN
INTRAMUSCULAR | Status: AC
  Filled 2022-04-07: qty 2

## 2022-04-07 MED ORDER — LIDOCAINE HCL (PF) 1 % IJ SOLN
INTRAMUSCULAR | Status: DC | PRN
  Administered 2022-04-07: 2 mL

## 2022-04-07 MED ORDER — CHLORHEXIDINE GLUCONATE CLOTH 2 % EX PADS
6.0000 | MEDICATED_PAD | Freq: Every day | CUTANEOUS | Status: DC
  Administered 2022-04-07 – 2022-04-12 (×7): 6 via TOPICAL

## 2022-04-07 MED ORDER — ONDANSETRON HCL 4 MG/2ML IJ SOLN
4.0000 mg | Freq: Four times a day (QID) | INTRAMUSCULAR | Status: DC | PRN
  Administered 2022-04-07: 4 mg via INTRAVENOUS
  Filled 2022-04-07: qty 2

## 2022-04-07 MED ORDER — NITROGLYCERIN 0.4 MG SL SUBL
0.4000 mg | SUBLINGUAL_TABLET | SUBLINGUAL | Status: DC | PRN
  Administered 2022-04-07: 0.4 mg via SUBLINGUAL
  Filled 2022-04-07: qty 1

## 2022-04-07 MED ORDER — VERAPAMIL HCL 2.5 MG/ML IV SOLN
INTRAVENOUS | Status: DC | PRN
  Administered 2022-04-07: 10 mL via INTRA_ARTERIAL

## 2022-04-07 MED ORDER — NITROGLYCERIN 1 MG/10 ML FOR IR/CATH LAB
INTRA_ARTERIAL | Status: AC
  Filled 2022-04-07: qty 10

## 2022-04-07 MED ORDER — TICAGRELOR 90 MG PO TABS
90.0000 mg | ORAL_TABLET | Freq: Two times a day (BID) | ORAL | Status: DC
  Administered 2022-04-07 – 2022-04-09 (×4): 90 mg via ORAL
  Filled 2022-04-07 (×4): qty 1

## 2022-04-07 MED ORDER — HEPARIN SODIUM (PORCINE) 1000 UNIT/ML IJ SOLN
INTRAMUSCULAR | Status: AC
  Filled 2022-04-07: qty 10

## 2022-04-07 MED ORDER — SODIUM CHLORIDE 0.9 % IV SOLN: 250.0000 mL | INTRAVENOUS | Status: DC | PRN

## 2022-04-07 MED ORDER — HEPARIN (PORCINE) IN NACL 1000-0.9 UT/500ML-% IV SOLN
INTRAVENOUS | Status: AC
  Filled 2022-04-07: qty 1000

## 2022-04-07 MED ORDER — HEPARIN SODIUM (PORCINE) 1000 UNIT/ML IJ SOLN
INTRAMUSCULAR | Status: DC | PRN
  Administered 2022-04-07: 7000 [IU] via INTRAVENOUS
  Administered 2022-04-07: 3000 [IU] via INTRAVENOUS

## 2022-04-07 MED ORDER — ORAL CARE MOUTH RINSE: 15.0000 mL | OROMUCOSAL | Status: DC | PRN

## 2022-04-07 MED ORDER — MIDAZOLAM HCL 2 MG/2ML IJ SOLN
INTRAMUSCULAR | Status: AC
  Filled 2022-04-07: qty 2

## 2022-04-07 MED ORDER — LABETALOL HCL 5 MG/ML IV SOLN: 10.0000 mg | INTRAVENOUS | Status: AC | PRN

## 2022-04-07 MED ORDER — PERFLUTREN LIPID MICROSPHERE
INTRAVENOUS | Status: AC
  Administered 2022-04-07: 4 mL via INTRAVENOUS
  Filled 2022-04-07: qty 10

## 2022-04-07 MED ORDER — SODIUM CHLORIDE 0.9 % IV SOLN
4.0000 ug/kg/min | INTRAVENOUS | Status: AC
  Filled 2022-04-07: qty 50

## 2022-04-07 MED ORDER — HEPARIN SODIUM (PORCINE) 5000 UNIT/ML IJ SOLN: 5000.0000 [IU] | Freq: Three times a day (TID) | INTRAMUSCULAR | Status: DC

## 2022-04-07 MED ORDER — POTASSIUM CHLORIDE CRYS ER 20 MEQ PO TBCR
40.0000 meq | EXTENDED_RELEASE_TABLET | Freq: Once | ORAL | Status: AC
  Administered 2022-04-07: 40 meq via ORAL
  Filled 2022-04-07: qty 2

## 2022-04-07 MED ORDER — NITROGLYCERIN 1 MG/10 ML FOR IR/CATH LAB
INTRA_ARTERIAL | Status: DC | PRN
  Administered 2022-04-07: 200 ug

## 2022-04-07 MED ORDER — PERFLUTREN LIPID MICROSPHERE: 1.0000 mL | INTRAVENOUS | Status: AC | PRN

## 2022-04-07 MED ORDER — HYDRALAZINE HCL 20 MG/ML IJ SOLN: 10.0000 mg | INTRAMUSCULAR | Status: AC | PRN

## 2022-04-07 MED ORDER — OXYCODONE HCL 5 MG PO TABS
5.0000 mg | ORAL_TABLET | ORAL | Status: DC | PRN
  Administered 2022-04-07: 5 mg via ORAL
  Filled 2022-04-07: qty 1

## 2022-04-07 MED ORDER — ATORVASTATIN CALCIUM 80 MG PO TABS
80.0000 mg | ORAL_TABLET | Freq: Every day | ORAL | Status: DC
  Administered 2022-04-07 – 2022-04-14 (×7): 80 mg via ORAL
  Filled 2022-04-07 (×8): qty 1

## 2022-04-07 MED ORDER — CANGRELOR TETRASODIUM 50 MG IV SOLR
INTRAVENOUS | Status: AC
  Filled 2022-04-07: qty 50

## 2022-04-07 MED ORDER — ASPIRIN 81 MG PO TBEC
81.0000 mg | DELAYED_RELEASE_TABLET | Freq: Every day | ORAL | Status: DC
  Administered 2022-04-07 – 2022-04-14 (×7): 81 mg via ORAL
  Filled 2022-04-07 (×7): qty 1

## 2022-04-07 MED ORDER — TICAGRELOR 90 MG PO TABS
ORAL_TABLET | ORAL | Status: AC
Start: 2022-04-07 — End: ?
  Filled 2022-04-07: qty 2

## 2022-04-07 SURGICAL SUPPLY — 21 items
BALLN SAPPHIRE 2.5X15 (BALLOONS) ×1
BALLOON SAPPHIRE 2.5X15 (BALLOONS) IMPLANT
CATH 5FR JL3.5 JR4 ANG PIG MP (CATHETERS) IMPLANT
CATH LAUNCHER 6FR EBU3.5 (CATHETERS) IMPLANT
DEVICE RAD COMP TR BAND LRG (VASCULAR PRODUCTS) IMPLANT
ELECT DEFIB PAD ADLT CADENCE (PAD) IMPLANT
GLIDESHEATH SLEND SS 6F .021 (SHEATH) IMPLANT
GUIDEWIRE INQWIRE 1.5J.035X260 (WIRE) IMPLANT
INQWIRE 1.5J .035X260CM (WIRE) ×1
KIT ENCORE 26 ADVANTAGE (KITS) IMPLANT
KIT HEART LEFT (KITS) ×1 IMPLANT
PACK CARDIAC CATHETERIZATION (CUSTOM PROCEDURE TRAY) ×1 IMPLANT
SHEATH PROBE COVER 6X72 (BAG) IMPLANT
STENT SYNERGY XD 2.75X20 (Permanent Stent) IMPLANT
SYNERGY XD 2.75X20 (Permanent Stent) ×1 IMPLANT
TRANSDUCER W/STOPCOCK (MISCELLANEOUS) ×1 IMPLANT
TUBING CIL FLEX 10 FLL-RA (TUBING) ×1 IMPLANT
VALVE GUARDIAN II ~~LOC~~ HEMO (MISCELLANEOUS) IMPLANT
WIRE COUGAR XT STRL 190CM (WIRE) IMPLANT
WIRE HI TORQ VERSACORE-J 145CM (WIRE) IMPLANT
WIRE HI TORQ WHISPER MS 190CM (WIRE) IMPLANT

## 2022-04-07 NOTE — Progress Notes (Signed)
Echocardiogram 2D Echocardiogram has been performed.  Warren Lacy Briyana Badman RDCS 04/07/2022, 2:52 PM

## 2022-04-07 NOTE — Telephone Encounter (Signed)
Pharmacy Patient Advocate Encounter  Insurance verification completed.    The patient is insured through [employer] Part D   The patient is currently admitted and ran test claims for the following: Brilinta .  Copays and coinsurance results were relayed to Inpatient clinical team.  

## 2022-04-07 NOTE — Progress Notes (Signed)
Echo attempted at 1:15, now is not a good time for echo per RN, will come back later as schedule permits.  [facility] Jaquasia Doscher RDCS

## 2022-04-07 NOTE — TOC Benefit Eligibility Note (Signed)
Patient Advocate Encounter  Insurance verification completed.    The patient is currently admitted and upon discharge could be taking Brilinta 90 mg.  The current 30 day co-pay is $40.00.   The patient is insured through [employer] Medicare Part D     Yvonna Brun, CPhT Pharmacy Patient Advocate Specialist [facility] Patient Advocate Team Direct Number: [phone]  Fax: [phone]        

## 2022-04-07 NOTE — H&P (Signed)
Cardiology Admission History and Physical   Patient ID: Connie [name] MRN: [id number]; DOB: 01-18-1941   Admission date: 04/07/2022  PCP:  Connie Hale, MD   [facility] Providers Cardiologist:  Connie Ishikawa, MD        Chief Complaint: Chest pain  Patient Profile:   Connie [name] is a 81 y.o. female with paroxysmal atrial flutter who has declined anticoagulation who is being seen 04/07/2022 for the evaluation of chest pain with anterior STEMI.  History of Present Illness:   Connie [name] presents with substernal and epigastric sharp chest pain that started last night but recurred this morning and was more severe.  She has not had similar symptoms in the past.  She has no history of coronary artery disease.  She has been followed for paroxysmal atrial flutter with a low burden of atrial flutter and she has declined anticoagulation.  The patient called EMS today and on their arrival an EKG was consistent with an anterior STEMI.  A code STEMI is paged and she presents directly to the cardiac catheterization lab.  At the time of my evaluation after arrival here, she continues to have 9/10 chest pain.  There is associated nausea, vomiting, and diaphoresis.   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 Luiz [name]   TONSILLECTOMY     as a child   TOTAL KNEE ARTHROPLASTY Right 09/05/2014   Procedure:  TOTAL KNEE ARTHROPLASTY;  Surgeon: Connie Junior, MD;  Location: [facility] OR;  Service: Orthopedics;  Laterality: Right;   TOTAL KNEE ARTHROPLASTY Left 01/10/2022   Procedure: TOTAL KNEE ARTHROPLASTY;  Surgeon: Connie Geralds, MD;  Location: [facility] ORS;  Service: Orthopedics;  Laterality: Left;   TOTAL SHOULDER ARTHROPLASTY Left 11/17/2017   Procedure: LEFT TOTAL SHOULDER ARTHROPLASTY;  Surgeon: Connie Geralds, MD;  Location: [facility];  Service: Orthopedics;  Laterality: Left;  with block   TUBAL LIGATION  1972     Medications Prior to Admission: Prior to Admission medications   Medication Sig Start Date End Date Taking? Authorizing Provider  amLODipine (NORVASC) 5 MG tablet Take 5 mg by mouth daily.    [provider]  aspirin EC 81 MG tablet Take 1 tablet (81 mg total) by mouth daily. Swallow whole. 03/30/22   Connie Ishikawa, MD  atorvastatin (LIPITOR) 80 MG tablet Take 1 tablet (80 mg total) by mouth daily. 03/30/22 03/25/23  Connie Ishikawa, MD  carvedilol (COREG) 6.25 MG tablet Take 1 tablet (6.25 mg total) by mouth 2 (two) times daily. 07/27/21   Connie Ishikawa, MD  irbesartan (AVAPRO) 150 MG tablet Take 150 mg by mouth daily.    [provider]  levothyroxine (SYNTHROID) 75 MCG tablet Take 75 mcg by mouth daily  before breakfast.    [provider]  sertraline (ZOLOFT) 100 MG tablet Take 100 mg by mouth daily.    [provider]     Allergies:    Allergies  Allergen Reactions   Penicillins Rash and Other (See Comments)    Has patient had a PCN reaction causing immediate rash, facial/tongue/throat swelling, SOB or lightheadedness with hypotension: Yes Has patient had a PCN reaction causing severe rash involving mucus membranes or skin necrosis: No Has patient had a PCN reaction that required hospitalization: No Has patient had a PCN reaction occurring within the last 10 years: No If all of the above answers are "NO", then may proceed with  Cephalosporin use.    Sulfa Antibiotics Rash    Social History:   Social History   Socioeconomic History   Marital status: Married    Spouse name: Not on file   Number of children: Not on file   Years of education: Not on file   Highest education level: Not on file  Occupational History   Not on file  Tobacco Use   Smoking status: Never   Smokeless tobacco: Never  Vaping Use   Vaping Use: Never used  Substance and Sexual Activity   Alcohol use: Yes    Comment: rarely wine   Drug use: No   Sexual activity: Not on file  Other Topics Concern   Not on file  Social History Narrative   Not on file   Social Determinants of Health   Financial Resource Strain: Not on file  Food Insecurity: Not on file  Transportation Needs: Not on file  Physical Activity: Not on file  Stress: Not on file  Social Connections: Not on file  Intimate Partner Violence: Not on file    Family History:   The patient's family history is negative for Breast cancer.  Positive for heart attack in her father.  ROS:  Please see the history of present illness.  Balance problems.  All other ROS reviewed and negative.     Physical Exam/Data:  There were no vitals filed for this visit. No intake or output data in the 24 hours ending 04/07/22 1029    03/30/2022    2:55 PM 01/10/2022   10:00 AM 01/04/2022    9:41 AM  Last 3 Weights  Weight (lbs) 163 lb 6.4 oz 162 lb 162 lb  Weight (kg) 74.118 kg 73.483 kg 73.483 kg     There is no height or weight on file to calculate BMI.  General:  Well nourished, well developed, in no acute distress pleasant elderly woman HEENT: normal Neck: no JVD Vascular: No carotid bruits; Distal pulses 2+ bilaterally   Cardiac:  normal S1, S2; RRR; no murmur  Lungs:  clear to auscultation bilaterally, no wheezing, rhonchi or rales  Abd: soft, nontender, no hepatomegaly  Ext: no edema Musculoskeletal:  No deformities, BUE and BLE strength normal and equal Skin: warm and  dry  Neuro:  CNs 2-12 intact, no focal abnormalities noted Psych:  Normal affect    EKG:  The ECG that was done this morning was personally reviewed and demonstrates normal sinus rhythm with acute anterior STEMI pattern  Relevant CV Studies: Pending  Laboratory Data:  High Sensitivity Troponin:  No results for input(s): "TROPONINIHS" in the last 720 hours.    ChemistryNo results for input(s): "NA", "K", "CL", "CO2", "GLUCOSE", "BUN", "CREATININE", "CALCIUM", "MG", "GFRNONAA", "GFRAA", "ANIONGAP" in the last 168 hours.  No results for input(s): "PROT", "ALBUMIN", "  AST", "ALT", "ALKPHOS", "BILITOT" in the last 168 hours. Lipids No results for input(s): "CHOL", "TRIG", "HDL", "LABVLDL", "LDLCALC", "CHOLHDL" in the last 168 hours. HematologyNo results for input(s): "WBC", "RBC", "HGB", "HCT", "MCV", "MCH", "MCHC", "RDW", "PLT" in the last 168 hours. Thyroid No results for input(s): "TSH", "FREET4" in the last 168 hours. BNPNo results for input(s): "BNP", "PROBNP" in the last 168 hours.  DDimer No results for input(s): "DDIMER" in the last 168 hours.   Radiology/Studies:  No results found.   Assessment and Plan:   Acute anterior STEMI: Plan emergency cardiac catheterization and PCI.  Anticipate treating her with heparin and cangrelor in the setting of her nausea and vomiting.  We will transition her to DAPT with aspirin and ticagrelor.  Further plans/disposition pending her cardiac catheterization result. Paroxysmal atrial flutter: We will revisit anticoagulation strategies.  In light of her STEMI and requirement for DAPT, consider continued observation versus left atrial appendage occlusion. Hypertension: Treated with multidrug therapy.  Will adjust based on the results of her cardiac catheterization.  Further plans/disposition pending cardiac catheterization result.   Risk Assessment/Risk Scores:    TIMI Risk Score for ST  Elevation MI:   The patient's TIMI risk score is 6, which  indicates a 16.1% risk of all cause mortality at 30 days.     CHA2DS2-VASc Score = 5   This indicates a 7.2% annual risk of stroke. The patient's score is based upon: CHF History: 0 HTN History: 1 Diabetes History: 0 Stroke History: 0 Vascular Disease History: 1 Age Score: 2 Gender Score: 1      Severity of Illness: The appropriate patient status for this patient is INPATIENT. Inpatient status is judged to be reasonable and necessary in order to provide the required intensity of service to ensure the patient's safety. The patient's presenting symptoms, physical exam findings, and initial radiographic and laboratory data in the context of their chronic comorbidities is felt to place them at high risk for further clinical deterioration. Furthermore, it is not anticipated that the patient will be medically stable for discharge from the hospital within 2 midnights of admission.   * I certify that at the point of admission it is my clinical judgment that the patient will require inpatient hospital care spanning beyond 2 midnights from the point of admission due to high intensity of service, high risk for further deterioration and high frequency of surveillance required.*   For questions or updates, please contact [facility] Please consult [email or web address] for contact info under     Signed, Tonny Bollman, MD  04/07/2022 10:29 AM

## 2022-04-08 DIAGNOSIS — I5021 Acute systolic (congestive) heart failure: Secondary | ICD-10-CM

## 2022-04-08 DIAGNOSIS — I2102 ST elevation (STEMI) myocardial infarction involving left anterior descending coronary artery: Secondary | ICD-10-CM | POA: Diagnosis not present

## 2022-04-08 DIAGNOSIS — I255 Ischemic cardiomyopathy: Secondary | ICD-10-CM

## 2022-04-08 LAB — TSH: TSH: 0.773 u[IU]/mL (ref 0.350–4.500)

## 2022-04-08 LAB — GLUCOSE, CAPILLARY
Glucose-Capillary: 137 mg/dL — ABNORMAL HIGH (ref 70–99)
Glucose-Capillary: 165 mg/dL — ABNORMAL HIGH (ref 70–99)

## 2022-04-08 LAB — BASIC METABOLIC PANEL
Anion gap: 11 (ref 5–15)
BUN: 18 mg/dL (ref 8–23)
CO2: 20 mmol/L — ABNORMAL LOW (ref 22–32)
Calcium: 9.2 mg/dL (ref 8.9–10.3)
Chloride: 102 mmol/L (ref 98–111)
Creatinine, Ser: 0.85 mg/dL (ref 0.44–1.00)
GFR, Estimated: >60 mL/min (ref >60)
Glucose, Bld: 131 mg/dL — ABNORMAL HIGH (ref 70–99)
Potassium: 5.1 mmol/L (ref 3.5–5.1)
Sodium: 133 mmol/L — ABNORMAL LOW (ref 135–145)

## 2022-04-08 LAB — CBC
HCT: 35.1 % — ABNORMAL LOW (ref 36.0–46.0)
Hemoglobin: 11.4 g/dL — ABNORMAL LOW (ref 12.0–15.0)
MCH: 28.4 pg (ref 26.0–34.0)
MCHC: 32.5 g/dL (ref 30.0–36.0)
MCV: 87.3 fL (ref 80.0–100.0)
Platelets: 289 10*3/uL (ref 150–400)
RBC: 4.02 MIL/uL (ref 3.87–5.11)
RDW: 13.6 % (ref 11.5–15.5)
WBC: 22.7 10*3/uL — ABNORMAL HIGH (ref 4.0–10.5)
nRBC: 0 % (ref 0.0–0.2)

## 2022-04-08 LAB — APTT: aPTT: >200 seconds (ref 24–36)

## 2022-04-08 MED ORDER — AMIODARONE IV BOLUS ONLY 150 MG/100ML
150.0000 mg | Freq: Once | INTRAVENOUS | Status: AC
  Administered 2022-04-08: 150 mg via INTRAVENOUS
  Filled 2022-04-08: qty 100

## 2022-04-08 MED ORDER — METOPROLOL TARTRATE 12.5 MG HALF TABLET
12.5000 mg | ORAL_TABLET | Freq: Two times a day (BID) | ORAL | Status: DC
  Administered 2022-04-08: 12.5 mg via ORAL
  Filled 2022-04-08 (×2): qty 1

## 2022-04-08 MED ORDER — AMIODARONE HCL IN DEXTROSE 360-4.14 MG/200ML-% IV SOLN
30.0000 mg/h | INTRAVENOUS | Status: DC
  Administered 2022-04-08 – 2022-04-11 (×5): 30 mg/h via INTRAVENOUS
  Filled 2022-04-08 (×5): qty 200

## 2022-04-08 MED ORDER — FUROSEMIDE 10 MG/ML IJ SOLN
40.0000 mg | Freq: Once | INTRAMUSCULAR | Status: AC
  Administered 2022-04-08: 40 mg via INTRAVENOUS
  Filled 2022-04-08: qty 4

## 2022-04-08 NOTE — Care Management (Signed)
  Transition of Care [facility]) Screening Note   Patient Details  Name: Connie [name] Date of Birth: 07/17/1941   Transition of Care [facility]) CM/SW Contact:    Gala Lewandowsky, RN Phone Number: 04/08/2022, 1:54 PM    Transition of Care Department [facility]) has reviewed patient and no TOC needs have been identified at this time. Per MD, plan will be for staged PCI of the RCA 04-11-22. We will continue to monitor patient advancement through interdisciplinary progression rounds. If new patient transition needs arise, please place a TOC consult.

## 2022-04-08 NOTE — Progress Notes (Signed)
Rounding Note    Patient Name: Connie [name] Date of Encounter: 04/08/2022  [facility] Cardiologist: Little Ishikawa, MD   Subjective   Still with pleuritic chest pain. No dyspnea at rest but O2 sats into the low 90s overnight.   Inpatient Medications    Scheduled Meds:  aspirin EC  81 mg Oral Daily   atorvastatin  80 mg Oral Daily   Chlorhexidine Gluconate Cloth  6 each Topical Daily   heparin  5,000 Units Subcutaneous Q8H   levothyroxine  75 mcg Oral QAC breakfast   sertraline  100 mg Oral Daily   sodium chloride flush  3 mL Intravenous Q12H   ticagrelor  90 mg Oral BID   Continuous Infusions:  sodium chloride 10 mL/hr at 04/08/22 0700   PRN Meds: sodium chloride, acetaminophen, morphine injection, nitroGLYCERIN, ondansetron (ZOFRAN) IV, mouth rinse, oxyCODONE, sodium chloride flush   Vital Signs    Vitals:   04/08/22 0400 04/08/22 0500 04/08/22 0600 04/08/22 0700  BP: 110/64 (!) 117/52 (!) 113/50 (!) 122/99  Pulse: 61 64 66 78  Resp: 13 13 14 18   Temp:      TempSrc:      SpO2: 93% 92% 92% 92%    Intake/Output Summary (Last 24 hours) at 04/08/2022 0807 Last data filed at 04/08/2022 0700 Gross per 24 hour  Intake 495.02 ml  Output 901 ml  Net -405.98 ml      03/30/2022    2:55 PM 01/10/2022   10:00 AM 01/04/2022    9:41 AM  Last 3 Weights  Weight (lbs) 163 lb 6.4 oz 162 lb 162 lb  Weight (kg) 74.118 kg 73.483 kg 73.483 kg      Telemetry    Sinus, PVCs, short runs of NSVT - Personally Reviewed  ECG    Sinus PVCs, evolving anterior MI, incomplete RBBB - Personally Reviewed  Physical Exam   GEN: No acute distress.   Neck: + JVD Cardiac: RRR, no murmurs, rubs, or gallops.  Respiratory: Clear to auscultation bilaterally. GI: Soft, nontender, non-distended  MS: No edema; No deformity. Neuro:  Nonfocal  Psych: Normal affect   Labs    High Sensitivity Troponin:   Recent Labs  Lab 04/07/22 1044 04/07/22 1306   TROPONINIHS 895* >24,000*     Chemistry Recent Labs  Lab 04/07/22 1033 04/07/22 1044  NA 137 134*  K 4.4 3.6  CL 103 105  CO2  --  22  GLUCOSE 124* 143*  BUN 16 15  CREATININE 0.70 0.73  CALCIUM  --  8.8*  PROT  --  5.8*  ALBUMIN  --  3.5  AST  --  25  ALT  --  16  ALKPHOS  --  99  BILITOT  --  0.8  GFRNONAA  --  >60  ANIONGAP  --  7    Lipids  Recent Labs  Lab 04/07/22 1044  CHOL 125  TRIG 103  HDL 50  LDLCALC 54  CHOLHDL 2.5    Hematology Recent Labs  Lab 04/07/22 1044 04/07/22 1341 04/08/22 0646  WBC 10.5 15.9* 22.7*  RBC 4.06 4.44 4.02  HGB 11.4* 12.8 11.4*  HCT 34.9* 38.3 35.1*  MCV 86.0 86.3 87.3  MCH 28.1 28.8 28.4  MCHC 32.7 33.4 32.5  RDW 13.4 13.3 13.6  PLT 308 283 289   Thyroid No results for input(s): "TSH", "FREET4" in the last 168 hours.  BNP Recent Labs  Lab 04/07/22 1044  BNP 126.5*  DDimer No results for input(s): "DDIMER" in the last 168 hours.   Radiology    ECHOCARDIOGRAM COMPLETE  Result Date: 04/07/2022    ECHOCARDIOGRAM REPORT   Patient Name:   Connie [name] Date of Exam: 04/07/2022 Medical Rec #:  [id number]          Height:       60.0 in Accession #:    [id number]         Weight:       163.4 lb Date of Birth:  August 02, 1940          BSA:          1.713 m Patient Age:    81 years           BP:           131/60 mmHg Patient Gender: F                  HR:           63 bpm. Exam Location:  Inpatient Procedure: 2D Echo, Color Doppler, Cardiac Doppler and Intracardiac            Opacification Agent Indications:    R07.9* Chest pain, unspecified  History:        Patient has prior history of Echocardiogram examinations, most                 recent 04/07/2021. Risk Factors:Hypertension.  Sonographer:    Irving Burton Senior RDCS Referring Phys: [phone] MICHAEL COOPER IMPRESSIONS  1. No apical thrombus with Definity contrast. Left ventricular ejection fraction, by estimation, is 35 to 40%. The left ventricle has moderately decreased function. The  left ventricle demonstrates regional wall motion abnormalities (see scoring diagram/findings for description). Left ventricular diastolic parameters are consistent with Grade I diastolic dysfunction (impaired relaxation). There is severe akinesis of the left ventricular, entire anterior wall, anteroseptal wall, apical segment and inferoapical segment. 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 The basal inferior and lateral walls are hyperdynamic.  2. Right ventricular systolic function is mildly reduced. The right ventricular size is normal. Tricuspid regurgitation signal is inadequate for assessing PA pressure.  3. The mitral valve is abnormal. Trivial mitral valve regurgitation.  4. The aortic valve is tricuspid. Aortic valve regurgitation is not visualized.  5. The inferior vena cava is normal in size with greater than 50% respiratory variability, suggesting right atrial pressure of 3 mmHg. Comparison(s): Changes from prior study are noted. 04/07/2021: LVEF 55-60%, normal wall motion. FINDINGS  Left Ventricle: No apical thrombus with Definity contrast. Left ventricular ejection fraction, by estimation, is 35 to 40%. The left ventricle has moderately decreased function. The left ventricle demonstrates regional wall motion abnormalities. Severe akinesis of the left ventricular, entire anterior wall, anteroseptal wall, apical segment and inferoapical segment. Definity contrast agent was given IV to delineate the left ventricular endocardial borders. The left ventricular internal cavity size was normal in size. There is no left ventricular hypertrophy. Left ventricular diastolic parameters are consistent with Grade I diastolic dysfunction (impaired relaxation). Indeterminate filling pressures. Right Ventricle: The right ventricular size is normal. No increase in right ventricular wall thickness. Right ventricular systolic function is mildly reduced. Tricuspid regurgitation signal is inadequate for  assessing PA pressure. Left Atrium: Left atrial size was normal in size. Right Atrium: Right atrial size was normal in size. Pericardium: There is no evidence of pericardial effusion. Mitral Valve: The mitral valve is abnormal. There is mild calcification of the posterior mitral valve  leaflet(s). Mild mitral annular calcification. Trivial mitral valve regurgitation. Tricuspid Valve: The tricuspid valve is grossly normal. Tricuspid valve regurgitation is trivial. Aortic Valve: The aortic valve is tricuspid. Aortic valve regurgitation is not visualized. Pulmonic Valve: The pulmonic valve was normal in structure. Pulmonic valve regurgitation is not visualized. Aorta: The aortic root and ascending aorta are structurally normal, with no evidence of dilitation. Venous: The inferior vena cava is normal in size with greater than 50% respiratory variability, suggesting right atrial pressure of 3 mmHg. IAS/Shunts: There is right bowing of the interatrial septum, suggestive of elevated left atrial pressure. No atrial level shunt detected by color flow Doppler.  LEFT VENTRICLE PLAX 2D LVIDd:         3.20 cm   Diastology LVIDs:         2.40 cm   LV e' medial:    4.57 cm/s LV PW:         1.20 cm   LV E/e' medial:  9.2 LV IVS:        1.00 cm   LV e' lateral:   5.66 cm/s LVOT diam:     1.80 cm   LV E/e' lateral: 7.5 LV SV:         41 LV SV Index:   24 LVOT Area:     2.54 cm  RIGHT VENTRICLE RV S prime:     8.59 cm/s TAPSE (M-mode): 1.6 cm LEFT ATRIUM             Index        RIGHT ATRIUM           Index LA diam:        3.30 cm 1.93 cm/m   RA Area:     10.80 cm LA Vol (A2C):   38.7 ml 22.59 ml/m  RA Volume:   23.30 ml  13.60 ml/m LA Vol (A4C):   33.0 ml 19.26 ml/m LA Biplane Vol: 35.8 ml 20.90 ml/m  AORTIC VALVE LVOT Vmax:   69.80 cm/s LVOT Vmean:  47.800 cm/s LVOT VTI:    0.163 m  AORTA Ao Root diam: 2.90 cm Ao Asc diam:  2.90 cm MITRAL VALVE MV Area (PHT): 1.76 cm     SHUNTS MV Decel Time: 432 msec     Systemic VTI:  0.16  m MV E velocity: 42.20 cm/s   Systemic Diam: 1.80 cm MV A velocity: 112.00 cm/s MV E/A ratio:  0.38 Zoila Shutter MD Electronically signed by Zoila Shutter MD Signature Date/Time: 04/07/2022/4:26:05 PM    Final    CARDIAC CATHETERIZATION  Result Date: 04/07/2022   Prox LAD to Mid LAD lesion is 100% stenosed.   Prox RCA lesion is 60% stenosed.   Mid RCA lesion is 90% stenosed.   A drug-eluting stent was successfully placed using a SYNERGY XD 2.75X20.   Post intervention, there is a 0% residual stenosis.   LV end diastolic pressure is severely elevated. 1.  Acute anterior STEMI secondary to thrombotic occlusion of the mid LAD just after the first diagonal, treated with PCI using a 2.75 x 20 mm Synergy DES.  Procedure complicated by no reflow phenomenon likely due to late STEMI presentation with TIMI II flow at the apical portion of the LAD at the completion of the procedure 2.  Severe mid RCA stenosis, recommend staged PCI once she recovers from her anterior infarct 3.  Patent left main and left circumflex with mild nonobstructive plaquing 4.  Severely elevated LVEDP of 28 mmHg  consistent with acute systolic heart failure secondary to acute myocardial infarction Recommendations: ICU care, post MI medical therapy, patient loaded with ticagrelor 180 mg at the completion of the procedure, continue cangrelor x2 hours, consider staged PCI of the RCA prior to discharge pending her clinical course.  Will check 2D echo to assess degree of LV dysfunction.    Cardiac Studies   See above  Patient Profile     81 y.o. female with history of paroxysmal atrial flutter not on anti-coagulation, GERD, HTN and hypothyroidism admitted 04/07/22 with an anterior STEMI. Cardiac cath 04/07/22 with thrombotic occlusion of the mid LAD. Also with severe mid RCA stenosis. LVEDP 28 mmHg.   Assessment & Plan    CAD/anterior STEMI: Doing well this am. No chest pain. BP stable. Continue DAPT with ASA and Brilinta. Continue statin. Will  start a low dose beta blocker today. Will add ARB as BP tolerates over the weekend. Planning for staged PCI of the RCA next week.   Ischemic cardiomyopathy/Acute systolic CHF:  LVEF=35-40% by echo with severe hypokinesis of the entire anterior wall and apex. Will add a low dose beta blocker today. Will diurese with IV Lasix with mild volume overload and elevated LV filling pressures on cath.   Transfer to telemetry. Plan for staged PCI of the RCA on Monday if stable. I will add her to the schedule and put in cath orders.   For questions or updates, please contact [facility] Please consult [email or web address] for contact info under       Signed, Verne Carrow, MD  04/08/2022, 8:07 AM

## 2022-04-08 NOTE — Progress Notes (Signed)
81 yo female admitted with anterior STEMI with PCI to LAD, plans for staged PCI to RCA next week. History of paroxysmal aflutter who has declined anticoag, went into afib today with RVR. Received oral lopressor around 3pm. This evening multiple episodes of afib with termination pauses last 6-7 seconds with loss of consciousness. Discussed with EP, transfer to ICU. Pads placed, trial of amiodarone at low dose to see if can maintain SR and prevent pauses, if persistent may need temp wire tonight.    Dina Rich MD

## 2022-04-08 NOTE — Progress Notes (Addendum)
   I was notified by RN that patient had two pauses on telemetry. During the second pause, RN was in the room and witnessed the patient loose consciousness briefly. Reportedly, patient's eyes rolled back and she went limp for a few seconds.   On my review of telemetry, patient is now in atrial fibrillation with HR 120s-130s. She did have 2 separate sinus pauses, each lasting about 7 seconds.   I evaluated patient. She was resting comfortably in the bed in no acute distress. She reported that she had been laying in bed resting when she lost consciousness. Denied any chest pain, sob, dizziness. She is having some palpitations, reportedly similar to symptoms she has had in the past with her afib history.   Discussed case with Dr. Wyline Mood. Follow his recommendations.   Transferring patient to ICU.    Jonita Albee, PA-C 04/08/2022 6:46 PM

## 2022-04-08 NOTE — Significant Event (Signed)
Rapid Response Event Note   Reason for Call :  Two asystolic pauses on telemetry. 6-7 seconds.   Initial Focused Assessment:  Pt lying in bed, AO. No distress, no complaints. Heart rate is irregular, atrial fibrillation, HR 120-130s. Pt denies chest pain, lightheadedness, dizziness.   Pt endorses she was resting in bed prior to both events and she felt them "come on suddenly". She denies bearing down or being in pain at the onset of symptoms.   VS: T 98.39F, BP 86/66, HR 117, RR 20, SpO2 97% on room air CBG: 137  Interventions:  CBG check  Plan of Care:  RN to follow-up with Cardiology Update: pt to transfer to ICU  Event Summary:  MD Notified: per RN Call Time: 1804 Arrival Time: 1815 End Time: 1825  Jennye Moccasin, RN

## 2022-04-08 NOTE — Progress Notes (Signed)
EKG CRITICAL VALUE     12 lead EKG performed.  Critical value noted.  Christa, RN notified.   Edmonia Caprio, CCT 04/08/2022 7:26 AM

## 2022-04-09 DIAGNOSIS — I2102 ST elevation (STEMI) myocardial infarction involving left anterior descending coronary artery: Secondary | ICD-10-CM | POA: Diagnosis not present

## 2022-04-09 LAB — CBC
HCT: 32.2 % — ABNORMAL LOW (ref 36.0–46.0)
Hemoglobin: 10.4 g/dL — ABNORMAL LOW (ref 12.0–15.0)
MCH: 28.1 pg (ref 26.0–34.0)
MCHC: 32.3 g/dL (ref 30.0–36.0)
MCV: 87 fL (ref 80.0–100.0)
Platelets: 253 10*3/uL (ref 150–400)
RBC: 3.7 MIL/uL — ABNORMAL LOW (ref 3.87–5.11)
RDW: 13.8 % (ref 11.5–15.5)
WBC: 22.7 10*3/uL — ABNORMAL HIGH (ref 4.0–10.5)
nRBC: 0 % (ref 0.0–0.2)

## 2022-04-09 LAB — BASIC METABOLIC PANEL
Anion gap: 11 (ref 5–15)
BUN: 23 mg/dL (ref 8–23)
CO2: 22 mmol/L (ref 22–32)
Calcium: 8.9 mg/dL (ref 8.9–10.3)
Chloride: 97 mmol/L — ABNORMAL LOW (ref 98–111)
Creatinine, Ser: 1.18 mg/dL — ABNORMAL HIGH (ref 0.44–1.00)
GFR, Estimated: 46 mL/min — ABNORMAL LOW (ref >60)
Glucose, Bld: 153 mg/dL — ABNORMAL HIGH (ref 70–99)
Potassium: 4.6 mmol/L (ref 3.5–5.1)
Sodium: 130 mmol/L — ABNORMAL LOW (ref 135–145)

## 2022-04-09 LAB — LIPOPROTEIN A (LPA): Lipoprotein (a): 36.9 nmol/L — ABNORMAL HIGH (ref <75.0)

## 2022-04-09 LAB — MAGNESIUM: Magnesium: 1.7 mg/dL (ref 1.7–2.4)

## 2022-04-09 LAB — HEPARIN LEVEL (UNFRACTIONATED): Heparin Unfractionated: 0.11 IU/mL — ABNORMAL LOW (ref 0.30–0.70)

## 2022-04-09 LAB — MRSA NEXT GEN BY PCR, NASAL: MRSA by PCR Next Gen: NOT DETECTED

## 2022-04-09 MED ORDER — CLOPIDOGREL BISULFATE 300 MG PO TABS
600.0000 mg | ORAL_TABLET | Freq: Once | ORAL | Status: AC
  Administered 2022-04-09: 600 mg via ORAL
  Filled 2022-04-09: qty 2

## 2022-04-09 MED ORDER — CLOPIDOGREL BISULFATE 75 MG PO TABS
75.0000 mg | ORAL_TABLET | Freq: Every day | ORAL | Status: DC
  Administered 2022-04-10 – 2022-04-14 (×5): 75 mg via ORAL
  Filled 2022-04-09 (×5): qty 1

## 2022-04-09 MED ORDER — HEPARIN (PORCINE) 25000 UT/250ML-% IV SOLN
900.0000 [IU]/h | INTRAVENOUS | Status: DC
  Administered 2022-04-09: 700 [IU]/h via INTRAVENOUS
  Administered 2022-04-10: 900 [IU]/h via INTRAVENOUS
  Filled 2022-04-09 (×2): qty 250

## 2022-04-09 MED ORDER — FUROSEMIDE 10 MG/ML IJ SOLN
40.0000 mg | Freq: Once | INTRAMUSCULAR | Status: AC
  Administered 2022-04-09: 40 mg via INTRAVENOUS
  Filled 2022-04-09: qty 4

## 2022-04-09 NOTE — Progress Notes (Signed)
ANTICOAGULATION CONSULT NOTE - Initial Consult  Pharmacy Consult for heparin Indication: atrial fibrillation  Allergies  Allergen Reactions   Penicillins Rash and Other (See Comments)    Has patient had a PCN reaction causing immediate rash, facial/tongue/throat swelling, SOB or lightheadedness with hypotension: Yes Has patient had a PCN reaction causing severe rash involving mucus membranes or skin necrosis: No Has patient had a PCN reaction that required hospitalization: No Has patient had a PCN reaction occurring within the last 10 years: No If all of the above answers are "NO", then may proceed with Cephalosporin use.    Sulfa Antibiotics Rash    Patient Measurements:     Vital Signs: Temp: 97.7 F (36.5 C) (09/16 0700) Temp Source: Oral (09/16 0700) BP: 94/53 (09/16 1100) Pulse Rate: 109 (09/16 1100)  Labs: Recent Labs    04/07/22 1044 04/07/22 1306 04/07/22 1341 04/08/22 0646 04/09/22 0103  HGB 11.4*  --  12.8 11.4* 10.4*  HCT 34.9*  --  38.3 35.1* 32.2*  PLT 308  --  283 289 253  APTT >200*  --   --   --   --   LABPROT 18.0*  --   --   --   --   INR 1.5*  --   --   --   --   CREATININE 0.73  --   --  0.85 1.18*  TROPONINIHS 895* >24,000*  --   --   --     Estimated Creatinine Clearance: 32.4 mL/min (A) (by C-G formula based on SCr of 1.18 mg/dL (H)).   Medical History: 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   Multiple, noted on [place]   History of hiatal hernia    Hypertension    Hypothyroidism    Macular degeneration    Psoriatic arthritis ([place])    Sinus drainage    UTI (urinary tract infection)     Assessment: 81yof with CAD s/p DES to LAD and planned DES to RCA next week.  PAF in AFib RVR last night > SR this am on amiodarone Start heparin drip for now and plan DOAC after procedures complete. CBC stable  Goal of Therapy:  Heparin  level 0.3-0.7 units/ml Monitor platelets by anticoagulation protocol: Yes   Plan:  Stop sq heparin No bolus Heparin drip 700 uts/hr  Heparin level in 6hr from start  Daily heparin level and CBC    Bonnita Nasuti Pharm.D. CPP, BCPS Clinical Pharmacist [phone] 04/09/2022 11:23 AM    Nadine Counts 04/09/2022,11:21 AM

## 2022-04-09 NOTE — Consult Note (Signed)
Cardiology Consultation   Patient ID: Connie [name] MRN: [id number]; DOB: 01-28-1941  Admit date: 04/07/2022 Date of Consult: 04/09/2022  PCP:  [facility] Sm[facility], [facility] Cardiologist:  Donato Heinz, MD        Patient Profile:   Connie [name] is a 81 y.o. female with a hx of CAD, atrial flutter who is being seen 04/09/2022 for the evaluation of atrial fibrillation at the request of [name].  History of Present Illness:   [name] she has a past history significant for atrial flutter.  She presented to emergency room with sharp epigastric chest pain.  She was found to have anterior STEMI.  She is now status post drug-eluting stent to the LAD.  She was found to have new onset systolic heart failure with an ejection fraction of 30 to 35%.  During left heart catheterization, she was found to have severe mid RCA stenosis with plans for repeat catheterization on 04/11/2022 for RCA intervention.  Yesterday, she developed atrial fibrillation.  Heart rates were rapid.  She would intermittently convert to sinus rhythm with pauses of 6 to 7 seconds associated with syncope.  She previously has a history of atrial flutter, but has refused anticoagulation.  She was started on amiodarone.  Since starting amiodarone, she has continued to have atrial fibrillation, but pauses have improved.  She feels well currently.  She has not had any further episodes of syncope.   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   Multiple, noted on [place]   History of hiatal hernia    Hypertension    Hypothyroidism    Macular degeneration    Psoriatic arthritis ([place])    Sinus drainage    UTI (urinary tract infection)     Past Surgical History:  Procedure Laterality Date   BACK SURGERY  2000   Dr Sherwood Gambler   bladder tack  2005   COLONOSCOPY      DILATION AND CURETTAGE OF UTERUS     after miscarriage   LEFT HEART CATH AND CORONARY ANGIOGRAPHY N/A 04/07/2022   Procedure: LEFT HEART CATH AND CORONARY ANGIOGRAPHY;  Surgeon: Sherren Mocha, MD;  Location: [facility];  Service: Cardiovascular;  Laterality: N/A;   RECTAL PROLAPSE REPAIR  2008   SCAR REVISION Right 2017   Knee, Dr. Berenice Primas   TONSILLECTOMY     as a child   TOTAL KNEE ARTHROPLASTY Right 09/05/2014   Procedure: TOTAL KNEE ARTHROPLASTY;  Surgeon: Alta Corning, MD;  Location: [facility];  Service: Orthopedics;  Laterality: Right;   TOTAL KNEE ARTHROPLASTY Left 01/10/2022   Procedure: TOTAL KNEE ARTHROPLASTY;  Surgeon: Dorna Leitz, MD;  Location: [facility];  Service: Orthopedics;  Laterality: Left;   TOTAL SHOULDER ARTHROPLASTY Left 11/17/2017   Procedure: LEFT TOTAL SHOULDER ARTHROPLASTY;  Surgeon: Dorna Leitz, MD;  Location: [facility];  Service: Orthopedics;  Laterality: Left;  with block   TUBAL LIGATION  1972     Home Medications:  Prior to Admission medications   Medication Sig Start Date End Date Taking? Authorizing Provider  amLODipine (NORVASC) 5 MG tablet Take 5 mg by mouth daily.   Yes [provider]  aspirin EC 81 MG tablet Take 1 tablet (81 mg total) by mouth daily. Swallow whole. 03/30/22  Yes Donato Heinz, MD  carvedilol (COREG)  6.25 MG tablet Take 1 tablet (6.25 mg total) by mouth 2 (two) times daily. 07/27/21  Yes Donato Heinz, MD  irbesartan (AVAPRO) 150 MG tablet Take 150 mg by mouth daily.   Yes [provider]  levothyroxine (SYNTHROID) 88 MCG tablet Take 88 mcg by mouth daily before breakfast.   Yes [provider]  Multiple Vitamins-Minerals (PRESERVISION AREDS 2+MULTI VIT PO) Take 1 tablet by mouth in the morning and at bedtime.   Yes [provider]  naproxen sodium (ALEVE) 220 MG tablet Take 220 mg by mouth daily as needed (For pain).   Yes [provider]  rosuvastatin (CRESTOR) 10 MG tablet  Take 10 mg by mouth daily.   Yes [provider]  sertraline (ZOLOFT) 100 MG tablet Take 100 mg by mouth daily.   Yes [provider]  atorvastatin (LIPITOR) 80 MG tablet Take 1 tablet (80 mg total) by mouth daily. Patient not taking: Reported on 04/07/2022 03/30/22 03/25/23  Donato Heinz, MD    Inpatient Medications: Scheduled Meds:  aspirin EC  81 mg Oral Daily   atorvastatin  80 mg Oral Daily   Chlorhexidine Gluconate Cloth  6 each Topical Daily   heparin  5,000 Units Subcutaneous Q8H   levothyroxine  75 mcg Oral QAC breakfast   sertraline  100 mg Oral Daily   sodium chloride flush  3 mL Intravenous Q12H   ticagrelor  90 mg Oral BID   Continuous Infusions:  sodium chloride Stopped (04/08/22 0825)   amiodarone 30 mg/hr (04/09/22 1000)   PRN Meds: sodium chloride, acetaminophen, morphine injection, nitroGLYCERIN, ondansetron (ZOFRAN) IV, mouth rinse, oxyCODONE, sodium chloride flush  Allergies:    Allergies  Allergen Reactions   Penicillins Rash and Other (See Comments)    Has patient had a PCN reaction causing immediate rash, facial/tongue/throat swelling, SOB or lightheadedness with hypotension: Yes Has patient had a PCN reaction causing severe rash involving mucus membranes or skin necrosis: No Has patient had a PCN reaction that required hospitalization: No Has patient had a PCN reaction occurring within the last 10 years: No If all of the above answers are "NO", then may proceed with Cephalosporin use.    Sulfa Antibiotics Rash    Social History:   Social History   Socioeconomic History   Marital status: Married    Spouse name: Not on file   Number of children: Not on file   Years of education: Not on file   Highest education level: Not on file  Occupational History   Not on file  Tobacco Use   Smoking status: Never   Smokeless tobacco: Never  Vaping Use   Vaping Use: Never used  Substance and Sexual Activity   Alcohol use: Yes     Comment: rarely wine   Drug use: No   Sexual activity: Not on file  Other Topics Concern   Not on file  Social History Narrative   Not on file   Social Determinants of Health   Financial Resource Strain: Not on file  Food Insecurity: Not on file  Transportation Needs: Not on file  Physical Activity: Not on file  Stress: Not on file  Social Connections: Not on file  Intimate Partner Violence: Not on file    Family History:    Family History  Problem Relation Age of Onset   Breast cancer Neg Hx      ROS:  Please see the history of present illness.   All other ROS reviewed  and negative.     Physical Exam/Data:   Vitals:   04/09/22 0700 04/09/22 0800 04/09/22 0900 04/09/22 1000  BP: (!) 112/51 98/62 (!) 101/58 96/62  Pulse: 81 (!) 106 99 (!) 111  Resp: 20 17 16 16   Temp: 97.7 F (36.5 C)     TempSrc: Oral     SpO2: 95% 94% 93% 92%    Intake/Output Summary (Last 24 hours) at 04/09/2022 1048 Last data filed at 04/09/2022 1000 Gross per 24 hour  Intake 778.37 ml  Output 150 ml  Net 628.37 ml      03/30/2022    2:55 PM 01/10/2022   10:00 AM 01/04/2022    9:41 AM  Last 3 Weights  Weight (lbs) 163 lb 6.4 oz 162 lb 162 lb  Weight (kg) 74.118 kg 73.483 kg 73.483 kg     There is no height or weight on file to calculate BMI.  General:  Well nourished, well developed, in no acute distress HEENT: normal Neck: no JVD Vascular: No carotid bruits; Distal pulses 2+ bilaterally Cardiac: Irregular; no murmur  Lungs:  clear to auscultation bilaterally, no wheezing, rhonchi or rales  Abd: soft, nontender, no hepatomegaly  Ext: no edema Musculoskeletal:  No deformities, BUE and BLE strength normal and equal Skin: warm and dry  Neuro:  CNs 2-12 intact, no focal abnormalities noted Psych:  Normal affect   EKG:  The EKG was personally reviewed and demonstrates: Atrial fibrillation, right bundle branch block Telemetry:  Telemetry was personally reviewed and demonstrates: Atrial  fibrillation  Relevant CV Studies: TTE 04/07/22  1. No apical thrombus with Definity contrast. Left ventricular ejection  fraction, by estimation, is 35 to 40%. The left ventricle has moderately  decreased function. The left ventricle demonstrates regional wall motion  abnormalities (see scoring  diagram/findings for description). Left ventricular diastolic parameters  are consistent with Grade I diastolic dysfunction (impaired relaxation).  There is severe akinesis of the left ventricular, entire anterior wall,  anteroseptal wall, apical segment  and inferoapical segment. 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 The basal inferior and lateral walls are hyperdynamic.   2. Right ventricular systolic function is mildly reduced. The right  ventricular size is normal. Tricuspid regurgitation signal is inadequate  for assessing PA pressure.   3. The mitral valve is abnormal. Trivial mitral valve regurgitation.   4. The aortic valve is tricuspid. Aortic valve regurgitation is not  visualized.   5. The inferior vena cava is normal in size with greater than 50%  respiratory variability, suggesting right atrial pressure of 3 mmHg.   LHC 04/07/22   Prox LAD to Mid LAD lesion is 100% stenosed.   Prox RCA lesion is 60% stenosed.   Mid RCA lesion is 90% stenosed.   A drug-eluting stent was successfully placed using a SYNERGY XD 2.75X20.   Post intervention, there is a 0% residual stenosis.   LV end diastolic pressure is severely elevated.  Laboratory Data:  High Sensitivity Troponin:   Recent Labs  Lab 04/07/22 1044 04/07/22 1306  TROPONINIHS 895* >24,000*     Chemistry Recent Labs  Lab 04/07/22 1044 04/08/22 0646 04/09/22 0103  NA 134* 133* 130*  K 3.6 5.1 4.6  CL 105 102 97*  CO2 22 20* 22  GLUCOSE 143* 131* 153*  BUN 15 18 23   CREATININE 0.73 0.85 1.18*  CALCIUM 8.8* 9.2 8.9  MG  --   --  1.7  GFRNONAA >60 >60 46*  ANIONGAP 7 11  11    Recent Labs   Lab 04/07/22 1044  PROT 5.8*  ALBUMIN 3.5  AST 25  ALT 16  ALKPHOS 99  BILITOT 0.8   Lipids  Recent Labs  Lab 04/07/22 1044  CHOL 125  TRIG 103  HDL 50  LDLCALC 54  CHOLHDL 2.5    Hematology Recent Labs  Lab 04/07/22 1341 04/08/22 0646 04/09/22 0103  WBC 15.9* 22.7* 22.7*  RBC 4.44 4.02 3.70*  HGB 12.8 11.4* 10.4*  HCT 38.3 35.1* 32.2*  MCV 86.3 87.3 87.0  MCH 28.8 28.4 28.1  MCHC 33.4 32.5 32.3  RDW 13.3 13.6 13.8  PLT 283 289 253   Thyroid  Recent Labs  Lab 04/08/22 0646  TSH 0.773    BNP Recent Labs  Lab 04/07/22 1044  BNP 126.5*    DDimer No results for input(s): "DDIMER" in the last 168 hours.   Radiology/Studies:  ECHOCARDIOGRAM COMPLETE  Result Date: 04/07/2022    ECHOCARDIOGRAM REPORT   Patient Name:   SIFAN SHONK Date of Exam: 04/07/2022 Medical Rec #:  [id number]          Height:       60.0 in Accession #:    [id number]         Weight:       163.4 lb Date of Birth:  1940-09-24          BSA:          1.713 m Patient Age:    49 years           BP:           131/60 mmHg Patient Gender: F                  HR:           63 bpm. Exam Location:  Inpatient Procedure: 2D Echo, Color Doppler, Cardiac Doppler and Intracardiac            Opacification Agent Indications:    R07.9* Chest pain, unspecified  History:        Patient has prior history of Echocardiogram examinations, most                 recent 04/07/2021. Risk Factors:Hypertension.  Sonographer:    Raquel Sarna Senior RDCS Referring Phys: [facility]  1. No apical thrombus with Definity contrast. Left ventricular ejection fraction, by estimation, is 35 to 40%. The left ventricle has moderately decreased function. The left ventricle demonstrates regional wall motion abnormalities (see scoring diagram/findings for description). Left ventricular diastolic parameters are consistent with Grade I diastolic dysfunction (impaired relaxation). There is severe akinesis of the left ventricular,  entire anterior wall, anteroseptal wall, apical segment and inferoapical segment. 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 The basal inferior and lateral walls are hyperdynamic.  2. Right ventricular systolic function is mildly reduced. The right ventricular size is normal. Tricuspid regurgitation signal is inadequate for assessing PA pressure.  3. The mitral valve is abnormal. Trivial mitral valve regurgitation.  4. The aortic valve is tricuspid. Aortic valve regurgitation is not visualized.  5. The inferior vena cava is normal in size with greater than 50% respiratory variability, suggesting right atrial pressure of 3 mmHg. Comparison(s): Changes from prior study are noted. 04/07/2021: LVEF 55-60%, normal wall motion. FINDINGS  Left Ventricle: No apical thrombus with Definity contrast. Left ventricular ejection fraction, by estimation, is 35 to 40%. The left ventricle has moderately decreased function. The left ventricle  demonstrates regional wall motion abnormalities. Severe akinesis of the left ventricular, entire anterior wall, anteroseptal wall, apical segment and inferoapical segment. Definity contrast agent was given IV to delineate the left ventricular endocardial borders. The left ventricular internal cavity size was normal in size. There is no left ventricular hypertrophy. Left ventricular diastolic parameters are consistent with Grade I diastolic dysfunction (impaired relaxation). Indeterminate filling pressures. Right Ventricle: The right ventricular size is normal. No increase in right ventricular wall thickness. Right ventricular systolic function is mildly reduced. Tricuspid regurgitation signal is inadequate for assessing PA pressure. Left Atrium: Left atrial size was normal in size. Right Atrium: Right atrial size was normal in size. Pericardium: There is no evidence of pericardial effusion. Mitral Valve: The mitral valve is abnormal. There is mild calcification of the posterior  mitral valve leaflet(s). Mild mitral annular calcification. Trivial mitral valve regurgitation. Tricuspid Valve: The tricuspid valve is grossly normal. Tricuspid valve regurgitation is trivial. Aortic Valve: The aortic valve is tricuspid. Aortic valve regurgitation is not visualized. Pulmonic Valve: The pulmonic valve was normal in structure. Pulmonic valve regurgitation is not visualized. Aorta: The aortic root and ascending aorta are structurally normal, with no evidence of dilitation. Venous: The inferior vena cava is normal in size with greater than 50% respiratory variability, suggesting right atrial pressure of 3 mmHg. IAS/Shunts: There is right bowing of the interatrial septum, suggestive of elevated left atrial pressure. No atrial level shunt detected by color flow Doppler.  LEFT VENTRICLE PLAX 2D LVIDd:         3.20 cm   Diastology LVIDs:         2.40 cm   LV e' medial:    4.57 cm/s LV PW:         1.20 cm   LV E/e' medial:  9.2 LV IVS:        1.00 cm   LV e' lateral:   5.66 cm/s LVOT diam:     1.80 cm   LV E/e' lateral: 7.5 LV SV:         41 LV SV Index:   24 LVOT Area:     2.54 cm  RIGHT VENTRICLE RV S prime:     8.59 cm/s TAPSE (M-mode): 1.6 cm LEFT ATRIUM             Index        RIGHT ATRIUM           Index LA diam:        3.30 cm 1.93 cm/m   RA Area:     10.80 cm LA Vol (A2C):   38.7 ml 22.59 ml/m  RA Volume:   23.30 ml  13.60 ml/m LA Vol (A4C):   33.0 ml 19.26 ml/m LA Biplane Vol: 35.8 ml 20.90 ml/m  AORTIC VALVE LVOT Vmax:   69.80 cm/s LVOT Vmean:  47.800 cm/s LVOT VTI:    0.163 m  AORTA Ao Root diam: 2.90 cm Ao Asc diam:  2.90 cm MITRAL VALVE MV Area (PHT): 1.76 cm     SHUNTS MV Decel Time: 432 msec     Systemic VTI:  0.16 m MV E velocity: 42.20 cm/s   Systemic Diam: 1.80 cm MV A velocity: 112.00 cm/s MV E/A ratio:  0.38 Lyman Bishop MD Electronically signed by Lyman Bishop MD Signature Date/Time: 04/07/2022/4:26:05 PM    Final    CARDIAC CATHETERIZATION  Result Date: 04/07/2022   Prox  LAD to Mid LAD lesion is 100% stenosed.   Prox  RCA lesion is 60% stenosed.   Mid RCA lesion is 90% stenosed.   A drug-eluting stent was successfully placed using a SYNERGY XD 2.75X20.   Post intervention, there is a 0% residual stenosis.   LV end diastolic pressure is severely elevated. 1.  Acute anterior STEMI secondary to thrombotic occlusion of the mid LAD just after the first diagonal, treated with PCI using a 2.75 x 20 mm Synergy DES.  Procedure complicated by no reflow phenomenon likely due to late STEMI presentation with TIMI II flow at the apical portion of the LAD at the completion of the procedure 2.  Severe mid RCA stenosis, recommend staged PCI once she recovers from her anterior infarct 3.  Patent left main and left circumflex with mild nonobstructive plaquing 4.  Severely elevated LVEDP of 28 mmHg consistent with acute systolic heart failure secondary to acute myocardial infarction Recommendations: ICU care, post MI medical therapy, patient loaded with ticagrelor 180 mg at the completion of the procedure, continue cangrelor x2 hours, consider staged PCI of the RCA prior to discharge pending her clinical course.  Vendetta Pittinger check 2D echo to assess degree of LV dysfunction.     Assessment and Plan:   Anterior STEMI: No current chest pain.  Currently on dual antiplatelet therapy with aspirin and Brilinta.  Plan for repeat left heart catheterization for RCA intervention. Acute systolic heart failure: Due to ischemic cardiomyopathy with recent anterior STEMI.  Was having pauses due to atrial fibrillation and postconversion.  Beta-blocker has been stopped.  As she had a severely elevated LVEDP, Mehar Sagen give 1 dose of IV Lasix. 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  Potentially due to left atrial stretch.  She remains in atrial fibrillation, but has had a postconversion pauses.  Of started IV amiodarone.  She has had no further pauses with symptoms over the last 12 hours.  We Alphonza Tramell  continue with current management.  Plan for diuresis to see if this Harim Bi help to control her rhythm.  As she has been loaded on aspirin and Brilinta for her anterior STEMI, would hope to avoid pacemaker implant due to bleeding risk.  We Lazariah Savard start heparin today for anticoagulation and stroke risk.  CHA2DS2-VASc of at least 4.   For questions or updates, please contact [facility] Please consult [email or web address] for contact info under    Signed, Omer Puccinelli Meredith Leeds, MD  04/09/2022 10:48 AM

## 2022-04-09 NOTE — Progress Notes (Signed)
[facility] for heparin Indication: atrial fibrillation  Allergies  Allergen Reactions   Penicillins Rash and Other (See Comments)    Has patient had a PCN reaction causing immediate rash, facial/tongue/throat swelling, SOB or lightheadedness with hypotension: Yes Has patient had a PCN reaction causing severe rash involving mucus membranes or skin necrosis: No Has patient had a PCN reaction that required hospitalization: No Has patient had a PCN reaction occurring within the last 10 years: No If all of the above answers are "NO", then may proceed with Cephalosporin use.    Sulfa Antibiotics Rash    Patient Measurements:     Vital Signs: Temp: 98.2 F (36.8 C) (09/16 1600) BP: 98/64 (09/16 1900) Pulse Rate: 103 (09/16 1900)  Labs: Recent Labs    04/07/22 1044 04/07/22 1306 04/07/22 1341 04/08/22 0646 04/09/22 0103 04/09/22 1816  HGB 11.4*  --  12.8 11.4* 10.4*  --   HCT 34.9*  --  38.3 35.1* 32.2*  --   PLT 308  --  283 289 253  --   APTT >200*  --   --   --   --   --   LABPROT 18.0*  --   --   --   --   --   INR 1.5*  --   --   --   --   --   HEPARINUNFRC  --   --   --   --   --  0.11*  CREATININE 0.73  --   --  0.85 1.18*  --   TROPONINIHS 895* >24,000*  --   --   --   --      Estimated Creatinine Clearance: 32.4 mL/min (A) (by C-G formula based on SCr of 1.18 mg/dL (H)).   Medical History: 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   Multiple, noted on [place]   History of hiatal hernia    Hypertension    Hypothyroidism    Macular degeneration    Psoriatic arthritis ([place])    Sinus drainage    UTI (urinary tract infection)     Assessment: 81yof with CAD s/p DES to LAD and planned DES to RCA next week.  PAF in AFib RVR last night > SR this am on amiodarone.   Initial heparin level low at 0.11 on 700 units/hr.  No bleeding or IV issues noted.   Goal of Therapy:  Heparin level 0.3-0.7 units/ml Monitor platelets by anticoagulation protocol: Yes   Plan:  Increase Heparin drip 900 units/hr  Heparin level in 8hr from change  Daily heparin level and CBC    Erin Hearing PharmD., BCPS Clinical Pharmacist 04/09/2022 7:56 PM

## 2022-04-09 NOTE — Plan of Care (Signed)
  Problem: Clinical Measurements: Goal: Cardiovascular complication will be avoided Outcome: Progressing   Problem: Nutrition: Goal: Adequate nutrition will be maintained Outcome: Progressing   Problem: Coping: Goal: Level of anxiety will decrease Outcome: Progressing   Problem: Cardiac: Goal: Ability to achieve and maintain adequate cardiovascular perfusion will improve Outcome: Progressing   Problem: Cardiovascular: Goal: Vascular access site(s) Level 0-1 will be maintained Outcome: Progressing   Problem: Health Behavior/Discharge Planning: Goal: Ability to safely manage health-related needs after discharge will improve Outcome: Progressing

## 2022-04-10 DIAGNOSIS — I2102 ST elevation (STEMI) myocardial infarction involving left anterior descending coronary artery: Secondary | ICD-10-CM | POA: Diagnosis not present

## 2022-04-10 LAB — CBC
HCT: 31.1 % — ABNORMAL LOW (ref 36.0–46.0)
Hemoglobin: 10.2 g/dL — ABNORMAL LOW (ref 12.0–15.0)
MCH: 28 pg (ref 26.0–34.0)
MCHC: 32.8 g/dL (ref 30.0–36.0)
MCV: 85.4 fL (ref 80.0–100.0)
Platelets: 296 10*3/uL (ref 150–400)
RBC: 3.64 MIL/uL — ABNORMAL LOW (ref 3.87–5.11)
RDW: 13.6 % (ref 11.5–15.5)
WBC: 15.7 10*3/uL — ABNORMAL HIGH (ref 4.0–10.5)
nRBC: 0 % (ref 0.0–0.2)

## 2022-04-10 LAB — BASIC METABOLIC PANEL
Anion gap: 12 (ref 5–15)
BUN: 22 mg/dL (ref 8–23)
CO2: 20 mmol/L — ABNORMAL LOW (ref 22–32)
Calcium: 8.3 mg/dL — ABNORMAL LOW (ref 8.9–10.3)
Chloride: 95 mmol/L — ABNORMAL LOW (ref 98–111)
Creatinine, Ser: 1.13 mg/dL — ABNORMAL HIGH (ref 0.44–1.00)
GFR, Estimated: 48 mL/min — ABNORMAL LOW (ref >60)
Glucose, Bld: 157 mg/dL — ABNORMAL HIGH (ref 70–99)
Potassium: 3.3 mmol/L — ABNORMAL LOW (ref 3.5–5.1)
Sodium: 127 mmol/L — ABNORMAL LOW (ref 135–145)

## 2022-04-10 LAB — HEPARIN LEVEL (UNFRACTIONATED)
Heparin Unfractionated: <0.1 IU/mL — ABNORMAL LOW (ref 0.30–0.70)
Heparin Unfractionated: 0.32 IU/mL (ref 0.30–0.70)

## 2022-04-10 MED ORDER — POTASSIUM CHLORIDE CRYS ER 20 MEQ PO TBCR
30.0000 meq | EXTENDED_RELEASE_TABLET | ORAL | Status: AC
  Administered 2022-04-10 (×3): 30 meq via ORAL
  Filled 2022-04-10 (×3): qty 1

## 2022-04-10 NOTE — Progress Notes (Signed)
Rounding Note    Patient Name: Azarya Oconnell Date of Encounter: 04/10/2022  [facility] Cardiologist: Little Ishikawa, MD   Subjective   Remains in atrial fibrillation.  No other complaint.  Inpatient Medications    Scheduled Meds:  aspirin EC  81 mg Oral Daily   atorvastatin  80 mg Oral Daily   Chlorhexidine Gluconate Cloth  6 each Topical Daily   clopidogrel  75 mg Oral Daily   levothyroxine  75 mcg Oral QAC breakfast   sertraline  100 mg Oral Daily   sodium chloride flush  3 mL Intravenous Q12H   Continuous Infusions:  sodium chloride Stopped (04/08/22 0825)   amiodarone 30 mg/hr (04/10/22 0800)   heparin 900 Units/hr (04/10/22 0807)   PRN Meds: sodium chloride, acetaminophen, morphine injection, nitroGLYCERIN, ondansetron (ZOFRAN) IV, mouth rinse, oxyCODONE, sodium chloride flush   Vital Signs    Vitals:   04/10/22 0400 04/10/22 0407 04/10/22 0500 04/10/22 0800  BP: (!) 84/30 111/74 91/74 121/70  Pulse: (!) 103 (!) 107 94   Resp: 20 (!) 23 11   Temp:    97.9 F (36.6 C)  TempSrc:    Oral  SpO2: 92% 91% 94%     Intake/Output Summary (Last 24 hours) at 04/10/2022 0823 Last data filed at 04/10/2022 0800 Gross per 24 hour  Intake 561.89 ml  Output 1200 ml  Net -638.11 ml       03/30/2022    2:55 PM 01/10/2022   10:00 AM 01/04/2022    9:41 AM  Last 3 Weights  Weight (lbs) 163 lb 6.4 oz 162 lb 162 lb  Weight (kg) 74.118 kg 73.483 kg 73.483 kg      Telemetry    Sinus, PVCs, short runs of NSVT - Personally Reviewed  ECG    Sinus PVCs, evolving anterior MI, incomplete RBBB - Personally Reviewed  Physical Exam   GEN: No acute distress.   Neck: + JVD Cardiac: RRR, no murmurs, rubs, or gallops.  Respiratory: Clear to auscultation bilaterally. GI: Soft, nontender, non-distended  MS: No edema; No deformity. Neuro:  Nonfocal  Psych: Normal affect   Labs    High Sensitivity Troponin:   Recent Labs  Lab 04/07/22 1044  04/07/22 1306  TROPONINIHS 895* >24,000*      Chemistry Recent Labs  Lab 04/07/22 1044 04/08/22 0646 04/09/22 0103  NA 134* 133* 130*  K 3.6 5.1 4.6  CL 105 102 97*  CO2 22 20* 22  GLUCOSE 143* 131* 153*  BUN 15 18 23   CREATININE 0.73 0.85 1.18*  CALCIUM 8.8* 9.2 8.9  MG  --   --  1.7  PROT 5.8*  --   --   ALBUMIN 3.5  --   --   AST 25  --   --   ALT 16  --   --   ALKPHOS 99  --   --   BILITOT 0.8  --   --   GFRNONAA >60 >60 46*  ANIONGAP 7 11 11      Lipids  Recent Labs  Lab 04/07/22 1044  CHOL 125  TRIG 103  HDL 50  LDLCALC 54  CHOLHDL 2.5     Hematology Recent Labs  Lab 04/08/22 0646 04/09/22 0103 04/10/22 0752  WBC 22.7* 22.7* 15.7*  RBC 4.02 3.70* 3.64*  HGB 11.4* 10.4* 10.2*  HCT 35.1* 32.2* 31.1*  MCV 87.3 87.0 85.4  MCH 28.4 28.1 28.0  MCHC 32.5 32.3 32.8  RDW 13.6  13.8 13.6  PLT 289 253 296    Thyroid  Recent Labs  Lab 04/08/22 0646  TSH 0.773    BNP Recent Labs  Lab 04/07/22 1044  BNP 126.5*     DDimer No results for input(s): "DDIMER" in the last 168 hours.   Radiology    No results found.  Cardiac Studies   See above  Patient Profile     81 y.o. female with history of paroxysmal atrial flutter not on anti-coagulation, GERD, HTN and hypothyroidism admitted 04/07/22 with an anterior STEMI. Cardiac cath 04/07/22 with thrombotic occlusion of the mid LAD. Also with severe mid RCA stenosis. LVEDP 28 mmHg.   Assessment & Plan    1.  Coronary artery disease: Anterior STEMI.  Drug-eluting stent to the LAD with 100% occlusion.  We Brinlee Gambrell continue dual antiplatelet therapy with aspirin and Plavix.  No Brilinta as he has been started on anticoagulation due to atrial fibrillation.  Has significant RCA disease.  Plan for left heart catheterization and stenting of the RCA tomorrow.  2.  Acute systolic heart failure: Due to anterior STEMI.  We Trinidi Toppins continue dual antiplatelet therapy.  Sheree Lalla need optimal medical therapy for heart failure  at discharge.  We Aleck Locklin add metoprolol as she is in atrial fibrillation.  3.  Atrial fibrillation: New onset.  Has been started on heparin.  No further pauses noted.  Pauses were postconversion.   For questions or updates, please contact [place] Please consult [email or web address] for contact info under       Signed, Rashell Shambaugh Meredith Leeds, MD  04/10/2022, 8:23 AM

## 2022-04-10 NOTE — Progress Notes (Addendum)
PIV consult with lab draw: RUE with extensive bruising in wrist/ forearm area. Long 22g placed with USG in L forearm.   LABS: Amiodarone infusion via hand PIV paused x2 minutes, 5 mL waste drawn before obtaining labs from new site.

## 2022-04-10 NOTE — Progress Notes (Signed)
Pt admitted to [facility] from [facility] 2H.  Pt is A&O X4 and neuro intact.  Pt placed on telemetry and CCMD notified.  Vitals taken and within normal range.  Pt is oriented to unit with call light in reach.  Pt is currently comfortable and not in pain.    04/10/22 1827  Vitals  Temp 98.7 F (37.1 C)  Temp Source Oral  BP 127/65  MAP (mmHg) 81  BP Location Right Arm  BP Method Automatic  Patient Position (if appropriate) Lying  Pulse Rate 88  Pulse Rate Source Monitor  ECG Heart Rate 87  Resp 20  Level of Consciousness  Level of Consciousness Alert  Oxygen Therapy  SpO2 99 %  O2 Device Room Air  Pain Assessment  Pain Scale 0-10  Pain Score 0  POSS Scale (Pasero Opioid Sedation Scale)  POSS *See Group Information* 1-Acceptable,Awake and alert  Glasgow Coma Scale  Eye Opening 4  Best Verbal Response (NON-intubated) 5  Best Motor Response 6  Glasgow Coma Scale Score 15  MEWS Score  MEWS Temp 0  MEWS Systolic 0  MEWS Pulse 0  MEWS RR 0  MEWS LOC 0  MEWS Score 0  MEWS Score Color Green

## 2022-04-10 NOTE — Progress Notes (Signed)
[place] for heparin Indication: atrial fibrillation  Allergies  Allergen Reactions   Penicillins Rash and Other (See Comments)    Has patient had a PCN reaction causing immediate rash, facial/tongue/throat swelling, SOB or lightheadedness with hypotension: Yes Has patient had a PCN reaction causing severe rash involving mucus membranes or skin necrosis: No Has patient had a PCN reaction that required hospitalization: No Has patient had a PCN reaction occurring within the last 10 years: No If all of the above answers are "NO", then may proceed with Cephalosporin use.    Sulfa Antibiotics Rash    Patient Measurements:     Vital Signs: Temp: 97.9 F (36.6 C) (09/17 0800) Temp Source: Oral (09/17 0800) BP: 124/64 (09/17 1500) Pulse Rate: 85 (09/17 1500)  Labs: Recent Labs    04/08/22 0646 04/09/22 0103 04/09/22 1816 04/10/22 0752 04/10/22 1435  HGB 11.4* 10.4*  --  10.2*  --   HCT 35.1* 32.2*  --  31.1*  --   PLT 289 253  --  296  --   HEPARINUNFRC  --   --  0.11* <0.10* 0.32  CREATININE 0.85 1.18*  --  1.13*  --      Estimated Creatinine Clearance: 33.9 mL/min (A) (by C-G formula based on SCr of 1.13 mg/dL (H)).   Medical History: 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   Multiple, noted on [place]   History of hiatal hernia    Hypertension    Hypothyroidism    Macular degeneration    Psoriatic arthritis ([place])    Sinus drainage    UTI (urinary tract infection)     Assessment: 81yof with CAD s/p DES to LAD and planned DES to RCA next week.  PAF in AFib RVR last night > SR this am on amiodarone.   Follow heparin level at goal 0.32 on 900 units/hr. No bleeding issues noted, cbc stable overnight. Patient did have IV infiltrate earlier but everything appears to be running fine now.   Goal of Therapy:  Heparin  level 0.3-0.7 units/ml Monitor platelets by anticoagulation protocol: Yes   Plan:  Continue Heparin drip at 900 units/hr  Daily heparin level and CBC    Erin Hearing PharmD., BCPS Clinical Pharmacist 04/10/2022 4:08 PM

## 2022-04-11 ENCOUNTER — Encounter (HOSPITAL_COMMUNITY)
Admission: EM | Disposition: A | Discharge status: Home / Self Care | Payer: Self-pay | Source: Home / Self Care | Attending: Cardiovascular Disease

## 2022-04-11 ENCOUNTER — Other Ambulatory Visit (HOSPITAL_COMMUNITY): Payer: Self-pay

## 2022-04-11 ENCOUNTER — Encounter (HOSPITAL_COMMUNITY): Payer: Self-pay | Admitting: Cardiovascular Disease

## 2022-04-11 ENCOUNTER — Telehealth (HOSPITAL_COMMUNITY): Payer: Self-pay | Admitting: Pharmacy Technician

## 2022-04-11 DIAGNOSIS — I480 Paroxysmal atrial fibrillation: Secondary | ICD-10-CM | POA: Diagnosis not present

## 2022-04-11 DIAGNOSIS — I2102 ST elevation (STEMI) myocardial infarction involving left anterior descending coronary artery: Secondary | ICD-10-CM | POA: Diagnosis not present

## 2022-04-11 DIAGNOSIS — E785 Hyperlipidemia, unspecified: Secondary | ICD-10-CM | POA: Diagnosis not present

## 2022-04-11 DIAGNOSIS — I2510 Atherosclerotic heart disease of native coronary artery without angina pectoris: Secondary | ICD-10-CM | POA: Diagnosis not present

## 2022-04-11 DIAGNOSIS — I255 Ischemic cardiomyopathy: Secondary | ICD-10-CM | POA: Diagnosis not present

## 2022-04-11 HISTORY — PX: CORONARY STENT INTERVENTION: CATH118234

## 2022-04-11 LAB — BASIC METABOLIC PANEL
Anion gap: 8 (ref 5–15)
BUN: 21 mg/dL (ref 8–23)
CO2: 23 mmol/L (ref 22–32)
Calcium: 8.6 mg/dL — ABNORMAL LOW (ref 8.9–10.3)
Chloride: 101 mmol/L (ref 98–111)
Creatinine, Ser: 0.98 mg/dL (ref 0.44–1.00)
GFR, Estimated: 57 mL/min — ABNORMAL LOW (ref >60)
Glucose, Bld: 107 mg/dL — ABNORMAL HIGH (ref 70–99)
Potassium: 4.9 mmol/L (ref 3.5–5.1)
Sodium: 132 mmol/L — ABNORMAL LOW (ref 135–145)

## 2022-04-11 LAB — HEPARIN LEVEL (UNFRACTIONATED): Heparin Unfractionated: 0.3 IU/mL (ref 0.30–0.70)

## 2022-04-11 LAB — CBC
HCT: 28.2 % — ABNORMAL LOW (ref 36.0–46.0)
Hemoglobin: 9.8 g/dL — ABNORMAL LOW (ref 12.0–15.0)
MCH: 28.8 pg (ref 26.0–34.0)
MCHC: 34.8 g/dL (ref 30.0–36.0)
MCV: 82.9 fL (ref 80.0–100.0)
Platelets: 264 10*3/uL (ref 150–400)
RBC: 3.4 MIL/uL — ABNORMAL LOW (ref 3.87–5.11)
RDW: 13.7 % (ref 11.5–15.5)
WBC: 13.4 10*3/uL — ABNORMAL HIGH (ref 4.0–10.5)
nRBC: 0 % (ref 0.0–0.2)

## 2022-04-11 LAB — POCT ACTIVATED CLOTTING TIME: Activated Clotting Time: 263 seconds

## 2022-04-11 SURGERY — CORONARY STENT INTERVENTION
Anesthesia: LOCAL

## 2022-04-11 MED ORDER — LIDOCAINE HCL (PF) 1 % IJ SOLN
INTRAMUSCULAR | Status: AC
  Filled 2022-04-11: qty 30

## 2022-04-11 MED ORDER — HEPARIN (PORCINE) IN NACL 1000-0.9 UT/500ML-% IV SOLN
INTRAVENOUS | Status: AC
  Filled 2022-04-11: qty 1000

## 2022-04-11 MED ORDER — ASPIRIN 81 MG PO CHEW
81.0000 mg | CHEWABLE_TABLET | ORAL | Status: AC
  Administered 2022-04-11: 81 mg via ORAL
  Filled 2022-04-11: qty 1

## 2022-04-11 MED ORDER — SODIUM CHLORIDE 0.9% FLUSH: 3.0000 mL | INTRAVENOUS | Status: DC | PRN

## 2022-04-11 MED ORDER — SODIUM CHLORIDE 0.9 % WEIGHT BASED INFUSION
3.0000 mL/kg/h | INTRAVENOUS | Status: DC
  Administered 2022-04-11: 3 mL/kg/h via INTRAVENOUS

## 2022-04-11 MED ORDER — HYDRALAZINE HCL 20 MG/ML IJ SOLN: 10.0000 mg | INTRAMUSCULAR | Status: AC | PRN

## 2022-04-11 MED ORDER — AMIODARONE HCL IN DEXTROSE 360-4.14 MG/200ML-% IV SOLN
INTRAVENOUS | Status: AC
  Filled 2022-04-11: qty 200

## 2022-04-11 MED ORDER — SODIUM CHLORIDE 0.9 % IV SOLN: INTRAVENOUS | Status: AC

## 2022-04-11 MED ORDER — APIXABAN 5 MG PO TABS
5.0000 mg | ORAL_TABLET | Freq: Two times a day (BID) | ORAL | Status: DC
  Administered 2022-04-11 – 2022-04-14 (×6): 5 mg via ORAL
  Filled 2022-04-11 (×6): qty 1

## 2022-04-11 MED ORDER — EMPAGLIFLOZIN 10 MG PO TABS
10.0000 mg | ORAL_TABLET | Freq: Every day | ORAL | Status: DC
  Administered 2022-04-12 – 2022-04-14 (×3): 10 mg via ORAL
  Filled 2022-04-11 (×4): qty 1

## 2022-04-11 MED ORDER — NITROGLYCERIN 1 MG/10 ML FOR IR/CATH LAB
INTRA_ARTERIAL | Status: DC | PRN
  Administered 2022-04-11: 150 ug via INTRACORONARY

## 2022-04-11 MED ORDER — SODIUM CHLORIDE 0.9 % WEIGHT BASED INFUSION: 3.0000 mL/kg/h | INTRAVENOUS | Status: DC

## 2022-04-11 MED ORDER — NITROGLYCERIN 1 MG/10 ML FOR IR/CATH LAB
INTRA_ARTERIAL | Status: AC
  Filled 2022-04-11: qty 10

## 2022-04-11 MED ORDER — SODIUM CHLORIDE 0.9% FLUSH: 3.0000 mL | Freq: Two times a day (BID) | INTRAVENOUS | Status: DC

## 2022-04-11 MED ORDER — ASPIRIN 81 MG PO CHEW: 81.0000 mg | CHEWABLE_TABLET | ORAL | Status: DC

## 2022-04-11 MED ORDER — LIDOCAINE HCL (PF) 1 % IJ SOLN
INTRAMUSCULAR | Status: DC | PRN
  Administered 2022-04-11: 2 mL

## 2022-04-11 MED ORDER — MIDAZOLAM HCL 2 MG/2ML IJ SOLN
INTRAMUSCULAR | Status: DC | PRN
  Administered 2022-04-11 (×2): 1 mg via INTRAVENOUS

## 2022-04-11 MED ORDER — AMIODARONE HCL 200 MG PO TABS
200.0000 mg | ORAL_TABLET | Freq: Two times a day (BID) | ORAL | Status: DC
  Administered 2022-04-11 – 2022-04-12 (×3): 200 mg via ORAL
  Filled 2022-04-11 (×3): qty 1

## 2022-04-11 MED ORDER — SODIUM CHLORIDE 0.9 % WEIGHT BASED INFUSION: 1.0000 mL/kg/h | INTRAVENOUS | Status: DC

## 2022-04-11 MED ORDER — SODIUM CHLORIDE 0.9 % IV SOLN
250.0000 mL | INTRAVENOUS | Status: DC | PRN
  Administered 2022-04-11: 250 mL via INTRAVENOUS

## 2022-04-11 MED ORDER — LABETALOL HCL 5 MG/ML IV SOLN: 10.0000 mg | INTRAVENOUS | Status: AC | PRN

## 2022-04-11 MED ORDER — HEPARIN SODIUM (PORCINE) 1000 UNIT/ML IJ SOLN
INTRAMUSCULAR | Status: DC | PRN
  Administered 2022-04-11: 3000 [IU] via INTRAVENOUS
  Administered 2022-04-11: 7000 [IU] via INTRAVENOUS

## 2022-04-11 MED ORDER — FENTANYL CITRATE (PF) 100 MCG/2ML IJ SOLN
INTRAMUSCULAR | Status: AC
  Filled 2022-04-11: qty 2

## 2022-04-11 MED ORDER — SODIUM CHLORIDE 0.9 % WEIGHT BASED INFUSION
1.0000 mL/kg/h | INTRAVENOUS | Status: DC
  Administered 2022-04-11: 1 mL/kg/h via INTRAVENOUS

## 2022-04-11 MED ORDER — HEPARIN (PORCINE) IN NACL 1000-0.9 UT/500ML-% IV SOLN
INTRAVENOUS | Status: DC | PRN
  Administered 2022-04-11 (×2): 500 mL

## 2022-04-11 MED ORDER — VERAPAMIL HCL 2.5 MG/ML IV SOLN
INTRAVENOUS | Status: AC
  Filled 2022-04-11: qty 2

## 2022-04-11 MED ORDER — HEPARIN SODIUM (PORCINE) 1000 UNIT/ML IJ SOLN
INTRAMUSCULAR | Status: AC
  Filled 2022-04-11: qty 10

## 2022-04-11 MED ORDER — IOHEXOL 350 MG/ML SOLN
INTRAVENOUS | Status: DC | PRN
  Administered 2022-04-11: 95 mL

## 2022-04-11 MED ORDER — SODIUM CHLORIDE 0.9% FLUSH
3.0000 mL | Freq: Two times a day (BID) | INTRAVENOUS | Status: DC
  Administered 2022-04-11 – 2022-04-14 (×6): 3 mL via INTRAVENOUS

## 2022-04-11 MED ORDER — MIDAZOLAM HCL 2 MG/2ML IJ SOLN
INTRAMUSCULAR | Status: AC
  Filled 2022-04-11: qty 2

## 2022-04-11 MED ORDER — VERAPAMIL HCL 2.5 MG/ML IV SOLN
INTRAVENOUS | Status: DC | PRN
  Administered 2022-04-11: 10 mL via INTRA_ARTERIAL

## 2022-04-11 MED ORDER — FENTANYL CITRATE (PF) 100 MCG/2ML IJ SOLN
INTRAMUSCULAR | Status: DC | PRN
  Administered 2022-04-11 (×2): 25 ug via INTRAVENOUS

## 2022-04-11 MED ORDER — SODIUM CHLORIDE 0.9 % IV SOLN: 250.0000 mL | INTRAVENOUS | Status: DC | PRN

## 2022-04-11 SURGICAL SUPPLY — 22 items
BALLN SAPPHIRE 2.5X15 (BALLOONS) ×1
BALLN ~~LOC~~ EUPHORA RX 3.75X27 (BALLOONS) ×1
BALLOON SAPPHIRE 2.5X15 (BALLOONS) IMPLANT
BALLOON ~~LOC~~ EUPHORA RX 3.75X27 (BALLOONS) IMPLANT
BAND CMPR LRG ZPHR (HEMOSTASIS)
BAND ZEPHYR COMPRESS 30 LONG (HEMOSTASIS) IMPLANT
CATH INFINITI 5FR JL4 (CATHETERS) IMPLANT
CATH VISTA GUIDE 6FR JR4 (CATHETERS) IMPLANT
DEVICE RAD COMP TR BAND LRG (VASCULAR PRODUCTS) IMPLANT
ELECT DEFIB PAD ADLT CADENCE (PAD) IMPLANT
GLIDESHEATH SLEND SS 6F .021 (SHEATH) IMPLANT
GUIDEWIRE INQWIRE 1.5J.035X260 (WIRE) IMPLANT
INQWIRE 1.5J .035X260CM (WIRE) ×1
KIT ENCORE 26 ADVANTAGE (KITS) IMPLANT
KIT HEART LEFT (KITS) ×1 IMPLANT
PACK CARDIAC CATHETERIZATION (CUSTOM PROCEDURE TRAY) ×1 IMPLANT
STENT SYNERGY XD 3.50X38 (Permanent Stent) IMPLANT
SYNERGY XD 3.50X38 (Permanent Stent) ×1 IMPLANT
TRANSDUCER W/STOPCOCK (MISCELLANEOUS) ×1 IMPLANT
TUBING CIL FLEX 10 FLL-RA (TUBING) ×1 IMPLANT
VALVE GUARDIAN II ~~LOC~~ HEMO (MISCELLANEOUS) IMPLANT
WIRE COUGAR XT STRL 190CM (WIRE) IMPLANT

## 2022-04-11 NOTE — Progress Notes (Signed)
Heart Failure Nurse Navigator Progress Note  PCP: Shon Hale, MD PCP-Cardiologist: Bjorn Pippin Admission Diagnosis: Stemi Admitted from: Home via EMS  Presentation:   Connie [name] presented with substernal and epigastric sharp chest pain with anterior STEMI, nausea, vomiting, and diaphoresis. In the past she has declined anticoagulation for her paroxysmal atrial flutter. A code STEMI was called and patient went straight to the Cath Lab on 04/07/2022 for emergent cardiac catheterization and PCI. Mid LAD occluded.with severe mild RCA stenosis.  BNP 126.5, Troponin > 24,000.   Patient is a very pleasant woman, who lives with her husband. Education was done on the sign and symptoms of heart failure, daily weights, when to call her doctor or go to the ER. Diet/ fluid restrictions ( patient stated that she does about 2 Pepsi's a day) and understands the need to cut back. Spoke of the importNCE OF TAKING ALL MEDICATIONS AS PRESCRIBED AND ATTENDING ALL MEDICAL APPOINTMENTS. PATIENT VERBALIZED HER UNDERSTANDING AND A HOSPITAL HF toc FOLLOW UP WAS SCHEDULED FOR 04/20/2022 @ 9 AM.   ECHO/ LVEF: EF 35-40% G1DD  Clinical Course:  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   Multiple, noted on [place]   History of hiatal hernia    Hypertension    Hypothyroidism    Macular degeneration    Psoriatic arthritis ([facility])    Sinus drainage    UTI (urinary tract infection)      Social History   Socioeconomic History   Marital status: Married    Spouse name: [name]   Number of children: 2   Years of education: Not on file   Highest education level: Associate degree: academic program  Occupational History   Occupation: Retired  Tobacco Use   Smoking status: Never   Smokeless tobacco: Never  Vaping Use   Vaping Use: Never used  Substance and Sexual Activity   Alcohol use: Yes     Comment: rarely wine   Drug use: No   Sexual activity: Not on file  Other Topics Concern   Not on file  Social History Narrative   Not on file   Social Determinants of Health   Financial Resource Strain: Low Risk  (04/11/2022)   Overall Financial Resource Strain (CARDIA)    Difficulty of Paying Living Expenses: Not hard at all  Food Insecurity: No Food Insecurity (04/11/2022)   Hunger Vital Sign    Worried About Running Out of Food in the Last Year: Never true    Ran Out of Food in the Last Year: Never true  Transportation Needs: No Transportation Needs (04/11/2022)   PRAPARE - Administrator, Civil Service (Medical): No    Lack of Transportation (Non-Medical): No  Physical Activity: Not on file  Stress: Not on file  Social Connections: Not on file   Education Assessment and Provision:  Detailed education and instructions provided on heart failure disease management including the following:  Signs and symptoms of Heart Failure When to call the physician Importance of daily weights Low sodium diet Fluid restriction Medication management Anticipated future follow-up appointments  Patient education given on each of the above topics.  Patient acknowledges understanding via teach back method and acceptance of all instructions.  Education Materials:  "Living Better With Heart Failure" Booklet, HF zone tool, & Daily Weight Tracker Tool.  Patient has scale at home: yes Patient  has pill box at home: yes    High Risk Criteria for Readmission and/or Poor Patient Outcomes: Heart failure hospital admissions (last 6 months): 0  No Show rate: 0 Difficult social situation: No Demonstrates medication adherence: Yes Primary Language: English Literacy level: Reading, writing, and comprehension.   Barriers of Care:   Diet/ fluid restrictions ( Pepsi x 2 day) Daily weights  Considerations/Referrals:   Referral made to Heart Failure Pharmacist Stewardship: Yes Referral  made to Heart Failure CSW/NCM TOC: No Referral made to Heart & Vascular TOC clinic: Yes, 04/20/2022 @ 9 am.   Items for Follow-up on DC/TOC: Diet/ fluid restrictions ( Pepsi x 2 per day) Daily weights   Earnestine Leys, BSN, RN Heart Failure Transport planner Only

## 2022-04-11 NOTE — Care Management Important Message (Signed)
Important Message  Patient Details  Name: Connie [name] MRN: [id number] Date of Birth: 02/06/1941   Medicare Important Message Given:  Yes     Shelda Altes 04/11/2022, 9:01 AM

## 2022-04-11 NOTE — Progress Notes (Signed)
Cardiology nurse practitioner called this RN back and told RN to continue to watch HR and page back if heart rate sustained in the 130s to 140s. Will continue to monitor.  Connie [name]

## 2022-04-11 NOTE — Progress Notes (Signed)
Heart Failure Stewardship Pharmacist Progress Note   PCP: Glenis Smoker, MD PCP-Cardiologist: Donato Heinz, MD    HPI:  81 yo F with PMH of afib, HTN, hypothyroidism, GERD, and depression. She was admitted on 9/14 with anterior STEMI s/p DES to mid LAD. She was also noted to have mid RCA stenosis with recommended PCI once recovered from anterior infarct. ECHO on 9/14 with LVEF 35-40%, G1DD, severe akinesis of LV and anterior wall, RV mildly reduced. Hospitalization complicated by afib with tachy-brady syndrome and post-conversion pauses. She went back to the cath lab on 9/18 for staged RCA stent.   Current HF Medications: SGLT2i: Jardiance 10 mg daily  Prior to admission HF Medications: Beta blocker: carvedilol 6.25 mg BID ACE/ARB/ARNI: irbesartan 150 mg daily  Pertinent Lab Values: Serum creatinine 0.98, BUN 21, Potassium 4.9, Sodium 132, BNP 126.5, Magnesium 1.7, A1c 5.6   Vital Signs: Weight: 165 lbs (admission weight not collected) Blood pressure: 110/50s  Heart rate: 70-80s  I/O: -1L yesterday; net even  Medication Assistance / Insurance Benefits Check: Does the patient have prescription insurance?  Yes Type of insurance plan: [employer] Medicare  Does the patient qualify for medication assistance through manufacturers or grants?   Pending Eligible grants and/or patient assistance programs: pending Medication assistance applications in progress: none  Medication assistance applications approved: none Approved medication assistance renewals will be completed by: pending  Outpatient Pharmacy:  Prior to admission outpatient pharmacy: [facility] Is the patient willing to use [place] pharmacy at discharge? Yes Is the patient willing to transition their outpatient pharmacy to utilize a [facility] outpatient pharmacy?   Pending    Assessment: 1. Acute systolic CHF (LVEF 44-31%), due to ICM. NYHA class II symptoms. - Euvolemic on exam, no indication for  diuretics. Strict I/Os and daily weights. Keep K>4 and Mag>2. - Was placed on metoprolol but this was stopped with symptomatic pauses on 9/15. Consider restarting carvedilol 6.25 mg BID post cath if rhythm remains stable. - Consider starting low dose ARB + spironolactone tomorrow if renal function stable - Agree with starting Jardiance 10 mg daily today   Plan: 1) Medication changes recommended at this time: - Start low dose ARB + spironolactone 12.5 mg tomorrow if renal function stable  2) Patient assistance: - Jardiance copay $40 - Entresto copay $40  3)  Education  - To be completed prior to discharge  Kerby Nora, PharmD, BCPS Heart Failure Cytogeneticist Phone [phone]

## 2022-04-11 NOTE — Progress Notes (Signed)
CCMD called this RN to inform that patient converted back into atrial fibrillation. Patient asymptomatic. Paged MD. Waiting for response.  TR band removed per order without complications.  [place] C Marcella Dunnaway

## 2022-04-11 NOTE — Discharge Instructions (Signed)

## 2022-04-11 NOTE — Progress Notes (Signed)
CARDIAC REHAB PHASE I   Pt resting in bed with family at bedside. Post MI/stent education including risk factors, restrictions, heart healthy diet, risk factors, antiplatelet therapy importance, site care, MI booklet and CRP2 reviewed. All question and concerns addressed. Plan for return to cath lab later today. Will continue to follow. Will refer to [facility] for CRP2.   [phone]  Vanessa Barbara, RN BSN 04/11/2022 9:56 AM

## 2022-04-11 NOTE — Progress Notes (Signed)
[place] [facility] for heparin Indication: atrial fibrillation  Allergies  Allergen Reactions   Penicillins Rash and Other (See Comments)    Has patient had a PCN reaction causing immediate rash, facial/tongue/throat swelling, SOB or lightheadedness with hypotension: Yes Has patient had a PCN reaction causing severe rash involving mucus membranes or skin necrosis: No Has patient had a PCN reaction that required hospitalization: No Has patient had a PCN reaction occurring within the last 10 years: No If all of the above answers are "NO", then may proceed with Cephalosporin use.    Sulfa Antibiotics Rash    Patient Measurements: Weight: 75.1 kg (165 lb 9.1 oz)   Vital Signs: Temp: 98.3 F (36.8 C) (09/18 0818) Temp Source: Oral (09/18 0818) BP: 114/59 (09/18 0818) Pulse Rate: 86 (09/18 0818)  Labs: Recent Labs    04/09/22 0103 04/09/22 1816 04/10/22 0752 04/10/22 1435 04/11/22 0316  HGB 10.4*  --  10.2*  --  9.8*  HCT 32.2*  --  31.1*  --  28.2*  PLT 253  --  296  --  264  HEPARINUNFRC  --    < > <0.10* 0.32 0.30  CREATININE 1.18*  --  1.13*  --  0.98   < > = values in this interval not displayed.     Estimated Creatinine Clearance: 39.3 mL/min (by C-G formula based on SCr of 0.98 mg/dL).   Medical History: 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   Multiple, noted on [place]   History of hiatal hernia    Hypertension    Hypothyroidism    Macular degeneration    Psoriatic arthritis ([place])    Sinus drainage    UTI (urinary tract infection)     Assessment: 81yof with CAD s/p DES to LAD and planned DES to RCA today. She is also noted with afib and plans are for oral anticoagulation  heparin level at goal 0.30 on 900 units/hr.   Goal of Therapy:  Heparin level 0.3-0.7 units/ml Monitor platelets by anticoagulation protocol:  Yes   Plan:  Continue Heparin drip at 900 units/hr  Will follow plans post cath  Hildred Laser, PharmD Clinical Pharmacist **Pharmacist phone directory can now be found on [email or web address] ([email or web address]).  Listed under [place].

## 2022-04-11 NOTE — Telephone Encounter (Signed)
Pharmacy Patient Advocate Encounter  Insurance verification completed.    The patient is insured through [facility]   The patient is currently admitted and ran test claims for the following: Eliquis; Xarelto; Entresto; Wilder GladeVania Rea.  Copays and coinsurance results were relayed to Inpatient clinical team.

## 2022-04-11 NOTE — Progress Notes (Signed)
RN report patient had recurrent A fib now with VR 110-128s, felt HR is fast, no other symptoms, BP stable. Continue PO amiodarone. If VR continue rising and sustained, may re-start infusion overnight, no BB due to post conversion pauses, noted tachybradycardia syndrome.

## 2022-04-11 NOTE — Interval H&P Note (Signed)
History and Physical Interval Note:  04/11/2022 10:09 AM  Connie [name]  has presented today for surgery, with the diagnosis of cad.  The various methods of treatment have been discussed with the patient and family. After consideration of risks, benefits and other options for treatment, the patient has consented to  Procedure(s): CORONARY STENT INTERVENTION (N/A) as a surgical intervention.  The patient's history has been reviewed, patient examined, no change in status, stable for surgery.  I have reviewed the patient's chart and labs.  Questions were answered to the patient's satisfaction.    Events over the weekend noted, with atrial fibrillation now changed to clopidogrel and planning to start oral anticoagulation. Otherwise remains stable with no recurrent chest pain. Medically appropriate to proceed with staged PCI of severe stenosis of the RCA.   Sherren Mocha

## 2022-04-11 NOTE — Progress Notes (Signed)
Mobility Specialist Progress Note:   04/11/22 1117  Mobility  Activity Off unit   Will follow-up as time allows.   [facility] Surveyor, mining Chat only

## 2022-04-11 NOTE — TOC Benefit Eligibility Note (Signed)
Patient Research scientist (life sciences) completed.     The patient is currently admitted and upon discharge could be taking Eliquis or Xarelto; Entresto; [place] or  Jardiance.   The current 30 day co-pay is, $40; $40; $40; $64; $40.   The patient is insured through [employer].

## 2022-04-11 NOTE — Progress Notes (Addendum)
Rounding Note    Patient Name: Connie [name] Date of Encounter: 04/11/2022  [place] Cardiologist: Donato Heinz, MD   Subjective   Feeling well this morning. Planned for cardiac cath.   Inpatient Medications    Scheduled Meds:  aspirin EC  81 mg Oral Daily   atorvastatin  80 mg Oral Daily   Chlorhexidine Gluconate Cloth  6 each Topical Daily   clopidogrel  75 mg Oral Daily   levothyroxine  75 mcg Oral QAC breakfast   sertraline  100 mg Oral Daily   sodium chloride flush  3 mL Intravenous Q12H   sodium chloride flush  3 mL Intravenous Q12H   Continuous Infusions:  sodium chloride Stopped (04/08/22 0825)   sodium chloride     sodium chloride 1 mL/kg/hr (04/11/22 0658)   amiodarone 30 mg/hr (04/11/22 0658)   heparin 900 Units/hr (04/11/22 0658)   PRN Meds: sodium chloride, sodium chloride, acetaminophen, morphine injection, nitroGLYCERIN, ondansetron (ZOFRAN) IV, mouth rinse, oxyCODONE, sodium chloride flush, sodium chloride flush   Vital Signs    Vitals:   04/10/22 1943 04/10/22 2321 04/11/22 0324 04/11/22 0330  BP: (!) 99/55 112/64 117/62   Pulse: 80 78 94   Resp: 20 18 18    Temp: 98.4 F (36.9 C) 98.2 F (36.8 C) 98.7 F (37.1 C)   TempSrc: Oral Oral Oral   SpO2: 97% 95% 96%   Weight:    75.1 kg    Intake/Output Summary (Last 24 hours) at 04/11/2022 0814 Last data filed at 04/11/2022 0658 Gross per 24 hour  Intake 1005.12 ml  Output 350 ml  Net 655.12 ml      04/11/2022    3:30 AM 03/30/2022    2:55 PM 01/10/2022   10:00 AM  Last 3 Weights  Weight (lbs) 165 lb 9.1 oz 163 lb 6.4 oz 162 lb  Weight (kg) 75.1 kg 74.118 kg 73.483 kg      Telemetry    Sinus Rhythm, rates 80-90s - Personally Reviewed  ECG    No new tracing  Physical Exam   GEN: No acute distress.   Neck: No JVD Cardiac: RRR, no murmurs, rubs, or gallops.  Respiratory: Clear to auscultation bilaterally. GI: Soft, nontender, non-distended  MS: No  edema; No deformity. Ecchymosis noted to right hand/forearm from prior cath.  Neuro:  Nonfocal  Psych: Normal affect   Labs    High Sensitivity Troponin:   Recent Labs  Lab 04/07/22 1044 04/07/22 1306  TROPONINIHS 895* >24,000*     Chemistry Recent Labs  Lab 04/07/22 1044 04/08/22 0646 04/09/22 0103 04/10/22 0752 04/11/22 0316  NA 134*   < > 130* 127* 132*  K 3.6   < > 4.6 3.3* 4.9  CL 105   < > 97* 95* 101  CO2 22   < > 22 20* 23  GLUCOSE 143*   < > 153* 157* 107*  BUN 15   < > 23 22 21   CREATININE 0.73   < > 1.18* 1.13* 0.98  CALCIUM 8.8*   < > 8.9 8.3* 8.6*  MG  --   --  1.7  --   --   PROT 5.8*  --   --   --   --   ALBUMIN 3.5  --   --   --   --   AST 25  --   --   --   --   ALT 16  --   --   --   --  ALKPHOS 99  --   --   --   --   BILITOT 0.8  --   --   --   --   GFRNONAA >60   < > 46* 48* 57*  ANIONGAP 7   < > 11 12 8    < > = values in this interval not displayed.    Lipids  Recent Labs  Lab 04/07/22 1044  CHOL 125  TRIG 103  HDL 50  LDLCALC 54  CHOLHDL 2.5    Hematology Recent Labs  Lab 04/09/22 0103 04/10/22 0752 04/11/22 0316  WBC 22.7* 15.7* 13.4*  RBC 3.70* 3.64* 3.40*  HGB 10.4* 10.2* 9.8*  HCT 32.2* 31.1* 28.2*  MCV 87.0 85.4 82.9  MCH 28.1 28.0 28.8  MCHC 32.3 32.8 34.8  RDW 13.8 13.6 13.7  PLT 253 296 264   Thyroid  Recent Labs  Lab 04/08/22 0646  TSH 0.773    BNP Recent Labs  Lab 04/07/22 1044  BNP 126.5*    DDimer No results for input(s): "DDIMER" in the last 168 hours.   Radiology    No results found.  Cardiac Studies   Cath: 04/07/22    Prox LAD to Mid LAD lesion is 100% stenosed.   Prox RCA lesion is 60% stenosed.   Mid RCA lesion is 90% stenosed.   A drug-eluting stent was successfully placed using a SYNERGY XD 2.75X20.   Post intervention, there is a 0% residual stenosis.   LV end diastolic pressure is severely elevated.   1.  Acute anterior STEMI secondary to thrombotic occlusion of the mid LAD just  after the first diagonal, treated with PCI using a 2.75 x 20 mm Synergy DES.  Procedure complicated by no reflow phenomenon likely due to late STEMI presentation with TIMI II flow at the apical portion of the LAD at the completion of the procedure 2.  Severe mid RCA stenosis, recommend staged PCI once she recovers from her anterior infarct 3.  Patent left main and left circumflex with mild nonobstructive plaquing 4.  Severely elevated LVEDP of 28 mmHg consistent with acute systolic heart failure secondary to acute myocardial infarction   Recommendations: ICU care, post MI medical therapy, patient loaded with ticagrelor 180 mg at the completion of the procedure, continue cangrelor x2 hours, consider staged PCI of the RCA prior to discharge pending her clinical course.  Will check 2D echo to assess degree of LV dysfunction.   Diagnostic Dominance: Right  Intervention   Echo: 04/07/22  IMPRESSIONS     1. No apical thrombus with Definity contrast. Left ventricular ejection  fraction, by estimation, is 35 to 40%. The left ventricle has moderately  decreased function. The left ventricle demonstrates regional wall motion  abnormalities (see scoring  diagram/findings for description). Left ventricular diastolic parameters  are consistent with Grade I diastolic dysfunction (impaired relaxation).  There is severe akinesis of the left ventricular, entire anterior wall,  anteroseptal wall, apical segment  and inferoapical segment. 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 The basal inferior and lateral walls are hyperdynamic.   2. Right ventricular systolic function is mildly reduced. The right  ventricular size is normal. Tricuspid regurgitation signal is inadequate  for assessing PA pressure.   3. The mitral valve is abnormal. Trivial mitral valve regurgitation.   4. The aortic valve is tricuspid. Aortic valve regurgitation is not  visualized.   5. The inferior vena cava is  normal in size with greater than 50%  respiratory  variability, suggesting right atrial pressure of 3 mmHg.   Comparison(s): Changes from prior study are noted. 04/07/2021: LVEF 55-60%,  normal wall motion.   FINDINGS   Left Ventricle: No apical thrombus with Definity contrast. Left  ventricular ejection fraction, by estimation, is 35 to 40%. The left  ventricle has moderately decreased function. The left ventricle  demonstrates regional wall motion abnormalities. Severe  akinesis of the left ventricular, entire anterior wall, anteroseptal wall,  apical segment and inferoapical segment. Definity contrast agent was given  IV to delineate the left ventricular endocardial borders. The left  ventricular internal cavity size was  normal in size. There is no left ventricular hypertrophy. Left ventricular  diastolic parameters are consistent with Grade I diastolic dysfunction  (impaired relaxation). Indeterminate filling pressures.   Right Ventricle: The right ventricular size is normal. No increase in  right ventricular wall thickness. Right ventricular systolic function is  mildly reduced. Tricuspid regurgitation signal is inadequate for assessing  PA pressure.   Left Atrium: Left atrial size was normal in size.   Right Atrium: Right atrial size was normal in size.   Pericardium: There is no evidence of pericardial effusion.   Mitral Valve: The mitral valve is abnormal. There is mild calcification of  the posterior mitral valve leaflet(s). Mild mitral annular calcification.  Trivial mitral valve regurgitation.   Tricuspid Valve: The tricuspid valve is grossly normal. Tricuspid valve  regurgitation is trivial.   Aortic Valve: The aortic valve is tricuspid. Aortic valve regurgitation is  not visualized.   Pulmonic Valve: The pulmonic valve was normal in structure. Pulmonic valve  regurgitation is not visualized.   Aorta: The aortic root and ascending aorta are structurally normal,  with  no evidence of dilitation.   Venous: The inferior vena cava is normal in size with greater than 50%  respiratory variability, suggesting right atrial pressure of 3 mmHg.   IAS/Shunts: There is right bowing of the interatrial septum, suggestive of  elevated left atrial pressure. No atrial level shunt detected by color  flow Doppler.   Patient Profile     81 y.o. female  with history of paroxysmal atrial flutter not on anti-coagulation, GERD, HTN and hypothyroidism admitted 04/07/22 with an anterior STEMI. Cardiac cath 04/07/22 with thrombotic occlusion of the mid LAD. Also with severe mid RCA stenosis. LVEDP 28 mmHg.   Assessment & Plan    Anterior STEMI -- Underwent cardiac catheterization noted above with thrombotic occlusion of mid LAD just after first diagonal treated with PCI/DES x1.  The procedure was complicated by no reflow phenomenon with TIMI II flow at apical portion of LAD at the completion of procedure.  Placed on DAPT with aspirin/Brilinta.  Also with severe mid RCA stenosis with plans for staged PCI today.  Developed paroxysmal atrial fibrillation and switched from Brilinta to Plavix -- Continue aspirin, Plavix, statin (likely triple therapy for one month, then dropping ASA with the need for [place])  ICM HFrEF -- Echo showed LVEF of 35 to [id number], grade 1 diastolic dysfunction, severe akinesis of the LV, anterior wall, septal and apical segments, mildly reduced RV. -- initially placed on metoprolol but stopped with post conversion pauses. Rhythm has been stable, will consider resuming post cath -- add jardiance today, plan for ARB post cath  HLD -- LP(a) 36, LDL 54, HDL 50 -- Continue atorvastatin 40 mg daily  HTN -- Stable, not currently on any antihypertensives  Paroxysmal atrial fib RVR Post conversion pauses -- Does have history of paroxysmal  atrial flutter.  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  Did have postconversion pauses.   Started on IV amiodarone.  Seen by EP, hopeful to avoid pacemaker implant due to bleeding risk with DAPT.  CHA2DS2-VASc score of at least 4.  -- Remains on IV amiodarone and heparin. She has not been on [place] in the past, but now agreeable. Plans to transition to Eliquis post cath (will need for monitor H/H as hgb with mild downward trend)  For questions or updates, please contact [place] Please consult [email or web address] for contact info under        Signed, Reino Bellis, NP  04/11/2022, 8:14 AM     Patient seen and examined. Agree with assessment and plan.  Patient is just back from cardiac catheterization laboratory.  I have reviewed her prior images from her presentation with an anterior STEMI at which time she underwent primary PCI initially complicated by no reflow.  She underwent successful PTCA and stenting of severe tandem lesions in the RCA treated with 3.5 x 38 mm DES stent to her proximal to mid RCA today.  Presently, she is in sinus rhythm with plans for triple drug therapy with ASA/ plavix, and initiation of Eliquis.  After 30 days, aspirin will be discontinued and she will continue with protocol/Eliquis.  Her right radial artery access site from her initial procedure is significantly ecchymotic. The procedure today was from the left radial. TR band in place, currently stable.  Initial EF on echo was 35 to 40% which  hopefully will improve following subsequent revascularizations.  Plan to follow-up ECG in a.m. continue aggressive lipid-lowering therapy.  Follow-up renal function.  Initiate guideline directed medical therapy for reduced EF ending up on renal function and blood pressure.  Troy Sine, MD, [facility] 04/11/2022 11:57 AM

## 2022-04-11 NOTE — Progress Notes (Signed)
Patient returned from cath lab to 4E. Vitals taken and stable. Tele placed and CCMD notified. TR band on left wrist- 13cc of air. Patient alert and oriented. Call bell within reach. Connie [name]

## 2022-04-12 DIAGNOSIS — Z7901 Long term (current) use of anticoagulants: Secondary | ICD-10-CM | POA: Diagnosis not present

## 2022-04-12 DIAGNOSIS — I2102 ST elevation (STEMI) myocardial infarction involving left anterior descending coronary artery: Secondary | ICD-10-CM | POA: Diagnosis not present

## 2022-04-12 DIAGNOSIS — I480 Paroxysmal atrial fibrillation: Secondary | ICD-10-CM | POA: Diagnosis not present

## 2022-04-12 DIAGNOSIS — I255 Ischemic cardiomyopathy: Secondary | ICD-10-CM | POA: Diagnosis not present

## 2022-04-12 LAB — BASIC METABOLIC PANEL
Anion gap: 6 (ref 5–15)
BUN: 15 mg/dL (ref 8–23)
CO2: 22 mmol/L (ref 22–32)
Calcium: 8.5 mg/dL — ABNORMAL LOW (ref 8.9–10.3)
Chloride: 104 mmol/L (ref 98–111)
Creatinine, Ser: 0.89 mg/dL (ref 0.44–1.00)
GFR, Estimated: >60 mL/min (ref >60)
Glucose, Bld: 95 mg/dL (ref 70–99)
Potassium: 4.1 mmol/L (ref 3.5–5.1)
Sodium: 132 mmol/L — ABNORMAL LOW (ref 135–145)

## 2022-04-12 LAB — CBC
HCT: 28.5 % — ABNORMAL LOW (ref 36.0–46.0)
Hemoglobin: 9.3 g/dL — ABNORMAL LOW (ref 12.0–15.0)
MCH: 27.9 pg (ref 26.0–34.0)
MCHC: 32.6 g/dL (ref 30.0–36.0)
MCV: 85.6 fL (ref 80.0–100.0)
Platelets: 292 10*3/uL (ref 150–400)
RBC: 3.33 MIL/uL — ABNORMAL LOW (ref 3.87–5.11)
RDW: 13.9 % (ref 11.5–15.5)
WBC: 12.5 10*3/uL — ABNORMAL HIGH (ref 4.0–10.5)
nRBC: 0 % (ref 0.0–0.2)

## 2022-04-12 MED ORDER — LOSARTAN POTASSIUM 25 MG PO TABS
25.0000 mg | ORAL_TABLET | Freq: Every day | ORAL | Status: DC
  Administered 2022-04-12 – 2022-04-14 (×3): 25 mg via ORAL
  Filled 2022-04-12 (×3): qty 1

## 2022-04-12 MED ORDER — AMIODARONE HCL 200 MG PO TABS
200.0000 mg | ORAL_TABLET | Freq: Once | ORAL | Status: AC
  Administered 2022-04-12: 200 mg via ORAL
  Filled 2022-04-12: qty 1

## 2022-04-12 MED ORDER — AMIODARONE HCL 200 MG PO TABS
400.0000 mg | ORAL_TABLET | Freq: Two times a day (BID) | ORAL | Status: DC
  Administered 2022-04-12 – 2022-04-14 (×4): 400 mg via ORAL
  Filled 2022-04-12 (×4): qty 2

## 2022-04-12 NOTE — Progress Notes (Signed)
Heart Failure Stewardship Pharmacist Progress Note   PCP: Glenis Smoker, MD PCP-Cardiologist: Donato Heinz, MD    HPI:  81 yo F with PMH of afib, HTN, hypothyroidism, GERD, and depression. She was admitted on 9/14 with anterior STEMI s/p DES to mid LAD. She was also noted to have mid RCA stenosis with recommended PCI once recovered from anterior infarct. ECHO on 9/14 with LVEF 35-40%, G1DD, severe akinesis of LV and anterior wall, RV mildly reduced. Hospitalization complicated by afib with tachy-brady syndrome and post-conversion pauses. She went back to the cath lab on 9/18 for staged RCA stent.   Current HF Medications: SGLT2i: Jardiance 10 mg daily  Prior to admission HF Medications: Beta blocker: carvedilol 6.25 mg BID ACE/ARB/ARNI: irbesartan 150 mg daily  Pertinent Lab Values: Serum creatinine 0.89, BUN 15, Potassium 4.1, Sodium 132, BNP 126.5, A1c 5.6   Vital Signs: Weight: 167 lbs (admission weight not collected) Blood pressure: 120/70s  Heart rate: 70-90s  I/O: -1L yesterday; net even  Medication Assistance / Insurance Benefits Check: Does the patient have prescription insurance?  Yes Type of insurance plan: [employer] Medicare  Does the patient qualify for medication assistance through manufacturers or grants?   Pending Eligible grants and/or patient assistance programs: pending Medication assistance applications in progress: none  Medication assistance applications approved: none Approved medication assistance renewals will be completed by: pending  Outpatient Pharmacy:  Prior to admission outpatient pharmacy: [facility] Is the patient willing to use [place] pharmacy at discharge? Yes Is the patient willing to transition their outpatient pharmacy to utilize a [facility] outpatient pharmacy?   Pending    Assessment: 1. Acute systolic CHF (LVEF 09-81%), due to ICM. NYHA class II symptoms. - Euvolemic on exam, no indication for diuretics. Strict I/Os  and daily weights. Keep K>4 and Mag>2. - Was placed on metoprolol but this was stopped with symptomatic pauses on 9/15. Consider restarting carvedilol 6.25 mg BID post cath if rhythm remains stable. - Consider starting low dose ARB + spironolactone today with stable renal function - Continue Jardiance 10 mg daily today   Plan: 1) Medication changes recommended at this time: - Start losartan 12.5 mg + spironolactone 12.5 mg daily  2) Patient assistance: - Jardiance copay $40 - Entresto copay $40  3)  Education  - To be completed prior to discharge  Kerby Nora, PharmD, BCPS Heart Failure Cytogeneticist Phone [phone]

## 2022-04-12 NOTE — Progress Notes (Signed)
CCMD called this RN to inform that patient converted back to atrial fibrillation. Paged Cardio NP and already in room to discuss with patient. New orders will be placed per NP. Will continue to monitor.  [place] C Jamair Cato

## 2022-04-12 NOTE — Progress Notes (Signed)
Mobility Specialist Progress Note:   04/12/22 0908  Mobility  Activity Ambulated with assistance in hallway  Level of Assistance Contact guard assist, steadying assist  Assistive Device None  Distance Ambulated (ft) 230 ft  Activity Response Tolerated well  $Mobility charge 1 Mobility   Pt received in bed willing to participate in mobility. No complaints of pain. Required 1 short standing break. Left in bed with call bell in reach and all needs met.   Pre- Mobility: 112 HR During Mobility: 135 HR Post Mobility:  [place] Air traffic controller only

## 2022-04-12 NOTE — Progress Notes (Addendum)
Rounding Note    Patient Name: Connie [name] Date of Encounter: 04/12/2022  [place] Cardiologist: Donato Heinz, MD   Subjective   Feeling well this morning. Ambulated in the hallways. Unfortunately back into afib RVR this morning after walking, though she does not seem symptomatic.    Inpatient Medications    Scheduled Meds:  amiodarone  200 mg Oral BID   apixaban  5 mg Oral BID   aspirin EC  81 mg Oral Daily   atorvastatin  80 mg Oral Daily   Chlorhexidine Gluconate Cloth  6 each Topical Daily   clopidogrel  75 mg Oral Daily   empagliflozin  10 mg Oral Daily   levothyroxine  75 mcg Oral QAC breakfast   losartan  25 mg Oral Daily   sertraline  100 mg Oral Daily   sodium chloride flush  3 mL Intravenous Q12H   Continuous Infusions:  sodium chloride Stopped (04/08/22 0825)   sodium chloride     PRN Meds: sodium chloride, sodium chloride, acetaminophen, morphine injection, nitroGLYCERIN, ondansetron (ZOFRAN) IV, mouth rinse, oxyCODONE, sodium chloride flush, sodium chloride flush   Vital Signs    Vitals:   04/12/22 0400 04/12/22 0404 04/12/22 0727 04/12/22 0839  BP:   (!) 128/47 (!) 143/70  Pulse:   91   Resp: 20 20 16 20   Temp:   98.3 F (36.8 C)   TempSrc:   Oral   SpO2:   96%   Weight:  75.9 kg      Intake/Output Summary (Last 24 hours) at 04/12/2022 0931 Last data filed at 04/12/2022 0840 Gross per 24 hour  Intake 1215.57 ml  Output 1101 ml  Net 114.57 ml      04/12/2022    4:04 AM 04/11/2022    3:30 AM 03/30/2022    2:55 PM  Last 3 Weights  Weight (lbs) 167 lb 5.3 oz 165 lb 9.1 oz 163 lb 6.4 oz  Weight (kg) 75.9 kg 75.1 kg 74.118 kg      Telemetry    Atrial fibrillation RVR 110s-140s - Personally Reviewed  ECG    Sinus Rhythm 90bpm, RBBB improved ST elevations in septal leads - Personally Reviewed  Physical Exam   GEN: No acute distress.   Neck: No JVD Cardiac: Irreg Irreg, no murmurs, rubs, or gallops.   Respiratory: Clear to auscultation bilaterally. GI: Soft, nontender, non-distended  MS: No edema; No deformity. Bilateral forearm bruising, but stable radial sites Neuro:  Nonfocal  Psych: Normal affect   Labs    High Sensitivity Troponin:   Recent Labs  Lab 04/07/22 1044 04/07/22 1306  TROPONINIHS 895* >24,000*     Chemistry Recent Labs  Lab 04/07/22 1044 04/08/22 0646 04/09/22 0103 04/10/22 0752 04/11/22 0316 04/12/22 0425  NA 134*   < > 130* 127* 132* 132*  K 3.6   < > 4.6 3.3* 4.9 4.1  CL 105   < > 97* 95* 101 104  CO2 22   < > 22 20* 23 22  GLUCOSE 143*   < > 153* 157* 107* 95  BUN 15   < > 23 22 21 15   CREATININE 0.73   < > 1.18* 1.13* 0.98 0.89  CALCIUM 8.8*   < > 8.9 8.3* 8.6* 8.5*  MG  --   --  1.7  --   --   --   PROT 5.8*  --   --   --   --   --  ALBUMIN 3.5  --   --   --   --   --   AST 25  --   --   --   --   --   ALT 16  --   --   --   --   --   ALKPHOS 99  --   --   --   --   --   BILITOT 0.8  --   --   --   --   --   GFRNONAA >60   < > 46* 48* 57* >60  ANIONGAP 7   < > 11 12 8 6    < > = values in this interval not displayed.    Lipids  Recent Labs  Lab 04/07/22 1044  CHOL 125  TRIG 103  HDL 50  LDLCALC 54  CHOLHDL 2.5    Hematology Recent Labs  Lab 04/10/22 0752 04/11/22 0316 04/12/22 0425  WBC 15.7* 13.4* 12.5*  RBC 3.64* 3.40* 3.33*  HGB 10.2* 9.8* 9.3*  HCT 31.1* 28.2* 28.5*  MCV 85.4 82.9 85.6  MCH 28.0 28.8 27.9  MCHC 32.8 34.8 32.6  RDW 13.6 13.7 13.9  PLT 296 264 292   Thyroid  Recent Labs  Lab 04/08/22 0646  TSH 0.773    BNP Recent Labs  Lab 04/07/22 1044  BNP 126.5*    DDimer No results for input(s): "DDIMER" in the last 168 hours.   Radiology    CARDIAC CATHETERIZATION  Result Date: 04/11/2022   Mid RCA lesion is 90% stenosed.   Post intervention, there is a 0% residual stenosis. 1.  Successful PTCA and stenting of severe tandem lesions in the RCA using a 3.5 x 38 mm drug-eluting stent 2.  Continued  patency of the LAD stent with TIMI-3 flow into the apical LAD Recommendations: DAPT with clopidogrel x12 months.  Patient has been diagnosed with paroxysmal atrial fibrillation.  We will start apixaban at 2200 today.  Continue aspirin 81 mg x 30 days.    Cardiac Studies   Cath: 04/07/22     Prox LAD to Mid LAD lesion is 100% stenosed.   Prox RCA lesion is 60% stenosed.   Mid RCA lesion is 90% stenosed.   A drug-eluting stent was successfully placed using a SYNERGY XD 2.75X20.   Post intervention, there is a 0% residual stenosis.   LV end diastolic pressure is severely elevated.   1.  Acute anterior STEMI secondary to thrombotic occlusion of the mid LAD just after the first diagonal, treated with PCI using a 2.75 x 20 mm Synergy DES.  Procedure complicated by no reflow phenomenon likely due to late STEMI presentation with TIMI II flow at the apical portion of the LAD at the completion of the procedure 2.  Severe mid RCA stenosis, recommend staged PCI once she recovers from her anterior infarct 3.  Patent left main and left circumflex with mild nonobstructive plaquing 4.  Severely elevated LVEDP of 28 mmHg consistent with acute systolic heart failure secondary to acute myocardial infarction   Recommendations: ICU care, post MI medical therapy, patient loaded with ticagrelor 180 mg at the completion of the procedure, continue cangrelor x2 hours, consider staged PCI of the RCA prior to discharge pending her clinical course.  Will check 2D echo to assess degree of LV dysfunction.    Diagnostic Dominance: Right  Intervention    Echo: 04/07/22   IMPRESSIONS     1. No apical thrombus with Definity contrast. Left  ventricular ejection  fraction, by estimation, is 35 to 40%. The left ventricle has moderately  decreased function. The left ventricle demonstrates regional wall motion  abnormalities (see scoring  diagram/findings for description). Left ventricular diastolic parameters  are  consistent with Grade I diastolic dysfunction (impaired relaxation).  There is severe akinesis of the left ventricular, entire anterior wall,  anteroseptal wall, apical segment  and inferoapical segment. 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 The basal inferior and lateral walls are hyperdynamic.   2. Right ventricular systolic function is mildly reduced. The right  ventricular size is normal. Tricuspid regurgitation signal is inadequate  for assessing PA pressure.   3. The mitral valve is abnormal. Trivial mitral valve regurgitation.   4. The aortic valve is tricuspid. Aortic valve regurgitation is not  visualized.   5. The inferior vena cava is normal in size with greater than 50%  respiratory variability, suggesting right atrial pressure of 3 mmHg.   Comparison(s): Changes from prior study are noted. 04/07/2021: LVEF 55-60%,  normal wall motion.   FINDINGS   Left Ventricle: No apical thrombus with Definity contrast. Left  ventricular ejection fraction, by estimation, is 35 to 40%. The left  ventricle has moderately decreased function. The left ventricle  demonstrates regional wall motion abnormalities. Severe  akinesis of the left ventricular, entire anterior wall, anteroseptal wall,  apical segment and inferoapical segment. Definity contrast agent was given  IV to delineate the left ventricular endocardial borders. The left  ventricular internal cavity size was  normal in size. There is no left ventricular hypertrophy. Left ventricular  diastolic parameters are consistent with Grade I diastolic dysfunction  (impaired relaxation). Indeterminate filling pressures.   Right Ventricle: The right ventricular size is normal. No increase in  right ventricular wall thickness. Right ventricular systolic function is  mildly reduced. Tricuspid regurgitation signal is inadequate for assessing  PA pressure.   Left Atrium: Left atrial size was normal in size.   Right  Atrium: Right atrial size was normal in size.   Pericardium: There is no evidence of pericardial effusion.   Mitral Valve: The mitral valve is abnormal. There is mild calcification of  the posterior mitral valve leaflet(s). Mild mitral annular calcification.  Trivial mitral valve regurgitation.   Tricuspid Valve: The tricuspid valve is grossly normal. Tricuspid valve  regurgitation is trivial.   Aortic Valve: The aortic valve is tricuspid. Aortic valve regurgitation is  not visualized.   Pulmonic Valve: The pulmonic valve was normal in structure. Pulmonic valve  regurgitation is not visualized.   Aorta: The aortic root and ascending aorta are structurally normal, with  no evidence of dilitation.   Venous: The inferior vena cava is normal in size with greater than 50%  respiratory variability, suggesting right atrial pressure of 3 mmHg.   IAS/Shunts: There is right bowing of the interatrial septum, suggestive of  elevated left atrial pressure. No atrial level shunt detected by color  flow Doppler.   Cath: 04/11/22    Mid RCA lesion is 90% stenosed.   Post intervention, there is a 0% residual stenosis.   1.  Successful PTCA and stenting of severe tandem lesions in the RCA using a 3.5 x 38 mm drug-eluting stent 2.  Continued patency of the LAD stent with TIMI-3 flow into the apical LAD   Recommendations: DAPT with clopidogrel x12 months.  Patient has been diagnosed with paroxysmal atrial fibrillation.  We will start apixaban at 2200 today.  Continue aspirin 81 mg x  30 days.  Diagnostic Dominance: Right  Intervention    Patient Profile     81 y.o. female with history of paroxysmal atrial flutter not on anti-coagulation, GERD, HTN and hypothyroidism admitted 04/07/22 with an anterior STEMI. Cardiac cath 04/07/22 with thrombotic occlusion of the mid LAD. Also with severe mid RCA stenosis. LVEDP 28 mmHg.   Assessment & Plan    Anterior STEMI -- Underwent cardiac  catheterization noted above with thrombotic occlusion of mid LAD just after first diagonal treated with PCI/DES x1.  The procedure was complicated by no reflow phenomenon with TIMI II flow at apical portion of LAD at the completion of procedure.  Placed on DAPT with aspirin/Brilinta.  Developed paroxysmal atrial fibrillation and switched from Brilinta to Plavix. Also with severe mid RCA stenosis and underwent staged PCI/DESx1 to [facility] on 9/18.   -- Continue aspirin, Plavix, and Eliquis for one month (then stop ASA) statin    ICM HFrEF -- Echo showed LVEF of 35 to [id number], grade 1 diastolic dysfunction, severe akinesis of the LV, anterior wall, septal and apical segments, mildly reduced RV. -- initially placed on metoprolol but stopped with post conversion pauses.  -- continue jardiance, add losartan -- will defer addition of BB therapy for now as she is back in afib and developed post conversions pauses with prior episodes   HLD -- LP(a) 36, LDL 54, HDL 50 -- Continue atorvastatin 40 mg daily   HTN -- Stable -- adding losartan for CHF   Paroxysmal atrial fib RVR Post conversion pauses -- Does have history of paroxysmal atrial flutter.  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 TSH WNL. Did have postconversion pauses.  Started on IV amiodarone.  Seen by EP, hopeful to avoid pacemaker implant due to bleeding risk with DAPT.  CHA2DS2-VASc score of at least 4.  -- transitioned to amiodarone 200mg  BID yesterday along with Eliquis -- back in afib RVR last evening with conversion, and then again this morning. Will increase amiodarone to 400mg  BID and follow  Hypothyroidism -- TSH WNL -- continue levothyroxine  Up walking with mobility specialists today. Encouraged to continue ambulation.   For questions or updates, please contact [place] Please consult [email or web address] for contact info under        Signed, Reino Bellis, NP  04/12/2022, 9:31 AM      Patient seen and examined. Agree with assessment and plan.  No recurrent chest pain.  Patient is a 5 status post anterior STEMI secondary to thrombotic occlusion of the mid LAD treated with PCI.  The procedure was complicated by no reflow phenomena TIMI II flow in the apical portion of the LAD at the completion of the procedure.  At repeat catheterization yesterday, she underwent successful stenting of tandem RCA stenoses.  There was now TIMI-3 flow down the LAD.  She has a history of PE a flutter.  She developed new onset atrial fibrillation tacky bradycardia syndrome and had initial postconversion pauses.  She was started on IV amiodarone.  IV amiodarone was discontinued yesterday and she was transitioned to oral amiodarone.  On telemetry today she is back in atrial fibrillation with ventricular rate at approximately 105 to 110 bpm.  We will increase amiodarone dose to 400 mg twice a day.  At present we will not initiate beta-blocker therapy due to sinus node dysfunction and previously documented post cardioversion significant pauses.  No change in previous large area of ecchymosis from her initial right radial cath site from April 07, 2022.  There is mild ecchymosis at left radial cath site from April 11, 2022 procedure.  There is no JVD.  Lungs are clear.  Rhythm is irregularly irregular with ventricular rate at approximately 105.  Abdomen is soft nontender.  There is no edema.  We will plan to repeat ECG in a.m. with follow-up laboratory.   Troy Sine, MD, [facility] 04/12/2022 10:42 AM

## 2022-04-13 ENCOUNTER — Inpatient Hospital Stay: Payer: Medicare PPO

## 2022-04-13 DIAGNOSIS — I4892 Unspecified atrial flutter: Secondary | ICD-10-CM

## 2022-04-13 DIAGNOSIS — I480 Paroxysmal atrial fibrillation: Secondary | ICD-10-CM | POA: Diagnosis not present

## 2022-04-13 DIAGNOSIS — I2510 Atherosclerotic heart disease of native coronary artery without angina pectoris: Secondary | ICD-10-CM

## 2022-04-13 DIAGNOSIS — Z7901 Long term (current) use of anticoagulants: Secondary | ICD-10-CM | POA: Diagnosis not present

## 2022-04-13 DIAGNOSIS — I2102 ST elevation (STEMI) myocardial infarction involving left anterior descending coronary artery: Secondary | ICD-10-CM | POA: Diagnosis not present

## 2022-04-13 DIAGNOSIS — I10 Essential (primary) hypertension: Secondary | ICD-10-CM

## 2022-04-13 DIAGNOSIS — I255 Ischemic cardiomyopathy: Secondary | ICD-10-CM | POA: Diagnosis not present

## 2022-04-13 LAB — COMPREHENSIVE METABOLIC PANEL
ALT: 20 U/L (ref 0–44)
AST: 28 U/L (ref 15–41)
Albumin: 2.6 g/dL — ABNORMAL LOW (ref 3.5–5.0)
Alkaline Phosphatase: 67 U/L (ref 38–126)
Anion gap: 11 (ref 5–15)
BUN: 15 mg/dL (ref 8–23)
CO2: 18 mmol/L — ABNORMAL LOW (ref 22–32)
Calcium: 8.8 mg/dL — ABNORMAL LOW (ref 8.9–10.3)
Chloride: 105 mmol/L (ref 98–111)
Creatinine, Ser: 1.1 mg/dL — ABNORMAL HIGH (ref 0.44–1.00)
GFR, Estimated: 50 mL/min — ABNORMAL LOW (ref >60)
Glucose, Bld: 98 mg/dL (ref 70–99)
Potassium: 4.6 mmol/L (ref 3.5–5.1)
Sodium: 134 mmol/L — ABNORMAL LOW (ref 135–145)
Total Bilirubin: 1.2 mg/dL (ref 0.3–1.2)
Total Protein: 5.3 g/dL — ABNORMAL LOW (ref 6.5–8.1)

## 2022-04-13 LAB — CBC
HCT: 28.7 % — ABNORMAL LOW (ref 36.0–46.0)
Hemoglobin: 9.4 g/dL — ABNORMAL LOW (ref 12.0–15.0)
MCH: 28.1 pg (ref 26.0–34.0)
MCHC: 32.8 g/dL (ref 30.0–36.0)
MCV: 85.9 fL (ref 80.0–100.0)
Platelets: 236 10*3/uL (ref 150–400)
RBC: 3.34 MIL/uL — ABNORMAL LOW (ref 3.87–5.11)
RDW: 14 % (ref 11.5–15.5)
WBC: 12.6 10*3/uL — ABNORMAL HIGH (ref 4.0–10.5)
nRBC: 0 % (ref 0.0–0.2)

## 2022-04-13 LAB — MAGNESIUM: Magnesium: 2 mg/dL (ref 1.7–2.4)

## 2022-04-13 MED ORDER — CARVEDILOL 3.125 MG PO TABS
3.1250 mg | ORAL_TABLET | Freq: Two times a day (BID) | ORAL | Status: DC
  Administered 2022-04-13 – 2022-04-14 (×2): 3.125 mg via ORAL
  Filled 2022-04-13 (×2): qty 1

## 2022-04-13 NOTE — Progress Notes (Signed)
Heart Failure Stewardship Pharmacist Progress Note   PCP: Glenis Smoker, MD PCP-Cardiologist: Donato Heinz, MD    HPI:  81 yo F with PMH of afib, HTN, hypothyroidism, GERD, and depression. She was admitted on 9/14 with anterior STEMI s/p DES to mid LAD. She was also noted to have mid RCA stenosis with recommended PCI once recovered from anterior infarct. ECHO on 9/14 with LVEF 35-40%, G1DD, severe akinesis of LV and anterior wall, RV mildly reduced. Hospitalization complicated by afib with tachy-brady syndrome and post-conversion pauses. She went back to the cath lab on 9/18 for staged RCA stent.   Current HF Medications: ACE/ARB/ARNI: losartan 25 mg daily SGLT2i: Jardiance 10 mg daily  Prior to admission HF Medications: Beta blocker: carvedilol 6.25 mg BID ACE/ARB/ARNI: irbesartan 150 mg daily  Pertinent Lab Values: Serum creatinine 1.10, BUN 15, Potassium 4.6, Sodium 134, Magnesium 2.0, BNP 126.5, A1c 5.6   Vital Signs: Weight: 167 lbs (admission weight not collected) Blood pressure: 110-130/70s  Heart rate: 80-120s - afib I/O: -436mL yesterday; net even  Medication Assistance / Insurance Benefits Check: Does the patient have prescription insurance?  Yes Type of insurance plan: [employer]  Does the patient qualify for medication assistance through manufacturers or grants?   Pending Eligible grants and/or patient assistance programs: pending Medication assistance applications in progress: none  Medication assistance applications approved: none Approved medication assistance renewals will be completed by: pending  Outpatient Pharmacy:  Prior to admission outpatient pharmacy: [facility] Is the patient willing to use [facility] pharmacy at discharge? Yes Is the patient willing to transition their outpatient pharmacy to utilize a [facility] outpatient pharmacy?   Pending    Assessment: 1. Acute systolic CHF (LVEF 29-51%), due to ICM. NYHA class II  symptoms. - Euvolemic on exam, no indication for diuretics. Strict I/Os and daily weights. Keep K>4 and Mag>2. - Was placed on metoprolol but this was stopped with symptomatic pauses on 9/15. Continues to have short post conversion pauses. - Agree with adding losartan 25 mg daily today - Consider adding spironolactone tomorrow if stable renal function - Continue Jardiance 10 mg daily today   Plan: 1) Medication changes recommended at this time: - Agree with changes  2) Patient assistance: - Jardiance copay $40 - Entresto copay $40  3)  Education  - Patient has been educated on current HF medications and potential additions to HF medication regimen - Patient verbalizes understanding that over the next few months, these medication doses may change and more medications may be added to optimize HF regimen - Patient has been educated on basic disease state pathophysiology and goals of therapy   Kerby Nora, PharmD, BCPS Heart Failure Stewardship Pharmacist Phone [phone]

## 2022-04-13 NOTE — Progress Notes (Signed)
CARDIAC REHAB PHASE I   Pt resting in bed. She is feeling tired this afternoon and does not want to walk right now. Encouraged pt to get in 2 more walks today. As this may help promote increased energy. Pt home education previously provided reviewed. Pt questions and concerns addressed. Will continue to follow.     1330-1400 Vanessa Barbara, RN BSN 04/13/2022 1:58 PM

## 2022-04-13 NOTE — Progress Notes (Addendum)
Rounding Note    Patient Name: Connie [name] Date of Encounter: 04/13/2022  [place] Cardiologist: Donato Heinz, MD   Subjective   No complaints this morning. Ambulated in the hallway, was sort of breath. No chest pain.   Inpatient Medications    Scheduled Meds:  amiodarone  400 mg Oral BID   apixaban  5 mg Oral BID   aspirin EC  81 mg Oral Daily   atorvastatin  80 mg Oral Daily   Chlorhexidine Gluconate Cloth  6 each Topical Daily   clopidogrel  75 mg Oral Daily   empagliflozin  10 mg Oral Daily   levothyroxine  75 mcg Oral QAC breakfast   losartan  25 mg Oral Daily   sertraline  100 mg Oral Daily   sodium chloride flush  3 mL Intravenous Q12H   Continuous Infusions:  sodium chloride Stopped (04/08/22 0825)   sodium chloride     PRN Meds: sodium chloride, sodium chloride, acetaminophen, morphine injection, nitroGLYCERIN, ondansetron (ZOFRAN) IV, mouth rinse, oxyCODONE, sodium chloride flush, sodium chloride flush   Vital Signs    Vitals:   04/12/22 1958 04/13/22 0026 04/13/22 0359 04/13/22 0916  BP: 124/61 121/68 119/67 139/72  Pulse: 87 94 89   Resp: 18 20 15    Temp: 98.8 F (37.1 C) 98.4 F (36.9 C) 98.2 F (36.8 C) 98.3 F (36.8 C)  TempSrc: Oral Oral Oral Oral  SpO2: 97% 100% 98% 98%  Weight:        Intake/Output Summary (Last 24 hours) at 04/13/2022 1001 Last data filed at 04/12/2022 1300 Gross per 24 hour  Intake 240 ml  Output --  Net 240 ml      04/12/2022    4:04 AM 04/11/2022    3:30 AM 03/30/2022    2:55 PM  Last 3 Weights  Weight (lbs) 167 lb 5.3 oz 165 lb 9.1 oz 163 lb 6.4 oz  Weight (kg) 75.9 kg 75.1 kg 74.118 kg      Telemetry    Afib-->sinus with few post conversion pauses-->afib RVR (rates in the 140s with ambulation) - Personally Reviewed  ECG    No new tracing this morning  Physical Exam   GEN: Very pleasant older female, No acute distress.   Neck: No JVD Cardiac: Irreg Irreg, no murmurs,  rubs, or gallops.  Respiratory: Clear to auscultation bilaterally. GI: Soft, nontender, non-distended  MS: No edema; No deformity. Various bruising to bilateral forearms at cath sites. Neuro:  Nonfocal  Psych: Normal affect   Labs    High Sensitivity Troponin:   Recent Labs  Lab 04/07/22 1044 04/07/22 1306  TROPONINIHS 895* >24,000*     Chemistry Recent Labs  Lab 04/07/22 1044 04/08/22 0646 04/09/22 0103 04/10/22 0752 04/11/22 0316 04/12/22 0425 04/13/22 0330  NA 134*   < > 130*   < > 132* 132* 134*  K 3.6   < > 4.6   < > 4.9 4.1 4.6  CL 105   < > 97*   < > 101 104 105  CO2 22   < > 22   < > 23 22 18*  GLUCOSE 143*   < > 153*   < > 107* 95 98  BUN 15   < > 23   < > 21 15 15   CREATININE 0.73   < > 1.18*   < > 0.98 0.89 1.10*  CALCIUM 8.8*   < > 8.9   < > 8.6* 8.5* 8.8*  MG  --   --  1.7  --   --   --  2.0  PROT 5.8*  --   --   --   --   --  5.3*  ALBUMIN 3.5  --   --   --   --   --  2.6*  AST 25  --   --   --   --   --  28  ALT 16  --   --   --   --   --  20  ALKPHOS 99  --   --   --   --   --  67  BILITOT 0.8  --   --   --   --   --  1.2  GFRNONAA >60   < > 46*   < > 57* >60 50*  ANIONGAP 7   < > 11   < > 8 6 11    < > = values in this interval not displayed.    Lipids  Recent Labs  Lab 04/07/22 1044  CHOL 125  TRIG 103  HDL 50  LDLCALC 54  CHOLHDL 2.5    Hematology Recent Labs  Lab 04/11/22 0316 04/12/22 0425 04/13/22 0330  WBC 13.4* 12.5* 12.6*  RBC 3.40* 3.33* 3.34*  HGB 9.8* 9.3* 9.4*  HCT 28.2* 28.5* 28.7*  MCV 82.9 85.6 85.9  MCH 28.8 27.9 28.1  MCHC 34.8 32.6 32.8  RDW 13.7 13.9 14.0  PLT 264 292 236   Thyroid  Recent Labs  Lab 04/08/22 0646  TSH 0.773    BNP Recent Labs  Lab 04/07/22 1044  BNP 126.5*    DDimer No results for input(s): "DDIMER" in the last 168 hours.   Radiology    CARDIAC CATHETERIZATION  Result Date: 04/11/2022   Mid RCA lesion is 90% stenosed.   Post intervention, there is a 0% residual stenosis. 1.   Successful PTCA and stenting of severe tandem lesions in the RCA using a 3.5 x 38 mm drug-eluting stent 2.  Continued patency of the LAD stent with TIMI-3 flow into the apical LAD Recommendations: DAPT with clopidogrel x12 months.  Patient has been diagnosed with paroxysmal atrial fibrillation.  We will start apixaban at 2200 today.  Continue aspirin 81 mg x 30 days.    Cardiac Studies   Cath: 04/07/22     Prox LAD to Mid LAD lesion is 100% stenosed.   Prox RCA lesion is 60% stenosed.   Mid RCA lesion is 90% stenosed.   A drug-eluting stent was successfully placed using a SYNERGY XD 2.75X20.   Post intervention, there is a 0% residual stenosis.   LV end diastolic pressure is severely elevated.   1.  Acute anterior STEMI secondary to thrombotic occlusion of the mid LAD just after the first diagonal, treated with PCI using a 2.75 x 20 mm Synergy DES.  Procedure complicated by no reflow phenomenon likely due to late STEMI presentation with TIMI II flow at the apical portion of the LAD at the completion of the procedure 2.  Severe mid RCA stenosis, recommend staged PCI once she recovers from her anterior infarct 3.  Patent left main and left circumflex with mild nonobstructive plaquing 4.  Severely elevated LVEDP of 28 mmHg consistent with acute systolic heart failure secondary to acute myocardial infarction   Recommendations: ICU care, post MI medical therapy, patient loaded with ticagrelor 180 mg at the completion of the procedure, continue cangrelor x2 hours, consider  staged PCI of the RCA prior to discharge pending her clinical course.  Will check 2D echo to assess degree of LV dysfunction.    Diagnostic Dominance: Right  Intervention    Echo: 04/07/22   IMPRESSIONS     1. No apical thrombus with Definity contrast. Left ventricular ejection  fraction, by estimation, is 35 to 40%. The left ventricle has moderately  decreased function. The left ventricle demonstrates regional wall motion   abnormalities (see scoring  diagram/findings for description). Left ventricular diastolic parameters  are consistent with Grade I diastolic dysfunction (impaired relaxation).  There is severe akinesis of the left ventricular, entire anterior wall,  anteroseptal wall, apical segment  and inferoapical segment. 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 The basal inferior and lateral walls are hyperdynamic.   2. Right ventricular systolic function is mildly reduced. The right  ventricular size is normal. Tricuspid regurgitation signal is inadequate  for assessing PA pressure.   3. The mitral valve is abnormal. Trivial mitral valve regurgitation.   4. The aortic valve is tricuspid. Aortic valve regurgitation is not  visualized.   5. The inferior vena cava is normal in size with greater than 50%  respiratory variability, suggesting right atrial pressure of 3 mmHg.   Comparison(s): Changes from prior study are noted. 04/07/2021: LVEF 55-60%,  normal wall motion.   FINDINGS   Left Ventricle: No apical thrombus with Definity contrast. Left  ventricular ejection fraction, by estimation, is 35 to 40%. The left  ventricle has moderately decreased function. The left ventricle  demonstrates regional wall motion abnormalities. Severe  akinesis of the left ventricular, entire anterior wall, anteroseptal wall,  apical segment and inferoapical segment. Definity contrast agent was given  IV to delineate the left ventricular endocardial borders. The left  ventricular internal cavity size was  normal in size. There is no left ventricular hypertrophy. Left ventricular  diastolic parameters are consistent with Grade I diastolic dysfunction  (impaired relaxation). Indeterminate filling pressures.   Right Ventricle: The right ventricular size is normal. No increase in  right ventricular wall thickness. Right ventricular systolic function is  mildly reduced. Tricuspid regurgitation signal  is inadequate for assessing  PA pressure.   Left Atrium: Left atrial size was normal in size.   Right Atrium: Right atrial size was normal in size.   Pericardium: There is no evidence of pericardial effusion.   Mitral Valve: The mitral valve is abnormal. There is mild calcification of  the posterior mitral valve leaflet(s). Mild mitral annular calcification.  Trivial mitral valve regurgitation.   Tricuspid Valve: The tricuspid valve is grossly normal. Tricuspid valve  regurgitation is trivial.   Aortic Valve: The aortic valve is tricuspid. Aortic valve regurgitation is  not visualized.   Pulmonic Valve: The pulmonic valve was normal in structure. Pulmonic valve  regurgitation is not visualized.   Aorta: The aortic root and ascending aorta are structurally normal, with  no evidence of dilitation.   Venous: The inferior vena cava is normal in size with greater than 50%  respiratory variability, suggesting right atrial pressure of 3 mmHg.   IAS/Shunts: There is right bowing of the interatrial septum, suggestive of  elevated left atrial pressure. No atrial level shunt detected by color  flow Doppler.    Cath: 04/11/22     Mid RCA lesion is 90% stenosed.   Post intervention, there is a 0% residual stenosis.   1.  Successful PTCA and stenting of severe tandem lesions in the RCA using  a 3.5 x 38 mm drug-eluting stent 2.  Continued patency of the LAD stent with TIMI-3 flow into the apical LAD   Recommendations: DAPT with clopidogrel x12 months.  Patient has been diagnosed with paroxysmal atrial fibrillation.  We will start apixaban at 2200 today.  Continue aspirin 81 mg x 30 days.   Diagnostic Dominance: Right  Intervention     Patient Profile     80 y.o. female  with history of paroxysmal atrial flutter not on anti-coagulation, GERD, HTN and hypothyroidism admitted 04/07/22 with an anterior STEMI. Cardiac cath 04/07/22 with thrombotic occlusion of the mid LAD. Also with  severe mid RCA stenosis. LVEDP 28 mmHg.   Assessment & Plan    Anterior STEMI -- Underwent cardiac catheterization noted above with thrombotic occlusion of mid LAD just after first diagonal treated with PCI/DES x1.  The procedure was complicated by no reflow phenomenon with TIMI II flow at apical portion of LAD at the completion of procedure.  Placed on DAPT with aspirin/Brilinta.  Developed paroxysmal atrial fibrillation and switched from Brilinta to Plavix. Also with severe mid RCA stenosis and underwent staged PCI/DESx1 to [facility] on 9/18.   -- Continue aspirin, Plavix, and Eliquis for one month (then stop ASA) statin    ICM HFrEF -- Echo showed LVEF of 35 to [id number], grade 1 diastolic dysfunction, severe akinesis of the LV, anterior wall, septal and apical segments, mildly reduced RV. -- initially placed on metoprolol after initial cath but stopped with post conversion pauses.  -- continue jardiance, losartan   HLD -- LP(a) 36, LDL 54, HDL 50 -- Continue atorvastatin 40 mg daily   HTN -- Stable -- adding losartan for CHF   Paroxysmal atrial fib RVR Post conversion pauses -- Does have history of paroxysmal atrial flutter.  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 TSH WNL. Did have postconversion pauses.  Started on IV amiodarone.  Seen by EP, hopeful to avoid pacemaker implant due to bleeding risk with DAPT.  CHA2DS2-VASc score of at least 4.  -- transitioned to amiodarone 200mg  BID 9/18 along with Eliquis -- back in afib RVR the evening of 9/18, continues to be in/out of afib RVR. Increased amiodarone to 400mg  BID 9/19. Continues with have short post conversion pauses. Will touch base with EP for assistance   Hypothyroidism -- TSH WNL -- continue levothyroxine    For questions or updates, please contact [place] Please consult [email or web address] for contact info under        Signed, Reino Bellis, NP  04/13/2022, 10:01 AM      Patient  seen and examined. Agree with assessment and plan.  Patient developed recurrent atrial fibrillation again this morning despite being on amiodarone 40 mg twice a day.  However since then, she again has converted back to sinus rhythm and currently is in sinus rhythm with heart rate in the 90s.  With her ischemic cardiomyopathy with EF 35 to 40%, status post embolic occlusion of her LAD requiring PTCA with subsequent staged intervention to RCA, will add very low-dose carvedilol at 3.125 mg twice a day we will need to monitor.  Hopefully will continue loading her amiodarone at 400 mg twice a day for the next several days with ultimate reduction of dose to 200 twice daily.  She is not having any recurrent anginal symptoms.  There is no significant shortness of breath.  Plan 1 month of triple drug therapy with aspirin/Plavix and Eliquis with discontinuance of aspirin after 30 days.  LDL is excellent at 54 on atorvastatin 80 mg.    Troy Sine, MD, [facility] 04/13/2022 10:52 AM

## 2022-04-13 NOTE — Progress Notes (Signed)
Mobility Specialist Criteria Algorithm Info.   04/13/22 0910  Mobility  Activity Ambulated with assistance in hallway  Range of Motion/Exercises Active;All extremities  Level of Assistance Standby assist, set-up cues, supervision of patient - no hands on  Assistive Device None  Distance Ambulated (ft) [phone] ft  Activity Response Tolerated well   Pre Mobility:  HR 120 During Mobility: HR 160 Post Mobility: HR 139  Patient received in supine agreeable to participate in mobility. Ambulated supervision to min guard with slightly unsteady gait. Pt with x2 LOB requiring close min guard, was able to regain balance without assistance. HR elevated during ambulation peaking at 160bpm, needed x2 standing rest recovery breaks. Also was 2/4 dyspneic while ambulating that improved with rest. Returned to room without complaint or incident. Was left in supine with all needs met, call bell in reach.   [place] Reve Crocket, [place], [place]  QMKJI:[phone] Office: [phone]

## 2022-04-14 ENCOUNTER — Other Ambulatory Visit (HOSPITAL_COMMUNITY): Payer: Self-pay

## 2022-04-14 DIAGNOSIS — I255 Ischemic cardiomyopathy: Secondary | ICD-10-CM

## 2022-04-14 DIAGNOSIS — I2102 ST elevation (STEMI) myocardial infarction involving left anterior descending coronary artery: Secondary | ICD-10-CM | POA: Diagnosis not present

## 2022-04-14 DIAGNOSIS — I10 Essential (primary) hypertension: Secondary | ICD-10-CM

## 2022-04-14 DIAGNOSIS — E039 Hypothyroidism, unspecified: Secondary | ICD-10-CM

## 2022-04-14 DIAGNOSIS — I480 Paroxysmal atrial fibrillation: Secondary | ICD-10-CM

## 2022-04-14 DIAGNOSIS — E785 Hyperlipidemia, unspecified: Secondary | ICD-10-CM

## 2022-04-14 MED ORDER — LOSARTAN POTASSIUM 25 MG PO TABS
25.0000 mg | ORAL_TABLET | Freq: Every day | ORAL | 2 refills | Status: DC
  Filled 2022-04-14: qty 30, 30d supply, fill #0

## 2022-04-14 MED ORDER — NITROGLYCERIN 0.4 MG SL SUBL
0.4000 mg | SUBLINGUAL_TABLET | SUBLINGUAL | 2 refills | Status: DC | PRN
  Filled 2022-04-14: qty 25, 7d supply, fill #0

## 2022-04-14 MED ORDER — CARVEDILOL 3.125 MG PO TABS
3.1250 mg | ORAL_TABLET | Freq: Two times a day (BID) | ORAL | 1 refills | Status: DC
  Filled 2022-04-14: qty 90, 45d supply, fill #0

## 2022-04-14 MED ORDER — ASPIRIN 81 MG PO TBEC
81.0000 mg | DELAYED_RELEASE_TABLET | Freq: Every day | ORAL | 0 refills | Status: DC
  Filled 2022-04-14: qty 23, 23d supply, fill #0

## 2022-04-14 MED ORDER — APIXABAN 5 MG PO TABS
5.0000 mg | ORAL_TABLET | Freq: Two times a day (BID) | ORAL | 3 refills | Status: DC
  Filled 2022-04-14: qty 60, 30d supply, fill #0

## 2022-04-14 MED ORDER — EMPAGLIFLOZIN 10 MG PO TABS
10.0000 mg | ORAL_TABLET | Freq: Every day | ORAL | 2 refills | Status: DC
  Filled 2022-04-14: qty 30, 30d supply, fill #0

## 2022-04-14 MED ORDER — SPIRONOLACTONE 12.5 MG HALF TABLET
12.5000 mg | ORAL_TABLET | Freq: Every day | ORAL | Status: DC
  Administered 2022-04-14: 12.5 mg via ORAL
  Filled 2022-04-14: qty 1

## 2022-04-14 MED ORDER — CLOPIDOGREL BISULFATE 75 MG PO TABS
75.0000 mg | ORAL_TABLET | Freq: Every day | ORAL | 2 refills | Status: DC
  Filled 2022-04-14: qty 90, 90d supply, fill #0

## 2022-04-14 MED ORDER — SPIRONOLACTONE 25 MG PO TABS
12.5000 mg | ORAL_TABLET | Freq: Every day | ORAL | 1 refills | Status: DC
  Filled 2022-04-14: qty 30, 60d supply, fill #0

## 2022-04-14 MED ORDER — AMIODARONE HCL 200 MG PO TABS
ORAL_TABLET | ORAL | 0 refills | Status: DC
  Filled 2022-04-14: qty 90, 68d supply, fill #0

## 2022-04-14 NOTE — Progress Notes (Signed)
Heart Failure Stewardship Pharmacist Progress Note   PCP: Glenis Smoker, MD PCP-Cardiologist: Donato Heinz, MD    HPI:  81 yo F with PMH of afib, HTN, hypothyroidism, GERD, and depression. She was admitted on 9/14 with anterior STEMI s/p DES to mid LAD. She was also noted to have mid RCA stenosis with recommended PCI once recovered from anterior infarct. ECHO on 9/14 with LVEF 35-40%, G1DD, severe akinesis of LV and anterior wall, RV mildly reduced. Hospitalization complicated by afib with tachy-brady syndrome and post-conversion pauses. She went back to the cath lab on 9/18 for staged RCA stent.   Discharge HF Medications: Beta Blocker: carvedilol 3.125 mg BID ACE/ARB/ARNI: losartan 25 mg daily MRA: spironolactone 12.5 mg daily SGLT2i: Jardiance 10 mg daily  Prior to admission HF Medications: Beta blocker: carvedilol 6.25 mg BID ACE/ARB/ARNI: irbesartan 150 mg daily  Pertinent Lab Values: As of 9/20: Serum creatinine 1.10, BUN 15, Potassium 4.6, Sodium 134, Magnesium 2.0, BNP 126.5, A1c 5.6   Vital Signs: Weight: 167 lbs (admission weight not collected) Blood pressure: 110/60s  Heart rate: 70-80s  Medication Assistance / Insurance Benefits Check: Does the patient have prescription insurance?  Yes Type of insurance plan: [employer]  Does the patient qualify for medication assistance through manufacturers or grants?   Pending Eligible grants and/or patient assistance programs: pending Medication assistance applications in progress: none  Medication assistance applications approved: none Approved medication assistance renewals will be completed by: pending  Outpatient Pharmacy:  Prior to admission outpatient pharmacy: [facility] Is the patient willing to use [place] pharmacy at discharge? Yes Is the patient willing to transition their outpatient pharmacy to utilize a [facility] outpatient pharmacy?   Pending    Assessment: 1. Acute systolic CHF (LVEF  23-55%), due to ICM. NYHA class II symptoms. - Euvolemic on exam, no indication for diuretics. Strict I/Os and daily weights. Keep K>4 and Mag>2. - Contine carvedilol 3.125 mg BID - Continue losartan 25 mg daily today - Agree with adding spironolactone 12.5 mg daily. BMET in 1 week. - Continue Jardiance 10 mg daily today   Plan: 1) Medication changes recommended at this time: - Agree with changes; discharge today  2) Patient assistance: - Jardiance copay $40 - Entresto copay $40  3)  Education  - Patient has been educated on current HF medications and potential additions to HF medication regimen - Patient verbalizes understanding that over the next few months, these medication doses may change and more medications may be added to optimize HF regimen - Patient has been educated on basic disease state pathophysiology and goals of therapy   Kerby Nora, PharmD, BCPS Heart Failure Stewardship Pharmacist Phone [phone]

## 2022-04-14 NOTE — Discharge Summary (Addendum)
Discharge Summary    Patient ID: Connie [name] MRN: [id number]; DOB: 1940-11-15  Admit date: 04/07/2022 Discharge date: 04/14/2022  PCP:  Glenis Smoker, MD   [place] [facility] Cardiologist:  Donato Heinz, MD      Discharge Diagnoses    Principal Problem:   STEMI involving left anterior descending coronary artery [facility]) Active Problems:   ST elevation myocardial infarction involving left anterior descending (LAD) coronary artery (HCC)   PAF (paroxysmal atrial fibrillation) ([place])   Hyperlipidemia   Ischemic cardiomyopathy   Hypertension   Hypothyroidism   Diagnostic Studies/Procedures    Cath: 04/07/22     Prox LAD to Mid LAD lesion is 100% stenosed.   Prox RCA lesion is 60% stenosed.   Mid RCA lesion is 90% stenosed.   A drug-eluting stent was successfully placed using a SYNERGY XD 2.75X20.   Post intervention, there is a 0% residual stenosis.   LV end diastolic pressure is severely elevated.   1.  Acute anterior STEMI secondary to thrombotic occlusion of the mid LAD just after the first diagonal, treated with PCI using a 2.75 x 20 mm Synergy DES.  Procedure complicated by no reflow phenomenon likely due to late STEMI presentation with TIMI II flow at the apical portion of the LAD at the completion of the procedure 2.  Severe mid RCA stenosis, recommend staged PCI once she recovers from her anterior infarct 3.  Patent left main and left circumflex with mild nonobstructive plaquing 4.  Severely elevated LVEDP of 28 mmHg consistent with acute systolic heart failure secondary to acute myocardial infarction   Recommendations: ICU care, post MI medical therapy, patient loaded with ticagrelor 180 mg at the completion of the procedure, continue cangrelor x2 hours, consider staged PCI of the RCA prior to discharge pending her clinical course.  Will check 2D echo to assess degree of LV dysfunction.    Diagnostic Dominance: Right  Intervention     Echo: 04/07/22   IMPRESSIONS     1. No apical thrombus with Definity contrast. Left ventricular ejection  fraction, by estimation, is 35 to 40%. The left ventricle has moderately  decreased function. The left ventricle demonstrates regional wall motion  abnormalities (see scoring  diagram/findings for description). Left ventricular diastolic parameters  are consistent with Grade I diastolic dysfunction (impaired relaxation).  There is severe akinesis of the left ventricular, entire anterior wall,  anteroseptal wall, apical segment  and inferoapical segment. 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 The basal inferior and lateral walls are hyperdynamic.   2. Right ventricular systolic function is mildly reduced. The right  ventricular size is normal. Tricuspid regurgitation signal is inadequate  for assessing PA pressure.   3. The mitral valve is abnormal. Trivial mitral valve regurgitation.   4. The aortic valve is tricuspid. Aortic valve regurgitation is not  visualized.   5. The inferior vena cava is normal in size with greater than 50%  respiratory variability, suggesting right atrial pressure of 3 mmHg.   Comparison(s): Changes from prior study are noted. 04/07/2021: LVEF 55-60%,  normal wall motion.   FINDINGS   Left Ventricle: No apical thrombus with Definity contrast. Left  ventricular ejection fraction, by estimation, is 35 to 40%. The left  ventricle has moderately decreased function. The left ventricle  demonstrates regional wall motion abnormalities. Severe  akinesis of the left ventricular, entire anterior wall, anteroseptal wall,  apical segment and inferoapical segment. Definity contrast agent was given  IV  to delineate the left ventricular endocardial borders. The left  ventricular internal cavity size was  normal in size. There is no left ventricular hypertrophy. Left ventricular  diastolic parameters are consistent with Grade I diastolic  dysfunction  (impaired relaxation). Indeterminate filling pressures.   Right Ventricle: The right ventricular size is normal. No increase in  right ventricular wall thickness. Right ventricular systolic function is  mildly reduced. Tricuspid regurgitation signal is inadequate for assessing  PA pressure.   Left Atrium: Left atrial size was normal in size.   Right Atrium: Right atrial size was normal in size.   Pericardium: There is no evidence of pericardial effusion.   Mitral Valve: The mitral valve is abnormal. There is mild calcification of  the posterior mitral valve leaflet(s). Mild mitral annular calcification.  Trivial mitral valve regurgitation.   Tricuspid Valve: The tricuspid valve is grossly normal. Tricuspid valve  regurgitation is trivial.   Aortic Valve: The aortic valve is tricuspid. Aortic valve regurgitation is  not visualized.   Pulmonic Valve: The pulmonic valve was normal in structure. Pulmonic valve  regurgitation is not visualized.   Aorta: The aortic root and ascending aorta are structurally normal, with  no evidence of dilitation.   Venous: The inferior vena cava is normal in size with greater than 50%  respiratory variability, suggesting right atrial pressure of 3 mmHg.   IAS/Shunts: There is right bowing of the interatrial septum, suggestive of  elevated left atrial pressure. No atrial level shunt detected by color  flow Doppler.    Cath: 04/11/22     Mid RCA lesion is 90% stenosed.   Post intervention, there is a 0% residual stenosis.   1.  Successful PTCA and stenting of severe tandem lesions in the RCA using a 3.5 x 38 mm drug-eluting stent 2.  Continued patency of the LAD stent with TIMI-3 flow into the apical LAD   Recommendations: DAPT with clopidogrel x12 months.  Patient has been diagnosed with paroxysmal atrial fibrillation.  We will start apixaban at 2200 today.  Continue aspirin 81 mg x 30 days.   Diagnostic Dominance:  Right  Intervention    _____________   History of Present Illness     Connie [name] is a 81 y.o. female with past medical history of paroxysmal atrial flutter who had declined anticoagulation in the past who presented on 9/14 with chest pain and found to have an anterior STEMI.  Hospital Course     Consultants: EP   Anterior STEMI -- Underwent cardiac catheterization noted above with thrombotic occlusion of mid LAD just after first diagonal treated with PCI/DES x1.  The procedure was complicated by no reflow phenomenon with TIMI II flow at apical portion of LAD at the completion of procedure.  Placed on DAPT with aspirin/Brilinta.  Developed paroxysmal atrial fibrillation and switched from Brilinta to Plavix. Also with severe mid RCA stenosis and underwent staged PCI/DESx1 to [facility] on 9/18.   -- Continue aspirin, Plavix, and Eliquis for one month (then stop ASA) statin    ICM HFrEF -- Echo showed LVEF of 35 to 80%, grade 1 diastolic dysfunction, severe akinesis of the LV, anterior wall, septal and apical segments, mildly reduced RV. -- initially placed on metoprolol after initial cath but stopped with post conversion pauses, this is resolve without recurrence -- continue jardiance, losartan 70m daily, coreg 3.1264mBID and spiro 12.58m23maily   HLD -- LP(a) 36, LDL 54, HDL 50 -- Continue atorvastatin 80 mg daily  HTN -- Stable -- tolerated the addition of coreg 3.129m BID, losartan 26mdaily, spiro 12.3m36maily   Paroxysmal atrial fib RVR Post conversion pauses Tachy/brady syndrome -- Does have history of paroxysmal atrial flutter.  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 TSH WNL. Did have postconversion pauses.  Started on IV amiodarone.  Seen by EP, hopeful to avoid pacemaker implant due to bleeding risk with DAPT.  CHA2DS2-VASc score of at least 4.  -- transitioned to amiodarone 200m71mD 9/18 along with Eliquis -- back in afib RVR the  evening of 9/18, continued episodes of PAF. Increased amiodarone to 400mg43m 9/19, and spoke with EP who recommended small addition of BB. Therefore added coreg 3.123mg 56m She converted and was able to maintain sinus rhythm > 24hrs prior to DC.    Hypothyroidism -- TSH WNL -- continue levothyroxine  General: Well developed, well nourished, female appearing in no acute distress. Head: Normocephalic, atraumatic.  Neck: Supple without bruits, JVD. Lungs:  Resp regular and unlabored, CTA. Heart: RRR, S1, S2, no S3, S4, or murmur; no rub. Abdomen: Soft, non-tender, non-distended with normoactive bowel sounds. No hepatomegaly. No rebound/guarding. No obvious abdominal masses. Extremities: No clubbing, cyanosis, edema. Distal pedal pulses are 2+ bilaterally. R/L radial cath site stable with bruising but no hematoma Neuro: Alert and oriented X 3. Moves all extremities spontaneously. Psych: Normal affect.  Patient seen by Dr. Keilynn Marano Claiborne Billingseemed stable for discharge home.  [place].  Educated by Pharm.Washington Mutualior to discharge.  Did the patient have an acute coronary syndrome (MI, NSTEMI, STEMI, etc) this admission?:  Yes                               AHA/ACC Clinical Performance & Quality Measures: Aspirin prescribed? - Yes ADP Receptor Inhibitor (Plavix/Clopidogrel, Brilinta/Ticagrelor or Effient/Prasugrel) prescribed (includes medically managed patients)? - Yes Beta Blocker prescribed? - Yes High Intensity Statin (Lipitor 40-80mg o78mestor 20-40mg) p26mribed? - Yes EF assessed during THIS hospitalization? - Yes For EF <40%, was ACEI/ARB prescribed? - Yes For EF <40%, Aldosterone Antagonist (Spironolactone or Eplerenone) prescribed? - Yes Cardiac Rehab Phase II ordered (including medically managed patients)? - Yes       The patient will be scheduled for a TOC follow up appointment in 10-14 days.  A message has been sent to the [facility] Pool[facility] Center[facility]uling Pool at the office where the patient should be seen for follow up.  _____________  Discharge Vitals Blood pressure (!) 162/71, pulse (!) 102, temperature 98.1 F (36.7 C), temperature source Oral, resp. rate 20, weight 75.9 kg, SpO2 100 %.  Filed Weights   04/11/22 0330 04/12/22 0404  Weight: 75.1 kg 75.9 kg    Labs & Radiologic Studies    CBC Recent Labs    04/12/22 0425 04/13/22 0330  WBC 12.5* 12.6*  HGB 9.3* 9.4*  HCT 28.5* 28.7*  MCV 85.6 85.9  PLT 292 236   [id number] Metabolic Panel Recent Labs    04/12/22 0425 04/13/22 0330  NA 132* 134*  K 4.1 4.6  CL 104 105  CO2 22 18*  GLUCOSE 95 98  BUN 15 15  CREATININE 0.89 1.10*  CALCIUM 8.5* 8.8*  MG  --  2.0   Liver Function Tests Recent Labs    04/13/22 0330  AST 28  ALT 20  ALKPHOS 67  BILITOT 1.2  PROT 5.3*  ALBUMIN 2.6*   No results for input(s): "LIPASE", "AMYLASE" in the last 72 hours. High Sensitivity Troponin:   Recent Labs  Lab 04/07/22 1044 04/07/22 1306  TROPONINIHS 895* >24,000*    BNP Invalid input(s): "POCBNP" D-Dimer No results for input(s): "DDIMER" in the last 72 hours. Hemoglobin A1C No results for input(s): "HGBA1C" in the last 72 hours. Fasting Lipid Panel No results for input(s): "CHOL", "HDL", "LDLCALC", "TRIG", "CHOLHDL", "LDLDIRECT" in the last 72 hours. Thyroid Function Tests No results for input(s): "TSH", "T4TOTAL", "T3FREE", "THYROIDAB" in the last 72 hours.  Invalid input(s): "FREET3" _____________  CARDIAC CATHETERIZATION  Result Date: 04/11/2022   Mid RCA lesion is 90% stenosed.   Post intervention, there is a 0% residual stenosis. 1.  Successful PTCA and stenting of severe tandem lesions in the RCA using a 3.5 x 38 mm drug-eluting stent 2.  Continued patency of the LAD stent with TIMI-3 flow into the apical LAD Recommendations: DAPT with clopidogrel x12 months.  Patient has been diagnosed with paroxysmal atrial fibrillation.  We will start apixaban at 2200  today.  Continue aspirin 81 mg x 30 days.   ECHOCARDIOGRAM COMPLETE  Result Date: 04/07/2022    ECHOCARDIOGRAM REPORT   Patient Name:   AYRA HODGDON Date of Exam: 04/07/2022 Medical Rec #:  [id number]          Height:       60.0 in Accession #:    [id number]         Weight:       163.4 lb Date of Birth:  01-11-1941          BSA:          1.713 m Patient Age:    56 years           BP:           131/60 mmHg Patient Gender: F                  HR:           63 bpm. Exam Location:  Inpatient Procedure: 2D Echo, Color Doppler, Cardiac Doppler and Intracardiac            Opacification Agent Indications:    R07.9* Chest pain, unspecified  History:        Patient has prior history of Echocardiogram examinations, most                 recent 04/07/2021. Risk Factors:Hypertension.  Sonographer:    Raquel Sarna Senior RDCS Referring Phys: Junior  1. No apical thrombus with Definity contrast. Left ventricular ejection fraction, by estimation, is 35 to 40%. The left ventricle has moderately decreased function. The left ventricle demonstrates regional wall motion abnormalities (see scoring diagram/findings for description). Left ventricular diastolic parameters are consistent with Grade I diastolic dysfunction (impaired relaxation). There is severe akinesis of the left ventricular, entire anterior wall, anteroseptal wall, apical segment and inferoapical segment. 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 The basal inferior and lateral walls are hyperdynamic.  2. Right ventricular systolic function is mildly reduced. The right ventricular size is normal. Tricuspid regurgitation signal is inadequate for assessing PA pressure.  3. The mitral valve is abnormal. Trivial mitral valve regurgitation.  4. The aortic valve is tricuspid. Aortic valve regurgitation is not visualized.  5. The inferior vena cava is normal in size with greater than 50% respiratory variability, suggesting right atrial  pressure of 3 mmHg. Comparison(s): Changes from prior  study are noted. 04/07/2021: LVEF 55-60%, normal wall motion. FINDINGS  Left Ventricle: No apical thrombus with Definity contrast. Left ventricular ejection fraction, by estimation, is 35 to 40%. The left ventricle has moderately decreased function. The left ventricle demonstrates regional wall motion abnormalities. Severe akinesis of the left ventricular, entire anterior wall, anteroseptal wall, apical segment and inferoapical segment. Definity contrast agent was given IV to delineate the left ventricular endocardial borders. The left ventricular internal cavity size was normal in size. There is no left ventricular hypertrophy. Left ventricular diastolic parameters are consistent with Grade I diastolic dysfunction (impaired relaxation). Indeterminate filling pressures. Right Ventricle: The right ventricular size is normal. No increase in right ventricular wall thickness. Right ventricular systolic function is mildly reduced. Tricuspid regurgitation signal is inadequate for assessing PA pressure. Left Atrium: Left atrial size was normal in size. Right Atrium: Right atrial size was normal in size. Pericardium: There is no evidence of pericardial effusion. Mitral Valve: The mitral valve is abnormal. There is mild calcification of the posterior mitral valve leaflet(s). Mild mitral annular calcification. Trivial mitral valve regurgitation. Tricuspid Valve: The tricuspid valve is grossly normal. Tricuspid valve regurgitation is trivial. Aortic Valve: The aortic valve is tricuspid. Aortic valve regurgitation is not visualized. Pulmonic Valve: The pulmonic valve was normal in structure. Pulmonic valve regurgitation is not visualized. Aorta: The aortic root and ascending aorta are structurally normal, with no evidence of dilitation. Venous: The inferior vena cava is normal in size with greater than 50% respiratory variability, suggesting right atrial pressure of 3 mmHg.  IAS/Shunts: There is right bowing of the interatrial septum, suggestive of elevated left atrial pressure. No atrial level shunt detected by color flow Doppler.  LEFT VENTRICLE PLAX 2D LVIDd:         3.20 cm   Diastology LVIDs:         2.40 cm   LV e' medial:    4.57 cm/s LV PW:         1.20 cm   LV E/e' medial:  9.2 LV IVS:        1.00 cm   LV e' lateral:   5.66 cm/s LVOT diam:     1.80 cm   LV E/e' lateral: 7.5 LV SV:         41 LV SV Index:   24 LVOT Area:     2.54 cm  RIGHT VENTRICLE RV S prime:     8.59 cm/s TAPSE (M-mode): 1.6 cm LEFT ATRIUM             Index        RIGHT ATRIUM           Index LA diam:        3.30 cm 1.93 cm/m   RA Area:     10.80 cm LA Vol (A2C):   38.7 ml 22.59 ml/m  RA Volume:   23.30 ml  13.60 ml/m LA Vol (A4C):   33.0 ml 19.26 ml/m LA Biplane Vol: 35.8 ml 20.90 ml/m  AORTIC VALVE LVOT Vmax:   69.80 cm/s LVOT Vmean:  47.800 cm/s LVOT VTI:    0.163 m  AORTA Ao Root diam: 2.90 cm Ao Asc diam:  2.90 cm MITRAL VALVE MV Area (PHT): 1.76 cm     SHUNTS MV Decel Time: 432 msec     Systemic VTI:  0.16 m MV E velocity: 42.20 cm/s   Systemic Diam: 1.80 cm MV A velocity: 112.00 cm/s MV E/A ratio:  0.38 [employer]  MD Electronically signed by Lyman Bishop MD Signature Date/Time: 04/07/2022/4:26:05 PM    Final    CARDIAC CATHETERIZATION  Result Date: 04/07/2022   Prox LAD to Mid LAD lesion is 100% stenosed.   Prox RCA lesion is 60% stenosed.   Mid RCA lesion is 90% stenosed.   A drug-eluting stent was successfully placed using a SYNERGY XD 2.75X20.   Post intervention, there is a 0% residual stenosis.   LV end diastolic pressure is severely elevated. 1.  Acute anterior STEMI secondary to thrombotic occlusion of the mid LAD just after the first diagonal, treated with PCI using a 2.75 x 20 mm Synergy DES.  Procedure complicated by no reflow phenomenon likely due to late STEMI presentation with TIMI II flow at the apical portion of the LAD at the completion of the procedure 2.  Severe mid RCA  stenosis, recommend staged PCI once she recovers from her anterior infarct 3.  Patent left main and left circumflex with mild nonobstructive plaquing 4.  Severely elevated LVEDP of 28 mmHg consistent with acute systolic heart failure secondary to acute myocardial infarction Recommendations: ICU care, post MI medical therapy, patient loaded with ticagrelor 180 mg at the completion of the procedure, continue cangrelor x2 hours, consider staged PCI of the RCA prior to discharge pending her clinical course.  Will check 2D echo to assess degree of LV dysfunction.    Disposition   Pt is being discharged home today in good condition.  Follow-up Plans & Appointments     Follow-up Information     [facility]. Go in 8 day(s).   Specialty: Cardiology Why: Hospital follow up PLEASE bring a current medication list to appointment FREE valet parking, Entrance C, off of [employer] information: [address] [phone] [place]        Darreld Mclean, PA-C Follow up on 04/27/2022.   Specialties: Physician Assistant, Cardiology Why: at 2:45pm for your follow up appt with Dr. Christain Sacramento' [place] information: [address] [place] [address] [phone]                Discharge Instructions     AMB Referral to Cardiac Rehabilitation - Phase II   Complete by: As directed    Diagnosis: STEMI   After initial evaluation and assessments completed: Virtual Based Care may be provided alone or in conjunction with Phase 2 Cardiac Rehab based on patient barriers.: Yes   Intensive Cardiac Rehabilitation (ICR) [place] location only OR Traditional Cardiac Rehabilitation (TCR) *If criteria for ICR are not met will enroll in TCR [facility] only): Yes   Amb Referral to Cardiac Rehabilitation   Complete by: As directed    Diagnosis:  Coronary Stents STEMI     After initial  evaluation and assessments completed: Virtual Based Care may be provided alone or in conjunction with Phase 2 Cardiac Rehab based on patient barriers.: Yes   Intensive Cardiac Rehabilitation (ICR) [place] location only OR Traditional Cardiac Rehabilitation (TCR) *If criteria for ICR are not met will enroll in TCR [facility] only): Yes   Call MD for:  difficulty breathing, headache or visual disturbances   Complete by: As directed    Call MD for:  persistant dizziness or light-headedness   Complete by: As directed    Call MD for:  redness, tenderness, or signs of infection (pain, swelling, redness, odor or green/yellow discharge around incision site)   Complete by: As  directed    Diet - low sodium heart healthy   Complete by: As directed    Discharge instructions   Complete by: As directed    Radial Site Care Refer to this sheet in the next few weeks. These instructions provide you with information on caring for yourself after your procedure. Your caregiver may also give you more specific instructions. Your treatment has been planned according to current medical practices, but problems sometimes occur. Call your caregiver if you have any problems or questions after your procedure. HOME CARE INSTRUCTIONS You may shower the day after the procedure. Remove the bandage (dressing) and gently wash the site with plain soap and water. Gently pat the site dry.  Do not apply powder or lotion to the site.  Do not submerge the affected site in water for 3 to 5 days.  Inspect the site at least twice daily.  Do not flex or bend the affected arm for 24 hours.  No lifting over 5 pounds (2.3 kg) for 5 days after your procedure.  Do not drive home if you are discharged the same day of the procedure. Have someone else drive you.  You may drive 24 hours after the procedure unless otherwise instructed by your caregiver.  What to expect: Any bruising will usually fade within 1 to 2 weeks.  Blood that collects in the tissue  (hematoma) may be painful to the touch. It should usually decrease in size and tenderness within 1 to 2 weeks.  SEEK IMMEDIATE MEDICAL CARE IF: You have unusual pain at the radial site.  You have redness, warmth, swelling, or pain at the radial site.  You have drainage (other than a small amount of blood on the dressing).  You have chills.  You have a fever or persistent symptoms for more than 72 hours.  You have a fever and your symptoms suddenly get worse.  Your arm becomes pale, cool, tingly, or numb.  You have heavy bleeding from the site. Hold pressure on the site.   PLEASE DO NOT MISS ANY DOSES OF YOUR PLAVIX!!!!! Also keep a log of you blood pressures and bring back to your follow up appt. Please call the office with any questions.   Patients taking blood thinners should generally stay away from medicines like ibuprofen, Advil, Motrin, naproxen, and Aleve due to risk of stomach bleeding. You may take Tylenol as directed or talk to your primary doctor about alternatives.  PLEASE ENSURE THAT YOU DO NOT RUN OUT OF YOUR PLAVIX. This medication is very important to remain on for at least one year. IF you have issues obtaining this medication due to cost please CALL the office 3-5 business days prior to running out in order to prevent missing doses of this medication.   For patients with congestive heart failure, we give them these special instructions:  1. Follow a low-salt diet and watch your fluid intake. In general, you should not be taking in more than 2 liters of fluid per day (no more than 8 glasses per day). Some patients are restricted to less than 1.5 liters of fluid per day (no more than 6 glasses per day). This includes sources of water in foods like soup, coffee, tea, milk, etc. 2. Weigh yourself on the same scale at same time of day and keep a log. 3. Call your doctor: (Anytime you feel any of the following symptoms)  - 3-4 pound weight gain in 1-2 days or 2 pounds overnight  -  Shortness  of breath, with or without a dry hacking cough  - Swelling in the hands, feet or stomach  - If you have to sleep on extra pillows at night in order to breathe   IT IS IMPORTANT TO LET YOUR DOCTOR KNOW EARLY ON IF YOU ARE HAVING SYMPTOMS SO WE CAN HELP YOU!   Increase activity slowly   Complete by: As directed        Discharge Medications   Allergies as of 04/14/2022       Reactions   Penicillins Rash, Other (See Comments)   Has patient had a PCN reaction causing immediate rash, facial/tongue/throat swelling, SOB or lightheadedness with hypotension: Yes Has patient had a PCN reaction causing severe rash involving mucus membranes or skin necrosis: No Has patient had a PCN reaction that required hospitalization: No Has patient had a PCN reaction occurring within the last 10 years: No If all of the above answers are "NO", then may proceed with Cephalosporin use.   Sulfa Antibiotics Rash        Medication List     STOP taking these medications    amLODipine 5 MG tablet Commonly known as: NORVASC   irbesartan 150 MG tablet Commonly known as: AVAPRO   naproxen sodium 220 MG tablet Commonly known as: ALEVE   PRESERVISION AREDS 2+MULTI VIT PO   rosuvastatin 10 MG tablet Commonly known as: CRESTOR       TAKE these medications    amiodarone 200 MG tablet Commonly known as: PACERONE Take 2 tablets (400 mg total) by mouth 2 (two) times daily for 4 days, THEN 1 tablet (200 mg total) 2 (two) times daily for 7 days, THEN 1 tablet (200 mg total) daily. Start taking on: April 14, 2022   apixaban 5 MG Tabs tablet Commonly known as: ELIQUIS Take 1 tablet (5 mg total) by mouth 2 (two) times daily.   aspirin EC 81 MG tablet Take 1 tablet (81 mg total) by mouth daily. Swallow whole. Start taking on: April 15, 2022   atorvastatin 80 MG tablet Commonly known as: LIPITOR Take 1 tablet (80 mg total) by mouth daily.   carvedilol 3.125 MG tablet Commonly known  as: COREG Take 1 tablet (3.125 mg total) by mouth 2 (two) times daily with a meal. What changed:  medication strength how much to take when to take this   clopidogrel 75 MG tablet Commonly known as: PLAVIX Take 1 tablet (75 mg total) by mouth daily. Start taking on: April 15, 2022   empagliflozin 10 MG Tabs tablet Commonly known as: JARDIANCE Take 1 tablet (10 mg total) by mouth daily. Start taking on: April 15, 2022   levothyroxine 88 MCG tablet Commonly known as: SYNTHROID Take 88 mcg by mouth daily before breakfast.   losartan 25 MG tablet Commonly known as: COZAAR Take 1 tablet (25 mg total) by mouth daily. Start taking on: April 15, 2022   nitroGLYCERIN 0.4 MG SL tablet Commonly known as: NITROSTAT Place 1 tablet (0.4 mg total) under the tongue every 5 (five) minutes x 3 doses as needed for chest pain.   sertraline 100 MG tablet Commonly known as: ZOLOFT Take 100 mg by mouth daily.   spironolactone 25 MG tablet Commonly known as: ALDACTONE Take 0.5 tablets (12.5 mg total) by mouth daily.           Outstanding Labs/Studies   FLP/LFTs in 8 weeks BMET/CBC at follow up   Duration of Discharge Encounter   Greater than 30 minutes  including physician time.  Signed, Reino Bellis, NP 04/14/2022, 10:31 AM    Patient seen and examined. Agree with assessment and plan. Patient feels well. Maintaining sinus rhythm, currently in the 80s. No angina, no dyspnea. Ischemic cardiomyopathy.  Currently on spironolactone and ARB therapy with losartan SGLT 2 inhibition with Jardiance and carvedilol.  As outpatient, consider transition to Entresto ARB as blood pressure and heart rate allow.  Continue amiodarone 400 mg twice daily for an additional 4 days then decrease to 200 mg twice daily for 1 week with ultimate 200 mg daily dosing.  Continue triple drug therapy for 30 days then DC aspirin and continue Plavix/Eliquis.  Follow-up laboratory on amiodarone as  outpatient.  Stable for DC today.   Troy Sine, MD, [facility] 04/14/2022 10:31 AM

## 2022-04-14 NOTE — TOC Transition Note (Signed)
Transition of Care (TOC) - CM/SW Discharge Note Marvetta Gibbons RN, BSN Transitions of Care Unit 4E- RN Case Manager See Treatment Team for direct phone #    Patient Details  Name: Connie [name] MRN: [id number] Date of Birth: 11-21-1940  Transition of Care [facility]) CM/SW Contact:  Dawayne Patricia, RN Phone Number: 04/14/2022, 12:06 PM   Clinical Narrative:    Pt stable for transition home today, Medications to be filled at [place] prior to discharge and deliver to room.  Benefits check for new meds completed no barriers identified.   Transition of Care Department [facility]) has reviewed patient and no TOC needs have been identified at this time.    Final next level of care: Home/Self Care Barriers to Discharge: No Barriers Identified   Patient Goals and CMS Choice     Choice offered to / list presented to : NA  Discharge Placement                 Home      Discharge Plan and Services     Post Acute Care Choice: NA          DME Arranged: N/A DME Agency: NA       HH Arranged: NA HH Agency: NA        Social Determinants of Health (SDOH) Interventions Food Insecurity Interventions: Intervention Not Indicated Housing Interventions: Intervention Not Indicated Transportation Interventions: Intervention Not Indicated Utilities Interventions: Intervention Not Indicated Alcohol Usage Interventions: Intervention Not Indicated (Score <7) Financial Strain Interventions: Intervention Not Indicated   Readmission Risk Interventions    04/14/2022   12:05 PM  Readmission Risk Prevention Plan  Post Dischage Appt Complete  Medication Screening Complete  Transportation Screening Complete

## 2022-04-14 NOTE — Progress Notes (Signed)
CARDIAC REHAB PHASE I   PRE:  Rate/Rhythm: 89 SR   BP:  Sitting: 128/76      SaO2: 98 RA  MODE:  Ambulation: 150 ft   POST:  Rate/Rhythm: 122 SR   BP:  Sitting: 162/71      SaO2: 99 RA   Pt ambulated in hall with one hand on for help to steady gait. Pt stopped for several standing rest breaks. Pt had c/o mild sob denies cp. Back to bed with call bell and bedside table in reach. Pt is hoping for home today. Will continue to follow.   [id number]  Vanessa Barbara, RN BSN 04/14/2022 9:28 AM

## 2022-04-14 NOTE — Care Management Important Message (Signed)
Important Message  Patient Details  Name: Connie [name] MRN: [id number] Date of Birth: 1940/10/27   Medicare Important Message Given:  Yes     Shelda Altes 04/14/2022, 10:52 AM

## 2022-04-19 ENCOUNTER — Other Ambulatory Visit (HOSPITAL_COMMUNITY): Payer: Self-pay

## 2022-04-19 NOTE — Progress Notes (Signed)
[facility] Transitions of Care Clinic Heart Failure Pharmacist Encounter  PCP: Glenis Smoker, MD PCP-Cardiologist: Donato Heinz, MD  HPI:  Connie [name] is a 81 y.o. female with PMH significant for HTN, hypothyroidism, HLD, GERD, psoriatic arthritis, and PAF. She presented to the ED on 9/14 with sharp substernal and epigastric chest pain starting the night prior and progressing. EKG was consistent with STEMI and she was taken to the cardiac catheterization lab to receive a PCI to LAD on 9/14, followed by a PCI to RCA on 9/18. She also developed tachybradycardia syndrome and atrial fibrillation during this hospitalization. ECHO on 9/14 showed LVEF 35-40% with grade I diastolic dysfunction.   Today, Connie [name] presents to the [facility] for follow up. She reports some SOB with exertion. She denies orthopnea. She has been taking her BP at home with some variations in the readings received. Reported systolic readings in the 270W. Confirmed that she checks her BP after taking her medications. She is currently euvolemic and reports checking her weight at home. Her reported readings are at her discharge weight (164-167 lbs).   Her husband manages her medications and utilizes a pill box, stating that he verifies three times which medications are placed inside. He has also been helping limit her salt intake by eating at home and checking the nutrition labels on their food.   HF Medications: carvedilol 3.125 mg BID, Jardiance 10mg  once daily, losartan 25 mg once daily, Spironolactone 25mg  1/2 tablet once daily    Has the patient been experiencing any side effects to the medications prescribed?  no  Does the patient have any problems obtaining medications due to transportation or finances?   no  Understanding of regimen: excellent Understanding of indications: good Potential of compliance: excellent Patient understands to avoid NSAIDs. Patient  understands to avoid decongestants.   Pertinent Lab Values: Serum creatinine 1.20, BUN 14, Potassium 5.1, Sodium 134, BNP 925.5, Magnesium 2.0 (9/20)  Vital Signs: Weight: 170 lbs (discharge weight: 167 lbs) Blood pressure: 124/78  Heart rate: 79  Medication Assistance / Insurance Benefits Check: Does the patient have prescription insurance?  Yes Type of insurance plan: [employer]  Outpatient Pharmacy:  Current outpatient pharmacy: [facility] Was the [facility] pharmacy used to supply discharge medications? yes  Is the patient willing to transition their outpatient pharmacy to utilize a [facility] outpatient pharmacy with or without mail order?   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 NYHA class II symptoms. - Diuretic therapy not indicated at this time given euvolemic status.  - Appropriate to continue Jardiance at this time. Continue to monitor renal function. -Spironolactone dose currently appropriate. Serum K 5.1 today; recheck BMET at next follow up.  - Given her age and reported home BP readings, will continue Losartan. Could consider dose increase at next visit if BP will allow.  -Will continue current BB therapy and dose for HF GDMT and post-MI. Tachybradycardia syndrome limits the dose of this agent.   Plan: 1) Medication changes: - none at this time  2) Patient Assistance: -Jardiance copay $40  3) Follow up: - Next appointment with Cardiology on 10/4  Maryan Puls, PharmD PGY-1 [facility] Pharmacy Resident

## 2022-04-19 NOTE — Progress Notes (Signed)
HEART & VASCULAR TRANSITION OF CARE CONSULT NOTE     Referring Physician: Dr. Claiborne Billings Primary Care: Dr. Lindell Noe Primary Cardiologist: Dr. Gardiner Rhyme  HPI: Referred to clinic by Dr. Claiborne Billings with [facility] for heart failure consultation. 81 y.o. female with history of paroxysmal atrial flutter (previously declined anticoagulation), HTN, HLD, psoriatic arthritis, GERD, hypothyroidism.   Admitted on 04/07/22 with late presenting anterior STEMI.  LHC with thrombotic occlusion m LAD treated with PCI/DES. Post PCI had TIMI II flow at apical LAD. Also had severe m RCA disease and LVEDP of 28.  She had staged PCI/DES to RCA on 09/18 once recovered from LAD infarct.  Echo 04/07/22: EF 35-40%, akinesis of LV, entire anterior wall, anteroseptal wall, apical segment, and inferoapical segment (wrap-around LAD territory infarct), no LV apical thrombus, RV mildly reduced.   She diuresed with IV lasix and was started on GDMT. Hospital course c/b Afib  with RVR with post-conversion pauses up to 6-7 seconds in duration.  She was started on amiodarone. EP consulted. Hope to avoid pacemaker given need for antiplatelet therapy + anticoagulation.   She is here today for hospital followup. She has been doing well since hospital discharge. No recurrent angina. Notes a little bit of shortness of breath with exertion. This resolves quickly with rest. No orthopnea, PND or LE edema. Home weight since discharge has been averaging between 164-167 lb. No bleeding issues. Taking all medications as prescribed with assistance from her husband. Her husband has also been helping her with checking food labels and watching fluid/sodium intake.   Review of Systems: [y] = yes, [ ]  = no   General: Weight gain [ ] ; Weight loss [ ] ; Anorexia [ ] ; Fatigue [ ] ; Fever [ ] ; Chills [ ] ; Weakness [ ]   Cardiac: Chest pain/pressure [ ] ; Resting SOB [ ] ; Exertional SOB [Y ]; Orthopnea [ ] ; Pedal Edema [ ] ; Palpitations [ ] ; Syncope [ ] ;  Presyncope [ ] ; Paroxysmal nocturnal dyspnea[ ]   Pulmonary: Cough [ ] ; Wheezing[ ] ; Hemoptysis[ ] ; Sputum [ ] ; Snoring [ ]   GI: Vomiting[ ] ; Dysphagia[ ] ; Melena[ ] ; Hematochezia [ ] ; Heartburn[ ] ; Abdominal pain [ ] ; Constipation [ ] ; Diarrhea [ ] ; BRBPR [ ]   GU: Hematuria[ ] ; Dysuria [ ] ; Nocturia[ ]   Vascular: Pain in legs with walking [ ] ; Pain in feet with lying flat [ ] ; Non-healing sores [ ] ; Stroke [ ] ; TIA [ ] ; Slurred speech [ ] ;  Neuro: Headaches[ ] ; Vertigo[ ] ; Seizures[ ] ; Paresthesias[ ] ;Blurred vision [ ] ; Diplopia [ ] ; Vision changes [ ]   Ortho/Skin: Arthritis [ ] ; Joint pain [ ] ; Muscle pain [ ] ; Joint swelling [ ] ; Back Pain [ ] ; Rash [ ]   Psych: Depression[ ] ; Anxiety[ ]   Heme: Bleeding problems [ ] ; Clotting disorders [ ] ; Anemia [Y]  Endocrine: Diabetes [ ] ; Thyroid dysfunction[Y]   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 90 tablet 0   apixaban (ELIQUIS) 5 MG TABS tablet Take 1 tablet (5 mg total) by mouth 2 (two) times daily. 60 tablet 3   aspirin EC 81 MG tablet Take 1 tablet (81 mg total) by mouth daily. Swallow whole. 23 tablet 0   atorvastatin (LIPITOR) 80 MG tablet Take 1 tablet (80 mg total) by mouth daily. 90 tablet 3   carvedilol (COREG) 3.125 MG tablet Take 1 tablet (3.125 mg total) by mouth 2 (two) times daily with a meal. 90 tablet 1   clopidogrel (PLAVIX) 75 MG tablet Take 1 tablet (75 mg  total) by mouth daily. 90 tablet 2   empagliflozin (JARDIANCE) 10 MG TABS tablet Take 1 tablet (10 mg total) by mouth daily. 30 tablet 2   levothyroxine (SYNTHROID) 88 MCG tablet Take 88 mcg by mouth daily before breakfast.     losartan (COZAAR) 25 MG tablet Take 1 tablet (25 mg total) by mouth daily. 30 tablet 2   nitroGLYCERIN (NITROSTAT) 0.4 MG SL tablet Place 1 tablet (0.4 mg total) under the tongue every 5 (five) minutes x 3 doses as needed for chest pain. 25 tablet 2   sertraline (ZOLOFT) 100 MG tablet Take 100 mg by mouth daily.     spironolactone (ALDACTONE) 25 MG tablet Take 1/2 tablet (12.5 mg total) by mouth daily. 30 tablet 1   No current facility-administered medications for this encounter.    Allergies  Allergen Reactions   Penicillins Rash and Other (See Comments)    Has patient had a PCN reaction causing immediate rash, facial/tongue/throat swelling, SOB or lightheadedness with hypotension: Yes Has patient had a PCN reaction causing severe rash involving mucus membranes or skin necrosis: No Has patient had a PCN reaction that required hospitalization: No Has patient had a PCN reaction occurring within the last 10 years: No If all of the above answers are "NO", then may proceed with Cephalosporin use.    Sulfa Antibiotics Rash      Social History   Socioeconomic History   Marital status: Married    Spouse name: [name]   Number of children: 2   Years of education: Not on file   Highest education level: Associate degree: academic program  Occupational History   Occupation: Retired  Tobacco Use   Smoking status: Never   Smokeless tobacco: Never  Vaping Use   Vaping Use: Never used  Substance and Sexual Activity   Alcohol use: Yes    Comment: rarely wine   Drug use: No   Sexual activity: Not on file  Other Topics Concern   Not on file  Social History Narrative   Not on file   Social Determinants of Health   Financial Resource Strain: Low Risk  (04/11/2022)    Overall Financial Resource Strain (CARDIA)    Difficulty of Paying Living Expenses: Not hard at all  Food Insecurity: No Food Insecurity (04/11/2022)   Hunger Vital Sign    Worried About Running Out of Food in the Last Year: Never true    Ran Out of Food in the Last Year: Never true  Transportation Needs: No Transportation Needs (04/11/2022)   PRAPARE - Administrator, Civil Service (Medical): No    Lack of Transportation (Non-Medical): No  Physical Activity: Not on file  Stress: Not on file  Social Connections: Not on file  Intimate Partner Violence: Not  on file      Family History  Problem Relation Age of Onset   Breast cancer Neg Hx     Vitals:   04/20/22 0905  BP: 124/78  Pulse: 79  SpO2: 97%  Weight: 77.4 kg (170 lb 9.6 oz)    PHYSICAL EXAM: General:  Well appearing. Ambulated into clinic. HEENT: normal Neck: supple. no JVD. Carotids 2+ bilat; no bruits.  Cor: PMI nondisplaced. Regular rate & rhythm. No rubs, gallops or murmurs. Lungs: clear Abdomen: soft, nontender, nondistended.  Extremities: no cyanosis, clubbing, rash, edema Neuro: alert & oriented x 3, cranial nerves grossly intact. moves all 4 extremities w/o difficulty. Affect pleasant.  ECG: SR 74 bpm, RBBB, anterior infarct   ASSESSMENT & PLAN: HFrEF/ICM: -Echo 09/22: EF 55-60%, RV okay -Echo 04/07/22: EF 35-40%, akinesis of LV, entire anterior wall, anteroseptal wall, apical segment, and inferoapical segment (wrap-around LAD territory infarct), no LV apical thrombus, RV mildly reduced.  -newly reduced LV function in setting of recent STEMI -Hopefully EF will recover with revascularization and titration of GDMT -Currently NYHA II/early III. Volume looks good on exam. Not requiring loop diuretic. -Continue spiro 12.5 mg daily -Continue losartan 25 mg daily -Continue coreg 3.125 mg BID -Continue jardiance 10 mg daily -Check BMET/BNP today. K variable during recent admit. Depending on K  today, will plan to increase spiro to 25 mg daily. Consider increasing losartan to 50 mg daily next if BP allows (if tolerates higher dose of losartan would then consider transition to entresto, worry about aggressively lowering BP with her advanced age) -Will need repeat echo once on maximally tolerated GDMT -Encouraged cardiac rehab. She has not been called, will reach out to expedite enrollment  2. CAD with recent late presenting anterior STEMI: -LHC 04/07/22: Thrombotic occlusion m LAD treated with PCI/DES. Post PCI had TIMI II flow at apical LAD. Also had severe m RCA disease  -S/p staged PCI/DES to RCA on 04/11/22 -Continue aspirin and plavix X 1 month post PCI. After 1 month, drop aspirin and continue plavix (+ eliquis) -Continue atorvastatin 80 daily -No recurrent angina -Denies bleeding issues. Hgb 9.4 day of discharge. Check CBC today in setting of triple therapy  3. Paroxysmal atrial fibrillation/flutter with post conversion pauses: -Sinus rhythm 74 bpm on ECG today -Denies dizziness or presyncope -Continue amiodarone taper -Anticoagulated with eliquis 5 BID  4. HTN: -BP at goal today. Meds as above  5. HLD:  -Continue Atorvastatin 80 mg daily -Management per Cardiology  NYHA II/early III GDMT  Diuretic-N/a BB-Coreg 3.125 BID Ace/ARB/ARNI-Losartan 25 daily MRA-Spiro 12.5 daily SGLT2i-Jardiance 10 mg daily    Referred to HFSW (PCP, Medications, Transportation, ETOH Abuse, Drug Abuse, Insurance, Surveyor, quantity ): No Refer to Pharmacy: Yes, assist with medication costs Refer to Home Health: No Refer to Advanced Heart Failure Clinic: No Refer to General Cardiology: No, already established  Follow up: HF clinic as needed, f/u with Cardiology as scheduled on 10/04

## 2022-04-20 ENCOUNTER — Telehealth (HOSPITAL_COMMUNITY): Payer: Self-pay

## 2022-04-20 ENCOUNTER — Encounter (HOSPITAL_COMMUNITY): Payer: Self-pay

## 2022-04-20 ENCOUNTER — Telehealth (HOSPITAL_COMMUNITY): Payer: Self-pay | Admitting: *Deleted

## 2022-04-20 ENCOUNTER — Other Ambulatory Visit (HOSPITAL_COMMUNITY): Payer: Self-pay | Admitting: Physician Assistant

## 2022-04-20 ENCOUNTER — Ambulatory Visit (HOSPITAL_COMMUNITY)
Admit: 2022-04-20 | Discharge: 2022-04-20 | Disposition: A | Discharge status: Home / Self Care | Payer: Medicare PPO | Attending: Physician Assistant | Admitting: Physician Assistant

## 2022-04-20 ENCOUNTER — Other Ambulatory Visit (HOSPITAL_COMMUNITY): Payer: Self-pay

## 2022-04-20 VITALS — BP 124/78 | HR 79 | Wt 170.6 lb

## 2022-04-20 DIAGNOSIS — I2510 Atherosclerotic heart disease of native coronary artery without angina pectoris: Secondary | ICD-10-CM | POA: Diagnosis not present

## 2022-04-20 DIAGNOSIS — Z7982 Long term (current) use of aspirin: Secondary | ICD-10-CM | POA: Insufficient documentation

## 2022-04-20 DIAGNOSIS — E039 Hypothyroidism, unspecified: Secondary | ICD-10-CM | POA: Insufficient documentation

## 2022-04-20 DIAGNOSIS — I10 Essential (primary) hypertension: Secondary | ICD-10-CM | POA: Diagnosis not present

## 2022-04-20 DIAGNOSIS — I255 Ischemic cardiomyopathy: Secondary | ICD-10-CM | POA: Diagnosis not present

## 2022-04-20 DIAGNOSIS — I252 Old myocardial infarction: Secondary | ICD-10-CM | POA: Diagnosis not present

## 2022-04-20 DIAGNOSIS — I5020 Unspecified systolic (congestive) heart failure: Secondary | ICD-10-CM | POA: Diagnosis not present

## 2022-04-20 DIAGNOSIS — E782 Mixed hyperlipidemia: Secondary | ICD-10-CM | POA: Diagnosis not present

## 2022-04-20 DIAGNOSIS — E785 Hyperlipidemia, unspecified: Secondary | ICD-10-CM | POA: Diagnosis not present

## 2022-04-20 DIAGNOSIS — Z7902 Long term (current) use of antithrombotics/antiplatelets: Secondary | ICD-10-CM | POA: Diagnosis not present

## 2022-04-20 DIAGNOSIS — Z7901 Long term (current) use of anticoagulants: Secondary | ICD-10-CM | POA: Diagnosis not present

## 2022-04-20 DIAGNOSIS — Z7984 Long term (current) use of oral hypoglycemic drugs: Secondary | ICD-10-CM | POA: Insufficient documentation

## 2022-04-20 DIAGNOSIS — I110 Hypertensive heart disease with heart failure: Secondary | ICD-10-CM | POA: Insufficient documentation

## 2022-04-20 DIAGNOSIS — L4050 Arthropathic psoriasis, unspecified: Secondary | ICD-10-CM | POA: Diagnosis not present

## 2022-04-20 DIAGNOSIS — Z79899 Other long term (current) drug therapy: Secondary | ICD-10-CM | POA: Insufficient documentation

## 2022-04-20 DIAGNOSIS — I5022 Chronic systolic (congestive) heart failure: Secondary | ICD-10-CM | POA: Insufficient documentation

## 2022-04-20 DIAGNOSIS — K219 Gastro-esophageal reflux disease without esophagitis: Secondary | ICD-10-CM | POA: Diagnosis not present

## 2022-04-20 DIAGNOSIS — I480 Paroxysmal atrial fibrillation: Secondary | ICD-10-CM | POA: Diagnosis not present

## 2022-04-20 DIAGNOSIS — Z955 Presence of coronary angioplasty implant and graft: Secondary | ICD-10-CM | POA: Insufficient documentation

## 2022-04-20 DIAGNOSIS — I4892 Unspecified atrial flutter: Secondary | ICD-10-CM | POA: Insufficient documentation

## 2022-04-20 LAB — BASIC METABOLIC PANEL
Anion gap: 9 (ref 5–15)
BUN: 14 mg/dL (ref 8–23)
CO2: 19 mmol/L — ABNORMAL LOW (ref 22–32)
Calcium: 9.1 mg/dL (ref 8.9–10.3)
Chloride: 106 mmol/L (ref 98–111)
Creatinine, Ser: 1.2 mg/dL — ABNORMAL HIGH (ref 0.44–1.00)
GFR, Estimated: 45 mL/min — ABNORMAL LOW (ref >60)
Glucose, Bld: 111 mg/dL — ABNORMAL HIGH (ref 70–99)
Potassium: 5.1 mmol/L (ref 3.5–5.1)
Sodium: 134 mmol/L — ABNORMAL LOW (ref 135–145)

## 2022-04-20 LAB — CBC
HCT: 30.3 % — ABNORMAL LOW (ref 36.0–46.0)
Hemoglobin: 9.6 g/dL — ABNORMAL LOW (ref 12.0–15.0)
MCH: 27.9 pg (ref 26.0–34.0)
MCHC: 31.7 g/dL (ref 30.0–36.0)
MCV: 88.1 fL (ref 80.0–100.0)
Platelets: 565 10*3/uL — ABNORMAL HIGH (ref 150–400)
RBC: 3.44 MIL/uL — ABNORMAL LOW (ref 3.87–5.11)
RDW: 14.4 % (ref 11.5–15.5)
WBC: 18.1 10*3/uL — ABNORMAL HIGH (ref 4.0–10.5)
nRBC: 0 % (ref 0.0–0.2)

## 2022-04-20 LAB — BRAIN NATRIURETIC PEPTIDE: B Natriuretic Peptide: 925.5 pg/mL — ABNORMAL HIGH (ref 0.0–100.0)

## 2022-04-20 MED ORDER — FUROSEMIDE 20 MG PO TABS: 20.0000 mg | ORAL_TABLET | ORAL | 0 refills | Status: DC

## 2022-04-20 NOTE — Telephone Encounter (Signed)
Called patient to see if she is interested in the Cardiac Rehab Program. Patient expressed interest. Explained scheduling process, patient verbalized understanding. Will contact patient for scheduling once f/u has been completed. 

## 2022-04-20 NOTE — Patient Instructions (Addendum)
Medication Changes:  No changes. Continue current regimen.   (Per previous order: decrease Amiodarone to 200mg  (1tab) daily on 10/2.)  Lab Work:  Labs done today, your results will be available in MyChart, we will contact you for abnormal readings.  Testing/Procedures:  None.  Referrals:  None.  Special Instructions // Education:  Do the following things EVERYDAY: Weigh yourself in the morning before breakfast. Write it down and keep it in a log. Take your medicines as prescribed Eat low salt foods--Limit salt (sodium) to 2000 mg per day.  Stay as active as you can everyday Limit all fluids for the day to less than 2 liters   Follow-Up in: as needed. Keep your cardiology follow up appointment 10/4 at the [facility] office.

## 2022-04-20 NOTE — Telephone Encounter (Signed)
Called to confirm Heart & Vascular Transitions of Care appointment at 9 am on 04/20/22. Patient reminded to bring all medications and pill box organizer with them. Confirmed patient has transportation. Gave directions, instructed to utilize [place] parking.  Confirmed appointment prior to ending call.    Connie [name], BSN, Clinical cytogeneticist Only

## 2022-04-20 NOTE — Telephone Encounter (Addendum)
-----   Message from Joette Catching, [place] sent at 04/20/2022 11:12 AM EDT ----- She felt well today other than a little SOB but BNP very elevated. Recommend adding furosemide 20 mg three times a week (M, W, F) and will need lab at f/u with Cardiology on 10/04.  WBC elevated this can happen after an MI but wouldn't expect it to be high this far out. She should get CXR to ensure no pneumonia.   Call before f/u with Cardiology if feeling worse.     Spoke with patient and spouse. Pt aware, agreeable, and verbalized understanding. Called PCP to set up appt to follow up with elevated WBC (pt has hx of chronic asymptomatic UTI, historically followed with Urology, per statement). Appt 9/28 @ 11:30AM with Dr. Lindell Noe. Faxed requested lab results to PCP clinic.  Lasix Rx sent to preferred pharmacy. Explained plan for MWF daily dosing. Pt will keep Cardiology appt 10/4, placed request for repeat labs.

## 2022-04-21 ENCOUNTER — Other Ambulatory Visit: Payer: Self-pay | Admitting: Family Medicine

## 2022-04-21 ENCOUNTER — Ambulatory Visit
Admission: RE | Admit: 2022-04-21 | Discharge: 2022-04-21 | Disposition: A | Discharge status: Home / Self Care | Payer: Medicare PPO | Source: Ambulatory Visit | Attending: Family Medicine | Admitting: Family Medicine

## 2022-04-21 DIAGNOSIS — I2102 ST elevation (STEMI) myocardial infarction involving left anterior descending coronary artery: Secondary | ICD-10-CM | POA: Diagnosis not present

## 2022-04-21 DIAGNOSIS — I5020 Unspecified systolic (congestive) heart failure: Secondary | ICD-10-CM | POA: Diagnosis not present

## 2022-04-21 DIAGNOSIS — D72829 Elevated white blood cell count, unspecified: Secondary | ICD-10-CM

## 2022-04-21 DIAGNOSIS — I495 Sick sinus syndrome: Secondary | ICD-10-CM | POA: Diagnosis not present

## 2022-04-21 DIAGNOSIS — J189 Pneumonia, unspecified organism: Secondary | ICD-10-CM | POA: Diagnosis not present

## 2022-04-21 DIAGNOSIS — R0602 Shortness of breath: Secondary | ICD-10-CM | POA: Diagnosis not present

## 2022-04-24 NOTE — Progress Notes (Deleted)
Cardiology Office Note:    Date:  04/24/2022   ID:  Connie [name], Connie [name] 04-05-1941, MRN [id number]  PCP:  Glenis Smoker, MD  Cardiologist:  Donato Heinz, MD  Electrophysiologist:  None   Referring MD: Glenis Smoker, *   Chief Complaint: hospital follow-up of STEMI, CHF, and atrial fibrillation  History of Present Illness:    Connie [name] is a 81 y.o. 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 Gardiner Rhyme and presents today for hospital follow-up after STEMI.  Patient was initially referred to Dr. Gardiner Rhyme in 02/2021 for further evaluation of palpitations. Monitor was ordered and showed paroxsymal atrial flutter (2% burden) with longest episode lasting 15 minutes. Echo showed normal LV function. Eliquis was recommended but she declined anticoagulation.   Patient was recently admitted from 04/07/2022 to 04/14/2022 for an acute anterior STEMI after presenting with chest pain. High-sensitivity troponin peaked at >24,000. Emergent cardiac catheterization showed 100% stenosis of proximal to mid LAD as well as 60% stenosis of proximal RCA followed by 90% stenosis of mid RCA. Also showed severely elevated LVEDP. She underwent successful PCI with DES to LAD with plans for staged PCI of RCA. Echo showed LVEF of 35-40% with severe akinesis of the entire anterior wall, anteroseptal wall, apical segment and inferoapical segment as well as mildly reduced RV systolic function.  She was taken back to the cath lab on 04/11/2022 for staged PCI with DES to mid RCA. She was initially started on DAPT with Aspirin and Brilinta following PCI to LAD; however, she then developed paroxysmal atrial  fibrillation so Brilinta was stopped and she was transitioned to Plavix with plans for triple therapy with Aspirin, Plavix, and Eliquis for 1 month at which time Aspirin can be stopped. She was started on IV Amiodarone and then ultimately transitioned ot PO Amiodarone. She initially was started on Metoprolol was stopped due to post conversion pauses. EP was consulted and recommended avoiding pacemaker implantation given bleeding risk after starting DAPT. Given intermittent episode of RVR while on Amiodarone, EP recommended adding low dose of Coreg. She was also started on GDMT for her ischemic cardiomyopathy including Losartan, Spironolactone, and Jardiance.   Patient was seen in the [facility] for follow-up on 04/20/2022 at which time she reported some dyspnea with exertion but no orthopnea. She was euvolemic on exam. Labs drawn that day showed an increase in BNP to the 900s so she was started on Lasix 20mg  day three times weeks (M/W/F). WBC was also noted to be elevated at 18,100 so she was advised to follow up with PCP.  Patient presents today for follow-up. ***  CAD with Recent STEMI Patient was recently admitted with anterior STEMI on 04/07/2022 and underwent DES to LAD and staged PCI with DES to RCA.  - No recurrent chest pain - Plan is for triple therapy with Aspirin 81mg  daily, Plavix, and Eliquis 5mg  twice daily for 1 month after PCI and then Aspirin can be stopped. Can stop Aspirin on 05/11/2022. Continue Plavix for at least 12 months. - Continue beta-blocker and high-intensity statin.  Ischemic Cardiomyopathy Chronic Combined CHF Echo during recent admission for STEMI showed LVEF of 35-40% with severe akinesis of the entire anterior wall, anteroseptal wall,  apical segment and inferoapical segment as well as mildly reduced RV systolic function. - Euvolemic on exam. - Continue Lasix 20mg  three times weekly (M/W/F). - Continue Losartan 25mg  daily. - Continue Coreg 3.125mg  twice  daily. - Continue Spironolactone 12.5mg  daily. - Continue Jardiance 10mg  daily. - Will repeat BNP and BMET today. - Will plan to repeat Echo in 3 months to reassess LV function.  Paroxysmal Atrial Fibrillation/Flutter Post-Conversion Pauses Tachybrady Syndrome Patient has a history of atrial flutter and then as noted to have paroxysmal atrial fibrillation with post conversion pauses during recent admission for STEMI. She was started on Amiodarone and seen by EP who did not recommend pacemaker at that time given bleeding risk while on DAPT. - Maintaining sinus rhythm. - Continue Amiodarone 200mg  daily. - Continue Coreg 3.125mg  twice daily. - Continue chronic anticoagulation with Eliquis 5mg  twice daily.  Hypertension BP well controlled. - Continue medications for CHF as above.  Hyperlipidemia Lipid panel on 04/07/2022: Total Cholesterol, Triglycerides 103, HDL 50, LDL 54.  - Continue Lipitor 80mg  daily.  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   Multiple, noted on   History of hiatal hernia    Hypertension    Hypothyroidism    Macular degeneration    Psoriatic arthritis (HCC)    Sinus drainage    UTI (urinary tract infection)     Past Surgical History:  Procedure Laterality Date   BACK SURGERY  2000   Dr   bladder tack  2005   COLONOSCOPY     CORONARY STENT INTERVENTION N/A 04/11/2022   Procedure: CORONARY STENT INTERVENTION;  Surgeon: 12/27/2016, MD;  Location: [facility] INVASIVE CV LAB;  Service: Cardiovascular;  Laterality: N/A;   DILATION AND CURETTAGE OF UTERUS     after miscarriage   LEFT HEART CATH AND CORONARY ANGIOGRAPHY N/A 04/07/2022   Procedure: LEFT HEART CATH AND CORONARY ANGIOGRAPHY;  Surgeon: Newell Coral, MD;  Location: [facility] INVASIVE CV LAB;  Service: Cardiovascular;  Laterality: N/A;   RECTAL PROLAPSE REPAIR  2008   SCAR REVISION  Right 2017   Knee, [facility]   TONSILLECTOMY     as a child   TOTAL KNEE ARTHROPLASTY Right 09/05/2014   Procedure: TOTAL KNEE ARTHROPLASTY;  Surgeon: Tonny Bollman, MD;  Location: [facility] OR;  Service: Orthopedics;  Laterality: Right;   TOTAL KNEE ARTHROPLASTY Left 01/10/2022   Procedure: TOTAL KNEE ARTHROPLASTY;  Surgeon: 2009, MD;  Location: [facility] ORS;  Service: Orthopedics;  Laterality: Left;   TOTAL SHOULDER ARTHROPLASTY Left 11/17/2017   Procedure: LEFT TOTAL SHOULDER ARTHROPLASTY;  Surgeon: Luiz Blare, MD;  Location: [facility] ORS;  Service: Orthopedics;  Laterality: Left;  with block   TUBAL LIGATION  1972    Current Medications: No outpatient medications have been marked as taking for the 04/27/22 encounter (Appointment) with Harvie Junior, PA-C.     Allergies:   Penicillins and Sulfa antibiotics   Social History   Socioeconomic History   Marital status: Married    Spouse name: 01/12/2022   Number of children: 2   Years of education: Not on file   Highest education level: Associate degree: academic program  Occupational History   Occupation: Retired  Tobacco Use   Smoking status: Never   Smokeless tobacco: Never  Vaping Use   Vaping Use: Never used  Substance and Sexual Activity  Alcohol use: Yes    Comment: rarely wine   Drug use: No   Sexual activity: Not on file  Other Topics Concern   Not on file  Social History Narrative   Not on file   Social Determinants of Health   Financial Resource Strain: Low Risk  (04/11/2022)   Overall Financial Resource Strain (CARDIA)    Difficulty of Paying Living Expenses: Not hard at all  Food Insecurity: No Food Insecurity (04/11/2022)   Hunger Vital Sign    Worried About Running Out of Food in the Last Year: Never true    Ran Out of Food in the Last Year: Never true  Transportation Needs: No Transportation Needs (04/11/2022)   PRAPARE - Administrator, Civil Service (Medical): No    Lack of Transportation  (Non-Medical): No  Physical Activity: Not on file  Stress: Not on file  Social Connections: Not on file     Family History: The patient's family history is negative for Breast cancer.  ROS:   Please see the history of present illness.     EKGs/Labs/Other Studies Reviewed:    The following studies were reviewed:  Left Cardiac Catheterization 04/07/2022:   Prox LAD to Mid LAD lesion is 100% stenosed.   Prox RCA lesion is 60% stenosed.   Mid RCA lesion is 90% stenosed.   A drug-eluting stent was successfully placed using a SYNERGY XD 2.75X20.   Post intervention, there is a 0% residual stenosis.   LV end diastolic pressure is severely elevated.   1.  Acute anterior STEMI secondary to thrombotic occlusion of the mid LAD just after the first diagonal, treated with PCI using a 2.75 x 20 mm Synergy DES.  Procedure complicated by no reflow phenomenon likely due to late STEMI presentation with TIMI II flow at the apical portion of the LAD at the completion of the procedure 2.  Severe mid RCA stenosis, recommend staged PCI once she recovers from her anterior infarct 3.  Patent left main and left circumflex with mild nonobstructive plaquing 4.  Severely elevated LVEDP of 28 mmHg consistent with acute systolic heart failure secondary to acute myocardial infarction   Recommendations: ICU care, post MI medical therapy, patient loaded with ticagrelor 180 mg at the completion of the procedure, continue cangrelor x2 hours, consider staged PCI of the RCA prior to discharge pending her clinical course.  Will check 2D echo to assess degree of LV dysfunction.   Diagnostic Dominance: Right  Intervention   _______________  Echocardiogram 04/07/2022: Impressions:  1. No apical thrombus with Definity contrast. Left ventricular ejection  fraction, by estimation, is 35 to 40%. The left ventricle has moderately  decreased function. The left ventricle demonstrates regional wall motion  abnormalities  (see scoring  diagram/findings for description). Left ventricular diastolic parameters  are consistent with Grade I diastolic dysfunction (impaired relaxation).  There is severe akinesis of the left ventricular, entire anterior wall,  anteroseptal wall, apical segment  and inferoapical segment. 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 The basal inferior and lateral walls are hyperdynamic.   2. Right ventricular systolic function is mildly reduced. The right  ventricular size is normal. Tricuspid regurgitation signal is inadequate  for assessing PA pressure.   3. The mitral valve is abnormal. Trivial mitral valve regurgitation.   4. The aortic valve is tricuspid. Aortic valve regurgitation is not  visualized.   5. The inferior vena cava is normal in size with greater than 50%  respiratory  variability, suggesting right atrial pressure of 3 mmHg.   Comparison(s): Changes from prior study are noted. 04/07/2021: LVEF 55-60%, normal wall motion. _______________  Coronary Stent Intervention 04/11/2022:   Mid RCA lesion is 90% stenosed.   Post intervention, there is a 0% residual stenosis.   1.  Successful PTCA and stenting of severe tandem lesions in the RCA using a 3.5 x 38 mm drug-eluting stent 2.  Continued patency of the LAD stent with TIMI-3 flow into the apical LAD   Recommendations: DAPT with clopidogrel x12 months.  Patient has been diagnosed with paroxysmal atrial fibrillation.  We will start apixaban at 2200 today.  Continue aspirin 81 mg x 30 days.  Diagnostic Dominance: Right  Intervention     EKG:  EKG ordered today. EKG personally reviewed and demonstrates ***.  Recent Labs: 04/08/2022: TSH 0.773 04/13/2022: ALT 20; Magnesium 2.0 04/20/2022: B Natriuretic Peptide 925.5; BUN 14; Creatinine, Ser 1.20; Hemoglobin 9.6; Platelets 565; Potassium 5.1; Sodium 134  Recent Lipid Panel    Component Value Date/Time   CHOL 125 04/07/2022 1044   CHOL 151  07/27/2021 1527   TRIG 103 04/07/2022 1044   HDL 50 04/07/2022 1044   HDL 50 07/27/2021 1527   CHOLHDL 2.5 04/07/2022 1044   VLDL 21 04/07/2022 1044   LDLCALC 54 04/07/2022 1044   LDLCALC 75 07/27/2021 1527    Physical Exam:    Vital Signs: There were no vitals taken for this visit.    Wt Readings from Last 3 Encounters:  04/20/22 170 lb 9.6 oz (77.4 kg)  04/12/22 167 lb 5.3 oz (75.9 kg)  03/30/22 163 lb 6.4 oz (74.1 kg)     General: 81 y.o. female in no acute distress. HEENT: Normocephalic and atraumatic. Sclera clear. EOMs intact. Neck: Supple. No carotid bruits. No JVD. Heart: *** RRR. Distinct S1 and S2. No murmurs, gallops, or rubs. Radial and distal pedal pulses 2+ and equal bilaterally. Lungs: No increased work of breathing. Clear to ausculation bilaterally. No wheezes, rhonchi, or rales.  Abdomen: Soft, non-distended, and non-tender to palpation. Bowel sounds present in all 4 quadrants.  MSK: Normal strength and tone for age. *** Extremities: No lower extremity edema.    Skin: Warm and dry. Neuro: Alert and oriented x3. No focal deficits. Psych: Normal affect. Responds appropriately.   Assessment:    No diagnosis found.  Plan:     Disposition: Follow up in ***   Medication Adjustments/Labs and Tests Ordered: Current medicines are reviewed at length with the patient today.  Concerns regarding medicines are outlined above.  No orders of the defined types were placed in this encounter.  No orders of the defined types were placed in this encounter.   There are no Patient Instructions on file for this visit.   Signed, Corrin Parker, PA-C  04/24/2022 8:47 PM    [facility]

## 2022-04-26 ENCOUNTER — Telehealth: Payer: Self-pay | Admitting: Cardiology

## 2022-04-26 NOTE — Telephone Encounter (Signed)
Reviewed patient's medication list (from hospital d/c) with spouse. Do you approve the cardiac medications for future refills?  Apixaban 5mg  twice daily ASA 81mg  daily Lipitor 80mg  daily Coreg 3.125 twice daily Plavix 75mg  daily Jardiance 10mg  daily Laisx 20mg  M-W-F Losartan 25mg  daily  NTG 0.4mg  prn Aldactone 25mg  daily

## 2022-04-26 NOTE — Telephone Encounter (Signed)
Husband would like to speak with Callie in regards medication for patient. Please advise

## 2022-04-27 ENCOUNTER — Ambulatory Visit: Payer: Medicare PPO | Admitting: Student

## 2022-04-27 NOTE — Telephone Encounter (Signed)
Patient has not been seen since her recent admission for STEMI, needs follow-up appointment.  Can we get her scheduled soon?

## 2022-04-28 NOTE — Telephone Encounter (Signed)
Spoke to husband (ok per DPR)-appt scheduled for follow up

## 2022-05-02 ENCOUNTER — Other Ambulatory Visit: Payer: Self-pay

## 2022-05-02 ENCOUNTER — Ambulatory Visit: Payer: Medicare PPO | Attending: Nurse Practitioner

## 2022-05-02 ENCOUNTER — Encounter: Payer: Self-pay | Admitting: Nurse Practitioner

## 2022-05-02 ENCOUNTER — Ambulatory Visit: Payer: Medicare PPO | Attending: Nurse Practitioner | Admitting: Nurse Practitioner

## 2022-05-02 VITALS — BP 122/70 | HR 79 | Ht 60.0 in | Wt 162.6 lb

## 2022-05-02 DIAGNOSIS — E785 Hyperlipidemia, unspecified: Secondary | ICD-10-CM | POA: Diagnosis not present

## 2022-05-02 DIAGNOSIS — Z79899 Other long term (current) drug therapy: Secondary | ICD-10-CM

## 2022-05-02 DIAGNOSIS — R42 Dizziness and giddiness: Secondary | ICD-10-CM | POA: Diagnosis not present

## 2022-05-02 DIAGNOSIS — I5020 Unspecified systolic (congestive) heart failure: Secondary | ICD-10-CM | POA: Diagnosis not present

## 2022-05-02 DIAGNOSIS — I2510 Atherosclerotic heart disease of native coronary artery without angina pectoris: Secondary | ICD-10-CM | POA: Diagnosis not present

## 2022-05-02 DIAGNOSIS — I480 Paroxysmal atrial fibrillation: Secondary | ICD-10-CM | POA: Diagnosis not present

## 2022-05-02 DIAGNOSIS — I255 Ischemic cardiomyopathy: Secondary | ICD-10-CM

## 2022-05-02 DIAGNOSIS — I10 Essential (primary) hypertension: Secondary | ICD-10-CM | POA: Diagnosis not present

## 2022-05-02 DIAGNOSIS — I495 Sick sinus syndrome: Secondary | ICD-10-CM

## 2022-05-02 MED ORDER — APIXABAN 5 MG PO TABS: 5.0000 mg | ORAL_TABLET | Freq: Two times a day (BID) | ORAL | 3 refills | Status: DC

## 2022-05-02 MED ORDER — LOSARTAN POTASSIUM 25 MG PO TABS: 25.0000 mg | ORAL_TABLET | Freq: Every day | ORAL | 3 refills | Status: DC

## 2022-05-02 MED ORDER — EMPAGLIFLOZIN 10 MG PO TABS: 10.0000 mg | ORAL_TABLET | Freq: Every day | ORAL | 3 refills | Status: DC

## 2022-05-02 NOTE — Progress Notes (Unsigned)
Enrolled for [employer] to mail a ZIO XT long term holter monitor to the patients address on file.  ? ?Dr. Schumann to read. ?

## 2022-05-02 NOTE — Progress Notes (Signed)
Office Visit    Patient Name: Connie [name] Date of Encounter: 05/02/2022  Primary Care Provider:  Glenis Smoker, MD Primary Cardiologist:  Donato Heinz, MD  Chief Complaint    81 year old female with a history of CAD, chronic systolic heart failure, ICM, paroxysmal atrial flutter, hypertension, hyperlipidemia, hypothyroidism, GERD, and psoriatic arthritis who presents for follow-up related to CAD and heart failure.  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, noted on [place]   History of hiatal hernia    Hypertension    Hypothyroidism    Macular degeneration    Psoriatic arthritis ([place])    Sinus drainage    UTI (urinary tract infection)    Past Surgical History:  Procedure Laterality Date   BACK SURGERY  2000   Dr Sherwood Gambler   bladder tack  2005   COLONOSCOPY     CORONARY STENT INTERVENTION N/A 04/11/2022   Procedure: CORONARY STENT INTERVENTION;  Surgeon: Sherren Mocha, MD;  Location: [place] CV LAB;  Service: Cardiovascular;  Laterality: N/A;   DILATION AND CURETTAGE OF UTERUS     after miscarriage   LEFT HEART CATH AND CORONARY ANGIOGRAPHY N/A 04/07/2022   Procedure: LEFT HEART CATH AND CORONARY ANGIOGRAPHY;  Surgeon: Sherren Mocha, MD;  Location: [place] CV LAB;  Service: Cardiovascular;  Laterality: N/A;   RECTAL PROLAPSE REPAIR  2008   SCAR REVISION Right 2017   Knee, Dr. Berenice Primas   TONSILLECTOMY     as a child   TOTAL KNEE ARTHROPLASTY Right 09/05/2014   Procedure: TOTAL KNEE ARTHROPLASTY;  Surgeon: Alta Corning, MD;  Location: [place];  Service: Orthopedics;  Laterality: Right;   TOTAL KNEE ARTHROPLASTY Left 01/10/2022   Procedure: TOTAL KNEE ARTHROPLASTY;  Surgeon: Dorna Leitz, MD;  Location: [facility];  Service: Orthopedics;  Laterality: Left;   TOTAL SHOULDER ARTHROPLASTY Left 11/17/2017    Procedure: LEFT TOTAL SHOULDER ARTHROPLASTY;  Surgeon: Dorna Leitz, MD;  Location: [facility];  Service: Orthopedics;  Laterality: Left;  with block   TUBAL LIGATION  1972    Allergies  Allergies  Allergen Reactions   Penicillins Rash and Other (See Comments)    Has patient had a PCN reaction causing immediate rash, facial/tongue/throat swelling, SOB or lightheadedness with hypotension: Yes Has patient had a PCN reaction causing severe rash involving mucus membranes or skin necrosis: No Has patient had a PCN reaction that required hospitalization: No Has patient had a PCN reaction occurring within the last 10 years: No If all of the above answers are "NO", then may proceed with Cephalosporin use.    Sulfa Antibiotics Rash    History of Present Illness    81 year old female with the above past medical history including CAD, chronic systolic heart failure, ICM, paroxysmal atrial flutter, hypertension, hyperlipidemia, hypothyroidism, GERD, and psoriatic arthritis.  She was referred to Dr. Gardiner Rhyme in August 2022 in the setting of palpitations.  7-day Zio patch in 03/2021 revealed atrial flutter 2% burden), echocardiogram in 03/2021 showed normal biventricular function, no significant valvular disease.  Calcium score in 03/2021 was 604 (81st percentile). Lexiscan Myoview in 07/2021 showed no evidence of ischemia, EF 79%.  She was last seen in the office on 03/30/2022 and was stable from a cardiac standpoint.  She denied symptoms concerning for angina.  She presented to the  ED on 04/07/2022 in the setting of anterior STEMI.  LHC showed thrombotic occlusion to mLAD s/p DES.  He was noted to have severe MRCA disease staged PCI/DES-RCA on 04/11/2022.  Echocardiogram on 04/07/2022 showed EF 35 to 40%, akinesis of LV, entire anterior wall, anteroseptal wall, apical segment, and a inferior apical segment, no LV apical thrombus, RV mildly reduced.  She was diuresed with IV Lasix and started on GDMT.  Her hospital  course was complicated by atrial fibrillation with RVR postconversion pauses up to 6 to 7 seconds in duration.  She was started on amiodarone.  EP was consulted.  PPM was deferred.  She is on Eliquis.  She was seen in the heart and vascular transition of care clinic on 04/20/2022 and was stable from a cardiac standpoint.   She presents today for follow-up accompanied by her husband.  Since her last visit she has been stable from a cardiac standpoint. She has noted some mild intermittent dizziness, she denies presyncope, syncope.  She denies chest pain, palpitations, dyspnea, edema, PND, orthopnea, weight gain.  Other than her intermittent dizziness, she reports feeling well denies any additional concerns today.  Home Medications    Current Outpatient Medications  Medication Sig Dispense Refill   aspirin EC 81 MG tablet Take 1 tablet (81 mg total) by mouth daily. Swallow whole. 23 tablet 0   atorvastatin (LIPITOR) 80 MG tablet Take 1 tablet (80 mg total) by mouth daily. 90 tablet 3   carvedilol (COREG) 3.125 MG tablet Take 1 tablet (3.125 mg total) by mouth 2 (two) times daily with a meal. 90 tablet 1   clopidogrel (PLAVIX) 75 MG tablet Take 1 tablet (75 mg total) by mouth daily. 90 tablet 2   furosemide (LASIX) 20 MG tablet Take 1 tablet (20 mg total) by mouth every Monday, Wednesday, and Friday. 30 tablet 0   levothyroxine (SYNTHROID) 88 MCG tablet Take 88 mcg by mouth daily before breakfast.     nitroGLYCERIN (NITROSTAT) 0.4 MG SL tablet Place 1 tablet (0.4 mg total) under the tongue every 5 (five) minutes x 3 doses as needed for chest pain. 25 tablet 2   sertraline (ZOLOFT) 100 MG tablet Take 100 mg by mouth daily.     spironolactone (ALDACTONE) 25 MG tablet Take 1/2 tablet (12.5 mg total) by mouth daily. 30 tablet 1   amiodarone (PACERONE) 200 MG tablet Take 2 tablets (400 mg total) by mouth 2 (two) times daily for 4 days, THEN 1 tablet (200 mg total) 2 (two) times daily for 7 days, THEN 1 tablet  (200 mg total) daily. (Patient not taking: Reported on 05/02/2022) 90 tablet 0   apixaban (ELIQUIS) 5 MG TABS tablet Take 1 tablet (5 mg total) by mouth 2 (two) times daily. 60 tablet 3   empagliflozin (JARDIANCE) 10 MG TABS tablet Take 1 tablet (10 mg total) by mouth daily. 30 tablet 3   losartan (COZAAR) 25 MG tablet Take 1 tablet (25 mg total) by mouth daily. 30 tablet 3   No current facility-administered medications for this visit.     Review of Systems    She denies chest pain, palpitations, dyspnea, pnd, orthopnea, n, v, syncope, edema, weight gain, or early satiety. All other systems reviewed and are otherwise negative except as noted above.     Cardiac Rehabilitation Eligibility Assessment  The patient is ready to start cardiac rehabilitation from a cardiac standpoint.    Physical Exam    VS:  BP 122/70   Pulse  79   Ht 5' (1.524 m)   Wt 162 lb 9.6 oz (73.8 kg)   SpO2 98%   BMI 31.76 kg/m  , BMI Body mass index is 31.76 kg/m. STOP-Bang Score:  4      GEN: Well nourished, well developed, in no acute distress. HEENT: normal. Neck: Supple, no JVD, carotid bruits, or masses. Cardiac: RRR, no murmurs, rubs, or gallops. No clubbing, cyanosis, edema.  Radials/DP/PT 2+ and equal bilaterally.  Respiratory:  Respirations regular and unlabored, clear to auscultation bilaterally. GI: Soft, nontender, nondistended, BS + x 4. MS: no deformity or atrophy. Skin: warm and dry, no rash. Neuro:  Strength and sensation are intact. Psych: Normal affect.  Accessory Clinical Findings    ECG personally reviewed by me today -No EKG in office today.   Lab Results  Component Value Date   WBC 18.1 (H) 04/20/2022   HGB 9.6 (L) 04/20/2022   HCT 30.3 (L) 04/20/2022   MCV 88.1 04/20/2022   PLT 565 (H) 04/20/2022   Lab Results  Component Value Date   CREATININE 1.20 (H) 04/20/2022   BUN 14 04/20/2022   NA 134 (L) 04/20/2022   K 5.1 04/20/2022   CL 106 04/20/2022   CO2 19 (L)  04/20/2022   Lab Results  Component Value Date   ALT 20 04/13/2022   AST 28 04/13/2022   ALKPHOS 67 04/13/2022   BILITOT 1.2 04/13/2022   Lab Results  Component Value Date   CHOL 125 04/07/2022   HDL 50 04/07/2022   LDLCALC 54 04/07/2022   TRIG 103 04/07/2022   CHOLHDL 2.5 04/07/2022    Lab Results  Component Value Date   HGBA1C 5.6 04/07/2022    Assessment & Plan    1. CAD: S/p DES-mLAD, DES-RCA on 03/2022.  Echo showed EF 35 to 40%, akinesis of LV, entire anterior wall, anteroseptal wall, apical segment, and a inferior apical segment, no LV apical thrombus, RV mildly reduced.  Stable with no anginal symptoms. Continue aspirin (through 05/11/22), Plavix, carvedilol, losartan, Jardiance, Lasix, spironolactone, and Lipitor.   2. Chronic systolic heart failure/ICM: Most recent echo as above (EF 35 to 40%).  Euvolemic and well compensated on exam.  Continue current medications as above. Further escalation of GDMT limited in the setting of borderline BP, dizziness, borderline high potassium.  Will repeat BMET.  Will plan for repeat echo in mid December 2023.  Continue current medications as above.   3. Paroxysmal atrial fibrillation/tachycardia-bradycardia syndrome: Appears to be maintaining sinus rhythm. She does note some intermittent dizziness, denies any palpitations, presyncope, syncope.  Will check 14-day ZIO given postconversion pauses during recent hospitalization. Discussed ED precautions. Pending monitor results, consider follow-up with EP.  Continue Eliquis, carvedilol.  4. Hypertension: BP well controlled, borderline soft at times. Continue current antihypertensive regimen.   5. Hyperlipidemia: Recently transitioned from rosuvastatin to atorvastatin 80 mg daily.  LDL was 54 in 03/2022.  Will repeat fasting lipids, LFTs in 1 month.  6. Disposition: Follow-up in 2-3 months.      Lenna Sciara, NP 05/02/2022, 5:26 PM

## 2022-05-02 NOTE — Patient Instructions (Signed)
Medication Instructions:  Your physician recommends that you continue on your current medications as directed. Please refer to the Current Medication list given to you today.   *If you need a refill on your cardiac medications before your next appointment, please call your pharmacy*   Lab Work: Your physician recommends that you complete lab work today and return for lab work in: 2 weeks  BMET (today) Lipids & LFT (2 weeks)  If you have labs (blood work) drawn today and your tests are completely normal, you will receive your results only by: [place] (if you have MyChart) OR A paper copy in the mail If you have any lab test that is abnormal or we need to change your treatment, we will call you to review the results.   Testing/Procedures: Your physician has requested that you have an echocardiogram. Echocardiography is a painless test that uses sound waves to create images of your heart. It provides your doctor with information about the size and shape of your heart and how well your heart's chambers and valves are working. This procedure takes approximately one hour. There are no restrictions for this procedure.   ZIO XT- Long Term Monitor Instructions  Your physician has requested you wear a ZIO patch monitor for 14 days.  This is a single patch monitor. [employer] supplies one patch monitor per enrollment. Additional stickers are not available. Please do not apply patch if you will be having a Nuclear Stress Test,  Echocardiogram, Cardiac CT, MRI, or Chest Xray during the period you would be wearing the  monitor. The patch cannot be worn during these tests. You cannot remove and re-apply the  ZIO XT patch monitor.  Your ZIO patch monitor will be mailed 3 day USPS to your address on file. It may take 3-5 days  to receive your monitor after you have been enrolled.  Once you have received your monitor, please review the enclosed instructions. Your monitor  has already been  registered assigning a specific monitor serial # to you.  Billing and Patient Assistance Program Information  We have supplied [employer] with any of your insurance information on file for billing purposes. [employer] offers a sliding scale Patient Assistance Program for patients that do not have  insurance, or whose insurance does not completely cover the cost of the ZIO monitor.  You must apply for the Patient Assistance Program to qualify for this discounted rate.  To apply, please call [employer] at [phone], select option 4, select option 2, ask to apply for  Patient Assistance Program. Theodore Demark will ask your household income, and how many people  are in your household. They will quote your out-of-pocket cost based on that information.  [employer] will also be able to set up a 45-month, interest-free payment plan if needed.  Applying the monitor   Shave hair from upper left chest.  Hold abrader disc by orange tab. Rub abrader in 40 strokes over the upper left chest as  indicated in your monitor instructions.  Clean area with 4 enclosed alcohol pads. Let dry.  Apply patch as indicated in monitor instructions. Patch will be placed under collarbone on left  side of chest with arrow pointing upward.  Rub patch adhesive wings for 2 minutes. Remove white label marked "1". Remove the white  label marked "2". Rub patch adhesive wings for 2 additional minutes.  While looking in a mirror, press and release button in center of patch. A small green light will  flash 3-4 times. This  will be your only indicator that the monitor has been turned on.  Do not shower for the first 24 hours. You may shower after the first 24 hours.  Press the button if you feel a symptom. You will hear a small click. Record Date, Time and  Symptom in the Patient Logbook.  When you are ready to remove the patch, follow instructions on the last 2 pages of Patient  Logbook. Stick patch monitor onto the last page of Patient  Logbook.  Place Patient Logbook in the blue and white box. Use locking tab on box and tape box closed  securely. The blue and white box has prepaid postage on it. Please place it in the mailbox as  soon as possible. Your physician should have your test results approximately 7 days after the  monitor has been mailed back to [facility].  Call [facility] Customer Care at [phone] if you have questions regarding  your ZIO XT patch monitor. Call them immediately if you see an orange light blinking on your  monitor.  If your monitor falls off in less than 4 days, contact our Monitor department at [phone].  If your monitor becomes loose or falls off after 4 days call [employer] at [phone] for  suggestions on securing your monitor    Follow-Up: At [facility], you and your health needs are our priority.  As part of our continuing mission to provide you with exceptional heart care, we have created designated Provider Care Teams.  These Care Teams include your primary Cardiologist (physician) and Advanced Practice Providers (APPs -  Physician Assistants and Nurse Practitioners) who all work together to provide you with the care you need, when you need it.  We recommend signing up for the patient portal called "MyChart".  Sign up information is provided on this After Visit Summary.  MyChart is used to connect with patients for Virtual Visits (Telemedicine).  Patients are able to view lab/test results, encounter notes, upcoming appointments, etc.  Non-urgent messages can be sent to your provider as well.   To learn more about what you can do with MyChart, go to [email or web address].    Your next appointment:    End of December 2023  The format for your next appointment:   In Person  Provider:           Other Instructions   Important Information About Sugar

## 2022-05-03 ENCOUNTER — Telehealth: Payer: Self-pay

## 2022-05-03 DIAGNOSIS — E875 Hyperkalemia: Secondary | ICD-10-CM

## 2022-05-03 LAB — BASIC METABOLIC PANEL
BUN/Creatinine Ratio: 13 (ref 12–28)
BUN: 14 mg/dL (ref 8–27)
CO2: 20 mmol/L (ref 20–29)
Calcium: 9 mg/dL (ref 8.7–10.3)
Chloride: 96 mmol/L (ref 96–106)
Creatinine, Ser: 1.09 mg/dL — ABNORMAL HIGH (ref 0.57–1.00)
Glucose: 90 mg/dL (ref 70–99)
Potassium: 5.6 mmol/L — ABNORMAL HIGH (ref 3.5–5.2)
Sodium: 138 mmol/L (ref 134–144)
eGFR: 50 mL/min/{1.73_m2} — ABNORMAL LOW (ref >59)

## 2022-05-03 NOTE — Telephone Encounter (Signed)
Spoke with pt. Pt was notified of lab results and recommendations. Pt is going to come back to our office to repeat lab work. BMET lab order placed.

## 2022-05-06 DIAGNOSIS — Z79899 Other long term (current) drug therapy: Secondary | ICD-10-CM | POA: Diagnosis not present

## 2022-05-06 DIAGNOSIS — R42 Dizziness and giddiness: Secondary | ICD-10-CM

## 2022-05-06 DIAGNOSIS — I5020 Unspecified systolic (congestive) heart failure: Secondary | ICD-10-CM | POA: Diagnosis not present

## 2022-05-06 DIAGNOSIS — I480 Paroxysmal atrial fibrillation: Secondary | ICD-10-CM | POA: Diagnosis not present

## 2022-05-06 DIAGNOSIS — E875 Hyperkalemia: Secondary | ICD-10-CM | POA: Diagnosis not present

## 2022-05-07 LAB — HEPATIC FUNCTION PANEL
ALT: 17 IU/L (ref 0–32)
AST: 23 IU/L (ref 0–40)
Albumin: 3.8 g/dL (ref 3.7–4.7)
Alkaline Phosphatase: 114 IU/L (ref 44–121)
Bilirubin Total: 0.6 mg/dL (ref 0.0–1.2)
Bilirubin, Direct: 0.2 mg/dL (ref 0.00–0.40)
Total Protein: 6.3 g/dL (ref 6.0–8.5)

## 2022-05-07 LAB — LIPID PANEL
Chol/HDL Ratio: 2.5 ratio (ref 0.0–4.4)
Cholesterol, Total: 108 mg/dL (ref 100–199)
HDL: 44 mg/dL (ref >39)
LDL Chol Calc (NIH): 46 mg/dL (ref 0–99)
Triglycerides: 91 mg/dL (ref 0–149)
VLDL Cholesterol Cal: 18 mg/dL (ref 5–40)

## 2022-05-07 LAB — BASIC METABOLIC PANEL
BUN/Creatinine Ratio: 11 — ABNORMAL LOW (ref 12–28)
BUN: 12 mg/dL (ref 8–27)
CO2: 21 mmol/L (ref 20–29)
Calcium: 9.4 mg/dL (ref 8.7–10.3)
Chloride: 96 mmol/L (ref 96–106)
Creatinine, Ser: 1.11 mg/dL — ABNORMAL HIGH (ref 0.57–1.00)
Glucose: 64 mg/dL — ABNORMAL LOW (ref 70–99)
Potassium: 5.5 mmol/L — ABNORMAL HIGH (ref 3.5–5.2)
Sodium: 137 mmol/L (ref 134–144)
eGFR: 49 mL/min/{1.73_m2} — ABNORMAL LOW (ref >59)

## 2022-05-10 ENCOUNTER — Other Ambulatory Visit: Payer: Self-pay

## 2022-05-10 ENCOUNTER — Telehealth: Payer: Self-pay

## 2022-05-10 DIAGNOSIS — E875 Hyperkalemia: Secondary | ICD-10-CM

## 2022-05-10 NOTE — Telephone Encounter (Signed)
Spoke with pt. Pt was notified of lab results and recommendations. Pt will repeat labs in 1 week as directed.  Lab order placed for repeat labs.

## 2022-05-12 ENCOUNTER — Telehealth: Payer: Self-pay

## 2022-05-12 NOTE — Telephone Encounter (Signed)
Called patient, advised of mychart message.   He wanted to discuss recent medication changes via labs/mychart messages to make sure he had them correctly.   Everything was up to date. Patient verbalized thanks for returning call.

## 2022-05-16 ENCOUNTER — Telehealth (HOSPITAL_BASED_OUTPATIENT_CLINIC_OR_DEPARTMENT_OTHER): Payer: Self-pay | Admitting: *Deleted

## 2022-05-16 NOTE — Telephone Encounter (Signed)
Spoke with patient regarding the Echocardiogram ordered by Diona Browner, NP---should be scheduled mid December 2023---new appointment Friday 07/08/22 at 2:00 pm.  Will mail information to patient and she voiced her understanding.

## 2022-05-17 ENCOUNTER — Other Ambulatory Visit (HOSPITAL_COMMUNITY): Payer: Self-pay | Admitting: Physician Assistant

## 2022-05-17 ENCOUNTER — Ambulatory Visit: Payer: Medicare PPO | Admitting: Cardiology

## 2022-05-17 NOTE — Telephone Encounter (Signed)
This is Dr. Schumann's pt 

## 2022-05-19 ENCOUNTER — Other Ambulatory Visit (HOSPITAL_BASED_OUTPATIENT_CLINIC_OR_DEPARTMENT_OTHER): Payer: Medicare PPO

## 2022-05-20 ENCOUNTER — Other Ambulatory Visit (HOSPITAL_BASED_OUTPATIENT_CLINIC_OR_DEPARTMENT_OTHER): Payer: Self-pay

## 2022-05-20 DIAGNOSIS — E875 Hyperkalemia: Secondary | ICD-10-CM | POA: Diagnosis not present

## 2022-05-20 MED ORDER — INFLUENZA VAC A&B SA ADJ QUAD 0.5 ML IM PRSY
PREFILLED_SYRINGE | INTRAMUSCULAR | 0 refills | Status: DC
  Filled 2022-05-20: qty 0.5, 1d supply, fill #0

## 2022-05-20 MED ORDER — COMIRNATY 30 MCG/0.3ML IM SUSY
PREFILLED_SYRINGE | INTRAMUSCULAR | 0 refills | Status: DC
  Filled 2022-05-20: qty 0.3, 1d supply, fill #0

## 2022-05-21 LAB — BASIC METABOLIC PANEL
BUN/Creatinine Ratio: 15 (ref 12–28)
BUN: 16 mg/dL (ref 8–27)
CO2: 19 mmol/L — ABNORMAL LOW (ref 20–29)
Calcium: 9 mg/dL (ref 8.7–10.3)
Chloride: 100 mmol/L (ref 96–106)
Creatinine, Ser: 1.05 mg/dL — ABNORMAL HIGH (ref 0.57–1.00)
Glucose: 117 mg/dL — ABNORMAL HIGH (ref 70–99)
Potassium: 4.4 mmol/L (ref 3.5–5.2)
Sodium: 137 mmol/L (ref 134–144)
eGFR: 53 mL/min/{1.73_m2} — ABNORMAL LOW (ref >59)

## 2022-05-24 ENCOUNTER — Telehealth: Payer: Self-pay

## 2022-05-24 NOTE — Telephone Encounter (Signed)
Spoke with pt. Pt was notified of lab results and recommendations.  ?

## 2022-05-25 ENCOUNTER — Telehealth: Payer: Self-pay | Admitting: Cardiology

## 2022-05-25 DIAGNOSIS — I480 Paroxysmal atrial fibrillation: Secondary | ICD-10-CM | POA: Diagnosis not present

## 2022-05-25 DIAGNOSIS — R42 Dizziness and giddiness: Secondary | ICD-10-CM | POA: Diagnosis not present

## 2022-05-25 NOTE — Telephone Encounter (Signed)
Received call from [place] at [facility] who reported critical monitor results from 05/06/22 to 05/20/22. Left message for patient to call back.

## 2022-05-25 NOTE — Telephone Encounter (Signed)
Calling with critical results for the patient

## 2022-05-25 NOTE — Addendum Note (Signed)
Addended by: Betha Loa F on: 05/25/2022 02:26 PM   Modules accepted: Orders

## 2022-05-25 NOTE — Telephone Encounter (Signed)
Spoke with Doylene Canning at EP regarding ASAP EP referral. Her earliest appointment is 06/09/22 at 10:15, and will place patient on wait list. Doylene Canning said she would call patient regarding appointment.

## 2022-05-25 NOTE — Telephone Encounter (Signed)
Spoke with patient regarding heart monitor. She stated the only symptom she had during the monitor wearing was a brief feeling of lightheadedness one time. Last pm BP 125/75. Advise Dr. Gardiner Rhyme who was DOD. He ordered to stop coreg, refer to EP ASAP and advise patient that if she has presyncope or syncope to cal 911. Patient advised of orders. She verbalized understanding. Recommended to patient that if she feels lightheaded, dizzy, or SOB to call 911. Again, she verbalized understanding. Order for EP placed.

## 2022-05-25 NOTE — Addendum Note (Signed)
Addended by: Betha Loa F on: 05/25/2022 02:28 PM   Modules accepted: Orders

## 2022-05-26 ENCOUNTER — Other Ambulatory Visit: Payer: Self-pay

## 2022-05-26 ENCOUNTER — Inpatient Hospital Stay (HOSPITAL_COMMUNITY)
Admission: EM | Admit: 2022-05-26 | Discharge: 2022-05-28 | DRG: 243 | Disposition: A | Discharge status: Home / Self Care | Payer: Medicare PPO | Attending: Cardiology | Admitting: Cardiology

## 2022-05-26 ENCOUNTER — Encounter (HOSPITAL_COMMUNITY): Payer: Self-pay

## 2022-05-26 ENCOUNTER — Emergency Department (HOSPITAL_COMMUNITY): Payer: Medicare PPO

## 2022-05-26 ENCOUNTER — Encounter: Payer: Self-pay | Admitting: Cardiology

## 2022-05-26 DIAGNOSIS — I5022 Chronic systolic (congestive) heart failure: Secondary | ICD-10-CM | POA: Diagnosis present

## 2022-05-26 DIAGNOSIS — Z8249 Family history of ischemic heart disease and other diseases of the circulatory system: Secondary | ICD-10-CM | POA: Diagnosis not present

## 2022-05-26 DIAGNOSIS — E785 Hyperlipidemia, unspecified: Secondary | ICD-10-CM | POA: Diagnosis present

## 2022-05-26 DIAGNOSIS — Z79890 Hormone replacement therapy (postmenopausal): Secondary | ICD-10-CM | POA: Diagnosis not present

## 2022-05-26 DIAGNOSIS — R059 Cough, unspecified: Secondary | ICD-10-CM | POA: Diagnosis not present

## 2022-05-26 DIAGNOSIS — I252 Old myocardial infarction: Secondary | ICD-10-CM | POA: Diagnosis not present

## 2022-05-26 DIAGNOSIS — Z955 Presence of coronary angioplasty implant and graft: Secondary | ICD-10-CM | POA: Diagnosis not present

## 2022-05-26 DIAGNOSIS — Z882 Allergy status to sulfonamides status: Secondary | ICD-10-CM

## 2022-05-26 DIAGNOSIS — Z7982 Long term (current) use of aspirin: Secondary | ICD-10-CM

## 2022-05-26 DIAGNOSIS — L4050 Arthropathic psoriasis, unspecified: Secondary | ICD-10-CM | POA: Diagnosis not present

## 2022-05-26 DIAGNOSIS — I10 Essential (primary) hypertension: Secondary | ICD-10-CM | POA: Diagnosis present

## 2022-05-26 DIAGNOSIS — Z880 Allergy status to penicillin: Secondary | ICD-10-CM

## 2022-05-26 DIAGNOSIS — I495 Sick sinus syndrome: Principal | ICD-10-CM | POA: Diagnosis present

## 2022-05-26 DIAGNOSIS — I2510 Atherosclerotic heart disease of native coronary artery without angina pectoris: Secondary | ICD-10-CM | POA: Diagnosis present

## 2022-05-26 DIAGNOSIS — Z96653 Presence of artificial knee joint, bilateral: Secondary | ICD-10-CM | POA: Diagnosis present

## 2022-05-26 DIAGNOSIS — I480 Paroxysmal atrial fibrillation: Secondary | ICD-10-CM | POA: Diagnosis present

## 2022-05-26 DIAGNOSIS — Z96612 Presence of left artificial shoulder joint: Secondary | ICD-10-CM | POA: Diagnosis present

## 2022-05-26 DIAGNOSIS — E039 Hypothyroidism, unspecified: Secondary | ICD-10-CM | POA: Diagnosis present

## 2022-05-26 DIAGNOSIS — I455 Other specified heart block: Secondary | ICD-10-CM

## 2022-05-26 DIAGNOSIS — Z79899 Other long term (current) drug therapy: Secondary | ICD-10-CM | POA: Diagnosis not present

## 2022-05-26 DIAGNOSIS — R42 Dizziness and giddiness: Secondary | ICD-10-CM | POA: Diagnosis not present

## 2022-05-26 DIAGNOSIS — E875 Hyperkalemia: Secondary | ICD-10-CM | POA: Diagnosis present

## 2022-05-26 DIAGNOSIS — J9811 Atelectasis: Secondary | ICD-10-CM | POA: Diagnosis not present

## 2022-05-26 DIAGNOSIS — K219 Gastro-esophageal reflux disease without esophagitis: Secondary | ICD-10-CM | POA: Diagnosis present

## 2022-05-26 DIAGNOSIS — M1990 Unspecified osteoarthritis, unspecified site: Secondary | ICD-10-CM | POA: Insufficient documentation

## 2022-05-26 DIAGNOSIS — F32A Depression, unspecified: Secondary | ICD-10-CM | POA: Diagnosis present

## 2022-05-26 DIAGNOSIS — I4510 Unspecified right bundle-branch block: Secondary | ICD-10-CM | POA: Diagnosis present

## 2022-05-26 DIAGNOSIS — Z7901 Long term (current) use of anticoagulants: Secondary | ICD-10-CM

## 2022-05-26 DIAGNOSIS — I4891 Unspecified atrial fibrillation: Secondary | ICD-10-CM | POA: Diagnosis not present

## 2022-05-26 DIAGNOSIS — J90 Pleural effusion, not elsewhere classified: Secondary | ICD-10-CM | POA: Diagnosis not present

## 2022-05-26 DIAGNOSIS — I255 Ischemic cardiomyopathy: Secondary | ICD-10-CM | POA: Diagnosis present

## 2022-05-26 DIAGNOSIS — Z7902 Long term (current) use of antithrombotics/antiplatelets: Secondary | ICD-10-CM | POA: Diagnosis not present

## 2022-05-26 DIAGNOSIS — I110 Hypertensive heart disease with heart failure: Secondary | ICD-10-CM | POA: Diagnosis not present

## 2022-05-26 LAB — APTT: aPTT: 36 seconds (ref 24–36)

## 2022-05-26 LAB — BASIC METABOLIC PANEL
Anion gap: 11 (ref 5–15)
BUN: 14 mg/dL (ref 8–23)
CO2: 21 mmol/L — ABNORMAL LOW (ref 22–32)
Calcium: 9 mg/dL (ref 8.9–10.3)
Chloride: 104 mmol/L (ref 98–111)
Creatinine, Ser: 1.18 mg/dL — ABNORMAL HIGH (ref 0.44–1.00)
GFR, Estimated: 46 mL/min — ABNORMAL LOW (ref >60)
Glucose, Bld: 96 mg/dL (ref 70–99)
Potassium: 4.3 mmol/L (ref 3.5–5.1)
Sodium: 136 mmol/L (ref 135–145)

## 2022-05-26 LAB — CBC
HCT: 38.7 % (ref 36.0–46.0)
Hemoglobin: 12.3 g/dL (ref 12.0–15.0)
MCH: 27.3 pg (ref 26.0–34.0)
MCHC: 31.8 g/dL (ref 30.0–36.0)
MCV: 86 fL (ref 80.0–100.0)
Platelets: 370 10*3/uL (ref 150–400)
RBC: 4.5 MIL/uL (ref 3.87–5.11)
RDW: 15.2 % (ref 11.5–15.5)
WBC: 10.7 10*3/uL — ABNORMAL HIGH (ref 4.0–10.5)
nRBC: 0 % (ref 0.0–0.2)

## 2022-05-26 LAB — TROPONIN I (HIGH SENSITIVITY)
Troponin I (High Sensitivity): 102 ng/L (ref <18)
Troponin I (High Sensitivity): 121 ng/L (ref <18)

## 2022-05-26 LAB — HEPARIN LEVEL (UNFRACTIONATED): Heparin Unfractionated: >1.1 IU/mL — ABNORMAL HIGH (ref 0.30–0.70)

## 2022-05-26 LAB — MAGNESIUM: Magnesium: 2 mg/dL (ref 1.7–2.4)

## 2022-05-26 MED ORDER — AMIODARONE HCL 200 MG PO TABS
200.0000 mg | ORAL_TABLET | Freq: Every day | ORAL | Status: DC
  Administered 2022-05-27 – 2022-05-28 (×2): 200 mg via ORAL
  Filled 2022-05-26 (×3): qty 1

## 2022-05-26 MED ORDER — LOSARTAN POTASSIUM 50 MG PO TABS
50.0000 mg | ORAL_TABLET | Freq: Every day | ORAL | Status: DC
  Administered 2022-05-27 – 2022-05-28 (×2): 50 mg via ORAL
  Filled 2022-05-26 (×3): qty 1

## 2022-05-26 MED ORDER — ONDANSETRON HCL 4 MG/2ML IJ SOLN: 4.0000 mg | Freq: Four times a day (QID) | INTRAMUSCULAR | Status: DC | PRN

## 2022-05-26 MED ORDER — CLOPIDOGREL BISULFATE 75 MG PO TABS
75.0000 mg | ORAL_TABLET | Freq: Every day | ORAL | Status: DC
  Administered 2022-05-27 – 2022-05-28 (×2): 75 mg via ORAL
  Filled 2022-05-26 (×3): qty 1

## 2022-05-26 MED ORDER — ATORVASTATIN CALCIUM 40 MG PO TABS: 80.0000 mg | ORAL_TABLET | Freq: Every day | ORAL | Status: DC

## 2022-05-26 MED ORDER — CLOPIDOGREL BISULFATE 75 MG PO TABS: 75.0000 mg | ORAL_TABLET | Freq: Every day | ORAL | Status: DC

## 2022-05-26 MED ORDER — LOSARTAN POTASSIUM 50 MG PO TABS: 50.0000 mg | ORAL_TABLET | Freq: Every day | ORAL | Status: DC

## 2022-05-26 MED ORDER — SODIUM CHLORIDE 0.9 % IV SOLN: INTRAVENOUS | Status: DC

## 2022-05-26 MED ORDER — ATORVASTATIN CALCIUM 80 MG PO TABS
80.0000 mg | ORAL_TABLET | Freq: Every day | ORAL | Status: DC
  Administered 2022-05-27 – 2022-05-28 (×2): 80 mg via ORAL
  Filled 2022-05-26 (×3): qty 1

## 2022-05-26 MED ORDER — ACETAMINOPHEN 325 MG PO TABS
650.0000 mg | ORAL_TABLET | ORAL | Status: DC | PRN
  Administered 2022-05-27: 650 mg via ORAL
  Filled 2022-05-26: qty 2

## 2022-05-26 MED ORDER — NITROGLYCERIN 0.4 MG SL SUBL: 0.4000 mg | SUBLINGUAL_TABLET | SUBLINGUAL | Status: DC | PRN

## 2022-05-26 MED ORDER — SERTRALINE HCL 100 MG PO TABS
100.0000 mg | ORAL_TABLET | Freq: Every day | ORAL | Status: DC
  Administered 2022-05-27 – 2022-05-28 (×2): 100 mg via ORAL
  Filled 2022-05-26 (×3): qty 1

## 2022-05-26 MED ORDER — EMPAGLIFLOZIN 10 MG PO TABS
10.0000 mg | ORAL_TABLET | Freq: Every day | ORAL | Status: DC
  Administered 2022-05-27 – 2022-05-28 (×2): 10 mg via ORAL
  Filled 2022-05-26 (×4): qty 1

## 2022-05-26 NOTE — ED Notes (Signed)
Patient taken to restroom, and given sandwich to eat. No s/s of any distress. Will continue to monitor

## 2022-05-26 NOTE — ED Provider Triage Note (Addendum)
Emergency Medicine Provider Triage Evaluation Note  Connie [name] , a 81 y.o. female  was evaluated in triage.  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  Patient has had episodes of Afib with RVR and long pauses up to 7 seconds.  She has not experienced syncope, chest pain, or dyspnea with these episodes but does have lightheadedness.  She has had an increase in her episodes with 2 episodes today.  She had an MI with 2 stents placed in Sept 2023.   Review of Systems  Positive: Lightheadedness, tachybrady episodes Negative: Syncope, chest pain, dyspnea  Physical Exam  BP (!) 146/81 (BP Location: Right Arm)   Pulse 83   Temp 98.2 F (36.8 C) (Oral)   Resp 18   Ht 5' (1.524 m)   Wt 73.5 kg   SpO2 95%   BMI 31.64 kg/m  Gen:   Awake, no distress   Resp:  Normal effort, lungs clear MSK:   Moves extremities without difficulty  Other:  Heart  rhythm irregular, normal rate; 2+ radial pulses  Medical Decision Making  Medically screening exam initiated at 3:51 PM.  Appropriate orders placed.  Lasya Vetter was informed that the remainder of the evaluation will be completed by another provider, this initial triage assessment does not replace that evaluation, and the importance of remaining in the ED until their evaluation is complete.     Theressa Stamps R, [place] 05/26/22 1553    Pat Kocher, [place] 05/26/22 [phone]

## 2022-05-26 NOTE — ED Triage Notes (Addendum)
Patient had heart attack and 2 stents in sept and has been on a halter nmonitor and went to cards MD today and told she was having pauses.  Patient reports having some fogginess at times.  Was told to come to ER for admission to have a pacemaker placed. Per note patient is having tachybrady syndrome with up to 7s pauses along with afib RVR they stopped her coreg and started her on ami

## 2022-05-26 NOTE — ED Provider Notes (Signed)
[facility] EMERGENCY DEPARTMENT Provider Note   CSN: [id number] Arrival date & time: 05/26/22  1517     History  Chief Complaint  Patient presents with   Dizziness    Davonna Ertl is a 81 y.o. female with recent history of STEMI s/p, HTN, hypothyroidism, HLD, PAF, status post total left knee arthroplasty, status post total left shoulder replacement, psoriatic arthritis, who presents with dizziness.  Patient reports a recent MI 3-4 weeks ago treated with 2 stents. Since that time over the last few weeks she has had a strange feeling in her head, sort of like dizziness, or fogginess, but it is very difficult to understand. Has happened very frequently, up to every hour over the last few weeks though less over the last few days. Has not lost consciousness though she did fall approximately two weeks ago d/t tripping and fell onto her R side, did hit her head but did not lose consciousness, had no nausea/vomiting afterward. Did not have neck pain. Was not evaluated at that time, was ambulatory after the incident, states she no longer hurts anywhere or has any pain related to that fall. Cardiology had placed her on holter monitor for her symptoms and called her today to tell her she was having sinus pauses up to 7 seconds along with episodes of Afib w/ RVR. They discontinued her coreg, had been started on amio, and told to come to ED to be admitted for pacemaker placement. She currently has no symptoms. Denies ever having SOB, CP, abd pain, N/V/D/C, urinary symptoms, leg swelling.    Dizziness      Home Medications Prior to Admission medications   Medication Sig Start Date End Date Taking? Authorizing Provider  amiodarone (PACERONE) 200 MG tablet Take 2 tablets (400 mg total) by mouth 2 (two) times daily for 4 days, THEN 1 tablet (200 mg total) 2 (two) times daily for 7 days, THEN 1 tablet (200 mg total) daily. Patient not taking: Reported on 05/02/2022 04/14/22 06/24/22   Cheryln Manly, NP  apixaban (ELIQUIS) 5 MG TABS tablet Take 1 tablet (5 mg total) by mouth 2 (two) times daily. 05/02/22   Lenna Sciara, NP  atorvastatin (LIPITOR) 80 MG tablet Take 1 tablet (80 mg total) by mouth daily. 03/30/22 03/25/23  Donato Heinz, MD  clopidogrel (PLAVIX) 75 MG tablet Take 1 tablet (75 mg total) by mouth daily. 04/15/22   Reino Bellis B, NP  COVID-19 mRNA vaccine [phone] (COMIRNATY) syringe Inject into the muscle. 05/20/22   Carlyle Basques, MD  empagliflozin (JARDIANCE) 10 MG TABS tablet Take 1 tablet (10 mg total) by mouth daily. 05/02/22   Lenna Sciara, NP  furosemide (LASIX) 20 MG tablet TAKE 1 TABLET BY MOUTH EVERY MONDAY, WEDNESDAY, FRIDAY 05/17/22   Donato Heinz, MD  influenza vaccine adjuvanted (FLUAD) 0.5 ML injection Inject into the muscle. 05/20/22   Carlyle Basques, MD  levothyroxine (SYNTHROID) 88 MCG tablet Take 88 mcg by mouth daily before breakfast.    [provider]  losartan (COZAAR) 25 MG tablet Take 1 tablet (25 mg total) by mouth daily. 05/02/22   Lenna Sciara, NP  nitroGLYCERIN (NITROSTAT) 0.4 MG SL tablet Place 1 tablet (0.4 mg total) under the tongue every 5 (five) minutes x 3 doses as needed for chest pain. 04/14/22   Cheryln Manly, NP  sertraline (ZOLOFT) 100 MG tablet Take 100 mg by mouth daily.    [provider]  spironolactone (ALDACTONE) 25  MG tablet Take 1/2 tablet (12.5 mg total) by mouth daily. 04/14/22   Cheryln Manly, NP      Allergies    Penicillins and Sulfa antibiotics    Review of Systems   Review of Systems  Neurological:  Positive for dizziness.   Review of systems negative for LOC.  A 10 point review of systems was performed and is negative unless otherwise reported in HPI.  Physical Exam Updated Vital Signs BP (!) 117/58   Pulse 75   Temp 98.2 F (36.8 C)   Resp 17   Ht 5' (1.524 m)   Wt 73.5 kg   SpO2 95%   BMI 31.64 kg/m  Physical Exam General: Normal  appearing female, lying in bed.  HEENT: Sclera anicteric, MMM, trachea midline. [place]/AT. No midline c-spine tenderness. No skull depressions/deformities, no scalp lacerations. Cardiology: RRR, no murmurs/rubs/gallops. BL radial and DP pulses equal bilaterally. No chest TTP, no crepitus or deformity.  Resp: Normal respiratory rate and effort. CTAB, no wheezes, rhonchi, crackles.  Abd: Soft, non-tender, non-distended. No rebound tenderness or guarding.  GU: Deferred. MSK: No peripheral edema or signs of trauma. Extremities without deformity or TTP Skin: warm, dry. No rashes or lesions Neuro: A&Ox4, CNs II-XII grossly intact. MAEs. Sensation grossly intact.  Psych: Normal mood and affect.   ED Results / Procedures / Treatments   Labs (all labs ordered are listed, but only abnormal results are displayed) Labs Reviewed  BASIC METABOLIC PANEL - Abnormal; Notable for the following components:      Result Value   CO2 21 (*)    Creatinine, Ser 1.18 (*)    GFR, Estimated 46 (*)    All other components within normal limits  CBC - Abnormal; Notable for the following components:   WBC 10.7 (*)    All other components within normal limits  HEPARIN LEVEL (UNFRACTIONATED) - Abnormal; Notable for the following components:   Heparin Unfractionated >1.10 (*)    All other components within normal limits  TROPONIN I (HIGH SENSITIVITY) - Abnormal; Notable for the following components:   Troponin I (High Sensitivity) 102 (*)    All other components within normal limits  TROPONIN I (HIGH SENSITIVITY) - Abnormal; Notable for the following components:   Troponin I (High Sensitivity) 121 (*)    All other components within normal limits  MAGNESIUM  APTT  BASIC METABOLIC PANEL  CBC WITH DIFFERENTIAL/PLATELET    EKG EKG Interpretation  Date/Time:  Thursday May 26 2022 19:03:15 EDT Ventricular Rate:  75 PR Interval:  156 QRS Duration: 128 QT Interval:  425 QTC Calculation: 475 R Axis:   -83 Text  Interpretation: Sinus rhythm Right bundle branch block Inferior infarct, old Probable anteroseptal infarct, recent Simiar to prior Confirmed by Cindee Lame [phone]) on 05/26/2022 7:30:06 PM  Radiology DG Chest 2 View  Result Date: 05/26/2022 CLINICAL DATA:  Tachybrady syndrome.  Cough and dizziness. EXAM: CHEST - 2 VIEW COMPARISON:  Chest radiographs 04/21/2022 FINDINGS: The cardiac silhouette is upper limits of normal in size. Small bilateral pleural effusions have slightly decreased in size. No airspace consolidation, edema, or pneumothorax is identified. A left shoulder arthroplasty is noted. IMPRESSION: Slightly decreased size of small bilateral pleural effusions. Electronically Signed   By: Logan Bores M.D.   On: 05/26/2022 16:20   LONG TERM MONITOR (3-14 DAYS)  Result Date: 05/26/2022   224 pauses, longest lasting 6.8 seconds   12% Afib burden with average rate 105 bpm Patch Wear Time:  13  days and 21 hours (2023-10-13T10:48:39-0400 to 2023-10-27T08:32:02-399) Patient had a min HR of 35 bpm, max HR of 149 bpm, and avg HR of 74 bpm. Predominant underlying rhythm was Sinus Rhythm. Intermittent Bundle Branch Block was present. Atrial Fibrillation/Flutter occurred (12% burden), ranging from 57-149 bpm (avg of 105 bpm), the longest lasting 1 hour 6 mins with an avg rate of 111 bpm. 224 Pauses occurred, the longest lasting 6.8 secs (9 bpm). Atrial Fibrillation/Flutter and Pause were detected within +/- 45 seconds of symptomatic patient event(s). Isolated SVEs were rare (<1.0%), SVE Couplets were rare (<1.0%), and SVE Triplets were rare (<1.0%). Isolated VEs were rare (<1.0%), and no VE Couplets or VE Triplets were present. Inverted QRS complexes possibly due to inverted placement of device. MD notification criteria for Pauses met - report posted prior to notification per account request (MS).    Procedures Procedures    Medications Ordered in ED Medications  amiodarone (PACERONE) tablet 200 mg  (has no administration in time range)  nitroGLYCERIN (NITROSTAT) SL tablet 0.4 mg (has no administration in time range)  sertraline (ZOLOFT) tablet 100 mg (has no administration in time range)  empagliflozin (JARDIANCE) tablet 10 mg (has no administration in time range)  acetaminophen (TYLENOL) tablet 650 mg (has no administration in time range)  ondansetron (ZOFRAN) injection 4 mg (has no administration in time range)  clopidogrel (PLAVIX) tablet 75 mg (has no administration in time range)  0.9 %  sodium chloride infusion (has no administration in time range)  atorvastatin (LIPITOR) tablet 80 mg (has no administration in time range)  losartan (COZAAR) tablet 50 mg (has no administration in time range)    ED Course/ Medical Decision Making/ A&P                          Medical Decision Making Amount and/or Complexity of Data Reviewed Labs:  Decision-making details documented in ED Course. Radiology:  Decision-making details documented in ED Course.  Risk Decision regarding hospitalization.   Patient is well-appearing and vitally stable.  She does report a fall and was not evaluated at that time but has no further pain, had no LOC,  no obvious signs of injury on exam to suggest fracture or other traumatic injury, is ambulatory, and no FNDs to suggest a ICH. States the fall was unrelated to her other symptoms.  Patient reported dizziness/fogginess/lightheadedness intermittently and holter monitor demonstrated Afib w/ RVR w/ sinus pauses. She has had no symptoms since arriving in the ED, and EKG demonstrates RBBB with small elevations in anterior leads similar to prior EKGs, she has no current CP/nausea/SOB to suggest ischemia but will obtain troponins given recent STEMI/stent placement. She has no SOB/crackles/LEE to suggest acute heart failure.  Consider electrolyte abnormalities, anemia, dehydration.  With sinus pauses demonstrated on Holter monitor patient will need to be consulted and  admitted to cardiology for pacemaker placement.  Labs significant for creatinine 1.18 which is at baseline, normal sodium/potassium/magnesium.  Troponin elevated to 102 --> 121.  Chest x-ray demonstrates decreased size of small bilateral pleural effusions.  I have personally reviewed and interpreted all labs and imaging.   Clinical Course as of 05/26/22 2317  Thu May 26, 2022  1903 Troponin I (High Sensitivity)(!!): 121 Trop 102 --> 121 [HN]  1930 DG Chest 2 View Slightly decreased size of small bilateral pleural effusions. [HN]  1930 EKG similar to prior on 04/20/22 [HN]  1930 Consulted to cardiology [HN]    Clinical Course  User Index [HN] Audley Hose, MD    Patient consulted to and admitted to cardiology.        Final Clinical Impression(s) / ED Diagnoses Final diagnoses:  Dizziness  Sinus pause    Rx / DC Orders ED Discharge Orders     None        This note was created using dictation software, which may contain spelling or grammatical errors.    Audley Hose, MD 05/26/22 [phone]

## 2022-05-26 NOTE — Progress Notes (Deleted)
ANTICOAGULATION CONSULT NOTE - Initial Consult  Pharmacy Consult for heparin Indication: atrial fibrillation  Allergies  Allergen Reactions   Penicillins Rash and Other (See Comments)    Has patient had a PCN reaction causing immediate rash, facial/tongue/throat swelling, SOB or lightheadedness with hypotension: Yes Has patient had a PCN reaction causing severe rash involving mucus membranes or skin necrosis: No Has patient had a PCN reaction that required hospitalization: No Has patient had a PCN reaction occurring within the last 10 years: No If all of the above answers are "NO", then may proceed with Cephalosporin use.    Sulfa Antibiotics Rash   Patient Measurements: Height: 5' (152.4 cm) Weight: 73.5 kg (162 lb) IBW/kg (Calculated) : 45.5 Heparin Dosing Weight: 61.9 kg  Vital Signs: Temp: 98.1 F (36.7 C) (11/02 1735) Temp Source: Oral (11/02 1540) BP: 153/72 (11/02 2015) Pulse Rate: 72 (11/02 2015)  Labs: Recent Labs    05/26/22 1547 05/26/22 1755  HGB 12.3  --   HCT 38.7  --   PLT 370  --   CREATININE 1.18*  --   TROPONINIHS 102* 121*   Estimated Creatinine Clearance: 32.3 mL/min (A) (by C-G formula based on SCr of 1.18 mg/dL (H)).  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 On Eliquis 5 mg BID PTA,  last dose was 11/2 at 0900. Cardiology is tentatively planning for placement of a pacemaker on 11/3. Pharmacy has been consulted to dose heparin. Due to recent use of apixaban, will monitor heparin and aPTT levels until correlating.   Hgb 12.3, platelets are WNL. Patient denies any recent signs or symptoms of bleeding. Last dose of apixaban was over 12 hours ago - will check baseline heparin and aPTT levels and start heparin infusion.  Goal of Therapy:  Heparin level 0.3-0.7 units/ml aPTT 66-102 seconds Monitor platelets by anticoagulation protocol: Yes   Plan:  Obtain baseline heparin and aPTT levels Start heparin infusion at 1100 units/hour Check 8-hour heparin and apTT levels Monitor daily CBC and signs/symptoms of bleeding  Louanne Belton, PharmD, [facility] PGY1 Pharmacy Resident 05/26/2022 8:58 PM

## 2022-05-26 NOTE — ED Notes (Signed)
Patient resting, watching tv and talking with husband at bedside. No s/s of any distress. No verbal c/o pain or discomfort. Will continue to monitor

## 2022-05-26 NOTE — H&P (Addendum)
Cardiology Admission History and Physical   Patient ID: Connie [name] MRN: [id number]; DOB: 09/21/1940   Admission date: 05/26/2022  PCP:  Glen[facility] Smoker, [facility] Cardiologist:  Donato Heinz, MD        Chief Complaint:  Connie [name]  Patient Profile:   Connie [name] is a 81 y.o. female with CAD, chronic systolic heart failure, ischemic cardiomyopathy, PAF, hypertension, hyperlipidemia and hypothyroidism who is being seen 05/26/2022 for the evaluation of wooziness and prolonged pauses seen on heart monitor.  History of Present Illness:   Connie [name] is a pleasant 81 year old female with past medical history of CAD, chronic systolic heart failure, ischemic cardiomyopathy, PAF, hypertension, hyperlipidemia and hypothyroidism.  Patient was referred to Dr. Gardiner Rhyme in August 2020 in the setting of palpitation.  A 7-day Zio patch monitor in September 2022 showed 2% atrial flutter burden.  Echocardiogram in September 2022 showed normal EF, no significant valve disease.  Calcium score in September 2022 was 604, which placed the patient on 81st percentile for age and sex matched control.  Myoview in January 2023 showed no evidence of ischemia, EF 79%.  Unfortunately she was admitted with anterior STEMI on 04/07/2022.  Cardiac catheterization showed thrombotic occlusion of mid LAD treated with drug-eluting stent.  He also underwent staged PCI DES to RCA on 04/11/2022.  Echocardiogram obtained on 04/07/2022 showed EF 35 to 40%, akinesis of the LV, entire anterior wall, anteroseptal wall, apical segment and the inferior segment, no LV apical thrombus.  Hospital course complicated by atrial fibrillation with RVR with postconversion pauses up to 7 seconds in duration.  She was started on amiodarone in order to suppress A-fib.  EP was consulted.  Pacemaker was deferred.  Patient was discharged on triple therapy including Eliquis, Plavix and aspirin.  Aspirin was  later discontinued after 1 month.  Most recently, spironolactone was also discontinued due to hyperkalemia.  She was placed on a heart monitor, heart monitor captured episodes of A-fib with RVR and the followed by long pauses up to 7 seconds.  The case was reviewed by Dr. 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  She was instructed to stop carvedilol yesterday.  Her last dose of carvedilol was yesterday morning.  The last dose of Eliquis was this morning.  Patient finally was referred to ED on 05/26/2022.  She denies any presyncope or syncope, however describe occasional episodes of wooziness which she described as a net covering her head.  Symptoms only last for a few seconds before going away.  While in the ED, she did have 2 episode of brief pauses less than 2 seconds.  No A-fib was noted during emergency room stay.  She denies any recent chest pain.  Serial troponin obtained in the emergency room was 102-->121.  She denies any shortness of breath.  She has no recent fever or chill.  White blood cell count was borderline elevated at 10.7.     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   Multiple, noted on [place]   History of hiatal hernia    Hypertension    Hypothyroidism    Macular degeneration    Psoriatic arthritis ([place])    Sinus drainage    UTI (urinary tract infection)     Past Surgical History:  Procedure  Laterality Date   BACK SURGERY  2000   Dr Sherwood Gambler   bladder tack  2005   COLONOSCOPY     CORONARY STENT INTERVENTION N/A 04/11/2022   Procedure: CORONARY STENT INTERVENTION;  Surgeon: Sherren Mocha, MD;  Location: [facility];  Service: Cardiovascular;  Laterality: N/A;   DILATION AND CURETTAGE OF UTERUS     after miscarriage   LEFT HEART CATH AND CORONARY ANGIOGRAPHY N/A 04/07/2022   Procedure: LEFT HEART CATH AND  CORONARY ANGIOGRAPHY;  Surgeon: Sherren Mocha, MD;  Location: [facility];  Service: Cardiovascular;  Laterality: N/A;   RECTAL PROLAPSE REPAIR  2008   SCAR REVISION Right 2017   Knee, Dr. Berenice Primas   TONSILLECTOMY     as a child   TOTAL KNEE ARTHROPLASTY Right 09/05/2014   Procedure: TOTAL KNEE ARTHROPLASTY;  Surgeon: Alta Corning, MD;  Location: [facility];  Service: Orthopedics;  Laterality: Right;   TOTAL KNEE ARTHROPLASTY Left 01/10/2022   Procedure: TOTAL KNEE ARTHROPLASTY;  Surgeon: Dorna Leitz, MD;  Location: [facility];  Service: Orthopedics;  Laterality: Left;   TOTAL SHOULDER ARTHROPLASTY Left 11/17/2017   Procedure: LEFT TOTAL SHOULDER ARTHROPLASTY;  Surgeon: Dorna Leitz, MD;  Location: [facility];  Service: Orthopedics;  Laterality: Left;  with block   TUBAL LIGATION  1972     Medications Prior to Admission: Prior to Admission medications   Medication Sig Start Date End Date Taking? Authorizing Provider  amiodarone (PACERONE) 200 MG tablet Take 2 tablets (400 mg total) by mouth 2 (two) times daily for 4 days, THEN 1 tablet (200 mg total) 2 (two) times daily for 7 days, THEN 1 tablet (200 mg total) daily. Patient not taking: Reported on 05/02/2022 04/14/22 06/24/22  Cheryln Manly, NP  apixaban (ELIQUIS) 5 MG TABS tablet Take 1 tablet (5 mg total) by mouth 2 (two) times daily. 05/02/22   Lenna Sciara, NP  aspirin EC 81 MG tablet Take 1 tablet (81 mg total) by mouth daily. Swallow whole. 04/15/22   Cheryln Manly, NP  atorvastatin (LIPITOR) 80 MG tablet Take 1 tablet (80 mg total) by mouth daily. 03/30/22 03/25/23  Donato Heinz, MD  clopidogrel (PLAVIX) 75 MG tablet Take 1 tablet (75 mg total) by mouth daily. 04/15/22   Reino Bellis B, NP  COVID-19 mRNA vaccine [phone] (COMIRNATY) syringe Inject into the muscle. 05/20/22   Carlyle Basques, MD  empagliflozin (JARDIANCE) 10 MG TABS tablet Take 1 tablet (10 mg total) by mouth daily. 05/02/22   Lenna Sciara, NP   furosemide (LASIX) 20 MG tablet TAKE 1 TABLET BY MOUTH EVERY MONDAY, WEDNESDAY, FRIDAY 05/17/22   Donato Heinz, MD  influenza vaccine adjuvanted (FLUAD) 0.5 ML injection Inject into the muscle. 05/20/22   Carlyle Basques, MD  levothyroxine (SYNTHROID) 88 MCG tablet Take 88 mcg by mouth daily before breakfast.    [provider]  losartan (COZAAR) 25 MG tablet Take 1 tablet (25 mg total) by mouth daily. 05/02/22   Lenna Sciara, NP  nitroGLYCERIN (NITROSTAT) 0.4 MG SL tablet Place 1 tablet (0.4 mg total) under the tongue every 5 (five) minutes x 3 doses as needed for chest pain. 04/14/22   Cheryln Manly, NP  sertraline (ZOLOFT) 100 MG tablet Take 100 mg by mouth daily.    [provider]  spironolactone (ALDACTONE) 25 MG tablet Take 1/2 tablet (12.5 mg total) by mouth daily. 04/14/22   Cheryln Manly, NP     Allergies:  Allergies  Allergen Reactions   Penicillins Rash and Other (See Comments)    Has patient had a PCN reaction causing immediate rash, facial/tongue/throat swelling, SOB or lightheadedness with hypotension: Yes Has patient had a PCN reaction causing severe rash involving mucus membranes or skin necrosis: No Has patient had a PCN reaction that required hospitalization: No Has patient had a PCN reaction occurring within the last 10 years: No If all of the above answers are "NO", then may proceed with Cephalosporin use.    Sulfa Antibiotics Rash    Social History:   Social History   Socioeconomic History   Marital status: Married    Spouse name: Barnabas Lister   Number of children: 2   Years of education: Not on file   Highest education level: Associate degree: academic program  Occupational History   Occupation: Retired  Tobacco Use   Smoking status: Never   Smokeless tobacco: Never  Vaping Use   Vaping Use: Never used  Substance and Sexual Activity   Alcohol use: Yes    Comment: rarely wine   Drug use: No   Sexual activity: Not on  file  Other Topics Concern   Not on file  Social History Narrative   Not on file   Social Determinants of Health   Financial Resource Strain: Low Risk  (04/11/2022)   Overall Financial Resource Strain (CARDIA)    Difficulty of Paying Living Expenses: Not hard at all  Food Insecurity: No Food Insecurity (04/11/2022)   Hunger Vital Sign    Worried About Running Out of Food in the Last Year: Never true    [place] in the Last Year: Never true  Transportation Needs: No Transportation Needs (04/11/2022)   PRAPARE - Hydrologist (Medical): No    Lack of Transportation (Non-Medical): No  Physical Activity: Not on file  Stress: Not on file  Social Connections: Not on file  Intimate Partner Violence: Not on file    Family History:   The patient's family history is negative for Breast cancer.    ROS:  Please see the history of present illness.  All other ROS reviewed and negative.     Physical Exam/Data:   Vitals:   05/26/22 1735 05/26/22 1845 05/26/22 1945 05/26/22 2000  BP: (!) 155/75 (!) 145/77 (!) 151/75 (!) 156/83  Pulse: 79 76 74 76  Resp: 17 19 16    Temp: 98.1 F (36.7 C)     TempSrc:      SpO2: 96% 99% 100% 100%  Weight:      Height:       No intake or output data in the 24 hours ending 05/26/22 2024    05/26/2022    3:42 PM 05/02/2022    2:30 PM 04/20/2022    9:05 AM  Last 3 Weights  Weight (lbs) 162 lb 162 lb 9.6 oz 170 lb 9.6 oz  Weight (kg) 73.483 kg 73.755 kg 77.384 kg     Body mass index is 31.64 kg/m.  General:  Well nourished, well developed, in no acute distress HEENT: normal Neck: no JVD Vascular: No carotid bruits; Distal pulses 2+ bilaterally   Cardiac:  normal S1, S2; RRR; no murmur  Lungs:  clear to auscultation bilaterally, no wheezing, rhonchi or rales  Abd: soft, nontender, no hepatomegaly  Ext: no edema Musculoskeletal:  No deformities, BUE and BLE strength normal and equal Skin: warm and dry  Neuro:  CNs  2-12 intact, no focal  abnormalities noted Psych:  Normal affect    EKG:  The ECG that was done and was personally reviewed and demonstrates sinus rhythm, right bundle branch block, Q waves in the inferior and anterior leads.  Minimal J-point in the anterior lead unchanged when compared to the previous EKG from September  Relevant CV Studies:  Echo 04/07/2022  1. No apical thrombus with Definity contrast. Left ventricular ejection  fraction, by estimation, is 35 to 40%. The left ventricle has moderately  decreased function. The left ventricle demonstrates regional wall motion  abnormalities (see scoring  diagram/findings for description). Left ventricular diastolic parameters  are consistent with Grade I diastolic dysfunction (impaired relaxation).  There is severe akinesis of the left ventricular, entire anterior wall,  anteroseptal wall, apical segment  and inferoapical segment. 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 The basal inferior and lateral walls are hyperdynamic.   2. Right ventricular systolic function is mildly reduced. The right  ventricular size is normal. Tricuspid regurgitation signal is inadequate  for assessing PA pressure.   3. The mitral valve is abnormal. Trivial mitral valve regurgitation.   4. The aortic valve is tricuspid. Aortic valve regurgitation is not  visualized.   5. The inferior vena cava is normal in size with greater than 50%  respiratory variability, suggesting right atrial pressure of 3 mmHg.   Comparison(s): Changes from prior study are noted. 04/07/2021: LVEF 55-60%,  normal wall motion.   Laboratory Data:  High Sensitivity Troponin:   Recent Labs  Lab 05/26/22 1547 05/26/22 1755  TROPONINIHS 102* 121*      Chemistry Recent Labs  Lab 05/20/22 0959 05/26/22 1547  NA 137 136  K 4.4 4.3  CL 100 104  CO2 19* 21*  GLUCOSE 117* 96  BUN 16 14  CREATININE 1.05* 1.18*  CALCIUM 9.0 9.0  MG  --  2.0  GFRNONAA  --  46*   ANIONGAP  --  11    No results for input(s): "PROT", "ALBUMIN", "AST", "ALT", "ALKPHOS", "BILITOT" in the last 168 hours. Lipids No results for input(s): "CHOL", "TRIG", "HDL", "LABVLDL", "LDLCALC", "CHOLHDL" in the last 168 hours. Hematology Recent Labs  Lab 05/26/22 1547  WBC 10.7*  RBC 4.50  HGB 12.3  HCT 38.7  MCV 86.0  MCH 27.3  MCHC 31.8  RDW 15.2  PLT 370   Thyroid No results for input(s): "TSH", "FREET4" in the last 168 hours. BNPNo results for input(s): "BNP", "PROBNP" in the last 168 hours.  DDimer No results for input(s): "DDIMER" in the last 168 hours.   Radiology/Studies:  DG Chest 2 View  Result Date: 05/26/2022 CLINICAL DATA:  Tachybrady syndrome.  Cough and dizziness. EXAM: CHEST - 2 VIEW COMPARISON:  Chest radiographs 04/21/2022 FINDINGS: The cardiac silhouette is upper limits of normal in size. Small bilateral pleural effusions have slightly decreased in size. No airspace consolidation, edema, or pneumothorax is identified. A left shoulder arthroplasty is noted. IMPRESSION: Slightly decreased size of small bilateral pleural effusions. Electronically Signed   By: Logan Bores M.D.   On: 05/26/2022 16:20     Assessment and Plan:   Tachy-brady syndrome  -Recent pacemaker revealed episodes of atrial fibrillation with RVR followed by prolonged pauses up to 7 seconds.  -Carvedilol has been discontinued yesterday at the recommendation of Dr. Gardiner Rhyme.  Patient was continued on amiodarone in the hope to help suppress A-fib.  -EP service to see the patient tomorrow morning to consider pacemaker implantation.  -Continue Plavix, hold Eliquis, switch  to IV heparin overnight in anticipation of possible pacemaker tomorrow  Elevated troponin: Denies any recent chest pain.  Troponin was greater than 24,000 a month ago during anterior STEMI.  We will order repeat troponin in the morning.  EKG showed sinus rhythm with right bundle branch block, Q waves in the inferior lead and  anterior lead.  Minimal J-point elevation in anterior lead similar to previous EKG in September.  Paroxysmal atrial fibrillation with RVR: On Eliquis.  Maintaining sinus rhythm during ED stay.  Carvedilol discontinued yesterday  CAD: Denies any recent chest pain.  Had a anterior STEMI in September and underwent DES to mid LAD and RCA.  Chronic systolic heart failure/ischemic cardiomyopathy: On losartan at home.  Spironolactone discontinued due to hyperkalemia.  Carvedilol discontinued it due to prolonged pauses, will consider restarting carvedilol in the future once she received a pacemaker.  Hypertension: Blood pressure elevated in the emergency room. Increase losartan to 50mg  daily  Hyperlipidemia    Risk Assessment/Risk Scores:    TIMI Risk Score for Unstable Angina or Non-ST Elevation MI:   The patient's TIMI risk score is 4, which indicates a 20% risk of all cause mortality, new or recurrent myocardial infarction or need for urgent revascularization in the next 14 days.    CHA2DS2-VASc Score = 5   This indicates a 7.2% annual risk of stroke. The patient's score is based upon: CHF History: 0 HTN History: 1 Diabetes History: 0 Stroke History: 0 Vascular Disease History: 1 Age Score: 2 Gender Score: 1      Severity of Illness: The appropriate patient status for this patient is INPATIENT. Inpatient status is judged to be reasonable and necessary in order to provide the required intensity of service to ensure the patient's safety. The patient's presenting symptoms, physical exam findings, and initial radiographic and laboratory data in the context of their chronic comorbidities is felt to place them at high risk for further clinical deterioration. Furthermore, it is not anticipated that the patient will be medically stable for discharge from the hospital within 2 midnights of admission.   * I certify that at the point of admission it is my clinical judgment that the patient will  require inpatient hospital care spanning beyond 2 midnights from the point of admission due to high intensity of service, high risk for further deterioration and high frequency of surveillance required.*   For questions or updates, please contact [facility] Please consult [email or web address] for contact info under     Hilbert Corrigan, [place]  05/26/2022 8:24 PM   History and all data above reviewed.  Patient examined.  I agree with the findings as above.  She has atrial fib with seven second post termination pauses.  She has been on a low dose beta blocker.  She saw EP in the hospital and she was due to see Dr. Lovena Le as an out patient.  She has not had syncope but she has had some lightheaded episodes.  In the ED her enzymes are mildly elevated.  EKG shows acute ST elevation but this is not different than previous.  EKG.  At home she has been doing some walking without chest pain or SOB.  She does feel the atrial fib when she is in it. The patient exam reveals COR:RRR, no murmurs  ,  Lungs: Clear  ,  Abd: Positive bowel sounds, no rebound no guarding, Ext No edema.   .  All available labs, radiology testing, previous records reviewed. Agree with documented  assessment and plan. Tachybrady:  Avoid beta blocker for now.  Plan EP consult in the AM.   Possible PPM.  In anticipation will hold Eliquis tonight.  Keep NPO until EP sees her.   Jeneen Rinks Caydance Kuehnle  8:47 PM  05/26/2022

## 2022-05-26 NOTE — Telephone Encounter (Signed)
Spoke with patient and her husband.  She is having tachybrady syndrome, will have episodes of Afib with RVR but then have long pauses up to 7 seconds.  She will need a pacemaker.  Discontinued her coreg.  She has also been started on amio, which hopefully will keep her in sinus rhythm and prevent these postconversion pauses.  Discussed going to the ED for admission for pacemaker but patient declines at this time, as she has been minimally symptomatic (reports brief episodes of lightheadedness but none over last few days) and would prefer to f/u with EP as an outpatient.  She is scheduled to see Dr Lovena Le on 11/16.  Advised that if symptoms worsen (having any presyncope or syncope episodes or worsening episodes of lightheadedness), then needs to go to the ED.  She does not drive.  Patient and husband are agreeable to this plan.

## 2022-05-26 NOTE — Telephone Encounter (Signed)
Dr. Schumann aware.

## 2022-05-27 ENCOUNTER — Inpatient Hospital Stay (HOSPITAL_COMMUNITY): Payer: Medicare PPO

## 2022-05-27 ENCOUNTER — Encounter (HOSPITAL_COMMUNITY)
Admission: EM | Disposition: A | Discharge status: Home / Self Care | Payer: Self-pay | Source: Home / Self Care | Attending: Cardiology

## 2022-05-27 DIAGNOSIS — I4891 Unspecified atrial fibrillation: Secondary | ICD-10-CM | POA: Diagnosis not present

## 2022-05-27 DIAGNOSIS — I495 Sick sinus syndrome: Secondary | ICD-10-CM | POA: Diagnosis not present

## 2022-05-27 DIAGNOSIS — I255 Ischemic cardiomyopathy: Secondary | ICD-10-CM

## 2022-05-27 HISTORY — PX: PACEMAKER IMPLANT: EP1218

## 2022-05-27 LAB — ECHOCARDIOGRAM COMPLETE
AR max vel: 2.6 cm2
AV Area VTI: 2.58 cm2
AV Area mean vel: 2.46 cm2
AV Mean grad: 3 mmHg
AV Peak grad: 5.7 mmHg
Ao pk vel: 1.19 m/s
Area-P 1/2: 5.54 cm2
Height: 60 in
MV M vel: 3.56 m/s
MV Peak grad: 50.6 mmHg
S' Lateral: 3.8 cm
Single Plane A4C EF: 36.9 %
Weight: 2592 oz

## 2022-05-27 LAB — BASIC METABOLIC PANEL
Anion gap: 10 (ref 5–15)
BUN: 14 mg/dL (ref 8–23)
CO2: 23 mmol/L (ref 22–32)
Calcium: 8.9 mg/dL (ref 8.9–10.3)
Chloride: 105 mmol/L (ref 98–111)
Creatinine, Ser: 1.17 mg/dL — ABNORMAL HIGH (ref 0.44–1.00)
GFR, Estimated: 46 mL/min — ABNORMAL LOW (ref >60)
Glucose, Bld: 86 mg/dL (ref 70–99)
Potassium: 4.1 mmol/L (ref 3.5–5.1)
Sodium: 138 mmol/L (ref 135–145)

## 2022-05-27 LAB — CBC WITH DIFFERENTIAL/PLATELET
Abs Immature Granulocytes: 0.06 10*3/uL (ref 0.00–0.07)
Basophils Absolute: 0.1 10*3/uL (ref 0.0–0.1)
Basophils Relative: 1 %
Eosinophils Absolute: 0.2 10*3/uL (ref 0.0–0.5)
Eosinophils Relative: 2 %
HCT: 36 % (ref 36.0–46.0)
Hemoglobin: 11.3 g/dL — ABNORMAL LOW (ref 12.0–15.0)
Immature Granulocytes: 1 %
Lymphocytes Relative: 10 %
Lymphs Abs: 1 10*3/uL (ref 0.7–4.0)
MCH: 27.1 pg (ref 26.0–34.0)
MCHC: 31.4 g/dL (ref 30.0–36.0)
MCV: 86.3 fL (ref 80.0–100.0)
Monocytes Absolute: 1.4 10*3/uL — ABNORMAL HIGH (ref 0.1–1.0)
Monocytes Relative: 13 %
Neutro Abs: 7.7 10*3/uL (ref 1.7–7.7)
Neutrophils Relative %: 73 %
Platelets: 317 10*3/uL (ref 150–400)
RBC: 4.17 MIL/uL (ref 3.87–5.11)
RDW: 15.2 % (ref 11.5–15.5)
WBC: 10.4 10*3/uL (ref 4.0–10.5)
nRBC: 0 % (ref 0.0–0.2)

## 2022-05-27 LAB — SURGICAL PCR SCREEN
MRSA, PCR: NEGATIVE
Staphylococcus aureus: NEGATIVE

## 2022-05-27 LAB — TROPONIN I (HIGH SENSITIVITY): Troponin I (High Sensitivity): 124 ng/L (ref <18)

## 2022-05-27 SURGERY — PACEMAKER IMPLANT

## 2022-05-27 MED ORDER — CHLORHEXIDINE GLUCONATE 4 % EX LIQD: 60.0000 mL | Freq: Once | CUTANEOUS | Status: DC

## 2022-05-27 MED ORDER — LEVOTHYROXINE SODIUM 88 MCG PO TABS
88.0000 ug | ORAL_TABLET | Freq: Every day | ORAL | Status: DC
  Filled 2022-05-27: qty 1

## 2022-05-27 MED ORDER — MIDAZOLAM HCL 5 MG/5ML IJ SOLN
INTRAMUSCULAR | Status: AC
  Filled 2022-05-27: qty 5

## 2022-05-27 MED ORDER — FENTANYL CITRATE (PF) 100 MCG/2ML IJ SOLN
INTRAMUSCULAR | Status: AC
  Filled 2022-05-27: qty 2

## 2022-05-27 MED ORDER — FENTANYL CITRATE (PF) 100 MCG/2ML IJ SOLN
INTRAMUSCULAR | Status: DC | PRN
  Administered 2022-05-27: 50 ug via INTRAVENOUS

## 2022-05-27 MED ORDER — SODIUM CHLORIDE 0.9 % IV SOLN: INTRAVENOUS | Status: DC

## 2022-05-27 MED ORDER — SODIUM CHLORIDE 0.9 % IV SOLN
80.0000 mg | INTRAVENOUS | Status: AC
  Administered 2022-05-27: 80 mg

## 2022-05-27 MED ORDER — VANCOMYCIN HCL IN DEXTROSE 1-5 GM/200ML-% IV SOLN
1000.0000 mg | INTRAVENOUS | Status: AC
  Administered 2022-05-27: 1000 mg via INTRAVENOUS

## 2022-05-27 MED ORDER — VANCOMYCIN HCL IN DEXTROSE 1-5 GM/200ML-% IV SOLN: 1000.0000 mg | Freq: Two times a day (BID) | INTRAVENOUS | Status: DC

## 2022-05-27 MED ORDER — LIDOCAINE HCL (PF) 1 % IJ SOLN
INTRAMUSCULAR | Status: DC | PRN
  Administered 2022-05-27: 60 mL

## 2022-05-27 MED ORDER — VANCOMYCIN HCL IN DEXTROSE 1-5 GM/200ML-% IV SOLN
INTRAVENOUS | Status: AC
  Filled 2022-05-27: qty 200

## 2022-05-27 MED ORDER — SODIUM CHLORIDE 0.9 % IV SOLN
INTRAVENOUS | Status: AC
  Filled 2022-05-27: qty 2

## 2022-05-27 MED ORDER — HEPARIN (PORCINE) IN NACL 1000-0.9 UT/500ML-% IV SOLN
INTRAVENOUS | Status: DC | PRN
  Administered 2022-05-27: 500 mL

## 2022-05-27 MED ORDER — LIDOCAINE HCL 1 % IJ SOLN
INTRAMUSCULAR | Status: AC
  Filled 2022-05-27: qty 60

## 2022-05-27 MED ORDER — MIDAZOLAM HCL 5 MG/5ML IJ SOLN
INTRAMUSCULAR | Status: DC | PRN
  Administered 2022-05-27: 1 mg via INTRAVENOUS

## 2022-05-27 MED ORDER — HEPARIN (PORCINE) IN NACL 1000-0.9 UT/500ML-% IV SOLN
INTRAVENOUS | Status: AC
  Filled 2022-05-27: qty 500

## 2022-05-27 SURGICAL SUPPLY — 14 items
CABLE SURGICAL S-101-97-12 (CABLE) ×1 IMPLANT
CATH RIGHTSITE C315HIS02 (CATHETERS) IMPLANT
IPG PACE AZUR XT DR MRI W1DR01 (Pacemaker) IMPLANT
KIT MICROPUNCTURE NIT STIFF (SHEATH) IMPLANT
LEAD CAPSURE NOVUS 5076-52CM (Lead) IMPLANT
LEAD SELECT SECURE 3830 383069 (Lead) IMPLANT
PACE AZURE XT DR MRI W1DR01 (Pacemaker) ×1 IMPLANT
PAD DEFIB RADIO PHYSIO CONN (PAD) ×1 IMPLANT
SELECT SECURE 3830 383069 (Lead) ×1 IMPLANT
SHEATH 7FR PRELUDE SNAP 13 (SHEATH) IMPLANT
SHEATH PROBE COVER 6X72 (BAG) IMPLANT
SLITTER 6232ADJ (MISCELLANEOUS) IMPLANT
TRAY PACEMAKER INSERTION (PACKS) ×1 IMPLANT
WIRE HI TORQ VERSACORE-J 145CM (WIRE) IMPLANT

## 2022-05-27 NOTE — ED Notes (Signed)
Patient resting, no s/s of any distress. No verbal c/o pain or discomfort. Patient able to follow verbal commands. VSS. Report given to oncoming nurse.

## 2022-05-27 NOTE — Consult Note (Signed)
ELECTROPHYSIOLOGY CONSULT NOTE    Patient ID: Connie [name] MRN: [id number], DOB/AGE: 83-Aug-1942 83 y.o.  Admit date: 05/26/2022 Date of Consult: 05/27/2022  Primary Physician: Glenis Smoker, MD Primary Cardiologist: Donato Heinz, MD  Electrophysiologist: New   Referring Provider: Dr. Gardiner Rhyme / Hochrein  Patient Profile: Connie [name] is a 81 y.o. 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 Gardiner Rhyme.  HPI:  Connie [name] is a 81 y.o. female with medical history as above.   she was admitted with anterior STEMI on 04/07/2022.  Cardiac catheterization showed thrombotic occlusion of mid LAD treated with drug-eluting stent.  He also underwent staged PCI DES to RCA on 04/11/2022.  Echocardiogram obtained on 04/07/2022 showed EF 35 to 40%, akinesis of the LV, entire anterior wall, anteroseptal wall, apical segment and the inferior segment, no LV apical thrombus.  Hospital course complicated by atrial fibrillation with RVR with postconversion pauses up to 7 seconds in duration.  She was started on amiodarone in order to suppress A-fib.  EP was consulted.  Pacemaker was deferred with hopes that amio would keep in NSR and thus prevent pauses.  Potassium4.1 (11/03 0355) Magnesium  2.0 (11/02 1547) Creatinine, ser  1.17* (11/03 0355) PLT  317 (11/03 0355) HGB  11.3* (11/03 0355) WBC 10.4 (11/03 0355) Troponin I (High Sensitivity)121* (11/02 1755).    Pt wore monitor that resulted 11/2 and showed > 200 pauses, some up to 6.8 seconds, felt to be associated with periods of "wooziness" and a "net coming over her head".  As she will need continued BB and possible AAD to help control her AF, it was felt she met criteria for tachy-brady syndrome and was sent to ED for further work up.   This am she is  feeling OK at rest. 2 short pauses overnight <2 seconds, asymptomatic. Currently she denies SOB or Chest pain.  She is taking 1 amiodarone daily. Stopped coreg as directed.   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   Multiple, noted on [place]   History of hiatal hernia    Hypertension    Hypothyroidism    Macular degeneration    Psoriatic arthritis ([place])    Sinus drainage    UTI (urinary tract infection)      Surgical History:  Past Surgical History:  Procedure Laterality Date   BACK SURGERY  2000   Dr Sherwood Gambler   bladder tack  2005   COLONOSCOPY     CORONARY STENT INTERVENTION N/A 04/11/2022   Procedure: CORONARY STENT INTERVENTION;  Surgeon: Sherren Mocha, MD;  Location: [facility];  Service: Cardiovascular;  Laterality: N/A;   DILATION AND CURETTAGE OF UTERUS     after miscarriage   LEFT HEART CATH AND CORONARY ANGIOGRAPHY N/A 04/07/2022   Procedure: LEFT HEART CATH AND CORONARY ANGIOGRAPHY;  Surgeon: Sherren Mocha, MD;  Location: [facility];  Service: Cardiovascular;  Laterality: N/A;   RECTAL PROLAPSE REPAIR  2008   SCAR REVISION Right 2017   Knee, Dr. Berenice Primas   TONSILLECTOMY     as a child   TOTAL KNEE ARTHROPLASTY Right 09/05/2014   Procedure: TOTAL KNEE ARTHROPLASTY;  Surgeon: Alta Corning,  MD;  Location: [facility];  Service: Orthopedics;  Laterality: Right;   TOTAL KNEE ARTHROPLASTY Left 01/10/2022   Procedure: TOTAL KNEE ARTHROPLASTY;  Surgeon: Dorna Leitz, MD;  Location: [facility];  Service: Orthopedics;  Laterality: Left;   TOTAL SHOULDER ARTHROPLASTY Left 11/17/2017   Procedure: LEFT TOTAL SHOULDER ARTHROPLASTY;  Surgeon: Dorna Leitz, MD;  Location: [facility];  Service: Orthopedics;  Laterality: Left;  with block   TUBAL LIGATION  1972     (Not in a hospital admission)   Inpatient Medications:   amiodarone  200 mg Oral Daily   atorvastatin  80  mg Oral Daily   clopidogrel  75 mg Oral Daily   empagliflozin  10 mg Oral Daily   losartan  50 mg Oral Daily   sertraline  100 mg Oral Daily    Allergies:  Allergies  Allergen Reactions   Penicillins Rash and Other (See Comments)    Has patient had a PCN reaction causing immediate rash, facial/tongue/throat swelling, SOB or lightheadedness with hypotension: Yes Has patient had a PCN reaction causing severe rash involving mucus membranes or skin necrosis: No Has patient had a PCN reaction that required hospitalization: No Has patient had a PCN reaction occurring within the last 10 years: No If all of the above answers are "NO", then may proceed with Cephalosporin use.    Sulfa Antibiotics Rash    Social History   Socioeconomic History   Marital status: Married    Spouse name: Barnabas Lister   Number of children: 2   Years of education: Not on file   Highest education level: Associate degree: academic program  Occupational History   Occupation: Retired  Tobacco Use   Smoking status: Never   Smokeless tobacco: Never  Vaping Use   Vaping Use: Never used  Substance and Sexual Activity   Alcohol use: Yes    Comment: rarely wine   Drug use: No   Sexual activity: Not on file  Other Topics Concern   Not on file  Social History Narrative   Lives at home with husband.  Son and daughter.    Social Determinants of Health   Financial Resource Strain: Low Risk  (04/11/2022)   Overall Financial Resource Strain (CARDIA)    Difficulty of Paying Living Expenses: Not hard at all  Food Insecurity: No Food Insecurity (04/11/2022)   Hunger Vital Sign    Worried About Running Out of Food in the Last Year: Never true    Ran Out of Food in the Last Year: Never true  Transportation Needs: No Transportation Needs (04/11/2022)   PRAPARE - Hydrologist (Medical): No    Lack of Transportation (Non-Medical): No  Physical Activity: Not on file  Stress: Not on file  Social  Connections: Not on file  Intimate Partner Violence: Not on file     Family History  Problem Relation Age of Onset   Cancer - Lung Mother    Heart attack Father 10   Breast cancer Neg Hx      Review of Systems: All other systems reviewed and are otherwise negative except as noted above.  Physical Exam: Vitals:   05/27/22 0404 05/27/22 0415 05/27/22 0431 05/27/22 0700  BP:   126/78 124/85  Pulse:  70 74 69  Resp:   17 17  Temp: 98 F (36.7 C)     TempSrc: Oral     SpO2:  99% 97% 91%  Weight:      Height:  GEN- The patient is well appearing, alert and oriented x 3 today.   HEENT: normocephalic, atraumatic; sclera clear, conjunctiva pink; hearing intact; oropharynx clear; neck supple Lungs- Clear to ausculation bilaterally, normal work of breathing.  No wheezes, rales, rhonchi Heart- Regular rate and rhythm, no murmurs, rubs or gallops GI- soft, non-tender, non-distended, bowel sounds present Extremities- no clubbing, cyanosis, or edema; DP/PT/radial pulses 2+ bilaterally MS- no significant deformity or atrophy Skin- warm and dry, no rash or lesion Psych- euthymic mood, full affect Neuro- strength and sensation are intact  Labs:   Lab Results  Component Value Date   WBC 10.4 05/27/2022   HGB 11.3 (L) 05/27/2022   HCT 36.0 05/27/2022   MCV 86.3 05/27/2022   PLT 317 05/27/2022    Recent Labs  Lab 05/27/22 0355  NA 138  K 4.1  CL 105  CO2 23  BUN 14  CREATININE 1.17*  CALCIUM 8.9  GLUCOSE 86      Radiology/Studies: DG Chest 2 View  Result Date: 05/26/2022 CLINICAL DATA:  Tachybrady syndrome.  Cough and dizziness. EXAM: CHEST - 2 VIEW COMPARISON:  Chest radiographs 04/21/2022 FINDINGS: The cardiac silhouette is upper limits of normal in size. Small bilateral pleural effusions have slightly decreased in size. No airspace consolidation, edema, or pneumothorax is identified. A left shoulder arthroplasty is noted. IMPRESSION: Slightly decreased size of  small bilateral pleural effusions. Electronically Signed   By: Logan Bores M.D.   On: 05/26/2022 16:20   LONG TERM MONITOR (3-14 DAYS)  Result Date: 05/26/2022   224 pauses, longest lasting 6.8 seconds   12% Afib burden with average rate 105 bpm Patch Wear Time:  13 days and 21 hours (2023-10-13T10:48:39-0400 to 2023-10-27T08:32:02-399) Patient had a min HR of 35 bpm, max HR of 149 bpm, and avg HR of 74 bpm. Predominant underlying rhythm was Sinus Rhythm. Intermittent Bundle Branch Block was present. Atrial Fibrillation/Flutter occurred (12% burden), ranging from 57-149 bpm (avg of 105 bpm), the longest lasting 1 hour 6 mins with an avg rate of 111 bpm. 224 Pauses occurred, the longest lasting 6.8 secs (9 bpm). Atrial Fibrillation/Flutter and Pause were detected within +/- 45 seconds of symptomatic patient event(s). Isolated SVEs were rare (<1.0%), SVE Couplets were rare (<1.0%), and SVE Triplets were rare (<1.0%). Isolated VEs were rare (<1.0%), and no VE Couplets or VE Triplets were present. Inverted QRS complexes possibly due to inverted placement of device. MD notification criteria for Pauses met - report posted prior to notification per account request (MS).     EKG:on arrival shows NSR at 75 bpm with RBBB at 128 ms (personally reviewed)  TELEMETRY: NSR with occasional skipped beat, mostly in 70s (personally reviewed)  Assessment/Plan: 1.  Tachy-brady syndrome 2. Paroxysmal atrial fibrillation With symptomatic pauses up to 6.8 seconds Fastest HRs on monitor were up into 140s, she will continue to require BB +/- AAD Explained risks, benefits, and alternatives to PPM implantation, including but not limited to bleeding, infection, pneumothorax, pericardial effusion, lead dislodgement, heart attack, stroke, or death.  Pt verbalized understanding and agrees to proceed. She is unlikely to benefit from an ICD for primary prevention given advanced age, but will update Echo s/p PCI  2. CAD s/p  staged PCI 03/2022 Denies chest pain. HS trop flat.  Update Echo s/p PCI She understands she will be at higher risk of bleeding and or developing a hematoma as we cannot hold her plavix due to recent PCI.  Resume BB post pacing.   3.  Chronic systolic CHF Echo 90-93% in September pre PCI.  Update.  GDMT as tolerated. Resume BB post pacing.    Dr. Myles Gip has seen.   For questions or updates, please contact [place] Please consult [email or web address] for contact info under Cardiology/STEMI.  Jacalyn Lefevre, PA-C  05/27/2022 8:04 AM

## 2022-05-27 NOTE — Progress Notes (Signed)
Patient returned from cath lab at 1550hrs.  Dressing to left shoulder intact with no visible drainage. Left arm in sling.  Patient given post pacer instructions, patient verbalized understanding.  Husband at bedside.

## 2022-05-27 NOTE — Progress Notes (Signed)
Patient transferred from ED at 1300hrs. Oriented to room and plan of care for shift.  Husband at bedside.

## 2022-05-27 NOTE — Discharge Instructions (Signed)
Hold off on restart Eliquis until the morning of Monday 11/6    After Your Pacemaker   You have a Med[employer]onic Pacemaker  ACTIVITY Do not lift your arm above shoulder height for 1 week after your procedure. After 7 days, you may progress as below.  You should remove your sling 24 hours after your procedure, unless otherwise instructed by your provider.     Friday June 03, 2022  Saturday June 04, 2022 Sunday June 05, 2022 Monday June 06, 2022   Do not lift, push, pull, or carry anything over 10 pounds with the affected arm until 6 weeks (Friday July 08, 2022 ) after your procedure.   You may drive AFTER your wound check, unless you have been told otherwise by your provider.   Ask your healthcare provider when you can go back to work   INCISION/Dressing If you are on a blood thinner such as Coumadin, Xarelto, Eliquis, Plavix, or Pradaxa please confirm with your provider when this should be resumed.   If large square, outer bandage is left in place, this can be removed after 24 hours from your procedure. Do not remove steri-strips or glue as below.   Monitor your Pacemaker site for redness, swelling, and drainage. Call the device clinic at [phone] if you experience these symptoms or fever/chills.  If your incision is sealed with Steri-strips or staples, you may shower 7 days after your procedure or when told by your provider. Do not remove the steri-strips or let the shower hit directly on your site. You may wash around your site with soap and water.    If you were discharged in a sling, please do not wear this during the day more than 48 hours after your surgery unless otherwise instructed. This may increase the risk of stiffness and soreness in your shoulder.   Avoid lotions, ointments, or perfumes over your incision until it is well-healed.  You may use a hot tub or a pool AFTER your wound check appointment if the incision is completely closed.  Pacemaker  Alerts:  Some alerts are vibratory and others beep. These are NOT emergencies. Please call our office to let us know. If this occurs at night or on weekends, it can wait until the next business day. Send a remote transmission.  If your device is capable of reading fluid status (for heart failure), you will be offered monthly monitoring to review this with you.   DEVICE MANAGEMENT Remote monitoring is used to monitor your pacemaker from home. This monitoring is scheduled every 91 days by our office. It allows [place] to keep an eye on the functioning of your device to ensure it is working properly. You will routinely see your Electrophysiologist annually (more often if necessary).   You should receive your ID card for your new device in 4-8 weeks. Keep this card with you at all times once received. Consider wearing a medical alert bracelet or necklace.  Your Pacemaker may be MRI compatible. This will be discussed at your next office visit/wound check.  You should avoid contact with strong electric or magnetic fields.   Do not use amateur (ham) radio equipment or electric (arc) welding torches. MP3 player headphones with magnets should not be used. Some devices are safe to use if held at least 12 inches (30 cm) from your Pacemaker. These include power tools, lawn mowers, and speakers. If you are unsure if something is safe to use, ask your health care provider.  When using your cell  phone, hold it to the ear that is on the opposite side from the Pacemaker. Do not leave your cell phone in a pocket over the Pacemaker.  You may safely use electric blankets, heating pads, computers, and microwave ovens.  Call the office right away if: You have chest pain. You feel more short of breath than you have felt before. You feel more light-headed than you have felt before. Your incision starts to open up.  This information is not intended to replace advice given to you by your health care provider. Make sure you  discuss any questions you have with your health care provider.

## 2022-05-27 NOTE — Progress Notes (Signed)
  Transition of Care [facility]) Screening Note   Patient Details  Name: Connie [name] Date of Birth: Mar 02, 1941   Transition of Care [facility]) CM/SW Contact:    Milas Gain, [place] Phone Number: 05/27/2022, 4:36 PM    Transition of Care Department [facility]) has reviewed patient and no TOC needs have been identified at this time. We will continue to monitor patient advancement through interdisciplinary progression rounds. If new patient transition needs arise, please place a TOC consult.

## 2022-05-27 NOTE — Progress Notes (Signed)
Pt taken down to cath lab for PPM.

## 2022-05-27 NOTE — Progress Notes (Signed)
   Cardiologist:  Gilman Schmidt EP:  Mealor  Subjective:  Denies SSCP, palpitations or Dyspnea Has had episdoes of "Wooziness" with pauses   Objective:  Vitals:   05/27/22 0404 05/27/22 0415 05/27/22 0431 05/27/22 0700  BP:   126/78 124/85  Pulse:  70 74 69  Resp:   17 17  Temp: 98 F (36.7 C)     TempSrc: Oral     SpO2:  99% 97% 91%  Weight:      Height:        Intake/Output from previous day: No intake or output data in the 24 hours ending 05/27/22 0175  Physical Exam: Affect appropriate Elderly female  HEENT: normal Neck supple with no adenopathy JVP normal no bruits no thyromegaly Lungs clear with no wheezing and good diaphragmatic motion Heart:  S1/S2 no murmur, no rub, gallop or click PMI normal Abdomen: benighn, BS positve, no tenderness, no AAA no bruit.  No HSM or HJR Distal pulses intact with no bruits No edema Neuro non-focal Skin warm and dry No muscular weakness   Lab Results: Basic Metabolic Panel: Recent Labs    05/26/22 1547 05/27/22 0355  NA 136 138  K 4.3 4.1  CL 104 105  CO2 21* 23  GLUCOSE 96 86  BUN 14 14  CREATININE 1.18* 1.17*  CALCIUM 9.0 8.9  MG 2.0  --    Liver Function Tests: No results for input(s): "AST", "ALT", "ALKPHOS", "BILITOT", "PROT", "ALBUMIN" in the last 72 hours. No results for input(s): "LIPASE", "AMYLASE" in the last 72 hours. CBC: Recent Labs    05/26/22 1547 05/27/22 0355  WBC 10.7* 10.4  NEUTROABS  --  7.7  HGB 12.3 11.3*  HCT 38.7 36.0  MCV 86.0 86.3  PLT 370 317     Imaging: Imaging results have been reviewed  Cardiac Studies:  ECG: SR RBBB anterior MI   Telemetry:  NSR  Echo: EF 35-40%  Medications:    amiodarone  200 mg Oral Daily   atorvastatin  80 mg Oral Daily   clopidogrel  75 mg Oral Daily   empagliflozin  10 mg Oral Daily   losartan  50 mg Oral Daily   sertraline  100 mg Oral Daily      sodium chloride      Assessment/Plan:   SSS/PAF:  sent to ED for PPM. Has had post  conversion pauses symptomatic 7 seconds Coreg held. Given age and EF > 35% suspect she may just have LBBB pacing Dr Myles Gip EP to see. She has been on ASA/Plavix Increased risk pocket bleeding Eliquis last dose yesterday morning CAD:  Anterior MI with stent 04/11/22 DAT beta blocker held Ischemic DCM:  euovelmic on losartan  HLD:  continue statin   Jenkins Rouge 05/27/2022, 8:26 AM

## 2022-05-28 ENCOUNTER — Inpatient Hospital Stay (HOSPITAL_COMMUNITY): Payer: Medicare PPO

## 2022-05-28 DIAGNOSIS — I495 Sick sinus syndrome: Secondary | ICD-10-CM | POA: Diagnosis not present

## 2022-05-28 DIAGNOSIS — I2510 Atherosclerotic heart disease of native coronary artery without angina pectoris: Secondary | ICD-10-CM | POA: Insufficient documentation

## 2022-05-28 MED ORDER — CARVEDILOL 6.25 MG PO TABS: 6.2500 mg | ORAL_TABLET | Freq: Two times a day (BID) | ORAL | 5 refills | Status: DC

## 2022-05-28 NOTE — Progress Notes (Signed)
Staff message sent to [employer] and Dr. Gardiner Rhyme to see if the scheduled echo in Dec is still needed. Patient had a echo during this admission.

## 2022-05-28 NOTE — Progress Notes (Signed)
After [employer] interrogation pt appeared to go into a-flutter for ~ 30 mins. Once pt returned from x-ray EKG was obtained but pt was already back in NSR.

## 2022-05-28 NOTE — Discharge Summary (Addendum)
Discharge Summary    Patient ID: Connie [name] MRN: [id number]; DOB: 04-Jun-1941  Admit date: 05/26/2022 Discharge date: 05/28/2022  PCP:  Glenis Smoker, MD   Opp Providers Cardiologist:  Donato Heinz, MD  Electrophysiologist:  Melida Quitter, MD       Discharge Diagnoses    Principal Problem:   Tachy-brady syndrome [facility]) Active Problems:   PAF (paroxysmal atrial fibrillation) ([place])   Hyperlipidemia   Ischemic cardiomyopathy   Hypertension   CAD (coronary artery disease)    Diagnostic Studies/Procedures    Echo 05/27/2022  1. Akinesis of the anteroseptal, apical and distal inferior walls with  overall moderate to severe LV dysfunction.   2. Left ventricular ejection fraction, by estimation, is 30 to 35%. The  left ventricle has moderate to severely decreased function. The left  ventricle demonstrates regional wall motion abnormalities (see scoring  diagram/findings for description). Left  ventricular diastolic parameters are consistent with Grade I diastolic  dysfunction (impaired relaxation).   3. Right ventricular systolic function is normal. The right ventricular  size is normal.   4. The mitral valve is normal in structure. Trivial mitral valve  regurgitation. No evidence of mitral stenosis.   5. The aortic valve is tricuspid. Aortic valve regurgitation is not  visualized. Aortic valve sclerosis is present, with no evidence of aortic  valve stenosis.   6. The inferior vena cava is normal in size with greater than 50%  respiratory variability, suggesting right atrial pressure of 3 mmHg.    Pacemaker implantation 05/27/2022 PREPROCEDURE DIAGNOSES:  1. Irreversible symptomatic bradycardia due to sinus pauses > 6 seconds  POSTPROCEDURE DIAGNOSES:  1. Irreversible symptomatic bradycardia due to sinus pauses > 6 seconds  PROCEDURES:  1. Dual chamber permanent pacemaker implantation  right atrial lead (model 5076, serial  [id number])  RV pace/sense lead was advanced to the RV mid septum (model 3830, serial [id number])  pulse generator (model Azure XT, serial [id number])   CONCLUSIONS:  1. Successful dual chamber permanent pacemaker implantation 3. No early apparent complications.  _____________   History of Present Illness     Connie [name] is a 81 y.o. female with past medical history of CAD, chronic systolic heart failure, ischemic cardiomyopathy, PAF, hypertension, hyperlipidemia and hypothyroidism.  Patient was referred to Dr. Gardiner Rhyme in August 2020 in the setting of palpitation.  A 7-day Zio patch monitor in September 2022 showed 2% atrial flutter burden.  Echocardiogram in September 2022 showed normal EF, no significant valve disease.  Calcium score in September 2022 was 604, which placed the patient on 81st percentile for age and sex matched control.  Myoview in January 2023 showed no evidence of ischemia, EF 79%.  Unfortunately she was admitted with anterior STEMI on 04/07/2022.  Cardiac catheterization showed thrombotic occlusion of mid LAD treated with drug-eluting stent.  He also underwent staged PCI DES to RCA on 04/11/2022.  Echocardiogram obtained on 04/07/2022 showed EF 35 to 40%, akinesis of the LV, entire anterior wall, anteroseptal wall, apical segment and the inferior segment, no LV apical thrombus.  Hospital course complicated by atrial fibrillation with RVR with postconversion pauses up to 7 seconds in duration.  She was started on amiodarone in order to suppress A-fib.  EP was consulted.  Pacemaker was deferred.  Patient was discharged on triple therapy including Eliquis, Plavix and aspirin.  Aspirin was later discontinued after 1 month.  Most recently, spironolactone was also discontinued due to hyperkalemia.  She was placed on  a heart monitor, heart monitor captured episodes of A-fib with RVR and the followed by long pauses up to 7 seconds.  The case was reviewed by Dr. Gardiner Rhyme who felt the  patient likely has tachy-brady syndrome and Connie [name] need a pacemaker.  She was instructed to stop carvedilol yesterday.  Her last dose of carvedilol was yesterday morning.  The last dose of Eliquis was this morning.   Patient finally was referred to ED on 05/26/2022.  She denies any presyncope or syncope, however describe occasional episodes of wooziness which she described as a net covering her head.  Symptoms only last for a few seconds before going away.  While in the ED, she did have 2 episode of brief pauses less than 2 seconds.  No A-fib was noted during emergency room stay.  She denies any recent chest pain.  Serial troponin obtained in the emergency room was 102-->121.  She denies any shortness of breath.  She has no recent fever or chill.  White blood cell count was borderline elevated at 10.7.    Hospital Course     Consultants: N/A   Patient was admitted to cardiology service for tachy-brady syndrome.  Recent heart monitor revealed episodes of atrial fibrillation with RVR followed by prolonged pauses up to 7 seconds.  Carvedilol was discontinued prior to hospitalization by Dr. Gardiner Rhyme.  Patient was seen by EP service on 05/27/2022, patient was agreeable to undergo pacemaker implantation with plan to increase beta-blocker dosage afterward to provide better rate control to suppress A-fib.  Echocardiogram was repeated on 06/13/2022, this revealed EF 30 to 35%, akinesis of the anteroseptal, apical and distal inferior wall with overall moderate to severe LV dysfunction, grade 1 DD, trivial MR.  Patient eventually underwent Medtronic dual-chamber pacemaker on 05/27/2022 by Dr. Doralee Albino of EP service.  Patient was seen on the following morning on 05/28/2022.  Chest x-ray obtained in the morning showed no pneumothorax or other acute changes, new demonstration of the left chest 2-lead pacing system.  Patient was seen by Dr. Curt Bears of EP service who felt she is stable for discharge from the cardiac  perspective.  We Adnan Vanvoorhis restart her beta-blocker carvedilol at a higher dose of 6.25 mg twice a day for suppression of atrial fibrillation.  Per Dr. Curt Bears, patient may restart Eliquis Monday (11/6) morning.     Did the patient have an acute coronary syndrome (MI, NSTEMI, STEMI, etc) this admission?:  No                               Did the patient have a percutaneous coronary intervention (stent / angioplasty)?:  No.          _____________  Discharge Vitals Blood pressure 122/67, pulse 73, temperature 98.1 F (36.7 C), temperature source Oral, resp. rate 18, height 5' (1.524 m), weight 69.4 kg, SpO2 97 %.  Filed Weights   05/26/22 1542 05/27/22 1256  Weight: 73.5 kg 69.4 kg   Physical Exam   GEN: No acute distress.   Neck: No JVD Cardiac: RRR, no murmurs, rubs, or gallops. Pacemaker site clean, dry, without active bleeding Respiratory: Clear to auscultation bilaterally. GI: Soft, nontender, non-distended  MS: No edema; No deformity. Neuro:  Nonfocal  Psych: Normal affect   Labs & Radiologic Studies    CBC Recent Labs    05/26/22 1547 05/27/22 0355  WBC 10.7* 10.4  NEUTROABS  --  7.7  HGB 12.3 11.3*  HCT 38.7 36.0  MCV 86.0 86.3  PLT 370 510   Basic Metabolic Panel Recent Labs    05/26/22 1547 05/27/22 0355  NA 136 138  K 4.3 4.1  CL 104 105  CO2 21* 23  GLUCOSE 96 86  BUN 14 14  CREATININE 1.18* 1.17*  CALCIUM 9.0 8.9  MG 2.0  --    Liver Function Tests No results for input(s): "AST", "ALT", "ALKPHOS", "BILITOT", "PROT", "ALBUMIN" in the last 72 hours. No results for input(s): "LIPASE", "AMYLASE" in the last 72 hours. High Sensitivity Troponin:   Recent Labs  Lab 05/26/22 1547 05/26/22 1755 05/27/22 0355  TROPONINIHS 102* 121* 124*    BNP Invalid input(s): "POCBNP" D-Dimer No results for input(s): "DDIMER" in the last 72 hours. Hemoglobin A1C No results for input(s): "HGBA1C" in the last 72 hours. Fasting Lipid Panel No results for  input(s): "CHOL", "HDL", "LDLCALC", "TRIG", "CHOLHDL", "LDLDIRECT" in the last 72 hours. Thyroid Function Tests No results for input(s): "TSH", "T4TOTAL", "T3FREE", "THYROIDAB" in the last 72 hours.  Invalid input(s): "FREET3" _____________  DG Chest 2 View  Result Date: 05/28/2022 CLINICAL DATA:  [id number] with cardiac device in-situ. EXAM: CHEST - 2 VIEW COMPARISON:  PA Lat preprocedure study 05/26/2022 FINDINGS: There is new demonstration of a left chest dual lead pacing system with 1 wire terminating in the right atrium and the other in the right ventricle. Again noted are small layering pleural effusions, slightly greater fluid on the left, unchanged. There is overlying linear atelectasis in the lung bases and left perihilar linear atelectasis or scarring again noted. No focal pneumonia is evident. Heart size and vasculature are normal apart from calcifications in the aortic arch. The mediastinum is normally outlined. There is osteopenia, thoracic kyphosis and spondylosis, and old left shoulder arthroplasty. IMPRESSION: 1. New demonstration of a left chest dual lead pacing system. 2. Small layering pleural effusions and overlying atelectasis, unchanged. 3. No pneumothorax or other acute changes. Electronically Signed   By: Telford Nab M.D.   On: 05/28/2022 07:18   ECHOCARDIOGRAM COMPLETE  Result Date: 05/27/2022    ECHOCARDIOGRAM REPORT   Patient Name:   Connie [name] Date of Exam: 05/27/2022 Medical Rec #:  [id number]          Height:       60.0 in Accession #:    [id number]         Weight:       162.0 lb Date of Birth:  05/12/1941          BSA:          1.707 m Patient Age:    22 years           BP:           131/71 mmHg Patient Gender: F                  HR:           75 bpm. Exam Location:  Inpatient Procedure: 2D Echo, Color Doppler and Cardiac Doppler Indications:    AFIB  History:        Patient has prior history of Echocardiogram examinations, most                 recent 04/07/2022. Risk  Factors:Hypertension.  Sonographer:    Memory Argue Referring Phys: [id number] [place]  1. Akinesis of the anteroseptal, apical and distal inferior walls with overall moderate to severe LV dysfunction.  2.  Left ventricular ejection fraction, by estimation, is 30 to 35%. The left ventricle has moderate to severely decreased function. The left ventricle demonstrates regional wall motion abnormalities (see scoring diagram/findings for description). Left ventricular diastolic parameters are consistent with Grade I diastolic dysfunction (impaired relaxation).  3. Right ventricular systolic function is normal. The right ventricular size is normal.  4. The mitral valve is normal in structure. Trivial mitral valve regurgitation. No evidence of mitral stenosis.  5. The aortic valve is tricuspid. Aortic valve regurgitation is not visualized. Aortic valve sclerosis is present, with no evidence of aortic valve stenosis.  6. The inferior vena cava is normal in size with greater than 50% respiratory variability, suggesting right atrial pressure of 3 mmHg. FINDINGS  Left Ventricle: Left ventricular ejection fraction, by estimation, is 30 to 35%. The left ventricle has moderate to severely decreased function. The left ventricle demonstrates regional wall motion abnormalities. The left ventricular internal cavity size was normal in size. There is no left ventricular hypertrophy. Left ventricular diastolic parameters are consistent with Grade I diastolic dysfunction (impaired relaxation). Right Ventricle: The right ventricular size is normal. Right ventricular systolic function is normal. Left Atrium: Left atrial size was normal in size. Right Atrium: Right atrial size was normal in size. Pericardium: Trivial pericardial effusion is present. Mitral Valve: The mitral valve is normal in structure. Mild mitral annular calcification. Trivial mitral valve regurgitation. No evidence of mitral valve stenosis.  Tricuspid Valve: The tricuspid valve is normal in structure. Tricuspid valve regurgitation is trivial. No evidence of tricuspid stenosis. Aortic Valve: The aortic valve is tricuspid. Aortic valve regurgitation is not visualized. Aortic valve sclerosis is present, with no evidence of aortic valve stenosis. Aortic valve mean gradient measures 3.0 mmHg. Aortic valve peak gradient measures 5.7  mmHg. Aortic valve area, by VTI measures 2.58 cm. Pulmonic Valve: The pulmonic valve was not well visualized. Pulmonic valve regurgitation is not visualized. No evidence of pulmonic stenosis. Aorta: The aortic root is normal in size and structure. Venous: The inferior vena cava is normal in size with greater than 50% respiratory variability, suggesting right atrial pressure of 3 mmHg. IAS/Shunts: No atrial level shunt detected by color flow Doppler. Additional Comments: Akinesis of the anteroseptal, apical and distal inferior walls with overall moderate to severe LV dysfunction.  LEFT VENTRICLE PLAX 2D LVIDd:         4.70 cm      Diastology LVIDs:         3.80 cm      LV e' medial:    7.30 cm/s LV PW:         0.80 cm      LV E/e' medial:  9.8 LV IVS:        0.80 cm      LV e' lateral:   6.06 cm/s LVOT diam:     2.15 cm      LV E/e' lateral: 11.8 LV SV:         62 LV SV Index:   37 LVOT Area:     3.63 cm  LV Volumes (MOD) LV vol d, MOD A4C: 137.0 ml LV vol s, MOD A4C: 86.5 ml LV SV MOD A4C:     137.0 ml RIGHT VENTRICLE TAPSE (M-mode): 1.8 cm LEFT ATRIUM             Index        RIGHT ATRIUM           Index LA diam:  3.60 cm 2.11 cm/m   RA Area:     10.70 cm LA Vol (A2C):   58.7 ml 34.39 ml/m  RA Volume:   20.30 ml  11.89 ml/m LA Vol (A4C):   41.4 ml 24.26 ml/m LA Biplane Vol: 50.6 ml 29.65 ml/m  AORTIC VALVE AV Area (Vmax):    2.60 cm AV Area (Vmean):   2.46 cm AV Area (VTI):     2.58 cm AV Vmax:           119.00 cm/s AV Vmean:          83.400 cm/s AV VTI:            0.242 m AV Peak Grad:      5.7 mmHg AV Mean  Grad:      3.0 mmHg LVOT Vmax:         85.20 cm/s LVOT Vmean:        56.600 cm/s LVOT VTI:          0.172 m LVOT/AV VTI ratio: 0.71  AORTA Ao Root diam: 3.10 cm MITRAL VALVE MV Area (PHT): 5.54 cm     SHUNTS MV Decel Time: 137 msec     Systemic VTI:  0.17 m MR Peak grad: 50.6 mmHg     Systemic Diam: 2.15 cm MR Vmax:      355.50 cm/s MV E velocity: 71.80 cm/s MV A velocity: 141.00 cm/s MV E/A ratio:  0.51 Kirk Ruths MD Electronically signed by Kirk Ruths MD Signature Date/Time: 05/27/2022/2:03:53 PM    Final    DG Chest 2 View  Result Date: 05/26/2022 CLINICAL DATA:  Tachybrady syndrome.  Cough and dizziness. EXAM: CHEST - 2 VIEW COMPARISON:  Chest radiographs 04/21/2022 FINDINGS: The cardiac silhouette is upper limits of normal in size. Small bilateral pleural effusions have slightly decreased in size. No airspace consolidation, edema, or pneumothorax is identified. A left shoulder arthroplasty is noted. IMPRESSION: Slightly decreased size of small bilateral pleural effusions. Electronically Signed   By: Logan Bores M.D.   On: 05/26/2022 16:20   LONG TERM MONITOR (3-14 DAYS)  Result Date: 05/26/2022   224 pauses, longest lasting 6.8 seconds   12% Afib burden with average rate 105 bpm Patch Wear Time:  13 days and 21 hours (2023-10-13T10:48:39-0400 to 2023-10-27T08:32:02-399) Patient had a min HR of 35 bpm, max HR of 149 bpm, and avg HR of 74 bpm. Predominant underlying rhythm was Sinus Rhythm. Intermittent Bundle Branch Block was present. Atrial Fibrillation/Flutter occurred (12% burden), ranging from 57-149 bpm (avg of 105 bpm), the longest lasting 1 hour 6 mins with an avg rate of 111 bpm. 224 Pauses occurred, the longest lasting 6.8 secs (9 bpm). Atrial Fibrillation/Flutter and Pause were detected within +/- 45 seconds of symptomatic patient event(s). Isolated SVEs were rare (<1.0%), SVE Couplets were rare (<1.0%), and SVE Triplets were rare (<1.0%). Isolated VEs were rare (<1.0%), and no VE  Couplets or VE Triplets were present. Inverted QRS complexes possibly due to inverted placement of device. MD notification criteria for Pauses met - report posted prior to notification per account request (MS).   Disposition   Pt is being discharged home today in good condition.  Follow-up Plans & Appointments     Follow-up [facility] Follow up on 06/09/2022.   Specialty: Cardiology Why: @ 2PM. Pacemaker wound check Contact information: [address] [place] [id number] [place] [phone]        Lenna Sciara, NP Follow up on 07/13/2022.   Specialties: Nurse Practitioner, Family Medicine Why: 11:20 AM. Cardiology follow up Contact information: [address] [place] [address] [phone]         Mealor, Yetta Barre, MD Follow up on 08/29/2022.   Specialty: Cardiology Why: 2:30PM. Electrophysiology follow up Contact information: [place] [address] [phone]                   Discharge Medications   Allergies as of 05/28/2022       Reactions   Penicillins Rash, Other (See Comments)   Has patient had a PCN reaction causing immediate rash, facial/tongue/throat swelling, SOB or lightheadedness with hypotension: Yes Has patient had a PCN reaction causing severe rash involving mucus membranes or skin necrosis: No Has patient had a PCN reaction that required hospitalization: No Has patient had a PCN reaction occurring within the last 10 years: No If all of the above answers are "NO", then may proceed with Cephalosporin use.   Sulfa Antibiotics Rash        Medication List     STOP taking these medications    spironolactone 25 MG tablet Commonly known as: ALDACTONE       TAKE these medications    amiodarone 200 MG tablet Commonly known as: PACERONE Take 2 tablets (400 mg total) by mouth 2  (two) times daily for 4 days, THEN 1 tablet (200 mg total) 2 (two) times daily for 7 days, THEN 1 tablet (200 mg total) daily. Start taking on: April 14, 2022   apixaban 5 MG Tabs tablet Commonly known as: ELIQUIS Take 1 tablet (5 mg total) by mouth 2 (two) times daily.   atorvastatin 80 MG tablet Commonly known as: LIPITOR Take 1 tablet (80 mg total) by mouth daily.   carvedilol 6.25 MG tablet Commonly known as: COREG Take 1 tablet (6.25 mg total) by mouth 2 (two) times daily.   clopidogrel 75 MG tablet Commonly known as: PLAVIX Take 1 tablet (75 mg total) by mouth daily.   Comirnaty syringe Generic drug: COVID-19 mRNA vaccine 2023-2024 Inject into the muscle.   empagliflozin 10 MG Tabs tablet Commonly known as: JARDIANCE Take 1 tablet (10 mg total) by mouth daily.   Fluad Quadrivalent 0.5 ML injection Generic drug: influenza vaccine adjuvanted Inject into the muscle.   furosemide 20 MG tablet Commonly known as: LASIX TAKE 1 TABLET BY MOUTH EVERY MONDAY, WEDNESDAY, FRIDAY   levothyroxine 88 MCG tablet Commonly known as: SYNTHROID Take 88 mcg by mouth daily before breakfast.   losartan 25 MG tablet Commonly known as: COZAAR Take 1 tablet (25 mg total) by mouth daily.   nitroGLYCERIN 0.4 MG SL tablet Commonly known as: NITROSTAT Place 1 tablet (0.4 mg total) under the tongue every 5 (five) minutes x 3 doses as needed for chest pain.   sertraline 100 MG tablet Commonly known as: ZOLOFT Take 100 mg by mouth daily.           Outstanding Labs/Studies   N/A  Duration of Discharge Encounter   Greater than 30 minutes including physician time.  Hilbert Corrigan, PA 05/28/2022, 8:34 AM    I have seen and examined this patient with Almyra Deforest.  Agree with above, note added to reflect my findings.  On exam, RRR, no murmurs, lungs clear.  She is now status post Medtronic pacemaker for  sick sinus syndrome.  Device functioning appropriately.  Chest x-ray and  interrogation without issue.  Plan for discharge today with follow-up in device clinic.  Aden Youngman M. Torrion Witter MD 05/28/2022 8:39 AM

## 2022-05-30 ENCOUNTER — Encounter (HOSPITAL_COMMUNITY): Payer: Self-pay | Admitting: Cardiovascular Disease

## 2022-05-30 ENCOUNTER — Telehealth: Payer: Self-pay | Admitting: Cardiology

## 2022-05-30 NOTE — Telephone Encounter (Signed)
Spoke with pt husband, aware per discharge summ from 05/28/22, she should be taking carvedilol 6.25 mg twice daily. He reports he will restart, he does not need a prescription at this time.

## 2022-05-30 NOTE — Telephone Encounter (Signed)
Pt c/o medication issue:  1. Name of Medication:   carvedilol (COREG) 6.25 MG tablet    2. How are you currently taking this medication (dosage and times per day)? Not taking   3. Are you having a reaction (difficulty breathing--STAT)?   4. What is your medication issue? Pt spouse calling because pt was taken off this medication and he wants to know if she still supposed to be off of it

## 2022-06-08 ENCOUNTER — Encounter: Payer: Self-pay | Admitting: Cardiology

## 2022-06-09 ENCOUNTER — Ambulatory Visit: Payer: Medicare PPO | Attending: Internal Medicine

## 2022-06-09 ENCOUNTER — Institutional Professional Consult (permissible substitution): Payer: Medicare PPO | Admitting: Internal Medicine

## 2022-06-09 ENCOUNTER — Telehealth: Payer: Self-pay

## 2022-06-09 DIAGNOSIS — I495 Sick sinus syndrome: Secondary | ICD-10-CM

## 2022-06-09 LAB — CUP PACEART INCLINIC DEVICE CHECK
Battery Remaining Longevity: 163 mo
Battery Voltage: 3.22 V
Brady Statistic AP VP Percent: 1.26 %
Brady Statistic AP VS Percent: 39.08 %
Brady Statistic AS VP Percent: 0.04 %
Brady Statistic AS VS Percent: 59.62 %
Brady Statistic RA Percent Paced: 40.29 %
Brady Statistic RV Percent Paced: 1.32 %
Date Time Interrogation Session: 20231116170544
Implantable Lead Connection Status: 753985
Implantable Lead Connection Status: 753985
Implantable Lead Implant Date: 20231103
Implantable Lead Implant Date: 20231103
Implantable Lead Location: 753859
Implantable Lead Location: 753860
Implantable Lead Model: 3830
Implantable Lead Model: 5076
Implantable Pulse Generator Implant Date: 20231103
Lead Channel Impedance Value: 285 Ohm
Lead Channel Impedance Value: 304 Ohm
Lead Channel Impedance Value: 399 Ohm
Lead Channel Impedance Value: 646 Ohm
Lead Channel Pacing Threshold Amplitude: 0.625 V
Lead Channel Pacing Threshold Amplitude: 1.25 V
Lead Channel Pacing Threshold Pulse Width: 0.4 ms
Lead Channel Pacing Threshold Pulse Width: 0.4 ms
Lead Channel Sensing Intrinsic Amplitude: 1.25 mV
Lead Channel Sensing Intrinsic Amplitude: 2.25 mV
Lead Channel Sensing Intrinsic Amplitude: 2.875 mV
Lead Channel Sensing Intrinsic Amplitude: 3.125 mV
Lead Channel Setting Pacing Amplitude: 3.5 V
Lead Channel Setting Pacing Amplitude: 3.5 V
Lead Channel Setting Pacing Pulse Width: 0.4 ms
Lead Channel Setting Sensing Sensitivity: 0.6 mV
Zone Setting Status: 755011

## 2022-06-09 NOTE — Telephone Encounter (Signed)
Received a message from [employer] that patients remote monitor was unplugged. I sent patient mychart message requesting her to plug in. Could someone follow up on this?  Thanks, Marliss Czar!

## 2022-06-09 NOTE — Patient Instructions (Signed)

## 2022-06-09 NOTE — Progress Notes (Signed)
Wound check appointment. Steri-strips removed. Wound without redness or edema. Incision edges approximated, wound well healed. Normal device function. Thresholds, sensing, and impedances consistent with implant measurements. Device programmed at 3.5V/auto capture programmed on for extra safety margin until 3 month visit. Histogram distribution appropriate for patient and level of activity. Known afib on Eliquis. Patient educated about wound care, arm mobility, lifting restrictions. ROV in 3 months with implanting physician.

## 2022-06-10 NOTE — Telephone Encounter (Signed)
Pt monitor updated 06/03/2022. Pt monitor is up to date and I do not see where the monitor is disconnected?

## 2022-06-13 NOTE — Telephone Encounter (Signed)
Transmission received 06/13/2022 at 10:36 am

## 2022-06-20 ENCOUNTER — Telehealth: Payer: Self-pay | Admitting: Cardiology

## 2022-06-20 MED ORDER — AMIODARONE HCL 200 MG PO TABS: 200.0000 mg | ORAL_TABLET | Freq: Every day | ORAL | 0 refills | Status: DC

## 2022-06-20 NOTE — Telephone Encounter (Addendum)
Left message for patient to call back to confirm dosage  Rx sent to pharmacy-amiodarone 200 mg daily per Dr. Bjorn Pippin

## 2022-06-20 NOTE — Telephone Encounter (Signed)
*  STAT* If patient is at the pharmacy, call can be transferred to refill team.   1. Which medications need to be refilled? (please list name of each medication and dose if known) amiodarone (PACERONE) 200 MG tablet   2. Which pharmacy/location (including street and city if local pharmacy) is medication to be sent to?[id number] - , [place] - [address] AT [facility] [address]   3. Do they need a 30 day or 90 day supply? 90 day  Patient is out of medication.

## 2022-07-08 ENCOUNTER — Other Ambulatory Visit (HOSPITAL_BASED_OUTPATIENT_CLINIC_OR_DEPARTMENT_OTHER): Payer: Medicare PPO

## 2022-07-08 NOTE — Telephone Encounter (Signed)
Not sure what form she is referring to-is it a preop clearance form?

## 2022-07-13 ENCOUNTER — Ambulatory Visit: Payer: Medicare PPO | Attending: Nurse Practitioner | Admitting: Nurse Practitioner

## 2022-07-13 ENCOUNTER — Encounter: Payer: Self-pay | Admitting: Nurse Practitioner

## 2022-07-13 VITALS — BP 132/80 | HR 94 | Ht 61.0 in | Wt 154.6 lb

## 2022-07-13 DIAGNOSIS — E785 Hyperlipidemia, unspecified: Secondary | ICD-10-CM | POA: Diagnosis not present

## 2022-07-13 DIAGNOSIS — I10 Essential (primary) hypertension: Secondary | ICD-10-CM | POA: Diagnosis not present

## 2022-07-13 DIAGNOSIS — I2510 Atherosclerotic heart disease of native coronary artery without angina pectoris: Secondary | ICD-10-CM

## 2022-07-13 DIAGNOSIS — I5022 Chronic systolic (congestive) heart failure: Secondary | ICD-10-CM

## 2022-07-13 DIAGNOSIS — I495 Sick sinus syndrome: Secondary | ICD-10-CM

## 2022-07-13 DIAGNOSIS — I480 Paroxysmal atrial fibrillation: Secondary | ICD-10-CM

## 2022-07-13 DIAGNOSIS — I255 Ischemic cardiomyopathy: Secondary | ICD-10-CM

## 2022-07-13 MED ORDER — NITROGLYCERIN 0.4 MG SL SUBL: 0.4000 mg | SUBLINGUAL_TABLET | SUBLINGUAL | 3 refills | Status: DC | PRN

## 2022-07-13 MED ORDER — CLOPIDOGREL BISULFATE 75 MG PO TABS: 75.0000 mg | ORAL_TABLET | Freq: Every day | ORAL | 3 refills | Status: DC

## 2022-07-13 MED ORDER — LOSARTAN POTASSIUM 25 MG PO TABS: 25.0000 mg | ORAL_TABLET | Freq: Every day | ORAL | 3 refills | Status: DC

## 2022-07-13 NOTE — Patient Instructions (Signed)
Medication Instructions:  Your physician recommends that you continue on your current medications as directed. Please refer to the Current Medication list given to you today.  *If you need a refill on your cardiac medications before your next appointment, please call your pharmacy*   Lab Work: NONE ordered at this time of appointment   If you have labs (blood work) drawn today and your tests are completely normal, you will receive your results only by: MyChart Message (if you have MyChart) OR A paper copy in the mail If you have any lab test that is abnormal or we need to change your treatment, we will call you to review the results.   Testing/Procedures: NONE ordered at this time of appointment     Follow-Up: At [facility], you and your health needs are our priority.  As part of our continuing mission to provide you with exceptional heart care, we have created designated Provider Care Teams.  These Care Teams include your primary Cardiologist (physician) and Advanced Practice Providers (APPs -  Physician Assistants and Nurse Practitioners) who all work together to provide you with the care you need, when you need it.  We recommend signing up for the patient portal called "MyChart".  Sign up information is provided on this After Visit Summary.  MyChart is used to connect with patients for Virtual Visits (Telemedicine).  Patients are able to view lab/test results, encounter notes, upcoming appointments, etc.  Non-urgent messages can be sent to your provider as well.   To learn more about what you can do with MyChart, go to [email or web address].    Your next appointment:   4 month(s)  The format for your next appointment:   In Person  Provider:   Little Ishikawa, MD     Other Instructions   Important Information About Sugar

## 2022-07-13 NOTE — Progress Notes (Signed)
Office Visit    Patient Name: Connie [name] Date of Encounter: 07/13/2022  Primary Care Provider:  Shon Hale, MD Primary Cardiologist:  Little Ishikawa, MD  Chief Complaint    81 year old female with a history of CAD, chronic systolic heart failure, ICM, paroxysmal atrial flutter, hypertension, hyperlipidemia, hypothyroidism, GERD, and psoriatic arthritis who presents for follow-up related to CAD, heart failure and atrial fibrillation.  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, noted on [place]   History of hiatal hernia    Hypertension    Hypothyroidism    Macular degeneration    Psoriatic arthritis (HCC)    Sinus drainage    UTI (urinary tract infection)    Past Surgical History:  Procedure Laterality Date   BACK SURGERY  2000   Dr Newell Coral   bladder tack  2005   COLONOSCOPY     CORONARY STENT INTERVENTION N/A 04/11/2022   Procedure: CORONARY STENT INTERVENTION;  Surgeon: Tonny Bollman, MD;  Location: [facility] INVASIVE CV LAB;  Service: Cardiovascular;  Laterality: N/A;   DILATION AND CURETTAGE OF UTERUS     after miscarriage   LEFT HEART CATH AND CORONARY ANGIOGRAPHY N/A 04/07/2022   Procedure: LEFT HEART CATH AND CORONARY ANGIOGRAPHY;  Surgeon: Tonny Bollman, MD;  Location: [facility] INVASIVE CV LAB;  Service: Cardiovascular;  Laterality: N/A;   PACEMAKER IMPLANT N/A 05/27/2022   Procedure: PACEMAKER IMPLANT;  Surgeon: Maurice Small, MD;  Location: [facility] INVASIVE CV LAB;  Service: Cardiovascular;  Laterality: N/A;   RECTAL PROLAPSE REPAIR  2008   SCAR REVISION Right 2017   Knee, Dr. Luiz Blare   TONSILLECTOMY     as a child   TOTAL KNEE ARTHROPLASTY Right 09/05/2014   Procedure: TOTAL KNEE ARTHROPLASTY;  Surgeon: Harvie Junior, MD;  Location: [facility] OR;  Service: Orthopedics;  Laterality: Right;   TOTAL KNEE  ARTHROPLASTY Left 01/10/2022   Procedure: TOTAL KNEE ARTHROPLASTY;  Surgeon: Jodi Geralds, MD;  Location: [facility] ORS;  Service: Orthopedics;  Laterality: Left;   TOTAL SHOULDER ARTHROPLASTY Left 11/17/2017   Procedure: LEFT TOTAL SHOULDER ARTHROPLASTY;  Surgeon: Jodi Geralds, MD;  Location: [facility] ORS;  Service: Orthopedics;  Laterality: Left;  with block   TUBAL LIGATION  1972    Allergies  Allergies  Allergen Reactions   Penicillins Rash and Other (See Comments)    Has patient had a PCN reaction causing immediate rash, facial/tongue/throat swelling, SOB or lightheadedness with hypotension: Yes Has patient had a PCN reaction causing severe rash involving mucus membranes or skin necrosis: No Has patient had a PCN reaction that required hospitalization: No Has patient had a PCN reaction occurring within the last 10 years: No If all of the above answers are "NO", then may proceed with Cephalosporin use.    Sulfa Antibiotics Rash    History of Present Illness    81 year old female with the above past medical history including CAD, chronic systolic heart failure, ICM, paroxysmal atrial flutter, hypertension, hyperlipidemia, hypothyroidism, GERD, and psoriatic arthritis.   She was referred to Dr. Bjorn Pippin in August 2022 in the setting of palpitations.  7-day Zio patch in 03/2021 revealed atrial flutter 2% burden), echocardiogram in 03/2021 showed normal biventricular function, no significant valvular disease.  Calcium score in 03/2021 was 604 (81st percentile). Lexiscan Myoview in 07/2021 showed no evidence of  ischemia, EF 79%.  She was hospitalized in 03/2022 in the setting of anterior STEMI.  LHC showed thrombotic occlusion to mLAD s/p DES.  She was noted to have severe mRCA disease and underwent staged PCI/DES-RCA on 04/11/2022.  Echocardiogram showed EF 35 to 40%, akinesis of LV, entire anterior wall, anteroseptal wall, apical segment, and a inferior apical segment, no LV apical thrombus, RV mildly reduced.   She was diuresed with IV Lasix and started on GDMT.  Her hospital course was complicated by atrial fibrillation with RVR with postconversion pauses up to 6 to 7 seconds in duration.  She was started on amiodarone.  EP was consulted.  PPM was deferred.  She is on Eliquis.  She last seen in the office on 05/02/2022 and was stable overall from a cardiac standpoint.  She did note some intermittent dizziness.  Outpatient cardiac monitor revealed significant pauses, atrial fibrillation/atrial flutter, tachycardia bradycardia syndrome.  She was again referred to EP.  She was ultimately hospitalized in November 2023 in the setting of tachybradycardia syndrome.  She underwent PPP implantation on 05/27/2022 with [employer] dual-chamber pacemaker.  Beta-blocker therapy was reinitiated.  Repeat echocardiogram showed EF 30 to 35%, akinesis of the anteroseptal, apical, distal inferior wall with overall moderate to severe LV dysfunction, G1 DD, trivial MR.  She was discharged home in stable condition on 05/28/2022.    She presents today for follow-accompanied by her husband.  Since her last visit she has done well from a cardiac standpoint.  She denies any dizziness, palpitations, presyncope, syncope, denies symptoms concerning for angina.  Her pacemaker site has healed well.  Overall, she reports feeling well.  Home Medications    Current Outpatient Medications  Medication Sig Dispense Refill   amiodarone (PACERONE) 200 MG tablet Take 1 tablet (200 mg total) by mouth daily. 90 tablet 0   apixaban (ELIQUIS) 5 MG TABS tablet Take 1 tablet (5 mg total) by mouth 2 (two) times daily. 60 tablet 3   atorvastatin (LIPITOR) 80 MG tablet Take 1 tablet (80 mg total) by mouth daily. 90 tablet 3   carvedilol (COREG) 6.25 MG tablet Take 1 tablet (6.25 mg total) by mouth 2 (two) times daily. 60 tablet 5   COVID-19 mRNA vaccine 2023-2024 (COMIRNATY) syringe Inject into the muscle. 0.3 mL 0   empagliflozin (JARDIANCE) 10 MG TABS tablet  Take 1 tablet (10 mg total) by mouth daily. 30 tablet 3   furosemide (LASIX) 20 MG tablet TAKE 1 TABLET BY MOUTH EVERY MONDAY, WEDNESDAY, FRIDAY 30 tablet 0   influenza vaccine adjuvanted (FLUAD) 0.5 ML injection Inject into the muscle. 0.5 mL 0   levothyroxine (SYNTHROID) 88 MCG tablet Take 88 mcg by mouth daily before breakfast.     sertraline (ZOLOFT) 100 MG tablet Take 100 mg by mouth daily.     clopidogrel (PLAVIX) 75 MG tablet Take 1 tablet (75 mg total) by mouth daily. 90 tablet 3   losartan (COZAAR) 25 MG tablet Take 1 tablet (25 mg total) by mouth daily. 90 tablet 3   nitroGLYCERIN (NITROSTAT) 0.4 MG SL tablet Place 1 tablet (0.4 mg total) under the tongue every 5 (five) minutes x 3 doses as needed for chest pain. 25 tablet 3   No current facility-administered medications for this visit.     Review of Systems    She denies chest pain, palpitations, dyspnea, pnd, orthopnea, n, v, dizziness, syncope, edema, weight gain, or early satiety. All other systems reviewed and are otherwise negative except as noted  above.    Cardiac Rehabilitation Eligibility Assessment  The patient is ready to start cardiac rehabilitation from a cardiac standpoint.    Physical Exam    VS:  BP 132/80   Pulse 94   Ht 5\' 1"  (1.549 m)   Wt 154 lb 9.6 oz (70.1 kg)   SpO2 97%   BMI 29.21 kg/m  STOP-Bang Score:  4      GEN: Well nourished, well developed, in no acute distress. HEENT: normal. Neck: Supple, no JVD, carotid bruits, or masses. Cardiac: RRR, no murmurs, rubs, or gallops. No clubbing, cyanosis, edema.  Radials/DP/PT 2+ and equal bilaterally.  Respiratory:  Respirations regular and unlabored, clear to auscultation bilaterally. GI: Soft, nontender, nondistended, BS + x 4. MS: no deformity or atrophy. Skin: warm and dry, no rash. Neuro:  Strength and sensation are intact. Psych: Normal affect.  Accessory Clinical Findings    ECG personally reviewed by me today -AV paced, 66 bpm, LAD, RBBB-  no acute changes.   Lab Results  Component Value Date   WBC 10.4 05/27/2022   HGB 11.3 (L) 05/27/2022   HCT 36.0 05/27/2022   MCV 86.3 05/27/2022   PLT 317 05/27/2022   Lab Results  Component Value Date   CREATININE 1.17 (H) 05/27/2022   BUN 14 05/27/2022   NA 138 05/27/2022   K 4.1 05/27/2022   CL 105 05/27/2022   CO2 23 05/27/2022   Lab Results  Component Value Date   ALT 17 05/06/2022   AST 23 05/06/2022   ALKPHOS 114 05/06/2022   BILITOT 0.6 05/06/2022   Lab Results  Component Value Date   CHOL 108 05/06/2022   HDL 44 05/06/2022   LDLCALC 46 05/06/2022   TRIG 91 05/06/2022   CHOLHDL 2.5 05/06/2022    Lab Results  Component Value Date   HGBA1C 5.6 04/07/2022    Assessment & Plan    1. CAD: S/p DES-mLAD, DES-RCA on 03/2022.  Echo showed EF 35 to 40%, akinesis of LV, entire anterior wall, anteroseptal wall, apical segment, and a inferior apical segment, no LV apical thrombus, RV mildly reduced.  Repeat echo showed EF 30 to 35%, akinesis of the anteroseptal, apical, distal inferior wall with overall moderate to severe LV dysfunction, G1 DD, trivial MR.  Stable with no anginal symptoms. Continue Plavix, carvedilol, losartan, Jardiance, Lasix, and Lipitor.    2. Chronic systolic heart failure/ICM: Most recent echo as above (EF 30-35%). Euvolemic and well compensated on exam.  Continue current medications as above.  If BP and potassium remains stable, could consider further escalation of GDMT at follow-up visit.  Consider repeat echocardiogram in 3 months.  For now, continue current medications as above.    3. 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  EKG today shows atrial paced rhythm.  Denies any palpitations, dizziness.  She has follow-up scheduled with EP in 08/2022.  Continue Eliquis, amiodarone, carvedilol.   4. Hypertension: BP well controlled, borderline soft at times. Continue current antihypertensive regimen.    5.  Hyperlipidemia: LDL was 46 in 04/2022.  Continue Lipitor.   6. Disposition: Follow-up in 10/2022 with Dr. 11/2022.     Bjorn Pippin, NP 07/13/2022, 12:16 PM

## 2022-07-14 DIAGNOSIS — M25562 Pain in left knee: Secondary | ICD-10-CM | POA: Diagnosis not present

## 2022-07-14 DIAGNOSIS — M67911 Unspecified disorder of synovium and tendon, right shoulder: Secondary | ICD-10-CM | POA: Diagnosis not present

## 2022-07-22 ENCOUNTER — Encounter: Payer: Self-pay | Admitting: Cardiology

## 2022-07-28 ENCOUNTER — Encounter: Payer: Self-pay | Admitting: Cardiology

## 2022-07-31 ENCOUNTER — Other Ambulatory Visit (HOSPITAL_COMMUNITY): Payer: Self-pay | Admitting: Cardiology

## 2022-08-08 DIAGNOSIS — H35033 Hypertensive retinopathy, bilateral: Secondary | ICD-10-CM | POA: Diagnosis not present

## 2022-08-08 DIAGNOSIS — H35433 Paving stone degeneration of retina, bilateral: Secondary | ICD-10-CM | POA: Diagnosis not present

## 2022-08-08 DIAGNOSIS — H43813 Vitreous degeneration, bilateral: Secondary | ICD-10-CM | POA: Diagnosis not present

## 2022-08-08 DIAGNOSIS — H353114 Nonexudative age-related macular degeneration, right eye, advanced atrophic with subfoveal involvement: Secondary | ICD-10-CM | POA: Diagnosis not present

## 2022-08-08 DIAGNOSIS — H353124 Nonexudative age-related macular degeneration, left eye, advanced atrophic with subfoveal involvement: Secondary | ICD-10-CM | POA: Diagnosis not present

## 2022-08-08 DIAGNOSIS — H35423 Microcystoid degeneration of retina, bilateral: Secondary | ICD-10-CM | POA: Diagnosis not present

## 2022-08-09 DIAGNOSIS — I495 Sick sinus syndrome: Secondary | ICD-10-CM | POA: Diagnosis not present

## 2022-08-09 DIAGNOSIS — F322 Major depressive disorder, single episode, severe without psychotic features: Secondary | ICD-10-CM | POA: Diagnosis not present

## 2022-08-09 DIAGNOSIS — K120 Recurrent oral aphthae: Secondary | ICD-10-CM | POA: Diagnosis not present

## 2022-08-09 DIAGNOSIS — I5022 Chronic systolic (congestive) heart failure: Secondary | ICD-10-CM | POA: Diagnosis not present

## 2022-08-09 DIAGNOSIS — I10 Essential (primary) hypertension: Secondary | ICD-10-CM | POA: Diagnosis not present

## 2022-08-09 DIAGNOSIS — E039 Hypothyroidism, unspecified: Secondary | ICD-10-CM | POA: Diagnosis not present

## 2022-08-09 DIAGNOSIS — L4050 Arthropathic psoriasis, unspecified: Secondary | ICD-10-CM | POA: Diagnosis not present

## 2022-08-22 ENCOUNTER — Telehealth (HOSPITAL_COMMUNITY): Payer: Self-pay

## 2022-08-22 NOTE — Telephone Encounter (Signed)
Called and confirmed Cardiac Rehab appointment. Also completed the CR Nursing Profile Assessment with the patient. Directions, instructions provided to the patient about the CR orientation process. Pt understands without assistance. 

## 2022-08-23 ENCOUNTER — Encounter (HOSPITAL_COMMUNITY): Payer: Self-pay

## 2022-08-23 ENCOUNTER — Encounter (HOSPITAL_COMMUNITY)
Admission: RE | Admit: 2022-08-23 | Discharge: 2022-08-23 | Disposition: A | Discharge status: Home / Self Care | Payer: Medicare PPO | Source: Ambulatory Visit | Attending: Cardiology | Admitting: Cardiology

## 2022-08-23 ENCOUNTER — Telehealth: Payer: Self-pay | Admitting: *Deleted

## 2022-08-23 VITALS — BP 132/70 | HR 69 | Ht 61.75 in | Wt 156.3 lb

## 2022-08-23 DIAGNOSIS — I213 ST elevation (STEMI) myocardial infarction of unspecified site: Secondary | ICD-10-CM | POA: Insufficient documentation

## 2022-08-23 DIAGNOSIS — Z955 Presence of coronary angioplasty implant and graft: Secondary | ICD-10-CM | POA: Insufficient documentation

## 2022-08-23 HISTORY — DX: Atherosclerotic heart disease of native coronary artery without angina pectoris: I25.10

## 2022-08-23 NOTE — Telephone Encounter (Signed)
Received call from cardiac rehab that patient was there and c/o a burning at device insertion site.  Patient came over to the office for [place] to assess site.  I looked at site which was without redness or drainage and did not feel any stitch that may have been causing irritation.  Dr. Lovena Le looked at site and felt all looked good.  Patient has a follow up appointment 2/5 and will keep that.  She was appreciative of [place] seeing her today.

## 2022-08-23 NOTE — Progress Notes (Signed)
Cardiac Rehab Medication Review by a Nurse  Does the patient  feel that his/her medications are working for him/her?  yes  Has the patient been experiencing any side effects to the medications prescribed?  no  Does the patient measure his/her own blood pressure or blood glucose at home?  yes   Does the patient have any problems obtaining medications due to transportation or finances?   no  Understanding of regimen: good Understanding of indications: good Potential of compliance: good    Nurse comments: Connie [name] is taking her medications as prescribed and has a good understanding of what her medications are for.  Connie [name] checks her blood pressures once a day.    Christa See [facility] RN 08/23/2022 11:29 AM

## 2022-08-23 NOTE — Progress Notes (Signed)
Patient reports that her pacemaker site is tender to touch. Incision is well healed. No drainage noted patient says that the pacer site is sore to touch. Upon palpation no tenderness noted. Patient said that there is some soreness where the 'wires are". Will notify Dr Karel Jarvis office. Patient also has a raised are on her left inner forearm that is the size of a nickle. The are is are is raised and hard to touch. I recommended that the patient follow up with her primary care provider Dr Lindell Noe regarding this. Patient and husband state understanding.Barnet Pall, RN,BSN 08/23/2022 11:02 AM

## 2022-08-23 NOTE — Progress Notes (Signed)
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  Patient's Home Medications on Admission:  Current Outpatient Medications:    amiodarone (PACERONE) 200 MG tablet, Take 1 tablet (200 mg total) by mouth daily., Disp: 90 tablet, Rfl: 0   apixaban (ELIQUIS) 5 MG TABS tablet, Take 1 tablet (5 mg total) by mouth 2 (two) times daily., Disp: 60 tablet, Rfl: 3   atorvastatin (LIPITOR) 80 MG tablet, Take 1 tablet (80 mg total) by mouth daily., Disp: 90 tablet, Rfl: 3   carvedilol (COREG) 6.25 MG tablet, Take 1 tablet (6.25 mg total) by mouth 2 (two) times daily., Disp: 60 tablet, Rfl: 5   clopidogrel (PLAVIX) 75 MG tablet, Take 1 tablet (75 mg total) by mouth daily., Disp: 90 tablet, Rfl: 3   empagliflozin (JARDIANCE) 10 MG TABS tablet, Take 1 tablet (10 mg total) by mouth daily., Disp: 30 tablet, Rfl: 3   furosemide (LASIX) 20 MG tablet, TAKE 1 TABLET BY MOUTH EVERY MONDAY, WEDNESDAY, AND FRIDAY., Disp: 30 tablet, Rfl: 0   levothyroxine (SYNTHROID) 88 MCG tablet, Take 88 mcg by mouth daily before breakfast., Disp: , Rfl:    losartan (COZAAR) 25 MG tablet, Take 1 tablet (25 mg total) by mouth daily., Disp: 90 tablet, Rfl: 3   nitroGLYCERIN (NITROSTAT) 0.4 MG SL tablet, Place 1 tablet (0.4 mg total) under the tongue every 5 (five) minutes x 3 doses as needed for chest pain., Disp: 25 tablet, Rfl: 3   sertraline (ZOLOFT) 100 MG tablet, Take 100 mg by mouth daily., Disp: , Rfl:    Past Medical History: 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   Multiple, noted on [place]   History of hiatal hernia    Hypertension    Hypothyroidism    Macular degeneration    Psoriatic arthritis (HCC)    Sinus drainage    UTI (urinary tract infection)     Tobacco Use: Social History   Tobacco Use  Smoking Status Never  Smokeless Tobacco Never    Labs: Review Flowsheet       Latest Ref Rng & Units 07/27/2021 04/07/2022 05/06/2022  Labs for ITP Cardiac and Pulmonary Rehab  Cholestrol 100 - 199 mg/dL 267  124  580   LDL (calc) 0 - 99 mg/dL 75  54  46   HDL-C >99 mg/dL 50  50  44   Trlycerides 0 - 149 mg/dL 833  825  91   Hemoglobin A1c 4.8 - 5.6 % - 5.6  -  TCO2 22 - 32 mmol/L - 23  -    Capillary Blood Glucose: Lab Results  Component Value Date   GLUCAP 165 (H) 04/08/2022   GLUCAP 137 (H) 04/08/2022     Exercise Target Goals: Exercise Program Goal: Individual exercise prescription set using results from initial 6  min walk test and THRR while considering  patient's activity barriers and safety.   Exercise Prescription Goal: Initial exercise prescription builds to 30-45 minutes a day of aerobic activity, 2-3 days per week.  Home exercise guidelines will be given to patient during program as part of exercise prescription that the participant will acknowledge.  Activity Barriers & Risk Stratification:  Activity Barriers & Cardiac Risk Stratification - 08/23/22 1137       Activity Barriers & Cardiac Risk Stratification   Activity Barriers Left Knee Replacement;Right Knee Replacement;Balance Concerns;History of Falls;Assistive Device;Arthritis;Joint Problems;Other (comment)    Comments Psoriatic arthritis causes joint pain, left shoulder pain- needs rotator cuff repair, macular degeneration, hx back surgery for herniated disc in  2000.    Cardiac Risk Stratification High             6 Minute Walk:  6 Minute Walk     Row Name 08/23/22 1155         6 Minute Walk   Phase Initial     Distance 881 feet     Walk Time 6 minutes     # of Rest Breaks 1  27 second rest to blow her nose     MPH 1.67     METS 1.63     RPE 13     Perceived Dyspnea  1     VO2 Peak 5.71     Symptoms Yes (comment)     Comments Mild SOB, RPD=1.     Resting HR 69 bpm     Resting BP 132/70     Resting Oxygen Saturation  99 %     Exercise Oxygen Saturation  during 6 min walk 96 %     Max Ex. HR 105 bpm     Max Ex. BP 168/82     2 Minute Post BP 156/82              Oxygen Initial Assessment:   Oxygen Re-Evaluation:   Oxygen Discharge (Final Oxygen Re-Evaluation):   Initial Exercise Prescription:  Initial Exercise Prescription - 08/23/22 1400       Date of Initial Exercise RX and Referring Provider   Date 08/23/22    Referring Provider Little Ishikawa, MD    Expected Discharge Date 10/21/22      NuStep   Level 1    SPM 70    Minutes 30    METs 1.6      Prescription Details   Frequency (times per week) 2    Duration Progress to 30 minutes of continuous aerobic without signs/symptoms of physical distress      Intensity   THRR 40-80% of Max Heartrate 55-110    Ratings of Perceived Exertion 11-13    Perceived Dyspnea 0-4      Progression   Progression Continue to progress workloads to maintain intensity without signs/symptoms of physical distress.      Resistance Training   Training Prescription Yes    Weight 2 lbs    Reps 10-15             Perform Capillary Blood Glucose checks as needed.  Exercise Prescription Changes:   Exercise Comments:   Exercise Goals and Review:   Exercise Goals     Row Name 08/23/22 1137             Exercise Goals   Increase Physical Activity Yes       Intervention Provide advice, education, support and counseling about physical  activity/exercise needs.;Develop an individualized exercise prescription for aerobic and resistive training based on initial evaluation findings, risk stratification, comorbidities and participant's personal goals.       Expected Outcomes Short Term: Attend rehab on a regular basis to increase amount of physical activity.;Long Term: Add in home exercise to make exercise part of routine and to increase amount of physical activity.;Long Term: Exercising regularly at least 3-5 days a week.       Increase Strength and Stamina Yes       Intervention Provide advice, education, support and counseling about physical activity/exercise needs.;Develop an individualized exercise prescription for aerobic and resistive training based on initial evaluation findings, risk stratification, comorbidities and participant's personal goals.       Expected Outcomes Long Term: Improve cardiorespiratory fitness, muscular endurance and strength as measured by increased METs and functional capacity ( );Short Term: Perform resistance training exercises routinely during rehab and add in resistance training at home;Short Term: Increase workloads from initial exercise prescription for resistance, speed, and METs.       Able to understand and use rate of perceived exertion (RPE) scale Yes       Intervention Provide education and explanation on how to use RPE scale       Expected Outcomes Short Term: Able to use RPE daily in rehab to express subjective intensity level;Long Term:  Able to use RPE to guide intensity level when exercising independently       Knowledge and understanding of Target Heart Rate Range (THRR) Yes       Intervention Provide education and explanation of THRR including how the numbers were predicted and where they are located for reference       Expected Outcomes Short Term: Able to state/look up THRR;Long Term: Able to use THRR to govern intensity when exercising independently;Short Term: Able to use daily as  guideline for intensity in rehab       Able to check pulse independently Yes       Intervention Provide education and demonstration on how to check pulse in carotid and radial arteries.;Review the importance of being able to check your own pulse for safety during independent exercise       Expected Outcomes Long Term: Able to check pulse independently and accurately;Short Term: Able to explain why pulse checking is important during independent exercise       Understanding of Exercise Prescription Yes       Intervention Provide education, explanation, and written materials on patient's individual exercise prescription       Expected Outcomes Short Term: Able to explain program exercise prescription;Long Term: Able to explain home exercise prescription to exercise independently                Exercise Goals Re-Evaluation :   Discharge Exercise Prescription (Final Exercise Prescription Changes):   Nutrition:  Target Goals: Understanding of nutrition guidelines, daily intake of sodium 1500mg , cholesterol 200mg , calories 30% from fat and 7% or less from saturated fats, daily to have 5 or more servings of fruits and vegetables.  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   Single Leg Stand  1.43 seconds              Nutrition Therapy Plan and Nutrition Goals:   Nutrition Assessments:  Nutrition Assessments - 08/23/22 1249       Rate Your Plate Scores   Pre Score 78            MEDIFICTS Score Key: ?70 Need to make dietary changes  40-70 Heart Healthy Diet ? 40 Therapeutic Level Cholesterol Diet   Flowsheet Row INTENSIVE CARDIAC REHAB ORIENT from 08/23/2022 in [facility]  Picture Your Plate Total Score on Admission 78      Picture Your Plate Scores: <82 Unhealthy dietary pattern with much room for improvement. 41-50 Dietary pattern unlikely to meet recommendations for good health and room for improvement. 51-60 More healthful dietary pattern, with some room for improvement.  >60 Healthy dietary pattern, although there may be some specific behaviors that could be improved.    Nutrition Goals Re-Evaluation:   Nutrition Goals Re-Evaluation:   Nutrition Goals Discharge (Final Nutrition Goals Re-Evaluation):   Psychosocial: Target Goals: Acknowledge presence or absence of significant depression and/or stress, maximize coping skills, provide positive support system. Participant is able to verbalize types and ability to use techniques and skills needed for reducing stress and depression.  Initial Review & Psychosocial Screening:  Initial Psych Review & Screening - 08/23/22 1426       Initial Review   Current issues with Current Depression      Family Dynamics   Good Support System? Yes   Connie [name] has her husband and two children for support     Barriers   Psychosocial barriers to participate in program The patient should benefit from training in stress management and relaxation.      Screening Interventions   Interventions Encouraged to exercise;To provide support and resources with identified psychosocial needs;Provide feedback about the scores to participant    Expected Outcomes Long Term Goal: Stressors or current issues are controlled or eliminated.;Long Term goal: The participant improves quality of Life and PHQ9 Scores as seen by post scores and/or verbalization of changes;Short Term goal: Identification and review with participant of any Quality of Life or Depression concerns found by scoring the questionnaire.             Quality of Life Scores:  Scores of 19 and below usually indicate a poorer quality of life in  these areas.  A difference of  2-3 points is a clinically meaningful difference.  A difference of 2-3 points in the total score of the Quality of Life Index has been associated with significant improvement in overall quality of life, self-image, physical symptoms, and general health in studies assessing change in quality of life.  PHQ-9: Review Flowsheet       08/23/2022  Depression screen PHQ 2/9  Decreased Interest 1  Down, Depressed, Hopeless 1  PHQ - 2 Score 2  Altered sleeping 0  Tired, decreased energy 1  Change in appetite 0  Trouble concentrating 1  Moving slowly or fidgety/restless 0  Suicidal thoughts 0  PHQ-9 Score 4  Difficult doing work/chores Somewhat difficult   Interpretation of Total Score  Total Score Depression Severity:  1-4 = Minimal depression, 5-9 = Mild depression, 10-14 = Moderate depression, 15-19 = Moderately severe depression, 20-27 = Severe depression   Psychosocial Evaluation and Intervention:   Psychosocial Re-Evaluation:   Psychosocial Discharge (Final Psychosocial Re-Evaluation):   Vocational Rehabilitation: Provide vocational rehab  assistance to qualifying candidates.   Vocational Rehab Evaluation & Intervention:  Vocational Rehab - 08/23/22 1430       Initial Vocational Rehab Evaluation & Intervention   Assessment shows need for Vocational Rehabilitation No   Connie [name] is retired and does not need vocational rehab at this time            Education: Education Goals: Education classes will be provided on a weekly basis, covering required topics. Participant will state understanding/return demonstration of topics presented.     Core Videos: Exercise    Move It!  Clinical staff conducted group or individual video education with verbal and written material and guidebook.  Patient learns the recommended Pritikin exercise program. Exercise with the goal of living a long, healthy life. Some of the health benefits of exercise include  controlled diabetes, healthier blood pressure levels, improved cholesterol levels, improved heart and lung capacity, improved sleep, and better body composition. Everyone should speak with their doctor before starting or changing an exercise routine.  Biomechanical Limitations Clinical staff conducted group or individual video education with verbal and written material and guidebook.  Patient learns how biomechanical limitations can impact exercise and how we can mitigate and possibly overcome limitations to have an impactful and balanced exercise routine.  Body Composition Clinical staff conducted group or individual video education with verbal and written material and guidebook.  Patient learns that body composition (ratio of muscle mass to fat mass) is a key component to assessing overall fitness, rather than body weight alone. Increased fat mass, especially visceral belly fat, can put [place] at increased risk for metabolic syndrome, type 2 diabetes, heart disease, and even death. It is recommended to combine diet and exercise (cardiovascular and resistance training) to improve your body composition. Seek guidance from your physician and exercise physiologist before implementing an exercise routine.  Exercise Action Plan Clinical staff conducted group or individual video education with verbal and written material and guidebook.  Patient learns the recommended strategies to achieve and enjoy long-term exercise adherence, including variety, self-motivation, self-efficacy, and positive decision making. Benefits of exercise include fitness, good health, weight management, more energy, better sleep, less stress, and overall well-being.  Medical   Heart Disease Risk Reduction Clinical staff conducted group or individual video education with verbal and written material and guidebook.  Patient learns our heart is our most vital organ as it circulates oxygen, nutrients, white blood cells, and hormones  throughout the entire body, and carries waste away. Data supports a plant-based eating plan like the Pritikin Program for its effectiveness in slowing progression of and reversing heart disease. The video provides a number of recommendations to address heart disease.   Metabolic Syndrome and Belly Fat  Clinical staff conducted group or individual video education with verbal and written material and guidebook.  Patient learns what metabolic syndrome is, how it leads to heart disease, and how one can reverse it and keep it from coming back. You have metabolic syndrome if you have 3 of the following 5 criteria: abdominal obesity, high blood pressure, high triglycerides, low HDL cholesterol, and high blood sugar.  Hypertension and Heart Disease Clinical staff conducted group or individual video education with verbal and written material and guidebook.  Patient learns that high blood pressure, or hypertension, is very common in the [place]. Hypertension is largely due to excessive salt intake, but other important risk factors include being overweight, physical inactivity, drinking too much alcohol, smoking, and not eating enough potassium from fruits and  vegetables. High blood pressure is a leading risk factor for heart attack, stroke, congestive heart failure, dementia, kidney failure, and premature death. Long-term effects of excessive salt intake include stiffening of the arteries and thickening of heart muscle and organ damage. Recommendations include ways to reduce hypertension and the risk of heart disease.  Diseases of Our Time - Focusing on Diabetes Clinical staff conducted group or individual video education with verbal and written material and guidebook.  Patient learns why the best way to stop diseases of our time is prevention, through food and other lifestyle changes. Medicine (such as prescription pills and surgeries) is often only a Band-Aid on the problem, not a long-term solution. Most  common diseases of our time include obesity, type 2 diabetes, hypertension, heart disease, and cancer. The Pritikin Program is recommended and has been proven to help reduce, reverse, and/or prevent the damaging effects of metabolic syndrome.  Nutrition   Overview of the Pritikin Eating Plan  Clinical staff conducted group or individual video education with verbal and written material and guidebook.  Patient learns about the Pritikin Eating Plan for disease risk reduction. The Pritikin Eating Plan emphasizes a wide variety of unrefined, minimally-processed carbohydrates, like fruits, vegetables, whole grains, and legumes. Go, Caution, and Stop food choices are explained. Plant-based and lean animal proteins are emphasized. Rationale provided for low sodium intake for blood pressure control, low added sugars for blood sugar stabilization, and low added fats and oils for coronary artery disease risk reduction and weight management.  Calorie Density  Clinical staff conducted group or individual video education with verbal and written material and guidebook.  Patient learns about calorie density and how it impacts the Pritikin Eating Plan. Knowing the characteristics of the food you choose will help you decide whether those foods will lead to weight gain or weight loss, and whether you want to consume more or less of them. Weight loss is usually a side effect of the Pritikin Eating Plan because of its focus on low calorie-dense foods.  Label Reading  Clinical staff conducted group or individual video education with verbal and written material and guidebook.  Patient learns about the Pritikin recommended label reading guidelines and corresponding recommendations regarding calorie density, added sugars, sodium content, and whole grains.  Dining Out - Part 1  Clinical staff conducted group or individual video education with verbal and written material and guidebook.  Patient learns that restaurant meals  can be sabotaging because they can be so high in calories, fat, sodium, and/or sugar. Patient learns recommended strategies on how to positively address this and avoid unhealthy pitfalls.  Facts on Fats  Clinical staff conducted group or individual video education with verbal and written material and guidebook.  Patient learns that lifestyle modifications can be just as effective, if not more so, as many medications for lowering your risk of heart disease. A Pritikin lifestyle can help to reduce your risk of inflammation and atherosclerosis (cholesterol build-up, or plaque, in the artery walls). Lifestyle interventions such as dietary choices and physical activity address the cause of atherosclerosis. A review of the types of fats and their impact on blood cholesterol levels, along with dietary recommendations to reduce fat intake is also included.  Nutrition Action Plan  Clinical staff conducted group or individual video education with verbal and written material and guidebook.  Patient learns how to incorporate Pritikin recommendations into their lifestyle. Recommendations include planning and keeping personal health goals in mind as an important part of their success.  Healthy Mind-Set    Healthy Minds, Bodies, Hearts  Clinical staff conducted group or individual video education with verbal and written material and guidebook.  Patient learns how to identify when they are stressed. Video will discuss the impact of that stress, as well as the many benefits of stress management. Patient will also be introduced to stress management techniques. The way we think, act, and feel has an impact on our hearts.  How Our Thoughts Can Heal Our Hearts  Clinical staff conducted group or individual video education with verbal and written material and guidebook.  Patient learns that negative thoughts can cause depression and anxiety. This can result in negative lifestyle behavior and serious health problems.  Cognitive behavioral therapy is an effective method to help control our thoughts in order to change and improve our emotional outlook.  Additional Videos:  Exercise    Improving Performance  Clinical staff conducted group or individual video education with verbal and written material and guidebook.  Patient learns to use a non-linear approach by alternating intensity levels and lengths of time spent exercising to help burn more calories and lose more body fat. Cardiovascular exercise helps improve heart health, metabolism, hormonal balance, blood sugar control, and recovery from fatigue. Resistance training improves strength, endurance, balance, coordination, reaction time, metabolism, and muscle mass. Flexibility exercise improves circulation, posture, and balance. Seek guidance from your physician and exercise physiologist before implementing an exercise routine and learn your capabilities and proper form for all exercise.  Introduction to Yoga  Clinical staff conducted group or individual video education with verbal and written material and guidebook.  Patient learns about yoga, a discipline of the coming together of mind, breath, and body. The benefits of yoga include improved flexibility, improved range of motion, better posture and core strength, increased lung function, weight loss, and positive self-image. Yoga's heart health benefits include lowered blood pressure, healthier heart rate, decreased cholesterol and triglyceride levels, improved immune function, and reduced stress. Seek guidance from your physician and exercise physiologist before implementing an exercise routine and learn your capabilities and proper form for all exercise.  Medical   Aging: Enhancing Your Quality of Life  Clinical staff conducted group or individual video education with verbal and written material and guidebook.  Patient learns key strategies and recommendations to stay in good physical health and enhance  quality of life, such as prevention strategies, having an advocate, securing a Health Care Proxy and Power of Attorney, and keeping a list of medications and system for tracking them. It also discusses how to avoid risk for bone loss.  Biology of Weight Control  Clinical staff conducted group or individual video education with verbal and written material and guidebook.  Patient learns that weight gain occurs because we consume more calories than we burn (eating more, moving less). Even if your body weight is normal, you may have higher ratios of fat compared to muscle mass. Too much body fat puts you at increased risk for cardiovascular disease, heart attack, stroke, type 2 diabetes, and obesity-related cancers. In addition to exercise, following the Pritikin Eating Plan can help reduce your risk.  Decoding Lab Results  Clinical staff conducted group or individual video education with verbal and written material and guidebook.  Patient learns that lab test reflects one measurement whose values change over time and are influenced by many factors, including medication, stress, sleep, exercise, food, hydration, pre-existing medical conditions, and more. It is recommended to use the knowledge from this video to become more  involved with your lab results and evaluate your numbers to speak with your doctor.   Diseases of Our Time - Overview  Clinical staff conducted group or individual video education with verbal and written material and guidebook.  Patient learns that according to the CDC, 50% to 70% of chronic diseases (such as obesity, type 2 diabetes, elevated lipids, hypertension, and heart disease) are avoidable through lifestyle improvements including healthier food choices, listening to satiety cues, and increased physical activity.  Sleep Disorders Clinical staff conducted group or individual video education with verbal and written material and guidebook.  Patient learns how good quality and  duration of sleep are important to overall health and well-being. Patient also learns about sleep disorders and how they impact health along with recommendations to address them, including discussing with a physician.  Nutrition  Dining Out - Part 2 Clinical staff conducted group or individual video education with verbal and written material and guidebook.  Patient learns how to plan ahead and communicate in order to maximize their dining experience in a healthy and nutritious manner. Included are recommended food choices based on the type of restaurant the patient is visiting.   Fueling a Banker conducted group or individual video education with verbal and written material and guidebook.  There is a strong connection between our food choices and our health. Diseases like obesity and type 2 diabetes are very prevalent and are in large-part due to lifestyle choices. The Pritikin Eating Plan provides plenty of food and hunger-curbing satisfaction. It is easy to follow, affordable, and helps reduce health risks.  Menu Workshop  Clinical staff conducted group or individual video education with verbal and written material and guidebook.  Patient learns that restaurant meals can sabotage health goals because they are often packed with calories, fat, sodium, and sugar. Recommendations include strategies to plan ahead and to communicate with the manager, chef, or server to help order a healthier meal.  Planning Your Eating Strategy  Clinical staff conducted group or individual video education with verbal and written material and guidebook.  Patient learns about the Pritikin Eating Plan and its benefit of reducing the risk of disease. The Pritikin Eating Plan does not focus on calories. Instead, it emphasizes high-quality, nutrient-rich foods. By knowing the characteristics of the foods, we choose, we can determine their calorie density and make informed decisions.  Targeting Your  Nutrition Priorities  Clinical staff conducted group or individual video education with verbal and written material and guidebook.  Patient learns that lifestyle habits have a tremendous impact on disease risk and progression. This video provides eating and physical activity recommendations based on your personal health goals, such as reducing LDL cholesterol, losing weight, preventing or controlling type 2 diabetes, and reducing high blood pressure.  Vitamins and Minerals  Clinical staff conducted group or individual video education with verbal and written material and guidebook.  Patient learns different ways to obtain key vitamins and minerals, including through a recommended healthy diet. It is important to discuss all supplements you take with your doctor.   Healthy Mind-Set    Smoking Cessation  Clinical staff conducted group or individual video education with verbal and written material and guidebook.  Patient learns that cigarette smoking and tobacco addiction pose a serious health risk which affects millions of people. Stopping smoking will significantly reduce the risk of heart disease, lung disease, and many forms of cancer. Recommended strategies for quitting are covered, including working with your doctor to develop a  successful plan.  Culinary   Becoming a Financial trader conducted group or individual video education with verbal and written material and guidebook.  Patient learns that cooking at home can be healthy, cost-effective, quick, and puts them in control. Keys to cooking healthy recipes will include looking at your recipe, assessing your equipment needs, planning ahead, making it simple, choosing cost-effective seasonal ingredients, and limiting the use of added fats, salts, and sugars.  Cooking - Breakfast and Snacks  Clinical staff conducted group or individual video education with verbal and written material and guidebook.  Patient learns how important  breakfast is to satiety and nutrition through the entire day. Recommendations include key foods to eat during breakfast to help stabilize blood sugar levels and to prevent overeating at meals later in the day. Planning ahead is also a key component.  Cooking - Human resources officer conducted group or individual video education with verbal and written material and guidebook.  Patient learns eating strategies to improve overall health, including an approach to cook more at home. Recommendations include thinking of animal protein as a side on your plate rather than center stage and focusing instead on lower calorie dense options like vegetables, fruits, whole grains, and plant-based proteins, such as beans. Making sauces in large quantities to freeze for later and leaving the skin on your vegetables are also recommended to maximize your experience.  Cooking - Healthy Salads and Dressing Clinical staff conducted group or individual video education with verbal and written material and guidebook.  Patient learns that vegetables, fruits, whole grains, and legumes are the foundations of the [place]. Recommendations include how to incorporate each of these in flavorful and healthy salads, and how to create homemade salad dressings. Proper handling of ingredients is also covered. Cooking - Soups and [employer] - Soups and Desserts Clinical staff conducted group or individual video education with verbal and written material and guidebook.  Patient learns that Pritikin soups and desserts make for easy, nutritious, and delicious snacks and meal components that are low in sodium, fat, sugar, and calorie density, while high in vitamins, minerals, and filling fiber. Recommendations include simple and healthy ideas for soups and desserts.   Overview     The Pritikin Solution Program Overview Clinical staff conducted group or individual video education with verbal and written material  and guidebook.  Patient learns that the results of the [place] Program have been documented in more than 100 articles published in peer-reviewed journals, and the benefits include reducing risk factors for (and, in some cases, even reversing) high cholesterol, high blood pressure, type 2 diabetes, obesity, and more! An overview of the three key pillars of the Pritikin Program will be covered: eating well, doing regular exercise, and having a healthy mind-set.  WORKSHOPS  Exercise: Exercise Basics: Building Your Action Plan Clinical staff led group instruction and group discussion with PowerPoint presentation and patient guidebook. To enhance the learning environment the use of posters, models and videos may be added. At the conclusion of this workshop, patients will comprehend the difference between physical activity and exercise, as well as the benefits of incorporating both, into their routine. Patients will understand the FITT (Frequency, Intensity, Time, and Type) principle and how to use it to build an exercise action plan. In addition, safety concerns and other considerations for exercise and cardiac rehab will be addressed by the presenter. The purpose of this lesson is to promote a comprehensive and effective weekly  exercise routine in order to improve patients' overall level of fitness.   Managing Heart Disease: Your Path to a Healthier Heart Clinical staff led group instruction and group discussion with PowerPoint presentation and patient guidebook. To enhance the learning environment the use of posters, models and videos may be added.At the conclusion of this workshop, patients will understand the anatomy and physiology of the heart. Additionally, they will understand how Pritikin's three pillars impact the risk factors, the progression, and the management of heart disease.  The purpose of this lesson is to provide a high-level overview of the heart, heart disease, and how the Pritikin  lifestyle positively impacts risk factors.  Exercise Biomechanics Clinical staff led group instruction and group discussion with PowerPoint presentation and patient guidebook. To enhance the learning environment the use of posters, models and videos may be added. Patients will learn how the structural parts of their bodies function and how these functions impact their daily activities, movement, and exercise. Patients will learn how to promote a neutral spine, learn how to manage pain, and identify ways to improve their physical movement in order to promote healthy living. The purpose of this lesson is to expose patients to common physical limitations that impact physical activity. Participants will learn practical ways to adapt and manage aches and pains, and to minimize their effect on regular exercise. Patients will learn how to maintain good posture while sitting, walking, and lifting.  Balance Training and Fall Prevention  Clinical staff led group instruction and group discussion with PowerPoint presentation and patient guidebook. To enhance the learning environment the use of posters, models and videos may be added. At the conclusion of this workshop, patients will understand the importance of their sensorimotor skills (vision, proprioception, and the vestibular system) in maintaining their ability to balance as they age. Patients will apply a variety of balancing exercises that are appropriate for their current level of function. Patients will understand the common causes for poor balance, possible solutions to these problems, and ways to modify their physical environment in order to minimize their fall risk. The purpose of this lesson is to teach patients about the importance of maintaining balance as they age and ways to minimize their risk of falling.  WORKSHOPS   Nutrition:  Fueling a Ship brokerHealthy Body Clinical staff led group instruction and group discussion with PowerPoint presentation  and patient guidebook. To enhance the learning environment the use of posters, models and videos may be added. Patients will review the foundational principles of the Pritikin Eating Plan and understand what constitutes a serving size in each of the food groups. Patients will also learn Pritikin-friendly foods that are better choices when away from home and review make-ahead meal and snack options. Calorie density will be reviewed and applied to three nutrition priorities: weight maintenance, weight loss, and weight gain. The purpose of this lesson is to reinforce (in a group setting) the key concepts around what patients are recommended to eat and how to apply these guidelines when away from home by planning and selecting Pritikin-friendly options. Patients will understand how calorie density may be adjusted for different weight management goals.  Mindful Eating  Clinical staff led group instruction and group discussion with PowerPoint presentation and patient guidebook. To enhance the learning environment the use of posters, models and videos may be added. Patients will briefly review the concepts of the Pritikin Eating Plan and the importance of low-calorie dense foods. The concept of mindful eating will be introduced as well as the  importance of paying attention to internal hunger signals. Triggers for non-hunger eating and techniques for dealing with triggers will be explored. The purpose of this lesson is to provide patients with the opportunity to review the basic principles of the [place], discuss the value of eating mindfully and how to measure internal cues of hunger and fullness using the Hunger Scale. Patients will also discuss reasons for non-hunger eating and learn strategies to use for controlling emotional eating.  Targeting Your Nutrition Priorities Clinical staff led group instruction and group discussion with PowerPoint presentation and patient guidebook. To enhance the  learning environment the use of posters, models and videos may be added. Patients will learn how to determine their genetic susceptibility to disease by reviewing their family history. Patients will gain insight into the importance of diet as part of an overall healthy lifestyle in mitigating the impact of genetics and other environmental insults. The purpose of this lesson is to provide patients with the opportunity to assess their personal nutrition priorities by looking at their family history, their own health history and current risk factors. Patients will also be able to discuss ways of prioritizing and modifying the [place] for their highest risk areas  Menu  Clinical staff led group instruction and group discussion with PowerPoint presentation and patient guidebook. To enhance the learning environment the use of posters, models and videos may be added. Using menus brought in from [employer], or printed from [employer], patients will apply the [place] dining out guidelines that were presented in the [employer] video. Patients will also be able to practice these guidelines in a variety of provided scenarios. The purpose of this lesson is to provide patients with the opportunity to practice hands-on learning of the [place] with actual menus and practice scenarios.  Label Reading Clinical staff led group instruction and group discussion with PowerPoint presentation and patient guidebook. To enhance the learning environment the use of posters, models and videos may be added. Patients will review and discuss the Pritikin label reading guidelines presented in Pritikin's Label Reading Educational series video. Using fool labels brought in from local grocery stores and markets, patients will apply the label reading guidelines and determine if the packaged food meet the Pritikin guidelines. The purpose of this lesson is to provide patients with  the opportunity to review, discuss, and practice hands-on learning of the Pritikin Label Reading guidelines with actual packaged food labels. [place] Workshops are designed to teach patients ways to prepare quick, simple, and affordable recipes at home. The importance of nutrition's role in chronic disease risk reduction is reflected in its emphasis in the overall Pritikin program. By learning how to prepare essential core Pritikin Eating Plan recipes, patients will increase control over what they eat; be able to customize the flavor of foods without the use of added salt, sugar, or fat; and improve the quality of the food they consume. By learning a set of core recipes which are easily assembled, quickly prepared, and affordable, patients are more likely to prepare more healthy foods at home. These workshops focus on convenient breakfasts, simple entres, side dishes, and desserts which can be prepared with minimal effort and are consistent with nutrition recommendations for cardiovascular risk reduction. Cooking [employer] are taught by a Engineer, materials (RD) who has been trained by the [employer]. The chef or RD has a clear understanding of the importance of  minimizing - if not completely eliminating - added fat, sugar, and sodium in recipes. Throughout the series of Cooking School Workshop sessions, patients will learn about healthy ingredients and efficient methods of cooking to build confidence in their capability to prepare    Cooking School weekly topics:  Adding Flavor- Sodium-Free  Fast and Healthy Breakfasts  Powerhouse Plant-Based Proteins  Satisfying Salads and Dressings  Simple Sides and Sauces  International Cuisine-Spotlight on the [employer] Zones  Delicious Desserts  Savory Soups  Efficiency Cooking - Meals in a Snap  Tasty Appetizers and Snacks  Comforting Weekend Breakfasts  One-Pot Wonders   Fast Evening  Meals  Easy Entertaining  Personalizing Your Pritikin Plate  WORKSHOPS   Healthy Mindset (Psychosocial): New Thoughts, New Behaviors Clinical staff led group instruction and group discussion with PowerPoint presentation and patient guidebook. To enhance the learning environment the use of posters, models and videos may be added. Patients will learn and practice techniques for developing effective health and lifestyle goals. Patients will be able to effectively apply the goal setting process learned to develop at least one new personal goal.  The purpose of this lesson is to expose patients to a new skill set of behavior modification techniques such as techniques setting SMART goals, overcoming barriers, and achieving new thoughts and new behaviors.  Managing Moods and Relationships Clinical staff led group instruction and group discussion with PowerPoint presentation and patient guidebook. To enhance the learning environment the use of posters, models and videos may be added. Patients will learn how emotional and chronic stress factors can impact their health and relationships. They will learn healthy ways to manage their moods and utilize positive coping mechanisms. In addition, ICR patients will learn ways to improve communication skills. The purpose of this lesson is to expose patients to ways of understanding how one's mood and health are intimately connected. Developing a healthy outlook can help build positive relationships and connections with others. Patients will understand the importance of utilizing effective communication skills that include actively listening and being heard. They will learn and understand the importance of the "4 Cs" and especially Connections in fostering of a Healthy Mind-Set.  Healthy Sleep for a Healthy Heart Clinical staff led group instruction and group discussion with PowerPoint presentation and patient guidebook. To enhance the learning environment the use of  posters, models and videos may be added. At the conclusion of this workshop, patients will be able to demonstrate knowledge of the importance of sleep to overall health, well-being, and quality of life. They will understand the symptoms of, and treatments for, common sleep disorders. Patients will also be able to identify daytime and nighttime behaviors which impact sleep, and they will be able to apply these tools to help manage sleep-related challenges. The purpose of this lesson is to provide patients with a general overview of sleep and outline the importance of quality sleep. Patients will learn about a few of the most common sleep disorders. Patients will also be introduced to the concept of "sleep hygiene," and discover ways to self-manage certain sleeping problems through simple daily behavior changes. Finally, the workshop will motivate patients by clarifying the links between quality sleep and their goals of heart-healthy living.   Recognizing and Reducing Stress Clinical staff led group instruction and group discussion with PowerPoint presentation and patient guidebook. To enhance the learning environment the use of posters, models and videos may be added. At the conclusion of this workshop, patients will be able to understand the types of  stress reactions, differentiate between acute and chronic stress, and recognize the impact that chronic stress has on their health. They will also be able to apply different coping mechanisms, such as reframing negative self-talk. Patients will have the opportunity to practice a variety of stress management techniques, such as deep abdominal breathing, progressive muscle relaxation, and/or guided imagery.  The purpose of this lesson is to educate patients on the role of stress in their lives and to provide healthy techniques for coping with it.  Learning Barriers/Preferences:  Learning Barriers/Preferences - 08/23/22 1428       Learning Barriers/Preferences    Learning Barriers Sight   wears glasses, impaired vision due to macular degeneration. Unsteady gait uses a cane   Learning Preferences Audio;Skilled Demonstration             Education Topics:  Knowledge Questionnaire Score:  Knowledge Questionnaire Score - 08/23/22 1417       Knowledge Questionnaire Score   Pre Score 20/24             Core Components/Risk Factors/Patient Goals at Admission:  Personal Goals and Risk Factors at Admission - 08/23/22 1137       Core Components/Risk Factors/Patient Goals on Admission    Weight Management Yes;Weight Maintenance    Intervention Weight Management: Develop a combined nutrition and exercise program designed to reach desired caloric intake, while maintaining appropriate intake of nutrient and fiber, sodium and fats, and appropriate energy expenditure required for the weight goal.;Weight Management: Provide education and appropriate resources to help participant work on and attain dietary goals.    Expected Outcomes Weight Maintenance: Understanding of the daily nutrition guidelines, which includes 25-35% calories from fat, 7% or less cal from saturated fats, less than 200mg  cholesterol, less than 1.5gm of sodium, & 5 or more servings of fruits and vegetables daily;Long Term: Adherence to nutrition and physical activity/exercise program aimed toward attainment of established weight goal;Short Term: Continue to assess and modify interventions until short term weight is achieved    Hypertension Yes    Intervention Provide education on lifestyle modifcations including regular physical activity/exercise, weight management, moderate sodium restriction and increased consumption of fresh fruit, vegetables, and low fat dairy, alcohol moderation, and smoking cessation.;Monitor prescription use compliance.    Expected Outcomes Short Term: Continued assessment and intervention until BP is < 140/98mm HG in hypertensive participants. < 130/63mm HG in  hypertensive participants with diabetes, heart failure or chronic kidney disease.;Long Term: Maintenance of blood pressure at goal levels.    Lipids Yes    Intervention Provide education and support for participant on nutrition & aerobic/resistive exercise along with prescribed medications to achieve LDL 70mg , HDL >40mg .    Expected Outcomes Short Term: Participant states understanding of desired cholesterol values and is compliant with medications prescribed. Participant is following exercise prescription and nutrition guidelines.;Long Term: Cholesterol controlled with medications as prescribed, with individualized exercise RX and with personalized nutrition plan. Value goals: LDL < 70mg , HDL > 40 mg.    Personal Goal Other Yes    Personal Goal Improve balance. Be able to walk church parking lot.    Intervention Provide individualized exercise prescription including aerobic, resistance, and stretching exercises to help build strength and endurance to increase stability and stamina to walk longer.    Expected Outcomes Patient will have improved balance as measured by single leg stand. Patient will be able to walk longer as measured by 6-minute walk test.  Core Components/Risk Factors/Patient Goals Review:    Core Components/Risk Factors/Patient Goals at Discharge (Final Review):    ITP Comments:  ITP Comments     Row Name 08/23/22 1020           ITP Comments Medical Director- Dr. Armanda Magic, MD. Introduction to Pritikin Education Program/ Intensive cardiac Rehab. Initial Orientation Packet Reviewed with the patient                Comments: Participant attended orientation for the cardiac rehabilitation program on  08/23/2022  to perform initial intake and exercise walk test. Patient introduced to the Pritikin Program education and orientation packet was reviewed. Completed 6-minute walk test, measurements, initial ITP, and exercise prescription. Vital signs stable.  Telemetry- paced, bundle branch block rhythm, mild SOB during 6-minute walk test, resolved with rest.   Service time was from 1020 to 1213.

## 2022-08-29 ENCOUNTER — Encounter: Payer: Self-pay | Admitting: Cardiovascular Disease

## 2022-08-29 ENCOUNTER — Ambulatory Visit (HOSPITAL_COMMUNITY): Payer: Medicare PPO

## 2022-08-29 ENCOUNTER — Ambulatory Visit: Payer: Medicare PPO | Attending: Cardiovascular Disease | Admitting: Cardiovascular Disease

## 2022-08-29 VITALS — BP 120/70 | HR 78 | Ht 61.75 in | Wt 156.0 lb

## 2022-08-29 DIAGNOSIS — I480 Paroxysmal atrial fibrillation: Secondary | ICD-10-CM | POA: Diagnosis not present

## 2022-08-29 NOTE — Progress Notes (Signed)
Electrophysiology Office Note:    Date:  08/29/2022   ID:  Connie [name], Connie [name] Sep 27, 1940, MRN [id number]  PCP:  Glenis Smoker, [facility] Cardiologist:  Donato Heinz, MD Electrophysiologist:  Melida Quitter, MD     Referring MD: Glenis Smoker, *   History of Present Illness:    Connie [name] is a 82 y.o. 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  She was admitted with an anterior STEMI and atrial fibrillation in September, 2023. She was managed with amiodarone. She was converted to sinus and discharged but returned about a month later with symptoms of dizziness. Heart monitor worn in the interim showed episodes of atrial fibrillation with post-conversion pauses.  she has no device related complaints -- no new tenderness, drainage, redness.     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   Multiple, noted on [place]   History of hiatal hernia    Hypertension    Hypothyroidism    Macular degeneration    Psoriatic arthritis ([place])    Sinus drainage    UTI (urinary tract infection)     Past Surgical History:  Procedure Laterality Date   BACK SURGERY  2000   Dr Sherwood Gambler   bladder tack  2005   COLONOSCOPY     CORONARY ANGIOPLASTY     CORONARY STENT INTERVENTION N/A 04/11/2022   Procedure: CORONARY STENT INTERVENTION;  Surgeon: Sherren Mocha, MD;  Location: [facility];  Service: Cardiovascular;  Laterality: N/A;   DILATION AND CURETTAGE OF UTERUS     after miscarriage   INSERT / REPLACE / REMOVE PACEMAKER     LEFT HEART CATH AND CORONARY ANGIOGRAPHY N/A 04/07/2022   Procedure: LEFT HEART CATH AND CORONARY ANGIOGRAPHY;  Surgeon: Sherren Mocha, MD;  Location: [facility];  Service: Cardiovascular;  Laterality: N/A;   PACEMAKER IMPLANT N/A 05/27/2022   Procedure: PACEMAKER IMPLANT;  Surgeon: Melida Quitter, MD;  Location: [facility];  Service: Cardiovascular;  Laterality: N/A;   RECTAL PROLAPSE REPAIR  2008   SCAR REVISION Right 2017   Knee, Dr. Berenice Primas   TONSILLECTOMY     as a child   TOTAL KNEE ARTHROPLASTY Right 09/05/2014   Procedure: TOTAL KNEE ARTHROPLASTY;  Surgeon: Alta Corning, MD;  Location: [facility];  Service: Orthopedics;  Laterality: Right;   TOTAL KNEE ARTHROPLASTY Left 01/10/2022   Procedure: TOTAL KNEE ARTHROPLASTY;  Surgeon: Dorna Leitz, MD;  Location: [facility];  Service: Orthopedics;  Laterality: Left;   TOTAL SHOULDER ARTHROPLASTY Left 11/17/2017   Procedure: LEFT TOTAL SHOULDER ARTHROPLASTY;  Surgeon: Dorna Leitz, MD;  Location: [facility];  Service: Orthopedics;  Laterality: Left;  with block   TUBAL LIGATION  1972    Current Medications: Current Meds  Medication Sig   amiodarone (PACERONE) 200 MG tablet Take 1 tablet (200 mg total) by mouth daily.   apixaban (ELIQUIS) 5 MG TABS tablet Take 1 tablet (5 mg total) by mouth 2 (two) times daily.   atorvastatin (LIPITOR) 80 MG tablet Take 1 tablet (80 mg total) by mouth daily.   carvedilol (COREG) 6.25 MG tablet Take 1 tablet (6.25 mg total) by mouth 2 (two) times daily.  clopidogrel (PLAVIX) 75 MG tablet Take 1 tablet (75 mg total) by mouth daily.   empagliflozin (JARDIANCE) 10 MG TABS tablet Take 1 tablet (10 mg total) by mouth daily.   furosemide (LASIX) 20 MG tablet TAKE 1 TABLET BY MOUTH EVERY MONDAY, WEDNESDAY, AND FRIDAY.   levothyroxine (SYNTHROID) 88 MCG tablet Take 88 mcg by mouth daily before breakfast.   losartan (COZAAR) 25 MG tablet Take 1 tablet (25 mg total) by mouth daily.   nitroGLYCERIN (NITROSTAT) 0.4 MG SL tablet Place 1 tablet (0.4 mg total) under the tongue every 5 (five) minutes x 3 doses as needed for chest pain.   sertraline (ZOLOFT) 100 MG  tablet Take 100 mg by mouth daily.     Allergies:   Penicillins and Sulfa antibiotics   Social History   Socioeconomic History   Marital status: Married    Spouse name: Barnabas Lister   Number of children: 2   Years of education: Not on file   Highest education level: Associate degree: academic program  Occupational History   Occupation: Retired  Tobacco Use   Smoking status: Never   Smokeless tobacco: Never  Vaping Use   Vaping Use: Never used  Substance and Sexual Activity   Alcohol use: Yes    Comment: rarely wine   Drug use: No   Sexual activity: Not on file  Other Topics Concern   Not on file  Social History Narrative   Lives at home with husband.  Son and daughter.    Social Determinants of Health   Financial Resource Strain: Low Risk  (04/11/2022)   Overall Financial Resource Strain (CARDIA)    Difficulty of Paying Living Expenses: Not hard at all  Food Insecurity: No Food Insecurity (05/27/2022)   Hunger Vital Sign    Worried About Running Out of Food in the Last Year: Never true    Ran Out of Food in the Last Year: Never true  Transportation Needs: No Transportation Needs (05/27/2022)   PRAPARE - Hydrologist (Medical): No    Lack of Transportation (Non-Medical): No  Physical Activity: Not on file  Stress: Not on file  Social Connections: Not on file     Family History: The patient's family history includes Cancer - Lung in her mother; Heart attack (age of onset: 52) in her father. There is no history of Breast cancer.  ROS:   Please see the history of present illness.    All other systems reviewed and are negative.  EKGs/Labs/Other Studies Reviewed Today:     EKG:  Last EKG results: A-paced, RBBB   Recent Labs: 04/08/2022: TSH 0.773 04/20/2022: B Natriuretic Peptide 925.5 05/06/2022: ALT 17 05/26/2022: Magnesium 2.0 05/27/2022: BUN 14; Creatinine, Ser 1.17; Hemoglobin 11.3; Platelets 317; Potassium 4.1; Sodium 138     Physical  Exam:    VS:  BP 120/70   Pulse 78   Ht 5' 1.75" (1.568 m)   Wt 156 lb (70.8 kg)   SpO2 98%   BMI 28.76 kg/m     Wt Readings from Last 3 Encounters:  08/29/22 156 lb (70.8 kg)  08/23/22 156 lb 4.9 oz (70.9 kg)  07/13/22 154 lb 9.6 oz (70.1 kg)     GEN: Well nourished, well developed in no acute distress CARDIAC: RRR, no murmurs, rubs, gallops RESPIRATORY:  Normal work of breathing MUSCULOSKELETAL: no edema    ASSESSMENT & PLAN:    Sinus bradycardia Medtronic dual chamber pacemaker in place Atrial fibrillation:  very  low burden, longest episode was about 2 minutes. Continue amiodarone for now.  Secondary hypercoagulable state: continue apixaban      Medication Adjustments/Labs and Tests Ordered: Current medicines are reviewed at length with the patient today.  Concerns regarding medicines are outlined above.  No orders of the defined types were placed in this encounter.  No orders of the defined types were placed in this encounter.    Signed, Melida Quitter, MD  08/29/2022 2:47 PM    [place]

## 2022-08-29 NOTE — Patient Instructions (Signed)
Medication Instructions:  Your physician recommends that you continue on your current medications as directed. Please refer to the Current Medication list given to you today.  *If you need a refill on your cardiac medications before your next appointment, please call your pharmacy*  Lab Work: None ordered  Testing/Procedures: None ordered  Follow-Up: At [facility], you and your health needs are our priority.  As part of our continuing mission to provide you with exceptional heart care, we have created designated Provider Care Teams.  These Care Teams include your primary Cardiologist (physician) and Advanced Practice Providers (APPs -  Physician Assistants and Nurse Practitioners) who all work together to provide you with the care you need, when you need it.  Your next appointment:   1 year(s)  The format for your next appointment:   In Person  Provider:   You may see Augustus E Mealor, MD or one of the following Advanced Practice Providers on your designated Care Team:   Renee Ursuy, PA-C Michael "Andy" Tillery, PA-C Suzann Riddle, NP{ 

## 2022-08-31 ENCOUNTER — Ambulatory Visit (HOSPITAL_COMMUNITY): Payer: Medicare PPO

## 2022-09-02 ENCOUNTER — Ambulatory Visit (HOSPITAL_COMMUNITY): Payer: Medicare PPO

## 2022-09-04 ENCOUNTER — Other Ambulatory Visit: Payer: Self-pay | Admitting: Nurse Practitioner

## 2022-09-05 ENCOUNTER — Ambulatory Visit (HOSPITAL_COMMUNITY): Payer: Medicare PPO

## 2022-09-05 NOTE — Telephone Encounter (Signed)
Pt last saw Dr Myles Gip 08/29/22, last labs 05/27/22 Creat 1.17, age 82, weight 70.8kg, based on specified criteria pt is on appropriate dosage of Eliquis 22m BID for afib.  Will refill rx.

## 2022-09-06 DIAGNOSIS — I721 Aneurysm of artery of upper extremity: Secondary | ICD-10-CM | POA: Diagnosis not present

## 2022-09-07 ENCOUNTER — Ambulatory Visit (HOSPITAL_COMMUNITY): Payer: Medicare PPO

## 2022-09-07 ENCOUNTER — Encounter: Payer: Medicare PPO | Attending: Cardiology

## 2022-09-07 DIAGNOSIS — Z955 Presence of coronary angioplasty implant and graft: Secondary | ICD-10-CM | POA: Insufficient documentation

## 2022-09-07 DIAGNOSIS — I255 Ischemic cardiomyopathy: Secondary | ICD-10-CM | POA: Diagnosis not present

## 2022-09-07 DIAGNOSIS — I213 ST elevation (STEMI) myocardial infarction of unspecified site: Secondary | ICD-10-CM | POA: Insufficient documentation

## 2022-09-07 LAB — CUP PACEART REMOTE DEVICE CHECK
Battery Remaining Longevity: 163 mo
Battery Voltage: 3.2 V
Brady Statistic AP VP Percent: 0.06 %
Brady Statistic AP VS Percent: 86.6 %
Brady Statistic AS VP Percent: 0.01 %
Brady Statistic AS VS Percent: 13.32 %
Brady Statistic RA Percent Paced: 86.68 %
Brady Statistic RV Percent Paced: 0.08 %
Date Time Interrogation Session: 20240213221223
Implantable Lead Connection Status: 753985
Implantable Lead Connection Status: 753985
Implantable Lead Implant Date: 20231103
Implantable Lead Implant Date: 20231103
Implantable Lead Location: 753859
Implantable Lead Location: 753860
Implantable Lead Model: 3830
Implantable Lead Model: 5076
Implantable Pulse Generator Implant Date: 20231103
Lead Channel Impedance Value: 304 Ohm
Lead Channel Impedance Value: 342 Ohm
Lead Channel Impedance Value: 380 Ohm
Lead Channel Impedance Value: 570 Ohm
Lead Channel Pacing Threshold Amplitude: 0.5 V
Lead Channel Pacing Threshold Amplitude: 1 V
Lead Channel Pacing Threshold Pulse Width: 0.4 ms
Lead Channel Pacing Threshold Pulse Width: 0.4 ms
Lead Channel Sensing Intrinsic Amplitude: 2.25 mV
Lead Channel Sensing Intrinsic Amplitude: 2.25 mV
Lead Channel Sensing Intrinsic Amplitude: 3.625 mV
Lead Channel Sensing Intrinsic Amplitude: 3.625 mV
Lead Channel Setting Pacing Amplitude: 1.5 V
Lead Channel Setting Pacing Amplitude: 2 V
Lead Channel Setting Pacing Pulse Width: 0.4 ms
Lead Channel Setting Sensing Sensitivity: 0.6 mV
Zone Setting Status: 755011

## 2022-09-09 ENCOUNTER — Ambulatory Visit (HOSPITAL_COMMUNITY): Payer: Medicare PPO

## 2022-09-12 ENCOUNTER — Ambulatory Visit (HOSPITAL_COMMUNITY)
Admission: RE | Admit: 2022-09-12 | Discharge: 2022-09-12 | Disposition: A | Discharge status: Home / Self Care | Payer: Medicare PPO | Source: Ambulatory Visit | Attending: Vascular Surgery | Admitting: Vascular Surgery

## 2022-09-12 ENCOUNTER — Ambulatory Visit: Payer: Medicare PPO | Admitting: Vascular Surgery

## 2022-09-12 ENCOUNTER — Other Ambulatory Visit (HOSPITAL_COMMUNITY): Payer: Self-pay | Admitting: Vascular Surgery

## 2022-09-12 ENCOUNTER — Ambulatory Visit (HOSPITAL_COMMUNITY): Payer: Medicare PPO

## 2022-09-12 ENCOUNTER — Encounter: Payer: Self-pay | Admitting: Vascular Surgery

## 2022-09-12 VITALS — BP 113/56 | HR 68 | Temp 97.7°F | Resp 20 | Ht 62.0 in | Wt 156.0 lb

## 2022-09-12 DIAGNOSIS — I729 Aneurysm of unspecified site: Secondary | ICD-10-CM

## 2022-09-12 DIAGNOSIS — I721 Aneurysm of artery of upper extremity: Secondary | ICD-10-CM

## 2022-09-12 NOTE — Progress Notes (Signed)
[facility] [place]  ASSESSMENT / PLAN: 82 y.o. 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  The radial artery is dominant on formal Allen's testing, suggesting an incomplete palmar arch.  The artery will need to be repaired directly, as the neck is too short to permit direct thrombin injection.  I suspect the artery can be repaired primarily, but I will prepare to harvest saphenous vein in case a interposition end-to-end bypass is needed to repair this dominant artery.  I will refer her to a hand specialist to see if this pseudoaneurysm is indeed causing compressive symptoms, or she has some other compressive pathology (e.g. carpal tunnel syndrome).  Will need preoperative risk evaluation by her cardiologist.  Will need to hold her Eliquis preoperatively.  I will continue her dual antiplatelet therapy through the case given her recent coronary stenting.  Will call her in about a month after the above is completed to discuss options for repair.  CHIEF COMPLAINT: Right wrist aneurysm  HISTORY OF PRESENT ILLNESS: Connie [name] is a 82 y.o. 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  Patient's primary care provider, Dr. Lindell Noe, noted this in clinic and referred her urgently for evaluation.  The patient underwent cardiac catheterization through right radial approach in the fall 2023.  This has been present since that time.  She reports she had significant bruising after the catheterization which resolved.  She does not report slow growth of the pulsatile mass in her right wrist.  She has no embolic symptoms.  She does report some tingling and paresthesias in her right hand, but wonders if this is from carpal tunnel, as she likes to use her computer a lot.  Past Medical History:  Diagnosis Date   Arthritis    Coronary artery disease    Depression    Dysrhythmia    GERD (gastroesophageal  reflux disease)    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   Multiple, noted on [place]   History of hiatal hernia    Hypertension    Hypothyroidism    Macular degeneration    Psoriatic arthritis ([place])    Sinus drainage    UTI (urinary tract infection)     Past Surgical History:  Procedure Laterality Date   BACK SURGERY  2000   Dr Sherwood Gambler   bladder tack  2005   COLONOSCOPY     CORONARY ANGIOPLASTY     CORONARY STENT INTERVENTION N/A 04/11/2022   Procedure: CORONARY STENT INTERVENTION;  Surgeon: Sherren Mocha, MD;  Location: [facility];  Service: Cardiovascular;  Laterality: N/A;   DILATION AND CURETTAGE OF UTERUS     after miscarriage   INSERT / REPLACE / REMOVE PACEMAKER     LEFT HEART CATH AND CORONARY ANGIOGRAPHY N/A 04/07/2022   Procedure: LEFT HEART CATH AND CORONARY ANGIOGRAPHY;  Surgeon: Sherren Mocha, MD;  Location: [facility];  Service: Cardiovascular;  Laterality: N/A;   PACEMAKER IMPLANT N/A 05/27/2022   Procedure: PACEMAKER IMPLANT;  Surgeon: Melida Quitter, MD;  Location: [facility];  Service: Cardiovascular;  Laterality: N/A;   RECTAL PROLAPSE REPAIR  2008   SCAR REVISION Right 2017   Knee, Dr. Berenice Primas   TONSILLECTOMY     as a child   TOTAL KNEE ARTHROPLASTY Right 09/05/2014   Procedure: TOTAL KNEE ARTHROPLASTY;  Surgeon: Alta Corning, MD;  Location: [facility];  Service: Orthopedics;  Laterality: Right;   TOTAL KNEE  ARTHROPLASTY Left 01/10/2022   Procedure: TOTAL KNEE ARTHROPLASTY;  Surgeon: Dorna Leitz, MD;  Location: [facility];  Service: Orthopedics;  Laterality: Left;   TOTAL SHOULDER ARTHROPLASTY Left 11/17/2017   Procedure: LEFT TOTAL SHOULDER ARTHROPLASTY;  Surgeon: Dorna Leitz, MD;  [facility];  Service: Orthopedics;  Laterality: Left;  with block   TUBAL LIGATION  1972    Family History  Problem Relation Age of Onset   Cancer - Lung Mother    Heart attack Father 86   Breast cancer Neg Hx     Social History   Socioeconomic History    Marital status: Married    Spouse name: Barnabas Lister   Number of children: 2   Years of education: Not on file   Highest education level: Associate degree: academic program  Occupational History   Occupation: Retired  Tobacco Use   Smoking status: Never   Smokeless tobacco: Never  Vaping Use   Vaping Use: Never used  Substance and Sexual Activity   Alcohol use: Yes    Comment: rarely wine   Drug use: No   Sexual activity: Not on file  Other Topics Concern   Not on file  Social History Narrative   Lives at home with husband.  Son and daughter.    Social Determinants of Health   Financial Resource Strain: Low Risk  (04/11/2022)   Overall Financial Resource Strain (CARDIA)    Difficulty of Paying Living Expenses: Not hard at all  Food Insecurity: No Food Insecurity (05/27/2022)   Hunger Vital Sign    Worried About Running Out of Food in the Last Year: Never true    Ran Out of Food in the Last Year: Never true  Transportation Needs: No Transportation Needs (05/27/2022)   PRAPARE - Hydrologist (Medical): No    Lack of Transportation (Non-Medical): No  Physical Activity: Not on file  Stress: Not on file  Social Connections: Not on file  Intimate Partner Violence: Not At Risk (05/27/2022)   Humiliation, Afraid, Rape, and Kick questionnaire    Fear of Current or Ex-Partner: No    Emotionally Abused: No    Physically Abused: No    Sexually Abused: No    Allergies  Allergen Reactions   Penicillins Rash and Other (See Comments)    Has patient had a PCN reaction causing immediate rash, facial/tongue/throat swelling, SOB or lightheadedness with hypotension: Yes Has patient had a PCN reaction causing severe rash involving mucus membranes or skin necrosis: No Has patient had a PCN reaction that required hospitalization: No Has patient had a PCN reaction occurring within the last 10 years: No If all of the above answers are "NO", then may proceed with  Cephalosporin use.    Sulfa Antibiotics Rash    Current Outpatient Medications  Medication Sig Dispense Refill   amiodarone (PACERONE) 200 MG tablet Take 1 tablet (200 mg total) by mouth daily. 90 tablet 0   atorvastatin (LIPITOR) 80 MG tablet Take 1 tablet (80 mg total) by mouth daily. 90 tablet 3   carvedilol (COREG) 6.25 MG tablet Take 1 tablet (6.25 mg total) by mouth 2 (two) times daily. 60 tablet 5   clopidogrel (PLAVIX) 75 MG tablet Take 1 tablet (75 mg total) by mouth daily. 90 tablet 3   ELIQUIS 5 MG TABS tablet TAKE 1 TABLET(5 MG) BY MOUTH TWICE DAILY 60 tablet 3   empagliflozin (JARDIANCE) 10 MG TABS tablet TAKE 1 TABLET(10 MG) BY MOUTH DAILY 30  tablet 10   furosemide (LASIX) 20 MG tablet TAKE 1 TABLET BY MOUTH EVERY MONDAY, WEDNESDAY, AND FRIDAY. 30 tablet 0   levothyroxine (SYNTHROID) 88 MCG tablet Take 88 mcg by mouth daily before breakfast.     losartan (COZAAR) 25 MG tablet Take 1 tablet (25 mg total) by mouth daily. 90 tablet 3   nitroGLYCERIN (NITROSTAT) 0.4 MG SL tablet Place 1 tablet (0.4 mg total) under the tongue every 5 (five) minutes x 3 doses as needed for chest pain. 25 tablet 3   sertraline (ZOLOFT) 100 MG tablet Take 100 mg by mouth daily.     No current facility-administered medications for this visit.    PHYSICAL EXAM Vitals:   09/12/22 0928  BP: (!) 113/56  Pulse: 68  Resp: 20  Temp: 97.7 F (36.5 C)  SpO2: 99%  Weight: 156 lb (70.8 kg)  Height: 5' 2"$  (1.575 m)   Wearing elderly woman in no acute distress Regular rate and rhythm Unlabored breathing Palpable pulsatile mass in the right wrist over the course of the radial artery.  There is a palpable pulse in the radial artery proximal and distal to this mass.  There is a palpable ulnar artery.  There is a brisk palmar arch signal which attenuated significantly with compression of the radial artery.   PERTINENT LABORATORY AND RADIOLOGIC DATA  Most recent CBC    Latest Ref Rng & Units 05/27/2022     3:55 AM 05/26/2022    3:47 PM 04/20/2022    8:45 AM  CBC  WBC 4.0 - 10.5 K/uL 10.4  10.7  18.1   Hemoglobin 12.0 - 15.0 g/dL 11.3  12.3  9.6   Hematocrit 36.0 - 46.0 % 36.0  38.7  30.3   Platelets 150 - 400 K/uL 317  370  565      Most recent CMP    Latest Ref Rng & Units 05/27/2022    3:55 AM 05/26/2022    3:47 PM 05/20/2022    9:59 AM  CMP  Glucose 70 - 99 mg/dL 86  96  117   BUN 8 - 23 mg/dL 14  14  16   $ Creatinine 0.44 - 1.00 mg/dL 1.17  1.18  1.05   Sodium 135 - 145 mmol/L 138  136  137   Potassium 3.5 - 5.1 mmol/L 4.1  4.3  4.4   Chloride 98 - 111 mmol/L 105  104  100   CO2 22 - 32 mmol/L 23  21  19   $ Calcium 8.9 - 10.3 mg/dL 8.9  9.0  9.0     Renal function CrCl cannot be calculated (Patient's most recent lab result is older than the maximum 21 days allowed.).  Hgb A1c MFr Bld (%)  Date Value  04/07/2022 5.6    LDL Chol Calc (NIH)  Date Value Ref Range Status  05/06/2022 46 0 - 99 mg/dL Final    Right Allen's test- signal decreases >50% with radial artery compression,  is unaffected with ulnar compression.    Right: No obstruction visualized in the right upper extremity         Sonographic characteristics of a pseudoaneurysm is visualized         at the distal radial artery measuring 1.6cm x 1.2cm x 1.7cm.  *See table(s) above for measurements and observations.   Yevonne Aline. Stanford Breed, MD [facility] Phone Number: [phone] 09/12/2022 12:02 PM   Total time spent on preparing this  encounter including chart review, data review, collecting history, examining the patient, coordinating care for this new patient, 60 minutes.  Portions of this report may have been transcribed using voice recognition software.  Every effort has been made to ensure accuracy; however, inadvertent computerized transcription errors may still be present.

## 2022-09-14 ENCOUNTER — Ambulatory Visit (HOSPITAL_COMMUNITY): Payer: Medicare PPO

## 2022-09-16 ENCOUNTER — Other Ambulatory Visit: Payer: Self-pay | Admitting: Cardiology

## 2022-09-16 ENCOUNTER — Ambulatory Visit (HOSPITAL_COMMUNITY): Payer: Medicare PPO

## 2022-09-16 ENCOUNTER — Telehealth: Payer: Self-pay | Admitting: Cardiology

## 2022-09-16 NOTE — Telephone Encounter (Signed)
Patient stated she is returning a RN's call she thinks is regarding her recent pseudoaneurysm diagnosis.

## 2022-09-16 NOTE — Telephone Encounter (Signed)
Returned call to patient, she reports nurse called her from vascular office.   # provided to [facility] to return call.

## 2022-09-19 ENCOUNTER — Ambulatory Visit (HOSPITAL_COMMUNITY): Payer: Medicare PPO

## 2022-09-19 NOTE — Telephone Encounter (Signed)
Spoke with the patient. I am unsure who tried to call her. She said they left a message but she could not understand it. Advised that if anything important was needed they would reach back out to her.

## 2022-09-19 NOTE — Telephone Encounter (Signed)
Patient was returning call. Please advise  

## 2022-09-20 ENCOUNTER — Telehealth (HOSPITAL_COMMUNITY): Payer: Self-pay | Admitting: *Deleted

## 2022-09-20 DIAGNOSIS — E039 Hypothyroidism, unspecified: Secondary | ICD-10-CM | POA: Diagnosis not present

## 2022-09-20 NOTE — Telephone Encounter (Signed)
Spoke with Connie [name] they have pluming issues at home. No toilet or running water. Cardiac rehab is not a feasible option at the time. [name] has been discharged from the program as requested. [name]'s husband said she may be interested in participating at a later date and time. Harrell Gave RN BSN

## 2022-09-21 ENCOUNTER — Ambulatory Visit (HOSPITAL_COMMUNITY): Payer: Medicare PPO

## 2022-09-23 ENCOUNTER — Ambulatory Visit (HOSPITAL_COMMUNITY): Payer: Medicare PPO

## 2022-09-26 ENCOUNTER — Ambulatory Visit (HOSPITAL_COMMUNITY): Payer: Medicare PPO

## 2022-09-28 ENCOUNTER — Ambulatory Visit (HOSPITAL_COMMUNITY): Payer: Medicare PPO

## 2022-09-30 ENCOUNTER — Ambulatory Visit (HOSPITAL_COMMUNITY): Payer: Medicare PPO

## 2022-10-03 ENCOUNTER — Ambulatory Visit (HOSPITAL_COMMUNITY): Payer: Medicare PPO

## 2022-10-04 DIAGNOSIS — M79631 Pain in right forearm: Secondary | ICD-10-CM | POA: Diagnosis not present

## 2022-10-05 ENCOUNTER — Ambulatory Visit (HOSPITAL_COMMUNITY): Payer: Medicare PPO

## 2022-10-07 ENCOUNTER — Ambulatory Visit (HOSPITAL_COMMUNITY): Payer: Medicare PPO

## 2022-10-07 NOTE — Progress Notes (Signed)
Remote pacemaker transmission.   

## 2022-10-10 ENCOUNTER — Ambulatory Visit (HOSPITAL_COMMUNITY): Payer: Medicare PPO

## 2022-10-10 ENCOUNTER — Ambulatory Visit: Payer: Medicare PPO | Attending: Student | Admitting: Nurse Practitioner

## 2022-10-10 VITALS — BP 122/68

## 2022-10-10 DIAGNOSIS — I5022 Chronic systolic (congestive) heart failure: Secondary | ICD-10-CM | POA: Diagnosis not present

## 2022-10-10 DIAGNOSIS — I495 Sick sinus syndrome: Secondary | ICD-10-CM

## 2022-10-10 DIAGNOSIS — T81718A Complication of other artery following a procedure, not elsewhere classified, initial encounter: Secondary | ICD-10-CM

## 2022-10-10 DIAGNOSIS — I255 Ischemic cardiomyopathy: Secondary | ICD-10-CM | POA: Diagnosis not present

## 2022-10-10 DIAGNOSIS — I480 Paroxysmal atrial fibrillation: Secondary | ICD-10-CM | POA: Diagnosis not present

## 2022-10-10 DIAGNOSIS — I2510 Atherosclerotic heart disease of native coronary artery without angina pectoris: Secondary | ICD-10-CM

## 2022-10-10 DIAGNOSIS — I729 Aneurysm of unspecified site: Secondary | ICD-10-CM

## 2022-10-10 DIAGNOSIS — Z01810 Encounter for preprocedural cardiovascular examination: Secondary | ICD-10-CM

## 2022-10-10 DIAGNOSIS — E785 Hyperlipidemia, unspecified: Secondary | ICD-10-CM

## 2022-10-10 DIAGNOSIS — I10 Essential (primary) hypertension: Secondary | ICD-10-CM

## 2022-10-10 NOTE — Progress Notes (Unsigned)
Office Visit    Patient Name: Connie [name] Date of Encounter: 10/10/2022  Primary Care Provider:  Glenis Smoker, MD Primary Cardiologist:  Donato Heinz, MD  Chief Complaint   82 year old female with a history of CAD, chronic systolic heart failure, ICM, paroxysmal atrial flutter,  tachycardia-bradycardia syndrome s/p PPM, right radial artery pseudoaneurysm, hypertension, hyperlipidemia, hypothyroidism, GERD, and psoriatic arthritis who presents for follow-up related to CAD, heart failure and atrial fibrillation and for preoperative cardiac evaluation.    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   Multiple, noted on [place]   History of hiatal hernia    Hypertension    Hypothyroidism    Macular degeneration    Psoriatic arthritis ([place])    Sinus drainage    UTI (urinary tract infection)    Past Surgical History:  Procedure Laterality Date   BACK SURGERY  2000   Dr Sherwood Gambler   bladder tack  2005   COLONOSCOPY     CORONARY ANGIOPLASTY     CORONARY STENT INTERVENTION N/A 04/11/2022   Procedure: CORONARY STENT INTERVENTION;  Surgeon: Sherren Mocha, MD;  Location: [facility];  Service: Cardiovascular;  Laterality: N/A;   DILATION AND CURETTAGE OF UTERUS     after miscarriage   INSERT / REPLACE / REMOVE PACEMAKER     LEFT HEART CATH AND CORONARY ANGIOGRAPHY N/A 04/07/2022   Procedure: LEFT HEART CATH AND CORONARY ANGIOGRAPHY;  Surgeon: Sherren Mocha, MD;  Location: [place];  Service: Cardiovascular;  Laterality: N/A;   PACEMAKER IMPLANT N/A 05/27/2022   Procedure: PACEMAKER IMPLANT;  Surgeon: Melida Quitter, MD;  Location: [facility];  Service: Cardiovascular;  Laterality: N/A;   RECTAL PROLAPSE REPAIR  2008   SCAR REVISION Right 2017   Knee, Dr. Berenice Primas    TONSILLECTOMY     as a child   TOTAL KNEE ARTHROPLASTY Right 09/05/2014   Procedure: TOTAL KNEE ARTHROPLASTY;  Surgeon: Alta Corning, MD;  Location: [facility];  Service: Orthopedics;  Laterality: Right;   TOTAL KNEE ARTHROPLASTY Left 01/10/2022   Procedure: TOTAL KNEE ARTHROPLASTY;  Surgeon: Dorna Leitz, MD;  Location: [facility];  Service: Orthopedics;  Laterality: Left;   TOTAL SHOULDER ARTHROPLASTY Left 11/17/2017   Procedure: LEFT TOTAL SHOULDER ARTHROPLASTY;  Surgeon: Dorna Leitz, MD;  Location: [facility];  Service: Orthopedics;  Laterality: Left;  with block   TUBAL LIGATION  1972    Allergies  Allergies  Allergen Reactions   Penicillins Rash and Other (See Comments)    Has patient had a PCN reaction causing immediate rash, facial/tongue/throat swelling, SOB or lightheadedness with hypotension: Yes Has patient had a PCN reaction causing severe rash involving mucus membranes or skin necrosis: No Has patient had a PCN reaction that required hospitalization: No Has patient had a PCN reaction occurring within the last 10 years: No If all of the above answers are "NO", then may proceed with Cephalosporin use.    Sulfa Antibiotics Rash     Labs/Other Studies Reviewed    The following studies were reviewed today: Echo 05/2022: IMPRESSIONS     1. Akinesis of the anteroseptal, apical and distal inferior walls with  overall moderate to severe LV dysfunction.   2. Left ventricular ejection fraction, by estimation, is 30 to  35%. The  left ventricle has moderate to severely decreased function. The left  ventricle demonstrates regional wall motion abnormalities (see scoring  diagram/findings for description). Left  ventricular diastolic parameters are consistent with Grade I diastolic  dysfunction (impaired relaxation).   3. Right ventricular systolic function is normal. The right ventricular  size is normal.   4. The mitral valve is normal in structure. Trivial mitral valve   regurgitation. No evidence of mitral stenosis.   5. The aortic valve is tricuspid. Aortic valve regurgitation is not  visualized. Aortic valve sclerosis is present, with no evidence of aortic  valve stenosis.   6. The inferior vena cava is normal in size with greater than 50%  respiratory variability, suggesting right atrial pressure of 3 mmHg.   Cath 03/2022 (staged PCI):    Mid RCA lesion is 90% stenosed.   Post intervention, there is a 0% residual stenosis.   1.  Successful PTCA and stenting of severe tandem lesions in the RCA using a 3.5 x 38 mm drug-eluting stent 2.  Continued patency of the LAD stent with TIMI-3 flow into the apical LAD   Recommendations: DAPT with clopidogrel x12 months.  Patient has been diagnosed with paroxysmal atrial fibrillation.  We will start apixaban at 2200 today.  Continue aspirin 81 mg x 30 days.   Cath 03/2022:    Prox LAD to Mid LAD lesion is 100% stenosed.   Prox RCA lesion is 60% stenosed.   Mid RCA lesion is 90% stenosed.   A drug-eluting stent was successfully placed using a SYNERGY XD 2.75X20.   Post intervention, there is a 0% residual stenosis.   LV end diastolic pressure is severely elevated.   1.  Acute anterior STEMI secondary to thrombotic occlusion of the mid LAD just after the first diagonal, treated with PCI using a 2.75 x 20 mm Synergy DES.  Procedure complicated by no reflow phenomenon likely due to late STEMI presentation with TIMI II flow at the apical portion of the LAD at the completion of the procedure 2.  Severe mid RCA stenosis, recommend staged PCI once she recovers from her anterior infarct 3.  Patent left main and left circumflex with mild nonobstructive plaquing 4.  Severely elevated LVEDP of 28 mmHg consistent with acute systolic heart failure secondary to acute myocardial infarction   Recommendations: ICU care, post MI medical therapy, patient loaded with ticagrelor 180 mg at the completion of the procedure, continue  cangrelor x2 hours, consider staged PCI of the RCA prior to discharge pending her clinical course.  Will check 2D echo to assess degree of LV dysfunction.    Recent Labs: 04/08/2022: TSH 0.773 04/20/2022: B Natriuretic Peptide 925.5 05/06/2022: ALT 17 05/26/2022: Magnesium 2.0 05/27/2022: BUN 14; Creatinine, Ser 1.17; Hemoglobin 11.3; Platelets 317; Potassium 4.1; Sodium 138  Recent Lipid Panel    Component Value Date/Time   CHOL 108 05/06/2022 0959   TRIG 91 05/06/2022 0959   HDL 44 05/06/2022 0959   CHOLHDL 2.5 05/06/2022 0959   CHOLHDL 2.5 04/07/2022 1044   VLDL 21 04/07/2022 1044   LDLCALC 46 05/06/2022 0959    History of Present Illness    82 year old female with the above past medical history including CAD, chronic systolic heart failure, ICM, paroxysmal atrial flutter,  tachycardia-bradycardia syndrome s/p PPM, right radial artery pseudoaneurysm, hypertension, hyperlipidemia, hypothyroidism, GERD, and psoriatic arthritis.   She was referred to Dr. Gardiner Rhyme in August 2022 in the setting of palpitations. 7-day Zio patch in 03/2021 revealed  atrial flutter 2% burden), echocardiogram in 03/2021 showed normal biventricular function, no significant valvular disease. Calcium score in 03/2021 was 604 (81st percentile). Lexiscan Myoview in 07/2021 showed no evidence of ischemia, EF 79%.  She was hospitalized in 03/2022 in the setting of anterior STEMI.  LHC showed thrombotic occlusion to mLAD s/p DES. She was noted to have severe mRCA disease and underwent staged PCI/DES-RCA on 04/11/2022.  Echocardiogram showed EF 35 to 40%, akinesis of LV, entire anterior wall, anteroseptal wall, apical segment, and a inferior apical segment, no LV apical thrombus, RV mildly reduced.  She was diuresed with IV Lasix and started on GDMT.  Her hospital course was complicated by atrial fibrillation with RVR with postconversion pauses up to 6 to 7 seconds in duration.  She was started on amiodarone and Eliquis.  EP was  consulted. PPM was deferred.  Outpatient cardiac monitor revealed significant pauses, atrial fibrillation/atrial flutter, tachycardia bradycardia syndrome.  She was again referred to EP.  She was ultimately hospitalized in November 2023 in the setting of tachybradycardia syndrome.  She underwent PPP implantation on 05/27/2022 with Medtronic dual-chamber pacemaker.  Beta-blocker therapy was reinitiated.  Repeat echocardiogram showed EF 30 to 35%, akinesis of the anteroseptal, apical, distal inferior wall with overall moderate to severe LV dysfunction, G1 DD, trivial MR.  She was last seen in the office by Dr. Myles Gip on 08/29/2022 and was stable from a cardiac standpoint.  She was referred to vascular surgery in the setting of postprocedure right radial artery pseudoaneurysm.   She presents today for follow-accompanied by her husband.  Since her last visit she has done well from a cardiac standpoint.  She has had 2 episodes of fleeting chest discomfort. She denies any other chest pain; denies dyspnea, edema, PND, orthopnea, or weight gain.  She is following with vascular surgery and is pending possible surgery for right radial artery pseudoaneurysm.  She is also due for routine dental cleaning.  She and her husband have been under a significant amount of stress in the setting of having been without plumbing or running water for 2 months.  This was restored just last week. Otherwise, she reports feeling well.   Home Medications    Current Outpatient Medications  Medication Sig Dispense Refill   amiodarone (PACERONE) 200 MG tablet TAKE 1 TABLET(200 MG) BY MOUTH DAILY 90 tablet 3   atorvastatin (LIPITOR) 80 MG tablet Take 1 tablet (80 mg total) by mouth daily. 90 tablet 3   carvedilol (COREG) 6.25 MG tablet Take 1 tablet (6.25 mg total) by mouth 2 (two) times daily. 60 tablet 5   clopidogrel (PLAVIX) 75 MG tablet Take 1 tablet (75 mg total) by mouth daily. 90 tablet 3   ELIQUIS 5 MG TABS tablet TAKE 1 TABLET(5  MG) BY MOUTH TWICE DAILY 60 tablet 3   empagliflozin (JARDIANCE) 10 MG TABS tablet TAKE 1 TABLET(10 MG) BY MOUTH DAILY 30 tablet 10   furosemide (LASIX) 20 MG tablet TAKE 1 TABLET BY MOUTH EVERY MONDAY, WEDNESDAY, AND FRIDAY. 30 tablet 0   levothyroxine (SYNTHROID) 88 MCG tablet Take 88 mcg by mouth daily before breakfast.     losartan (COZAAR) 25 MG tablet Take 1 tablet (25 mg total) by mouth daily. 90 tablet 3   nitroGLYCERIN (NITROSTAT) 0.4 MG SL tablet Place 1 tablet (0.4 mg total) under the tongue every 5 (five) minutes x 3 doses as needed for chest pain. 25 tablet 3   sertraline (ZOLOFT) 100 MG tablet Take 100 mg by  mouth daily.     No current facility-administered medications for this visit.     Review of Systems    She denies chest pain, palpitations, dyspnea, pnd, orthopnea, n, v, dizziness, syncope, edema, weight gain, or early satiety. All other systems reviewed and are otherwise negative except as noted above.   Physical Exam    VS:  BP 122/68  , BMI There is no height or weight on file to calculate BMI. STOP-Bang Score:  4      GEN: Well nourished, well developed, in no acute distress. HEENT: normal. Neck: Supple, no JVD, carotid bruits, or masses. Cardiac: RRR, no murmurs, rubs, or gallops. No clubbing, cyanosis, edema.  Radials/DP/PT 2+ and equal bilaterally. R radial mass.  Respiratory:  Respirations regular and unlabored, clear to auscultation bilaterally. GI: Soft, nontender, nondistended, BS + x 4. MS: no deformity or atrophy. Skin: warm and dry, no rash. Neuro:  Strength and sensation are intact. Psych: Normal affect.  Accessory Clinical Findings    ECG personally reviewed by me today -atrial paced, 82 bpm, LAD, RBBB- no acute changes.   Lab Results  Component Value Date   WBC 10.4 05/27/2022   HGB 11.3 (L) 05/27/2022   HCT 36.0 05/27/2022   MCV 86.3 05/27/2022   PLT 317 05/27/2022   Lab Results  Component Value Date   CREATININE 1.17 (H) 05/27/2022    BUN 14 05/27/2022   NA 138 05/27/2022   K 4.1 05/27/2022   CL 105 05/27/2022   CO2 23 05/27/2022   Lab Results  Component Value Date   ALT 17 05/06/2022   AST 23 05/06/2022   ALKPHOS 114 05/06/2022   BILITOT 0.6 05/06/2022   Lab Results  Component Value Date   CHOL 108 05/06/2022   HDL 44 05/06/2022   LDLCALC 46 05/06/2022   TRIG 91 05/06/2022   CHOLHDL 2.5 05/06/2022    Lab Results  Component Value Date   HGBA1C 5.6 04/07/2022    Assessment & Plan    1. CAD: S/p DES-mLAD, DES-RCA on 03/2022.  She has had two episodes of fleeting chest discomfort, otherwise stable with no anginal symptoms. Encouraged increased activity as tolerated. No ASA in the setting of Eliquis. Continue Plavix, carvedilol, losartan, Jardiance, Lasix, and Lipitor.    2. Chronic systolic heart failure/ICM: Most recent echo showed EF 30 to 35%, akinesis of the anteroseptal, apical, distal inferior wall with overall moderate to severe LV dysfunction, G1 DD, trivial MR. Euvolemic and well compensated on exam.  Continue current medications as above.  Consider repeat echo at next follow-up visit.  For now, continue current medications as above.    3. 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  EKG today shows atrial paced rhythm.  Denies any palpitations, dizziness.  Labs within the past 6 months including CBC, CMET, TSH were stable.  Continue Eliquis, amiodarone, carvedilol.   4. Hypertension: BP well controlled, borderline soft at times. Continue current antihypertensive regimen.    5. Hyperlipidemia: LDL was 46 in 04/2022.  Continue Lipitor.  6. R radial artery pseudoaneurysm: Palpable pulse distal to pseudoaneurysm site. Pending possible repair per vascular surgery.   7. Preoperative cardiac exam: According to the Revised Cardiac Risk Index (RCRI), her Perioperative Risk of Major Cardiac Event is (%): 6.6. Her Functional Capacity in METs is: 5.07 according to the [employer] Activity  Status Index (DASI). Therefore, based on ACC/AHA guidelines, patient would be at acceptable risk for the planned procedure without further cardiovascular testing. I will route  this recommendation to Dr. Stanford Breed with vascular surgery. She should continue Plavix throughout the perioperative period given recent PCI. Additionally, she is ok to proceed with routine dental cleaning. She does not require SBE prophylaxis.    7. Disposition: She has an appointment in April with Dr. Gardiner Rhyme that she would like to cancel as she is doing well at this time.  Follow-up in 4-6 months.        Lenna Sciara, NP 10/10/2022, 9:27 AM

## 2022-10-10 NOTE — Patient Instructions (Signed)
Medication Instructions:  Your physician recommends that you continue on your current medications as directed. Please refer to the Current Medication list given to you today.  *If you need a refill on your cardiac medications before your next appointment, please call your pharmacy*   Lab Work: NONE ordered at this time of appointment   If you have labs (blood work) drawn today and your tests are completely normal, you will receive your results only by: [place] (if you have MyChart) OR A paper copy in the mail If you have any lab test that is abnormal or we need to change your treatment, we will call you to review the results.   Testing/Procedures: NONE ordered at this time of appointment    Follow-Up: At [facility], you and your health needs are our priority.  As part of our continuing mission to provide you with exceptional heart care, we have created designated Provider Care Teams.  These Care Teams include your primary Cardiologist (physician) and Advanced Practice Providers (APPs -  Physician Assistants and Nurse Practitioners) who all work together to provide you with the care you need, when you need it.  We recommend signing up for the patient portal called "MyChart".  Sign up information is provided on this After Visit Summary.  MyChart is used to connect with patients for Virtual Visits (Telemedicine).  Patients are able to view lab/test results, encounter notes, upcoming appointments, etc.  Non-urgent messages can be sent to your provider as well.   To learn more about what you can do with MyChart, go to [email or web address].    Your next appointment:   4-6 month(s)  Provider:   Donato Heinz, MD  or Diona Browner, NP        Other Instructions

## 2022-10-11 ENCOUNTER — Encounter: Payer: Self-pay | Admitting: Nurse Practitioner

## 2022-10-12 ENCOUNTER — Other Ambulatory Visit (HOSPITAL_COMMUNITY): Payer: Medicare PPO

## 2022-10-12 ENCOUNTER — Ambulatory Visit: Payer: Medicare PPO | Admitting: Vascular Surgery

## 2022-10-12 ENCOUNTER — Ambulatory Visit (HOSPITAL_COMMUNITY): Payer: Medicare PPO

## 2022-10-13 ENCOUNTER — Ambulatory Visit: Payer: Medicare PPO | Admitting: Vascular Surgery

## 2022-10-13 DIAGNOSIS — H35033 Hypertensive retinopathy, bilateral: Secondary | ICD-10-CM | POA: Diagnosis not present

## 2022-10-13 DIAGNOSIS — H353124 Nonexudative age-related macular degeneration, left eye, advanced atrophic with subfoveal involvement: Secondary | ICD-10-CM | POA: Diagnosis not present

## 2022-10-13 DIAGNOSIS — H35423 Microcystoid degeneration of retina, bilateral: Secondary | ICD-10-CM | POA: Diagnosis not present

## 2022-10-13 DIAGNOSIS — H353114 Nonexudative age-related macular degeneration, right eye, advanced atrophic with subfoveal involvement: Secondary | ICD-10-CM | POA: Diagnosis not present

## 2022-10-13 DIAGNOSIS — H43813 Vitreous degeneration, bilateral: Secondary | ICD-10-CM | POA: Diagnosis not present

## 2022-10-13 DIAGNOSIS — H35433 Paving stone degeneration of retina, bilateral: Secondary | ICD-10-CM | POA: Diagnosis not present

## 2022-10-14 ENCOUNTER — Other Ambulatory Visit (HOSPITAL_COMMUNITY): Payer: Self-pay | Admitting: Cardiology

## 2022-10-14 ENCOUNTER — Ambulatory Visit (HOSPITAL_COMMUNITY): Payer: Medicare PPO

## 2022-10-17 ENCOUNTER — Ambulatory Visit (HOSPITAL_COMMUNITY): Payer: Medicare PPO

## 2022-10-19 ENCOUNTER — Ambulatory Visit (HOSPITAL_COMMUNITY): Payer: Medicare PPO

## 2022-10-21 ENCOUNTER — Ambulatory Visit (HOSPITAL_COMMUNITY): Payer: Medicare PPO

## 2022-10-21 ENCOUNTER — Ambulatory Visit: Payer: Medicare PPO | Admitting: Student

## 2022-10-27 ENCOUNTER — Ambulatory Visit (INDEPENDENT_AMBULATORY_CARE_PROVIDER_SITE_OTHER): Payer: Self-pay | Admitting: Vascular Surgery

## 2022-10-27 DIAGNOSIS — I729 Aneurysm of unspecified site: Secondary | ICD-10-CM

## 2022-10-27 DIAGNOSIS — T81718A Complication of other artery following a procedure, not elsewhere classified, initial encounter: Secondary | ICD-10-CM

## 2022-10-27 NOTE — Progress Notes (Signed)
Discussed consultant reports with patient in detail. We reviewed her non-invasive testing as well. Will plan for direct repair of right radial artery pseudoaneurysm. She prefers early May 2024 which would be safe. Will continue Plavix through the surgery. Will hold Eliquis 48h prior and resume postop.  Connie [name]. Connie Breed, MD [facility] Phone Number: [phone] 10/27/2022 4:17 PM

## 2022-11-01 ENCOUNTER — Other Ambulatory Visit: Payer: Self-pay

## 2022-11-01 DIAGNOSIS — T81718A Complication of other artery following a procedure, not elsewhere classified, initial encounter: Secondary | ICD-10-CM

## 2022-11-03 NOTE — Pre-Procedure Instructions (Signed)
Surgical Instructions    Your procedure is scheduled on Wednesday, November 09, 2022 at 11:31 AM.  Report to [facility] Main Entrance "A" at 9:30 A.M., then check in with the Admitting office.  Call this number if you have problems the morning of surgery:  [phone]   If you have any questions prior to your surgery date call [phone]: Open Monday-Friday 8am-4pm  *If you experience any cold or flu symptoms such as cough, fever, chills, shortness of breath, etc. between now and your scheduled surgery, please notify us.*    Remember:  Do not eat or drink after midnight the night before your surgery    Take these medicines the morning of surgery with A SIP OF WATER:  amiodarone (PACERONE)  carvedilol (COREG)  clopidogrel (PLAVIX)  levofloxacin (LEVAQUIN)  levothyroxine (SYNTHROID)  sertraline (ZOLOFT)  nitroGLYCERIN (NITROSTAT) - if needed   Per your surgeon, continue clopidogrel (PLAVIX). Hold Eliquis 48 hours prior to surgery. Your last dose should be on 11/06/22.   As of today, STOP taking any Aspirin (unless otherwise instructed by your surgeon) Aleve, Naproxen, Ibuprofen, Motrin, Advil, Goody's, BC's, all herbal medications, fish oil, and all vitamins.                     Do NOT Smoke (Tobacco/Vaping) for 24 hours prior to your procedure.  If you use a CPAP at night, you may bring your mask/headgear for your overnight stay.   Contacts, glasses, piercing's, hearing aid's, dentures or partials may not be worn into surgery, please bring cases for these belongings.    For patients admitted to the hospital, discharge time will be determined by your treatment team.   Patients discharged the day of surgery will not be allowed to drive home, and someone needs to stay with them for 24 hours.  SURGICAL WAITING ROOM VISITATION Patients having surgery or a procedure may have 2 support people in the waiting area. Visitors may stay in the waiting area during the procedure and  switch out with other visitors if needed. Only 1 support person is allowed in the pre-op area with the patient AFTER the patient is prepped. This person cannot be switched out.  Children under the age of 94 must have an adult accompany them who is not the patient. If the patient needs to stay at the hospital during part of their recovery, the visitor guidelines for inpatient rooms apply.  Please refer to the [facility] website for the visitor guidelines for Inpatients (after your surgery is over and you are in a regular room).    Special instructions:   [place]- Preparing For Surgery  Before surgery, you can play an important role. Because skin is not sterile, your skin needs to be as free of germs as possible. You can reduce the number of germs on your skin by washing with CHG (chlorahexidine gluconate) Soap before surgery.  CHG is an antiseptic cleaner which kills germs and bonds with the skin to continue killing germs even after washing.    Oral Hygiene is also important to reduce your risk of infection.  Remember - BRUSH YOUR TEETH THE MORNING OF SURGERY WITH YOUR REGULAR TOOTHPASTE  Please do not use if you have an allergy to CHG or antibacterial soaps. If your skin becomes reddened/irritated stop using the CHG.  Do not shave (including legs and underarms) for at least 48 hours prior to first CHG shower. It is OK to shave your face.  Please follow these  instructions carefully.   Shower the NIGHT BEFORE SURGERY and the MORNING OF SURGERY  If you chose to wash your hair, wash your hair first as usual with your normal shampoo.  After you shampoo, rinse your hair and body thoroughly to remove the shampoo.  Use CHG Soap as you would any other liquid soap. You can apply CHG directly to the skin and wash gently with a scrungie or a clean washcloth.   Apply the CHG Soap to your body ONLY FROM THE NECK DOWN (neck, arms, chest, abdomen, legs, and back).  Do not use on open wounds or open  sores. Avoid contact with your eyes, ears, mouth and genitals (private parts). Wash Face and genitals (private parts)  with your normal soap.   Wash thoroughly, paying special attention to the area where your surgery will be performed.  Thoroughly rinse your body with warm water from the neck down.  DO NOT shower/wash with your normal soap after using and rinsing off the CHG Soap.  Pat yourself dry with a CLEAN TOWEL.  Wear CLEAN PAJAMAS to bed the night before surgery  Place CLEAN SHEETS on your bed the night before your surgery  DO NOT SLEEP WITH PETS.   Day of Surgery: Take a shower with CHG soap. Do not wear lotions, powders, perfumes/colognes, or deodorant. Do not wear jewelry or makeup Do not shave 48 hours prior to surgery. Do not wear nail polish, gel polish, artificial nails, or any other type of covering on natural nails (fingers and toes) If you have artificial nails or gel coating that need to be removed by a nail salon, please have this removed prior to surgery. Artificial nails or gel coating may interfere with anesthesia's ability to adequately monitor your vital signs. Wear Clean/Comfortable clothing the morning of surgery Do not bring valuables to the hospital.  [facility] is not responsible for any belongings or valuables. Remember to brush your teeth WITH YOUR REGULAR TOOTHPASTE.   Please read over the following fact sheets that you were given.  If you received a COVID test during your pre-op visit  it is requested that you wear a mask when out in public, stay away from anyone that may not be feeling well and notify your surgeon if you develop symptoms. If you have been in contact with anyone that has tested positive in the last 10 days please notify you surgeon.

## 2022-11-04 ENCOUNTER — Encounter (HOSPITAL_COMMUNITY)
Admission: RE | Admit: 2022-11-04 | Discharge: 2022-11-04 | Disposition: A | Discharge status: Home / Self Care | Payer: Medicare PPO | Source: Ambulatory Visit | Attending: Vascular Surgery | Admitting: Vascular Surgery

## 2022-11-04 ENCOUNTER — Encounter (HOSPITAL_COMMUNITY): Payer: Self-pay

## 2022-11-04 ENCOUNTER — Encounter: Payer: Self-pay | Admitting: Cardiovascular Disease

## 2022-11-04 ENCOUNTER — Other Ambulatory Visit: Payer: Self-pay

## 2022-11-04 VITALS — BP 153/78 | HR 84 | Temp 98.4°F | Resp 18 | Ht 61.0 in | Wt 155.2 lb

## 2022-11-04 DIAGNOSIS — I5022 Chronic systolic (congestive) heart failure: Secondary | ICD-10-CM | POA: Diagnosis not present

## 2022-11-04 DIAGNOSIS — K219 Gastro-esophageal reflux disease without esophagitis: Secondary | ICD-10-CM | POA: Diagnosis not present

## 2022-11-04 DIAGNOSIS — I729 Aneurysm of unspecified site: Secondary | ICD-10-CM | POA: Diagnosis not present

## 2022-11-04 DIAGNOSIS — I110 Hypertensive heart disease with heart failure: Secondary | ICD-10-CM | POA: Diagnosis not present

## 2022-11-04 DIAGNOSIS — T81718A Complication of other artery following a procedure, not elsewhere classified, initial encounter: Secondary | ICD-10-CM | POA: Diagnosis not present

## 2022-11-04 DIAGNOSIS — Z955 Presence of coronary angioplasty implant and graft: Secondary | ICD-10-CM | POA: Diagnosis not present

## 2022-11-04 DIAGNOSIS — I252 Old myocardial infarction: Secondary | ICD-10-CM | POA: Diagnosis not present

## 2022-11-04 DIAGNOSIS — Z950 Presence of cardiac pacemaker: Secondary | ICD-10-CM | POA: Insufficient documentation

## 2022-11-04 DIAGNOSIS — I4892 Unspecified atrial flutter: Secondary | ICD-10-CM | POA: Insufficient documentation

## 2022-11-04 DIAGNOSIS — Z01818 Encounter for other preprocedural examination: Secondary | ICD-10-CM | POA: Insufficient documentation

## 2022-11-04 DIAGNOSIS — I495 Sick sinus syndrome: Secondary | ICD-10-CM | POA: Insufficient documentation

## 2022-11-04 DIAGNOSIS — I2510 Atherosclerotic heart disease of native coronary artery without angina pectoris: Secondary | ICD-10-CM | POA: Insufficient documentation

## 2022-11-04 DIAGNOSIS — Z7902 Long term (current) use of antithrombotics/antiplatelets: Secondary | ICD-10-CM | POA: Insufficient documentation

## 2022-11-04 LAB — URINALYSIS, ROUTINE W REFLEX MICROSCOPIC
Bilirubin Urine: NEGATIVE
Glucose, UA: 150 mg/dL — AB
Hgb urine dipstick: NEGATIVE
Ketones, ur: NEGATIVE mg/dL
Nitrite: NEGATIVE
Protein, ur: NEGATIVE mg/dL
Specific Gravity, Urine: 1.018 (ref 1.005–1.030)
pH: 5 (ref 5.0–8.0)

## 2022-11-04 LAB — PROTIME-INR
INR: 1.7 — ABNORMAL HIGH (ref 0.8–1.2)
Prothrombin Time: 19.5 seconds — ABNORMAL HIGH (ref 11.4–15.2)

## 2022-11-04 LAB — TYPE AND SCREEN
ABO/RH(D): O POS
Antibody Screen: NEGATIVE

## 2022-11-04 LAB — CBC
HCT: 41.4 % (ref 36.0–46.0)
Hemoglobin: 13.3 g/dL (ref 12.0–15.0)
MCH: 27.1 pg (ref 26.0–34.0)
MCHC: 32.1 g/dL (ref 30.0–36.0)
MCV: 84.3 fL (ref 80.0–100.0)
Platelets: 296 10*3/uL (ref 150–400)
RBC: 4.91 MIL/uL (ref 3.87–5.11)
RDW: 16.5 % — ABNORMAL HIGH (ref 11.5–15.5)
WBC: 9.2 10*3/uL (ref 4.0–10.5)
nRBC: 0 % (ref 0.0–0.2)

## 2022-11-04 LAB — COMPREHENSIVE METABOLIC PANEL
ALT: 47 U/L — ABNORMAL HIGH (ref 0–44)
AST: 49 U/L — ABNORMAL HIGH (ref 15–41)
Albumin: 4 g/dL (ref 3.5–5.0)
Alkaline Phosphatase: 127 U/L — ABNORMAL HIGH (ref 38–126)
Anion gap: 7 (ref 5–15)
BUN: 23 mg/dL (ref 8–23)
CO2: 25 mmol/L (ref 22–32)
Calcium: 9 mg/dL (ref 8.9–10.3)
Chloride: 103 mmol/L (ref 98–111)
Creatinine, Ser: 1.31 mg/dL — ABNORMAL HIGH (ref 0.44–1.00)
GFR, Estimated: 40 mL/min — ABNORMAL LOW (ref >60)
Glucose, Bld: 92 mg/dL (ref 70–99)
Potassium: 4.9 mmol/L (ref 3.5–5.1)
Sodium: 135 mmol/L (ref 135–145)
Total Bilirubin: 0.9 mg/dL (ref 0.3–1.2)
Total Protein: 6.6 g/dL (ref 6.5–8.1)

## 2022-11-04 LAB — APTT: aPTT: 46 seconds — ABNORMAL HIGH (ref 24–36)

## 2022-11-04 LAB — SURGICAL PCR SCREEN
MRSA, PCR: NEGATIVE
Staphylococcus aureus: NEGATIVE

## 2022-11-04 NOTE — Progress Notes (Signed)
PERIOPERATIVE PRESCRIPTION FOR IMPLANTED CARDIAC DEVICE PROGRAMMING  Patient Information: Name:  Connie [name]  DOB:  05-31-1941  MRN:  [id number]    Planned Procedure:  Right Radial Artery Psuedoaneurysm repair  Surgeon:  Dr. Hoffmeier Lark  Date of Procedure:  11/09/22 @ 1131  Position during surgery:  Supine   Please send documentation back to:  [facility] (Fax # [phone])  Device Information:  Clinic EP Physician:  Dr. Nelly Laurence   Device Type:  Pacemaker Manufacturer and Phone #:  Medtronic: [id number] Pacemaker Dependent?:  No. Date of Last Device Check:  10/01/22 Remote  Normal Device Function?:  Yes.    Electrophysiologist's Recommendations:  Have magnet available. Provide continuous ECG monitoring when magnet is used or reprogramming is to be performed.  Procedure should not interfere with device function.  No device programming or magnet placement needed.  Per Device Clinic Standing Orders, Dorathy Daft, RN  12:17 PM 11/04/2022

## 2022-11-04 NOTE — Progress Notes (Signed)
PCP - Dr. Garth Bigness Cardiologist -  Dr. Bjorn Pippin  PPM/ICD - Yes. [employer] Device Orders - Received and on the chart Rep Notified - 11/03/2022  Chest x-ray - n/a EKG - 10/10/2022 Stress Test - 08/10/2022 ECHO - 05/27/2022 Cardiac Cath - 04/11/2022  Sleep Study - Yes, not diagnosed with sleep apnea CPAP - Denies  Non-Diabetic Last dose of GLP1 agonist-  n/a GLP1 instructions: n/a  Blood Thinner Instructions: Continue Plavix. Hold Eliquis 48 hours prior to surgery with last dose 11/06/2022 Aspirin Instructions: Denies  ERAS Protcol - No, NPO PRE-SURGERY Ensure or G2- no   COVID TEST- n/a   Anesthesia review: Yes.  HTN, CAD, Dysthrythmia, Pacemaker.   Patient denies shortness of breath, fever, cough and chest pain at PAT appointment   All instructions explained to the patient, with a verbal understanding of the material. Patient agrees to go over the instructions while at home for a better understanding. Patient also instructed to self quarantine after being tested for COVID-19. The opportunity to ask questions was provided.

## 2022-11-07 NOTE — Anesthesia Preprocedure Evaluation (Signed)
Anesthesia Evaluation  Patient identified by MRN, date of birth, ID band Patient awake    Reviewed: Allergy & Precautions, NPO status , Patient's Chart, lab work & pertinent test results, reviewed documented beta blocker date and time   History of Anesthesia Complications Negative for: history of anesthetic complications  Airway Mallampati: II  TM Distance: >3 FB Neck ROM: Full    Dental  (+) Teeth Intact, Dental Advisory Given   Pulmonary neg pulmonary ROS   breath sounds clear to auscultation       Cardiovascular hypertension, Pt. on medications and Pt. on home beta blockers + CAD, + Past MI and + Cardiac Stents  + dysrhythmias Atrial Fibrillation + pacemaker  Rhythm:Regular   1. Akinesis of the anteroseptal, apical and distal inferior walls with  overall moderate to severe LV dysfunction.   2. Left ventricular ejection fraction, by estimation, is 30 to 35%. The  left ventricle has moderate to severely decreased function. The left  ventricle demonstrates regional wall motion abnormalities (see scoring  diagram/findings for description). Left  ventricular diastolic parameters are consistent with Grade I diastolic  dysfunction (impaired relaxation).   3. Right ventricular systolic function is normal. The right ventricular  size is normal.   4. The mitral valve is normal in structure. Trivial mitral valve  regurgitation. No evidence of mitral stenosis.   5. The aortic valve is tricuspid. Aortic valve regurgitation is not  visualized. Aortic valve sclerosis is present, with no evidence of aortic  valve stenosis.   6. The inferior vena cava is normal in size with greater than 50%  respiratory variability, suggesting right atrial pressure of 3 mmHg.     Mid RCA lesion is 90% stenosed.   Post intervention, there is a 0% residual stenosis.   1.  Successful PTCA and stenting of severe tandem lesions in the RCA using a 3.5 x 38 mm  drug-eluting stent 2.  Continued patency of the LAD stent with TIMI-3 flow into the apical LAD   Recommendations: DAPT with clopidogrel x12 months.  Patient has been diagnosed with paroxysmal atrial fibrillation.  We will start apixaban at 2200 today.  Continue aspirin 81 mg x 30 days.     Neuro/Psych  PSYCHIATRIC DISORDERS  Depression    negative neurological ROS     GI/Hepatic hiatal hernia,GERD  ,,  Endo/Other  Hypothyroidism    Renal/GU Renal InsufficiencyRenal diseaseLab Results      Component                Value               Date                      CREATININE               1.31 (H)            11/04/2022                Musculoskeletal  (+) Arthritis ,    Abdominal   Peds  Hematology Lab Results      Component                Value               Date                      WBC  9.2                 11/04/2022                HGB                      13.3                11/04/2022                HCT                      41.4                11/04/2022                MCV                      84.3                11/04/2022                PLT                      296                 11/04/2022                Anesthesia Other Findings   Reproductive/Obstetrics                             Anesthesia Physical Anesthesia Plan  ASA: 3  Anesthesia Plan: General   Post-op Pain Management: Ofirmev IV (intra-op)*   Induction: Intravenous  PONV Risk Score and Plan: 3 and Ondansetron and Dexamethasone  Airway Management Planned: Oral ETT  Additional Equipment: None  Intra-op Plan:   Post-operative Plan: Extubation in OR  Informed Consent: I have reviewed the patients History and Physical, chart, labs and discussed the procedure including the risks, benefits and alternatives for the proposed anesthesia with the patient or authorized representative who has indicated his/her understanding and acceptance.     Dental  advisory given  Plan Discussed with: CRNA  Anesthesia Plan Comments: (See PAT note from 4/12)        Anesthesia Quick Evaluation

## 2022-11-07 NOTE — Progress Notes (Signed)
Case: [id number] Date/Time: 11/09/22 1116   Procedures:      RIGHT RADIAL ARTERY PSEUDOANEURYSM REPAIR (Right)     POSSIBLE RADIAL-RADIAL BYPASS WITH GSV (Bilateral)   Anesthesia type: Choice   Pre-op diagnosis: Pseudoaneurysm following procedure   [facility] / [facility]   Surgeons: Leonie Douglas, MD       DISCUSSION: Connie [name] is an 82 yo who presents to PAT clinic for above surgery on 11/09/22. 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 Flutter, tachy-brady syndrome s/p PPM, HTN, GERD, post procedure right radial artery pseudoaneurysm. No prior anesthesia complications. ASA 3  CAD s/p PCI (03/2022) Hx of STEMI (03/2022) -Followed by Cardiology. Last seen in clinic on 10/10/22. Was stable at that time. Denies CP/SOB at PAT visit -Continue Plavix  #systolic CHF EF 30-35% -Noted to be euvolemic at Cardiology visit.  -Last echo on 05/27/22  #Preoperative cardiac exam -Received 10/10/22:  "According to the Revised Cardiac Risk Index (RCRI), her Perioperative Risk of Major Cardiac Event is (%): 6.6. Her Functional Capacity in METs is: 5.07 according to the Duke Activity Status Index (DASI). Therefore, based on ACC/AHA guidelines, patient would be at acceptable risk for the planned procedure without further cardiovascular testing. I will route this recommendation to Dr. Lenell Antu with vascular surgery. She should continue Plavix throughout the perioperative period given recent PCI. Additionally, she is ok to proceed with routine dental cleaning. She does not require SBE prophylaxis."  #HTN -Controlled on current regimen  #p A.flutter -s/p PPM -On Eliquis. Advised to hold 48 hours prior to proceudure    VS: BP (!) 153/78   Pulse 84   Temp 36.9 C (Oral)   Resp 18   Ht  (1.549 m)   Wt 70.4 kg   SpO2 100%   BMI 29.32 kg/m   PROVIDERS: PCP - Dr. Garth Bigness Cardiologist -  Dr. Bjorn Pippin   LABS: Labs reviewed: Acceptable for  surgery. (all labs ordered are listed, but only abnormal results are displayed)  Labs Reviewed  CBC - Abnormal; Notable for the following components:      Result Value   RDW 16.5 (*)    All other components within normal limits  COMPREHENSIVE METABOLIC PANEL - Abnormal; Notable for the following components:   Creatinine, Ser 1.31 (*)    AST 49 (*)    ALT 47 (*)    Alkaline Phosphatase 127 (*)    GFR, Estimated 40 (*)    All other components within normal limits  PROTIME-INR - Abnormal; Notable for the following components:   Prothrombin Time 19.5 (*)    INR 1.7 (*)    All other components within normal limits  APTT - Abnormal; Notable for the following components:   aPTT 46 (*)    All other components within normal limits  URINALYSIS, ROUTINE W REFLEX MICROSCOPIC - Abnormal; Notable for the following components:   Color, Urine AMBER (*)    APPearance HAZY (*)    Glucose, UA 150 (*)    Leukocytes,Ua MODERATE (*)    Bacteria, UA MANY (*)    All other components within normal limits  SURGICAL PCR SCREEN  TYPE AND SCREEN     IMAGES: 05/28/22:  IMPRESSION: 1. New demonstration of a left chest dual lead pacing system. 2. Small layering pleural effusions and overlying atelectasis, unchanged. 3. No pneumothorax or other acute changes.   EKG: 10/10/22: Atrial paced rhythm Left axis deviation RBBB Appears similar to  prior    CV:  Echo 05/2022: IMPRESSIONS     1. Akinesis of the anteroseptal, apical and distal inferior walls with  overall moderate to severe LV dysfunction.   2. Left ventricular ejection fraction, by estimation, is 30 to 35%. The  left ventricle has moderate to severely decreased function. The left  ventricle demonstrates regional wall motion abnormalities (see scoring  diagram/findings for description). Left  ventricular diastolic parameters are consistent with Grade I diastolic  dysfunction (impaired relaxation).   3. Right ventricular systolic  function is normal. The right ventricular  size is normal.   4. The mitral valve is normal in structure. Trivial mitral valve  regurgitation. No evidence of mitral stenosis.   5. The aortic valve is tricuspid. Aortic valve regurgitation is not  visualized. Aortic valve sclerosis is present, with no evidence of aortic  valve stenosis.   6. The inferior vena cava is normal in size with greater than 50%  respiratory variability, suggesting right atrial pressure of 3 mmHg.   Cath 04/11/2022 (staged PCI):    Mid RCA lesion is 90% stenosed.   Post intervention, there is a 0% residual stenosis.   1.  Successful PTCA and stenting of severe tandem lesions in the RCA using a 3.5 x 38 mm drug-eluting stent 2.  Continued patency of the LAD stent with TIMI-3 flow into the apical LAD   Recommendations: DAPT with clopidogrel x12 months.  Patient has been diagnosed with paroxysmal atrial fibrillation.  We will start apixaban at 2200 today.  Continue aspirin 81 mg x 30 days.   Cath 04/07/2022:    Prox LAD to Mid LAD lesion is 100% stenosed.   Prox RCA lesion is 60% stenosed.   Mid RCA lesion is 90% stenosed.   A drug-eluting stent was successfully placed using a SYNERGY XD 2.75X20.   Post intervention, there is a 0% residual stenosis.   LV end diastolic pressure is severely elevated.   1.  Acute anterior STEMI secondary to thrombotic occlusion of the mid LAD just after the first diagonal, treated with PCI using a 2.75 x 20 mm Synergy DES.  Procedure complicated by no reflow phenomenon likely due to late STEMI presentation with TIMI II flow at the apical portion of the LAD at the completion of the procedure 2.  Severe mid RCA stenosis, recommend staged PCI once she recovers from her anterior infarct 3.  Patent left main and left circumflex with mild nonobstructive plaquing 4.  Severely elevated LVEDP of 28 mmHg consistent with acute systolic heart failure secondary to acute myocardial infarction    Recommendations: ICU care, post MI medical therapy, patient loaded with ticagrelor 180 mg at the completion of the procedure, continue cangrelor x2 hours, consider staged PCI of the RCA prior to discharge pending her clinical course.  Will check 2D echo to assess degree of LV dysfunction.   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   Multiple, noted on [place]   History of hiatal hernia    Hypertension    Hypothyroidism    Macular degeneration    Psoriatic arthritis    Sinus drainage    UTI (urinary tract infection)     Past Surgical History:  Procedure Laterality Date   BACK SURGERY  2000   Dr Newell Coral   bladder tack  2005  COLONOSCOPY     CORONARY ANGIOPLASTY     CORONARY STENT INTERVENTION N/A 04/11/2022   Procedure: CORONARY STENT INTERVENTION;  Surgeon: Tonny Bollman, MD;  Location[facility] INVASIVE CV LAB;  Service: Cardiovascular;  Laterality: N/A;   DILATION AND CURETTAGE OF UTERUS     after miscarriage   INSERT / REPLACE / REMOVE PACEMAKER     LEFT HEART CATH AND CORONARY ANGIOGRAPHY N/A 04/07/2022   Procedure: LEFT HEART CATH AND CORONARY ANGIOGRAPHY;  Surgeon: Tonny Bollman, MD;  Location: [facility] INVASIVE CV LAB;  Service: Cardiovascular;  Laterality: N/A;   PACEMAKER IMPLANT N/A 05/27/2022   Procedure: PACEMAKER IMPLANT;  Surgeon: Maurice Small, MD;  Location: [facility] INVASIVE CV LAB;  Service: Cardiovascular;  Laterality: N/A;   RECTAL PROLAPSE REPAIR  2008   SCAR REVISION Right 2017   Knee, Dr. Luiz Blare   TONSILLECTOMY     as a child   TOTAL KNEE ARTHROPLASTY Right 09/05/2014   Procedure: TOTAL KNEE ARTHROPLASTY;  Surgeon: Harvie Junior, MD;  Location: [facility];  Service: Orthopedics;  Laterality: Right;   TOTAL KNEE ARTHROPLASTY Left 01/10/2022   Procedure: TOTAL KNEE ARTHROPLASTY;  Surgeon: Jodi Geralds, MD;  Location: [facility] ORS;  Service:  Orthopedics;  Laterality: Left;   TOTAL SHOULDER ARTHROPLASTY Left 11/17/2017   Procedure: LEFT TOTAL SHOULDER ARTHROPLASTY;  Surgeon: Jodi Geralds, MD;  Location: [facility] ORS;  Service: Orthopedics;  Laterality: Left;  with block   TUBAL LIGATION  1972    MEDICATIONS:  amiodarone (PACERONE) 200 MG tablet   atorvastatin (LIPITOR) 80 MG tablet   carvedilol (COREG) 6.25 MG tablet   clopidogrel (PLAVIX) 75 MG tablet   ELIQUIS 5 MG TABS tablet   empagliflozin (JARDIANCE) 10 MG TABS tablet   furosemide (LASIX) 20 MG tablet   levofloxacin (LEVAQUIN) 750 MG tablet   levothyroxine (SYNTHROID) 100 MCG tablet   losartan (COZAAR) 25 MG tablet   nitroGLYCERIN (NITROSTAT) 0.4 MG SL tablet   sertraline (ZOLOFT) 100 MG tablet   No current facility-administered medications for this encounter.    Marcille Blanco [facility] Short Stay/Anesthesiology [facility] Phone [phone] 11/07/2022 11:09 AM

## 2022-11-09 ENCOUNTER — Other Ambulatory Visit: Payer: Self-pay

## 2022-11-09 ENCOUNTER — Encounter (HOSPITAL_COMMUNITY)
Admission: RE | Disposition: A | Discharge status: Home / Self Care | Payer: Self-pay | Source: Home / Self Care | Attending: Vascular Surgery

## 2022-11-09 ENCOUNTER — Ambulatory Visit (HOSPITAL_COMMUNITY): Payer: Medicare PPO | Admitting: Anesthesiology

## 2022-11-09 ENCOUNTER — Encounter (HOSPITAL_COMMUNITY): Payer: Self-pay | Admitting: Vascular Surgery

## 2022-11-09 ENCOUNTER — Ambulatory Visit (HOSPITAL_BASED_OUTPATIENT_CLINIC_OR_DEPARTMENT_OTHER): Payer: Medicare PPO | Admitting: Anesthesiology

## 2022-11-09 ENCOUNTER — Ambulatory Visit (HOSPITAL_COMMUNITY)
Admission: RE | Admit: 2022-11-09 | Discharge: 2022-11-09 | Disposition: A | Discharge status: Home / Self Care | Payer: Medicare PPO | Attending: Vascular Surgery | Admitting: Vascular Surgery

## 2022-11-09 DIAGNOSIS — I721 Aneurysm of artery of upper extremity: Secondary | ICD-10-CM | POA: Diagnosis not present

## 2022-11-09 DIAGNOSIS — M1990 Unspecified osteoarthritis, unspecified site: Secondary | ICD-10-CM | POA: Insufficient documentation

## 2022-11-09 DIAGNOSIS — I2510 Atherosclerotic heart disease of native coronary artery without angina pectoris: Secondary | ICD-10-CM

## 2022-11-09 DIAGNOSIS — Z8249 Family history of ischemic heart disease and other diseases of the circulatory system: Secondary | ICD-10-CM | POA: Diagnosis not present

## 2022-11-09 DIAGNOSIS — Z7901 Long term (current) use of anticoagulants: Secondary | ICD-10-CM | POA: Insufficient documentation

## 2022-11-09 DIAGNOSIS — I252 Old myocardial infarction: Secondary | ICD-10-CM

## 2022-11-09 DIAGNOSIS — I10 Essential (primary) hypertension: Secondary | ICD-10-CM | POA: Diagnosis not present

## 2022-11-09 DIAGNOSIS — Z955 Presence of coronary angioplasty implant and graft: Secondary | ICD-10-CM

## 2022-11-09 DIAGNOSIS — I480 Paroxysmal atrial fibrillation: Secondary | ICD-10-CM | POA: Diagnosis not present

## 2022-11-09 DIAGNOSIS — I4891 Unspecified atrial fibrillation: Secondary | ICD-10-CM | POA: Insufficient documentation

## 2022-11-09 DIAGNOSIS — T81718A Complication of other artery following a procedure, not elsewhere classified, initial encounter: Secondary | ICD-10-CM | POA: Diagnosis not present

## 2022-11-09 HISTORY — PX: FALSE ANEURYSM REPAIR: SHX5152

## 2022-11-09 SURGERY — REPAIR, PSEUDOANEURYSM
Anesthesia: General | Site: Arm Lower | Laterality: Right

## 2022-11-09 MED ORDER — HEPARIN 6000 UNIT IRRIGATION SOLUTION
Status: DC | PRN
  Administered 2022-11-09: 1

## 2022-11-09 MED ORDER — ACETAMINOPHEN 10 MG/ML IV SOLN: 1000.0000 mg | Freq: Once | INTRAVENOUS | Status: DC | PRN

## 2022-11-09 MED ORDER — TRAMADOL HCL 50 MG PO TABS: 50.0000 mg | ORAL_TABLET | Freq: Four times a day (QID) | ORAL | 0 refills | Status: AC | PRN

## 2022-11-09 MED ORDER — PHENYLEPHRINE 80 MCG/ML (10ML) SYRINGE FOR IV PUSH (FOR BLOOD PRESSURE SUPPORT)
PREFILLED_SYRINGE | INTRAVENOUS | Status: AC
  Filled 2022-11-09: qty 10

## 2022-11-09 MED ORDER — FENTANYL CITRATE (PF) 100 MCG/2ML IJ SOLN: 25.0000 ug | INTRAMUSCULAR | Status: DC | PRN

## 2022-11-09 MED ORDER — CHLORHEXIDINE GLUCONATE 0.12 % MT SOLN: 15.0000 mL | Freq: Once | OROMUCOSAL | Status: AC

## 2022-11-09 MED ORDER — ACETAMINOPHEN 160 MG/5ML PO SOLN: 1000.0000 mg | Freq: Once | ORAL | Status: DC | PRN

## 2022-11-09 MED ORDER — CHLORHEXIDINE GLUCONATE CLOTH 2 % EX PADS: 6.0000 | MEDICATED_PAD | Freq: Once | CUTANEOUS | Status: DC

## 2022-11-09 MED ORDER — PROPOFOL 10 MG/ML IV BOLUS
INTRAVENOUS | Status: AC
  Filled 2022-11-09: qty 20

## 2022-11-09 MED ORDER — VANCOMYCIN HCL IN DEXTROSE 1-5 GM/200ML-% IV SOLN
INTRAVENOUS | Status: AC
  Administered 2022-11-09: 1000 mg via INTRAVENOUS
  Filled 2022-11-09: qty 200

## 2022-11-09 MED ORDER — PHENYLEPHRINE 80 MCG/ML (10ML) SYRINGE FOR IV PUSH (FOR BLOOD PRESSURE SUPPORT)
PREFILLED_SYRINGE | INTRAVENOUS | Status: DC | PRN
  Administered 2022-11-09 (×4): 80 ug via INTRAVENOUS

## 2022-11-09 MED ORDER — ROCURONIUM BROMIDE 10 MG/ML (PF) SYRINGE
PREFILLED_SYRINGE | INTRAVENOUS | Status: AC
  Filled 2022-11-09: qty 10

## 2022-11-09 MED ORDER — ONDANSETRON HCL 4 MG/2ML IJ SOLN
INTRAMUSCULAR | Status: DC | PRN
  Administered 2022-11-09: 4 mg via INTRAVENOUS

## 2022-11-09 MED ORDER — ONDANSETRON HCL 4 MG/2ML IJ SOLN
INTRAMUSCULAR | Status: AC
  Filled 2022-11-09: qty 2

## 2022-11-09 MED ORDER — CHLORHEXIDINE GLUCONATE 0.12 % MT SOLN
OROMUCOSAL | Status: AC
  Administered 2022-11-09: 15 mL via OROMUCOSAL
  Filled 2022-11-09: qty 15

## 2022-11-09 MED ORDER — PHENYLEPHRINE HCL-NACL 20-0.9 MG/250ML-% IV SOLN
INTRAVENOUS | Status: DC | PRN
  Administered 2022-11-09: 50 ug/min via INTRAVENOUS

## 2022-11-09 MED ORDER — LIDOCAINE 2% (20 MG/ML) 5 ML SYRINGE
INTRAMUSCULAR | Status: AC
  Filled 2022-11-09: qty 5

## 2022-11-09 MED ORDER — 0.9 % SODIUM CHLORIDE (POUR BTL) OPTIME
TOPICAL | Status: DC | PRN
  Administered 2022-11-09: 2000 mL

## 2022-11-09 MED ORDER — VANCOMYCIN HCL IN DEXTROSE 1-5 GM/200ML-% IV SOLN: 1000.0000 mg | INTRAVENOUS | Status: AC

## 2022-11-09 MED ORDER — LIDOCAINE 2% (20 MG/ML) 5 ML SYRINGE
INTRAMUSCULAR | Status: DC | PRN
  Administered 2022-11-09: 60 mg via INTRAVENOUS

## 2022-11-09 MED ORDER — FENTANYL CITRATE (PF) 250 MCG/5ML IJ SOLN
INTRAMUSCULAR | Status: DC | PRN
  Administered 2022-11-09 (×2): 50 ug via INTRAVENOUS

## 2022-11-09 MED ORDER — ACETAMINOPHEN 500 MG PO TABS: 1000.0000 mg | ORAL_TABLET | Freq: Once | ORAL | Status: DC | PRN

## 2022-11-09 MED ORDER — DEXAMETHASONE SODIUM PHOSPHATE 10 MG/ML IJ SOLN
INTRAMUSCULAR | Status: DC | PRN
  Administered 2022-11-09: 8 mg via INTRAVENOUS

## 2022-11-09 MED ORDER — SUGAMMADEX SODIUM 200 MG/2ML IV SOLN
INTRAVENOUS | Status: DC | PRN
  Administered 2022-11-09: 200 mg via INTRAVENOUS

## 2022-11-09 MED ORDER — HEMOSTATIC AGENTS (NO CHARGE) OPTIME
TOPICAL | Status: DC | PRN
  Administered 2022-11-09: 1 via TOPICAL

## 2022-11-09 MED ORDER — HEPARIN 6000 UNIT IRRIGATION SOLUTION
Status: AC
  Filled 2022-11-09: qty 500

## 2022-11-09 MED ORDER — PROPOFOL 10 MG/ML IV BOLUS
INTRAVENOUS | Status: DC | PRN
  Administered 2022-11-09: 120 mg via INTRAVENOUS

## 2022-11-09 MED ORDER — ORAL CARE MOUTH RINSE: 15.0000 mL | Freq: Once | OROMUCOSAL | Status: AC

## 2022-11-09 MED ORDER — MIDAZOLAM HCL 2 MG/2ML IJ SOLN
INTRAMUSCULAR | Status: AC
  Filled 2022-11-09: qty 2

## 2022-11-09 MED ORDER — SODIUM CHLORIDE 0.9 % IV SOLN: INTRAVENOUS | Status: DC

## 2022-11-09 MED ORDER — LACTATED RINGERS IV SOLN: INTRAVENOUS | Status: DC

## 2022-11-09 MED ORDER — DEXAMETHASONE SODIUM PHOSPHATE 10 MG/ML IJ SOLN
INTRAMUSCULAR | Status: AC
  Filled 2022-11-09: qty 1

## 2022-11-09 MED ORDER — FENTANYL CITRATE (PF) 250 MCG/5ML IJ SOLN
INTRAMUSCULAR | Status: AC
  Filled 2022-11-09: qty 5

## 2022-11-09 MED ORDER — OXYCODONE HCL 5 MG PO TABS: 5.0000 mg | ORAL_TABLET | Freq: Once | ORAL | Status: DC | PRN

## 2022-11-09 MED ORDER — ROCURONIUM BROMIDE 10 MG/ML (PF) SYRINGE
PREFILLED_SYRINGE | INTRAVENOUS | Status: DC | PRN
  Administered 2022-11-09: 70 mg via INTRAVENOUS

## 2022-11-09 MED ORDER — OXYCODONE HCL 5 MG/5ML PO SOLN: 5.0000 mg | Freq: Once | ORAL | Status: DC | PRN

## 2022-11-09 MED ORDER — FENTANYL CITRATE (PF) 100 MCG/2ML IJ SOLN
INTRAMUSCULAR | Status: AC
  Filled 2022-11-09: qty 2

## 2022-11-09 SURGICAL SUPPLY — 42 items
APL PRP STRL LF DISP 70% ISPRP (MISCELLANEOUS) ×1
APL SKNCLS STERI-STRIP NONHPOA (GAUZE/BANDAGES/DRESSINGS) ×2
BAG COUNTER SPONGE SURGICOUNT (BAG) ×1 IMPLANT
BAG SPNG CNTER NS LX DISP (BAG) ×1
BENZOIN TINCTURE PRP APPL 2/3 (GAUZE/BANDAGES/DRESSINGS) ×1 IMPLANT
BNDG CMPR 9X4 STRL LF SNTH (GAUZE/BANDAGES/DRESSINGS) ×1
BNDG ESMARK 4X9 LF (GAUZE/BANDAGES/DRESSINGS) IMPLANT
CANISTER SUCT 3000ML PPV (MISCELLANEOUS) ×1 IMPLANT
CANNULA VESSEL 3MM 2 BLNT TIP (CANNULA) IMPLANT
CHLORAPREP W/TINT 26 (MISCELLANEOUS) ×1 IMPLANT
CLIP LIGATING EXTRA MED SLVR (CLIP) IMPLANT
CLIP LIGATING EXTRA SM BLUE (MISCELLANEOUS) IMPLANT
CUFF TOURN SGL QUICK 24 (TOURNIQUET CUFF) ×1
CUFF TRNQT CYL 24X4X16.5-23 (TOURNIQUET CUFF) IMPLANT
DRAIN CHANNEL 15F RND FF W/TCR (WOUND CARE) IMPLANT
ELECT REM PT RETURN 9FT ADLT (ELECTROSURGICAL) ×1
ELECTRODE REM PT RTRN 9FT ADLT (ELECTROSURGICAL) ×1 IMPLANT
EVACUATOR SILICONE 100CC (DRAIN) IMPLANT
GLOVE BIO SURGEON STRL SZ7 (GLOVE) IMPLANT
GLOVE BIO SURGEON STRL SZ8 (GLOVE) ×1 IMPLANT
GOWN STRL REUS W/ TWL LRG LVL3 (GOWN DISPOSABLE) ×2 IMPLANT
GOWN STRL REUS W/ TWL XL LVL3 (GOWN DISPOSABLE) ×1 IMPLANT
GOWN STRL REUS W/TWL LRG LVL3 (GOWN DISPOSABLE) ×2
GOWN STRL REUS W/TWL XL LVL3 (GOWN DISPOSABLE) ×1
HEMOSTAT SNOW SURGICEL 2X4 (HEMOSTASIS) IMPLANT
KIT BASIN OR (CUSTOM PROCEDURE TRAY) ×1 IMPLANT
KIT TURNOVER KIT B (KITS) ×1 IMPLANT
NS IRRIG 1000ML POUR BTL (IV SOLUTION) ×2 IMPLANT
PACK PERIPHERAL VASCULAR (CUSTOM PROCEDURE TRAY) ×1 IMPLANT
PAD ARMBOARD 7.5X6 YLW CONV (MISCELLANEOUS) ×2 IMPLANT
STRIP CLOSURE SKIN 1/2X4 (GAUZE/BANDAGES/DRESSINGS) ×2 IMPLANT
SUT MNCRL AB 4-0 PS2 18 (SUTURE) ×1 IMPLANT
SUT PROLENE 5 0 C 1 24 (SUTURE) ×2 IMPLANT
SUT PROLENE 6 0 BV (SUTURE) ×1 IMPLANT
SUT VIC AB 2-0 CT1 27 (SUTURE) ×2
SUT VIC AB 2-0 CT1 TAPERPNT 27 (SUTURE) ×2 IMPLANT
SUT VIC AB 3-0 SH 27 (SUTURE) ×2
SUT VIC AB 3-0 SH 27X BRD (SUTURE) ×2 IMPLANT
TAPE STRIPS DRAPE STRL (GAUZE/BANDAGES/DRESSINGS) IMPLANT
TOWEL GREEN STERILE (TOWEL DISPOSABLE) ×1 IMPLANT
TRAY FOLEY MTR SLVR 16FR STAT (SET/KITS/TRAYS/PACK) ×1 IMPLANT
WATER STERILE IRR 1000ML POUR (IV SOLUTION) ×1 IMPLANT

## 2022-11-09 NOTE — Transfer of Care (Signed)
Immediate Anesthesia Transfer of Care Note  Patient: Connie [name]  Procedure(s) Performed: 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 (Right: Arm Lower)  Patient Location: PACU  Anesthesia Type:General  Level of Consciousness: awake, alert , and oriented  Airway & Oxygen Therapy: Patient Spontanous Breathing  Post-op Assessment: Report given to RN and Post -op Vital signs reviewed and stable  Post vital signs: Reviewed and stable  Last Vitals:  Vitals Value Taken Time  BP 128/65 11/09/22 1400  Temp 36.6 C 11/09/22 1357  Pulse 61 11/09/22 1401  Resp 9 11/09/22 1401  SpO2 99 % 11/09/22 1401  Vitals shown include unvalidated device data.  Last Pain:  Vitals:   11/09/22 1008  TempSrc:   PainSc: 5          Complications: No notable events documented.

## 2022-11-09 NOTE — Progress Notes (Signed)
Orthopedic Tech Progress Note Patient Details:  Connie [name] [birth year] [id number]  Ortho Devices Type of Ortho Device: Arm sling Ortho Device/Splint Location: RUE Ortho Device/Splint Interventions: Application   Post Interventions Patient Tolerated: Well  [name] 11/09/2022, 3:54 PM

## 2022-11-09 NOTE — Anesthesia Procedure Notes (Signed)
Procedure Name: Intubation Date/Time: 11/09/2022 12:32 PM  Performed by: Marena Chancy, CRNAPre-anesthesia Checklist: Patient identified, Emergency Drugs available, Suction available and Patient being monitored Patient Re-evaluated:Patient Re-evaluated prior to induction Oxygen Delivery Method: Circle System Utilized Preoxygenation: Pre-oxygenation with 100% oxygen Induction Type: IV induction Ventilation: Mask ventilation without difficulty and Oral airway inserted - appropriate to patient size Laryngoscope Size: Hyacinth Meeker and 2 Grade View: Grade I Tube type: Oral Tube size: 7.0 mm Number of attempts: 1 Airway Equipment and Method: Stylet and Oral airway Placement Confirmation: ETT inserted through vocal cords under direct vision, positive ETCO2 and breath sounds checked- equal and bilateral Tube secured with: Tape Dental Injury: Teeth and Oropharynx as per pre-operative assessment

## 2022-11-09 NOTE — Op Note (Signed)
DATE OF SERVICE: 11/09/2022  PATIENT:  Connie [name]  82 y.o. female  PRE-OPERATIVE DIAGNOSIS:  right radial artery pseudoaneurysm  POST-OPERATIVE DIAGNOSIS:  Same  PROCEDURE:   Direct repair of right radial pseudoaneurysm  SURGEON:  Surgeon(s) and Role:    * Leonie Douglas, MD - Primary  ASSISTANT: Loel Dubonnet, PA-C  An experienced assistant was required given the complexity of this procedure and the standard of surgical care. My assistant helped with exposure through counter tension, suctioning, ligation and retraction to better visualize the surgical field.  My assistant expedited sewing during the case by following my sutures. Wherever I use the term "we" in the report, my assistant actively helped me with that portion of the procedure.  ANESTHESIA:   general  EBL: minimal  BLOOD ADMINISTERED:none  DRAINS: none   LOCAL MEDICATIONS USED:  NONE  SPECIMEN:  none  COUNTS: confirmed correct.  TOURNIQUET:    Total Tourniquet Time Documented: Forearm (Right) - 9 minutes Total: Forearm (Right) - 9 minutes   PATIENT DISPOSITION:  PACU - hemodynamically stable.   Delay start of Pharmacological VTE agent (>24hrs) due to surgical blood loss or risk of bleeding: no  INDICATION FOR PROCEDURE: Connie [name] is a 82 y.o. female with right radial artery pseudoaneurysm which is large and has persisted since radial catheterization in the winter. After careful discussion of risks, benefits, and alternatives the patient was offered direct repair. The patient understood and wished to proceed.  OPERATIVE FINDINGS: pinpoint hole in the radial artery repaired with several 6-O prolene suture. Good hemostasis achieved. Good distal doppler exam at completion.  DESCRIPTION OF PROCEDURE: After identification of the patient in the pre-operative holding area, the patient was transferred to the operating room. The patient was positioned supine on the operating room table. Anesthesia was  induced. The right arm and right thigh were prepped and draped in standard fashion. A surgical pause was performed confirming correct patient, procedure, and operative location.  A pneumatic tourniquet was applied to the right arm.  The right arm was exsanguinated with an Esmarch tourniquet.  The pneumatic tourniquet was inflated to 250 mmHg.  The time was marked.  A longitudinal incision was made over the right radial artery pseudoaneurysm capsule.  This was carried down through subtenons tissue.  The capsule was partially skeletonized.  I was not able to identify healthy artery very easily I elected to open the pseudoaneurysm capsule.  First, I thought there was a large defect in the radial artery, as I could not identify the native artery.  I released the pneumatic tourniquet.  I ultimately identified a pinpoint arteriotomy consistent with prior catheterization.  Several 6-0 Prolene sutures were used to close this defect.  Hemostasis was achieved.  The pseudocapsule was partially debrided.  The wound was closed in layers using 3-0 Vicryl and 4-0 Monocryl.  Steri-Strips were applied.  A compressive bandage was applied.  Upon completion of the case instrument and sharps counts were confirmed correct. The patient was transferred to the PACU in good condition. I was present for all portions of the procedure.  FOLLOW UP PLAN: Assuming a normal postoperative course, our PA team will see the patient in 2-3 weeks with no studies.   Rande Brunt. Lenell Antu, MD [facility] Phone Number: [phone] 11/09/2022 1:34 PM

## 2022-11-09 NOTE — H&P (Signed)
See below for H&P details. No pertinent updates. Plan direct repair of pseudoaneurysm in OR today.  Rande Brunt. Lenell Antu, MD FACS [facility] Phone Number: [phone] 11/09/2022 12:14 PM   [facility] [place]  ASSESSMENT / PLAN: 82 y.o. 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  The radial artery is dominant on formal Allen's testing, suggesting an incomplete palmar arch.  The artery will need to be repaired directly, as the neck is too short to permit direct thrombin injection.  I suspect the artery can be repaired primarily, but I will prepare to harvest saphenous vein in case a interposition end-to-end bypass is needed to repair this dominant artery.  I will refer her to a hand specialist to see if this pseudoaneurysm is indeed causing compressive symptoms, or she has some other compressive pathology (e.g. carpal tunnel syndrome).  Will need preoperative risk evaluation by her cardiologist.  Will need to hold her Eliquis preoperatively.  I will continue her dual antiplatelet therapy through the case given her recent coronary stenting.  Will call her in about a month after the above is completed to discuss options for repair.  CHIEF COMPLAINT: Right wrist aneurysm  HISTORY OF PRESENT ILLNESS: Connie [name] is a 82 y.o. 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  Patient's primary care provider, Dr. Chanetta Marshall, noted this in clinic and referred her urgently for evaluation.  The patient underwent cardiac catheterization through right radial approach in the fall 2023.  This has been present since that time.  She reports she had significant bruising after the catheterization which resolved.  She does not report slow growth of the pulsatile mass in her right wrist.  She has no embolic symptoms.  She does report some tingling and paresthesias in her  right hand, but wonders if this is from carpal tunnel, as she likes to use her computer a lot.  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   Multiple, noted on [place]   History of hiatal hernia    Hypertension    Hypothyroidism    Macular degeneration    Psoriatic arthritis    Sinus drainage    UTI (urinary tract infection)     Past Surgical History:  Procedure Laterality Date   BACK SURGERY  2000   Dr Newell Coral   bladder tack  2005   COLONOSCOPY     CORONARY ANGIOPLASTY     CORONARY STENT INTERVENTION N/A 04/11/2022   Procedure: CORONARY STENT INTERVENTION;  Surgeon: Tonny Bollman, MD;  Location: [facility] INVASIVE CV LAB;  Service: Cardiovascular;  Laterality: N/A;   DILATION AND CURETTAGE OF UTERUS     after miscarriage   INSERT / REPLACE / REMOVE PACEMAKER     LEFT HEART CATH AND CORONARY ANGIOGRAPHY N/A 04/07/2022   Procedure: LEFT HEART CATH AND CORONARY ANGIOGRAPHY;  Surgeon: Tonny Bollman, MD;  Location: [facility] INVASIVE CV LAB;  Service: Cardiovascular;  Laterality: N/A;   PACEMAKER IMPLANT N/A 05/27/2022   Procedure: PACEMAKER IMPLANT;  Surgeon: Maurice Small, MD;  Location: [facility] INVASIVE CV LAB;  Service: Cardiovascular;  Laterality: N/A;   RECTAL PROLAPSE REPAIR  2008   SCAR REVISION Right 2017   Knee, Dr. Luiz Blare   TONSILLECTOMY  as a child   TOTAL KNEE ARTHROPLASTY Right 09/05/2014   Procedure: TOTAL KNEE ARTHROPLASTY;  Surgeon: Harvie Junior, MD;  Location: [facility] OR;  Service: Orthopedics;  Laterality: Right;   TOTAL KNEE ARTHROPLASTY Left 01/10/2022   Procedure: TOTAL KNEE ARTHROPLASTY;  Surgeon: Jodi Geralds, MD;  Location: [facility] ORS;  Service: Orthopedics;  Laterality: Left;   TOTAL SHOULDER ARTHROPLASTY Left 11/17/2017   Procedure: LEFT TOTAL SHOULDER ARTHROPLASTY;  Surgeon: Jodi Geralds, MD;  Location: [facility] ORS;  Service: Orthopedics;   Laterality: Left;  with block   TUBAL LIGATION  1972    Family History  Problem Relation Age of Onset   Cancer - Lung Mother    Heart attack Father 38   Breast cancer Neg Hx     Social History   Socioeconomic History   Marital status: Married    Spouse name: [name]   Number of children: 2   Years of education: Not on file   Highest education level: Associate degree: academic program  Occupational History   Occupation: Retired  Tobacco Use   Smoking status: Never   Smokeless tobacco: Never  Vaping Use   Vaping Use: Never used  Substance and Sexual Activity   Alcohol use: Yes    Comment: rarely wine   Drug use: No   Sexual activity: Not on file  Other Topics Concern   Not on file  Social History Narrative   Lives at home with husband.  Son and daughter.    Social Determinants of Health   Financial Resource Strain: Low Risk  (04/11/2022)   Overall Financial Resource Strain (CARDIA)    Difficulty of Paying Living Expenses: Not hard at all  Food Insecurity: No Food Insecurity (05/27/2022)   Hunger Vital Sign    Worried About Running Out of Food in the Last Year: Never true    Ran Out of Food in the Last Year: Never true  Transportation Needs: No Transportation Needs (05/27/2022)   PRAPARE - Administrator, Civil Service (Medical): No    Lack of Transportation (Non-Medical): No  Physical Activity: Not on file  Stress: Not on file  Social Connections: Not on file  Intimate Partner Violence: Not At Risk (05/27/2022)   Humiliation, Afraid, Rape, and Kick questionnaire    Fear of Current or Ex-Partner: No    Emotionally Abused: No    Physically Abused: No    Sexually Abused: No    Allergies  Allergen Reactions   Penicillins Rash and Other (See Comments)    Has patient had a PCN reaction causing immediate rash, facial/tongue/throat swelling, SOB or lightheadedness with hypotension: Yes Has patient had a PCN reaction causing severe rash involving mucus  membranes or skin necrosis: No Has patient had a PCN reaction that required hospitalization: No Has patient had a PCN reaction occurring within the last 10 years: No If all of the above answers are "NO", then may proceed with Cephalosporin use.    Sulfa Antibiotics Rash    Current Facility-Administered Medications  Medication Dose Route Frequency Provider Last Rate Last Admin   0.9 %  sodium chloride infusion   Intravenous Continuous Leonie Douglas, MD 10 mL/hr at 11/09/22 1009 New Bag at 11/09/22 1009   Chlorhexidine Gluconate Cloth 2 % PADS 6 each  6 each Topical Once Leonie Douglas, MD       And   Chlorhexidine Gluconate Cloth 2 % PADS 6 each  6 each Topical Once Leonie Douglas, MD  fentaNYL (SUBLIMAZE) 100 MCG/2ML injection            lactated ringers infusion   Intravenous Continuous Lowella Curb, MD       midazolam (VERSED) 2 MG/2ML injection             PHYSICAL EXAM Vitals:   11/09/22 0944 11/09/22 0945  BP:  (!) 141/55  Pulse: 65   Resp: 18   Temp: 98.5 F (36.9 C)   TempSrc: Oral   SpO2: 98%   Weight: 68 kg   Height: 5' 1.5" (1.562 m)    Wearing elderly woman in no acute distress Regular rate and rhythm Unlabored breathing Palpable pulsatile mass in the right wrist over the course of the radial artery.  There is a palpable pulse in the radial artery proximal and distal to this mass.  There is a palpable ulnar artery.  There is a brisk palmar arch signal which attenuated significantly with compression of the radial artery.   PERTINENT LABORATORY AND RADIOLOGIC DATA  Most recent CBC    Latest Ref Rng & Units 11/04/2022    4:03 PM 05/27/2022    3:55 AM 05/26/2022    3:47 PM  CBC  WBC 4.0 - 10.5 K/uL 9.2  10.4  10.7   Hemoglobin 12.0 - 15.0 g/dL 40.9  81.1  91.4   Hematocrit 36.0 - 46.0 % 41.4  36.0  38.7   Platelets 150 - 400 K/uL 296  317  370      Most recent CMP    Latest Ref Rng & Units 11/04/2022    4:03 PM 05/27/2022    3:55 AM  05/26/2022    3:47 PM  CMP  Glucose 70 - 99 mg/dL 92  86  96   BUN 8 - 23 mg/dL 23  14  14    Creatinine 0.44 - 1.00 mg/dL 7.82  9.56  2.13   Sodium 135 - 145 mmol/L 135  138  136   Potassium 3.5 - 5.1 mmol/L 4.9  4.1  4.3   Chloride 98 - 111 mmol/L 103  105  104   CO2 22 - 32 mmol/L 25  23  21    Calcium 8.9 - 10.3 mg/dL 9.0  8.9  9.0   Total Protein 6.5 - 8.1 g/dL 6.6     Total Bilirubin 0.3 - 1.2 mg/dL 0.9     Alkaline Phos 38 - 126 U/L 127     AST 15 - 41 U/L 49     ALT 0 - 44 U/L 47       Renal function Estimated Creatinine Clearance: 29.1 mL/min (A) (by C-G formula based on SCr of 1.31 mg/dL (H)).  Hgb A1c MFr Bld (%)  Date Value  04/07/2022 5.6    LDL Chol Calc (NIH)  Date Value Ref Range Status  05/06/2022 46 0 - 99 mg/dL Final    Right Allen's test- signal decreases >50% with radial artery compression,  is unaffected with ulnar compression.    Right: No obstruction visualized in the right upper extremity         Sonographic characteristics of a pseudoaneurysm is visualized         at the distal radial artery measuring 1.6cm x 1.2cm x 1.7cm.  *See table(s) above for measurements and observations.   Rande Brunt. Lenell Antu, MD FACS [facility] Phone Number: [phone] 11/09/2022 11:55 AM   Total time spent on preparing this encounter including chart review,  data review, collecting history, examining the patient, coordinating care for this new patient, 60 minutes.  Portions of this report may have been transcribed using voice recognition software.  Every effort has been made to ensure accuracy; however, inadvertent computerized transcription errors may still be present.

## 2022-11-10 ENCOUNTER — Encounter (HOSPITAL_COMMUNITY): Payer: Self-pay | Admitting: Vascular Surgery

## 2022-11-10 NOTE — Anesthesia Postprocedure Evaluation (Signed)
Anesthesia Post Note  Patient: Connie [name]  Procedure(s) Performed: 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 (Right: Arm Lower)     Patient location during evaluation: PACU Anesthesia Type: General Level of consciousness: patient cooperative and awake Pain management: pain level controlled Vital Signs Assessment: post-procedure vital signs reviewed and stable Respiratory status: spontaneous breathing, nonlabored ventilation, respiratory function stable and patient connected to nasal cannula oxygen Cardiovascular status: stable Postop Assessment: no apparent nausea or vomiting Anesthetic complications: no   No notable events documented.  Last Vitals:  Vitals:   11/09/22 1515 11/09/22 1530  BP: 127/60 (!) 138/59  Pulse: 60 62  Resp: 10 12  Temp:  36.7 C  SpO2: 96% 95%    Last Pain:  Vitals:   11/09/22 1357  TempSrc:   PainSc: 0-No pain                 Bindi Klomp

## 2022-11-21 ENCOUNTER — Ambulatory Visit: Payer: Medicare PPO | Admitting: Cardiology

## 2022-11-22 ENCOUNTER — Ambulatory Visit (INDEPENDENT_AMBULATORY_CARE_PROVIDER_SITE_OTHER): Payer: Medicare PPO | Admitting: Physician Assistant

## 2022-11-22 VITALS — BP 113/66 | HR 80 | Temp 98.1°F | Resp 20 | Ht 61.5 in | Wt 150.0 lb

## 2022-11-22 DIAGNOSIS — I729 Aneurysm of unspecified site: Secondary | ICD-10-CM

## 2022-11-22 NOTE — Progress Notes (Signed)
POST OPERATIVE OFFICE NOTE    CC:  F/u for surgery  HPI:  This is a 82 y.o. female who is s/p right radial pseudoaneurysm repair on 11/09/22 by Dr. Lenell Antu.  She developed this after right radial approach for heart cath.    Pt returns today for follow up.  Pt states She has no issues.  No pain, loss of motor or sensation in the right UE.  No steal symptoms.   Allergies  Allergen Reactions   Penicillins Rash and Other (See Comments)    Has patient had a PCN reaction causing immediate rash, facial/tongue/throat swelling, SOB or lightheadedness with hypotension: Yes Has patient had a PCN reaction causing severe rash involving mucus membranes or skin necrosis: No Has patient had a PCN reaction that required hospitalization: No Has patient had a PCN reaction occurring within the last 10 years: No If all of the above answers are "NO", then may proceed with Cephalosporin use.    Sulfa Antibiotics Rash    Current Outpatient Medications  Medication Sig Dispense Refill   amiodarone (PACERONE) 200 MG tablet TAKE 1 TABLET(200 MG) BY MOUTH DAILY 90 tablet 3   atorvastatin (LIPITOR) 80 MG tablet Take 1 tablet (80 mg total) by mouth daily. (Patient taking differently: Take 80 mg by mouth every evening.) 90 tablet 3   carvedilol (COREG) 6.25 MG tablet Take 1 tablet (6.25 mg total) by mouth 2 (two) times daily. 60 tablet 5   clopidogrel (PLAVIX) 75 MG tablet Take 1 tablet (75 mg total) by mouth daily. 90 tablet 3   ELIQUIS 5 MG TABS tablet TAKE 1 TABLET(5 MG) BY MOUTH TWICE DAILY 60 tablet 3   empagliflozin (JARDIANCE) 10 MG TABS tablet TAKE 1 TABLET(10 MG) BY MOUTH DAILY 30 tablet 10   furosemide (LASIX) 20 MG tablet TAKE 1 TABLET BY MOUTH EVERY MONDAY, WEDNESDAY AND FRIDAY AS DIRECTED 15 tablet 11   levofloxacin (LEVAQUIN) 750 MG tablet Take 750 mg by mouth daily.     levothyroxine (SYNTHROID) 100 MCG tablet Take 100 mcg by mouth daily before breakfast.     losartan (COZAAR) 25 MG tablet Take 1  tablet (25 mg total) by mouth daily. 90 tablet 3   nitroGLYCERIN (NITROSTAT) 0.4 MG SL tablet Place 1 tablet (0.4 mg total) under the tongue every 5 (five) minutes x 3 doses as needed for chest pain. 25 tablet 3   sertraline (ZOLOFT) 100 MG tablet Take 100 mg by mouth daily.     traMADol (ULTRAM) 50 MG tablet Take 1 tablet (50 mg total) by mouth every 6 (six) hours as needed. 10 tablet 0   No current facility-administered medications for this visit.     ROS:  See HPI  Physical Exam:    Incision/Extremities:  Well healed with palpable radial pulse, cap refill brisk and grip 5/5.  Sensation intact and equal B. Lungs non labored breathing General no acute distress      Assessment/Plan:  This is a 82 y.o. female who is s/p:Direct repair of right radial pseudoaneurysm.  Pos cardiac cath which was performed in fall of 2023.  OPERATIVE FINDINGS: pinpoint hole in the radial artery repaired with several 6-O prolene suture. Good hemostasis achieved. Good distal doppler exam at completion    -She denies symptoms of steal.  She is on Eliquis Afib and plavix.  She may use her right UE as she tolerates without restrictions.  She will f/u PRN.  She has maintained B UE palpable radial pulses without ischemic skin  changes.    Mosetta Pigeon PA-C  [facility] [phone]   Clinic MD:  Lenell Antu

## 2022-11-24 DIAGNOSIS — H353124 Nonexudative age-related macular degeneration, left eye, advanced atrophic with subfoveal involvement: Secondary | ICD-10-CM | POA: Diagnosis not present

## 2022-12-01 DIAGNOSIS — M67911 Unspecified disorder of synovium and tendon, right shoulder: Secondary | ICD-10-CM | POA: Diagnosis not present

## 2022-12-07 ENCOUNTER — Encounter: Payer: Medicare PPO | Attending: Cardiology

## 2022-12-07 DIAGNOSIS — I495 Sick sinus syndrome: Secondary | ICD-10-CM | POA: Insufficient documentation

## 2022-12-07 LAB — CUP PACEART REMOTE DEVICE CHECK
Battery Remaining Longevity: 158 mo
Battery Voltage: 3.17 V
Brady Statistic AP VP Percent: 0.32 %
Brady Statistic AP VS Percent: 92.29 %
Brady Statistic AS VP Percent: 0.01 %
Brady Statistic AS VS Percent: 7.38 %
Brady Statistic RA Percent Paced: 92.34 %
Brady Statistic RV Percent Paced: 0.42 %
Date Time Interrogation Session: 20240514221709
Implantable Lead Connection Status: 753985
Implantable Lead Connection Status: 753985
Implantable Lead Implant Date: 20231103
Implantable Lead Implant Date: 20231103
Implantable Lead Location: 753859
Implantable Lead Location: 753860
Implantable Lead Model: 3830
Implantable Lead Model: 5076
Implantable Pulse Generator Implant Date: 20231103
Lead Channel Impedance Value: 304 Ohm
Lead Channel Impedance Value: 342 Ohm
Lead Channel Impedance Value: 380 Ohm
Lead Channel Impedance Value: 532 Ohm
Lead Channel Pacing Threshold Amplitude: 0.625 V
Lead Channel Pacing Threshold Amplitude: 1.125 V
Lead Channel Pacing Threshold Pulse Width: 0.4 ms
Lead Channel Pacing Threshold Pulse Width: 0.4 ms
Lead Channel Sensing Intrinsic Amplitude: 2.375 mV
Lead Channel Sensing Intrinsic Amplitude: 2.375 mV
Lead Channel Sensing Intrinsic Amplitude: 4.125 mV
Lead Channel Sensing Intrinsic Amplitude: 4.125 mV
Lead Channel Setting Pacing Amplitude: 1.5 V
Lead Channel Setting Pacing Amplitude: 2 V
Lead Channel Setting Pacing Pulse Width: 0.4 ms
Lead Channel Setting Sensing Sensitivity: 0.6 mV
Zone Setting Status: 755011

## 2022-12-22 NOTE — Progress Notes (Signed)
Remote pacemaker transmission.   

## 2023-01-05 DIAGNOSIS — H35433 Paving stone degeneration of retina, bilateral: Secondary | ICD-10-CM | POA: Diagnosis not present

## 2023-01-05 DIAGNOSIS — H43813 Vitreous degeneration, bilateral: Secondary | ICD-10-CM | POA: Diagnosis not present

## 2023-01-05 DIAGNOSIS — H35033 Hypertensive retinopathy, bilateral: Secondary | ICD-10-CM | POA: Diagnosis not present

## 2023-01-05 DIAGNOSIS — H353124 Nonexudative age-related macular degeneration, left eye, advanced atrophic with subfoveal involvement: Secondary | ICD-10-CM | POA: Diagnosis not present

## 2023-01-05 DIAGNOSIS — H353114 Nonexudative age-related macular degeneration, right eye, advanced atrophic with subfoveal involvement: Secondary | ICD-10-CM | POA: Diagnosis not present

## 2023-01-09 ENCOUNTER — Other Ambulatory Visit: Payer: Self-pay | Admitting: Cardiology

## 2023-01-09 NOTE — Telephone Encounter (Signed)
Prescription refill request for Eliquis received. Indication:afib Last office visit:3/24 Scr:1.31  4/24 Age: 82 Weight:68  kg  Prescription refilled

## 2023-02-16 ENCOUNTER — Ambulatory Visit: Payer: Medicare PPO | Attending: Nurse Practitioner | Admitting: Nurse Practitioner

## 2023-02-16 ENCOUNTER — Encounter: Payer: Self-pay | Admitting: Nurse Practitioner

## 2023-02-16 VITALS — BP 132/82 | HR 76 | Ht 61.0 in | Wt 152.2 lb

## 2023-02-16 DIAGNOSIS — E785 Hyperlipidemia, unspecified: Secondary | ICD-10-CM | POA: Diagnosis not present

## 2023-02-16 DIAGNOSIS — I495 Sick sinus syndrome: Secondary | ICD-10-CM | POA: Diagnosis not present

## 2023-02-16 DIAGNOSIS — I10 Essential (primary) hypertension: Secondary | ICD-10-CM | POA: Diagnosis not present

## 2023-02-16 DIAGNOSIS — R058 Other specified cough: Secondary | ICD-10-CM

## 2023-02-16 DIAGNOSIS — I255 Ischemic cardiomyopathy: Secondary | ICD-10-CM | POA: Diagnosis not present

## 2023-02-16 DIAGNOSIS — I480 Paroxysmal atrial fibrillation: Secondary | ICD-10-CM

## 2023-02-16 DIAGNOSIS — I5022 Chronic systolic (congestive) heart failure: Secondary | ICD-10-CM

## 2023-02-16 DIAGNOSIS — I2510 Atherosclerotic heart disease of native coronary artery without angina pectoris: Secondary | ICD-10-CM | POA: Diagnosis not present

## 2023-02-16 DIAGNOSIS — H353124 Nonexudative age-related macular degeneration, left eye, advanced atrophic with subfoveal involvement: Secondary | ICD-10-CM | POA: Diagnosis not present

## 2023-02-16 DIAGNOSIS — H20042 Secondary noninfectious iridocyclitis, left eye: Secondary | ICD-10-CM | POA: Diagnosis not present

## 2023-02-16 DIAGNOSIS — H35032 Hypertensive retinopathy, left eye: Secondary | ICD-10-CM | POA: Diagnosis not present

## 2023-02-16 DIAGNOSIS — H43812 Vitreous degeneration, left eye: Secondary | ICD-10-CM | POA: Diagnosis not present

## 2023-02-16 MED ORDER — LOSARTAN POTASSIUM 25 MG PO TABS: 12.5000 mg | ORAL_TABLET | Freq: Every day | ORAL | 3 refills | Status: DC

## 2023-02-16 MED ORDER — LOSARTAN POTASSIUM 25 MG PO TABS: 25.0000 mg | ORAL_TABLET | Freq: Every day | ORAL | 3 refills | Status: DC

## 2023-02-16 NOTE — Progress Notes (Signed)
Office Visit    Patient Name: Connie [name] Date of Encounter: 02/16/2023  Primary Care Provider:  Shon Hale, MD Primary Cardiologist:  Little Ishikawa, MD  Chief Complaint    82 year old female with a history of CAD, chronic systolic heart failure, ICM, paroxysmal atrial flutter,  tachycardia-bradycardia syndrome s/p PPM, right radial artery pseudoaneurysm, hypertension, hyperlipidemia, hypothyroidism, GERD, and psoriatic arthritis who presents for follow-up related to CAD, heart failure and atrial fibrillation.  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   Multiple, noted on [place]   History of hiatal hernia    Hypertension    Hypothyroidism    Macular degeneration    Psoriatic arthritis (HCC)    Sinus drainage    UTI (urinary tract infection)    Past Surgical History:  Procedure Laterality Date   BACK SURGERY  2000   Dr Newell Coral   bladder tack  2005   COLONOSCOPY     CORONARY ANGIOPLASTY     CORONARY STENT INTERVENTION N/A 04/11/2022   Procedure: CORONARY STENT INTERVENTION;  Surgeon: Tonny Bollman, MD;  Location: [facility] INVASIVE CV LAB;  Service: Cardiovascular;  Laterality: N/A;   DILATION AND CURETTAGE OF UTERUS     after miscarriage   FALSE ANEURYSM REPAIR Right 11/09/2022   Procedure: 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;  Surgeon: Leonie Douglas, MD;  Location: [facility] OR;  Service: Vascular;  Laterality: Right;   INSERT / REPLACE / REMOVE PACEMAKER     LEFT HEART CATH AND CORONARY ANGIOGRAPHY N/A 04/07/2022   Procedure: LEFT HEART CATH AND CORONARY ANGIOGRAPHY;  Surgeon: Tonny Bollman, MD;  Location: [facility] INVASIVE CV LAB;  Service: Cardiovascular;  Laterality: N/A;   PACEMAKER IMPLANT N/A 05/27/2022   Procedure: PACEMAKER IMPLANT;  Surgeon: Maurice Small, MD;  Location:  [facility] INVASIVE CV LAB;  Service: Cardiovascular;  Laterality: N/A;   RECTAL PROLAPSE REPAIR  2008   SCAR REVISION Right 2017   Knee, Dr. Luiz Blare   TONSILLECTOMY     as a child   TOTAL KNEE ARTHROPLASTY Right 09/05/2014   Procedure: TOTAL KNEE ARTHROPLASTY;  Surgeon: Harvie Junior, MD;  Location: [facility] OR;  Service: Orthopedics;  Laterality: Right;   TOTAL KNEE ARTHROPLASTY Left 01/10/2022   Procedure: TOTAL KNEE ARTHROPLASTY;  Surgeon: Jodi Geralds, MD;  Location: [facility] ORS;  Service: Orthopedics;  Laterality: Left;   TOTAL SHOULDER ARTHROPLASTY Left 11/17/2017   Procedure: LEFT TOTAL SHOULDER ARTHROPLASTY;  Surgeon: Jodi Geralds, MD;  Location: [facility] ORS;  Service: Orthopedics;  Laterality: Left;  with block   TUBAL LIGATION  1972    Allergies  Allergies  Allergen Reactions   Penicillins Rash and Other (See Comments)    Has patient had a PCN reaction causing immediate rash, facial/tongue/throat swelling, SOB or lightheadedness with hypotension: Yes Has patient had a PCN reaction causing severe rash involving mucus membranes or skin necrosis: No Has patient had a PCN reaction that required hospitalization: No Has patient had a PCN reaction occurring within the last 10 years: No If all of the above answers are "NO", then may proceed with Cephalosporin use.    Sulfa Antibiotics Rash     Labs/Other Studies Reviewed    The following studies were reviewed today:  Cardiac Studies & Procedures   CARDIAC CATHETERIZATION  CARDIAC CATHETERIZATION 04/11/2022  Narrative   Mid RCA lesion is 90% stenosed.   Post intervention, there is a 0% residual stenosis.  1.  Successful PTCA and stenting of severe tandem lesions in the RCA using a 3.5 x 38 mm drug-eluting stent 2.  Continued patency of the LAD stent with TIMI-3 flow into the apical LAD  Recommendations: DAPT with clopidogrel x12 months.  Patient has been diagnosed with paroxysmal atrial fibrillation.  We will start apixaban at 2200 today.   Continue aspirin 81 mg x 30 days.  Findings Coronary Findings Diagnostic  Dominance: Right  Left Main The vessel exhibits minimal luminal irregularities.  Left Anterior Descending Non-stenotic Prox LAD to Mid LAD lesion was previously treated. The lesion is heavily thrombotic.  Left Circumflex There is mild diffuse disease throughout the vessel.  First Obtuse Marginal Branch There is mild disease in the vessel.  Right Coronary Artery Prox RCA lesion is 60% stenosed. Mid RCA lesion is 90% stenosed.  Intervention  Prox RCA lesion Stent (Also treats lesions: Mid RCA) CATH VISTA GUIDE 6FR JR4 guide catheter was inserted. Lesion crossed with guidewire using a WIRE COUGAR XT STRL 190CM. Pre-stent angioplasty was performed using a BALLN SAPPHIRE 2.5X15. A drug-eluting stent was successfully placed using a SYNERGY XD 3.50X38. Maximum pressure: 16 atm. Post-stent angioplasty was performed using a BALLN [employer] EUPHORA RX [id number]. Maximum pressure:  20 atm. Post-Intervention Lesion Assessment The intervention was successful. Pre-interventional TIMI flow is 3. Post-intervention TIMI flow is 3. No complications occurred at this lesion. There is a 0% residual stenosis post intervention.  Mid RCA lesion Stent (Also treats lesions: Prox RCA) See details in Prox RCA lesion. Post-Intervention Lesion Assessment The intervention was successful. Pre-interventional TIMI flow is 3. Post-intervention TIMI flow is 3. No complications occurred at this lesion. There is a 0% residual stenosis post intervention.   CARDIAC CATHETERIZATION 04/07/2022  Narrative   Prox LAD to Mid LAD lesion is 100% stenosed.   Prox RCA lesion is 60% stenosed.   Mid RCA lesion is 90% stenosed.   A drug-eluting stent was successfully placed using a SYNERGY XD 2.75X20.   Post intervention, there is a 0% residual stenosis.   LV end diastolic pressure is severely elevated.  1.  Acute anterior STEMI secondary to thrombotic  occlusion of the mid LAD just after the first diagonal, treated with PCI using a 2.75 x 20 mm Synergy DES.  Procedure complicated by no reflow phenomenon likely due to late STEMI presentation with TIMI II flow at the apical portion of the LAD at the completion of the procedure 2.  Severe mid RCA stenosis, recommend staged PCI once she recovers from her anterior infarct 3.  Patent left main and left circumflex with mild nonobstructive plaquing 4.  Severely elevated LVEDP of 28 mmHg consistent with acute systolic heart failure secondary to acute myocardial infarction  Recommendations: ICU care, post MI medical therapy, patient loaded with ticagrelor 180 mg at the completion of the procedure, continue cangrelor x2 hours, consider staged PCI of the RCA prior to discharge pending her clinical course.  Will check 2D echo to assess degree of LV dysfunction.  Findings Coronary Findings Diagnostic  Dominance: Right  Left Main The vessel exhibits minimal luminal irregularities.  Left Anterior Descending Prox LAD to Mid LAD lesion is 100% stenosed. The lesion is heavily thrombotic. The lesion was not previously treated .  Left Circumflex There is mild diffuse disease throughout the vessel.  First Obtuse Marginal Branch There is  mild disease in the vessel.  Right Coronary Artery Prox RCA lesion is 60% stenosed. Mid RCA lesion is 90% stenosed.  Intervention  Prox LAD to Mid LAD lesion Stent A drug-eluting stent was successfully placed using a SYNERGY XD 2.75X20. Post-Intervention Lesion Assessment The intervention was successful. Pre-interventional TIMI flow is 0. Post-intervention TIMI flow is 2. No-reflow occurred during the intervention. IC verapamil was given. There is a 0% residual stenosis post intervention.   STRESS TESTS  MYOCARDIAL PERFUSION IMAGING 08/10/2021  Narrative   The study is normal. The study is low risk.   No ST deviation was noted.   LV perfusion is normal. There is  no evidence of ischemia. There is no evidence of infarction.   Left ventricular function is normal. Nuclear stress EF: 79 %. The left ventricular ejection fraction is hyperdynamic (>65%). End diastolic cavity size is normal. End systolic cavity size is normal.   ECHOCARDIOGRAM  ECHOCARDIOGRAM COMPLETE 05/27/2022  Narrative ECHOCARDIOGRAM REPORT    Patient Name:   MARKIYAH GAHM Date of Exam: 05/27/2022 Medical Rec #:  [id number]          Height:       60.0 in Accession #:    [id number]         Weight:       162.0 lb Date of Birth:  1940-12-08          BSA:          1.707 m Patient Age:    81 years           BP:           131/71 mmHg Patient Gender: F                  HR:           75 bpm. Exam Location:  Inpatient  Procedure: 2D Echo, Color Doppler and Cardiac Doppler  Indications:    AFIB  History:        Patient has prior history of Echocardiogram examinations, most recent 04/07/2022. Risk Factors:Hypertension.  Sonographer:    Gaynell Face Referring Phys: [id number] MICHAEL ANDREW TILLERY  IMPRESSIONS   1. Akinesis of the anteroseptal, apical and distal inferior walls with overall moderate to severe LV dysfunction. 2. Left ventricular ejection fraction, by estimation, is 30 to 35%. The left ventricle has moderate to severely decreased function. The left ventricle demonstrates regional wall motion abnormalities (see scoring diagram/findings for description). Left ventricular diastolic parameters are consistent with Grade I diastolic dysfunction (impaired relaxation). 3. Right ventricular systolic function is normal. The right ventricular size is normal. 4. The mitral valve is normal in structure. Trivial mitral valve regurgitation. No evidence of mitral stenosis. 5. The aortic valve is tricuspid. Aortic valve regurgitation is not visualized. Aortic valve sclerosis is present, with no evidence of aortic valve stenosis. 6. The inferior vena cava is normal in size with greater  than 50% respiratory variability, suggesting right atrial pressure of 3 mmHg.  FINDINGS Left Ventricle: Left ventricular ejection fraction, by estimation, is 30 to 35%. The left ventricle has moderate to severely decreased function. The left ventricle demonstrates regional wall motion abnormalities. The left ventricular internal cavity size was normal in size. There is no left ventricular hypertrophy. Left ventricular diastolic parameters are consistent with Grade I diastolic dysfunction (impaired relaxation).  Right Ventricle: The right ventricular size is normal. Right ventricular systolic function is normal.  Left Atrium: Left atrial size was normal in size.  Right  Atrium: Right atrial size was normal in size.  Pericardium: Trivial pericardial effusion is present.  Mitral Valve: The mitral valve is normal in structure. Mild mitral annular calcification. Trivial mitral valve regurgitation. No evidence of mitral valve stenosis.  Tricuspid Valve: The tricuspid valve is normal in structure. Tricuspid valve regurgitation is trivial. No evidence of tricuspid stenosis.  Aortic Valve: The aortic valve is tricuspid. Aortic valve regurgitation is not visualized. Aortic valve sclerosis is present, with no evidence of aortic valve stenosis. Aortic valve mean gradient measures 3.0 mmHg. Aortic valve peak gradient measures 5.7 mmHg. Aortic valve area, by VTI measures 2.58 cm.  Pulmonic Valve: The pulmonic valve was not well visualized. Pulmonic valve regurgitation is not visualized. No evidence of pulmonic stenosis.  Aorta: The aortic root is normal in size and structure.  Venous: The inferior vena cava is normal in size with greater than 50% respiratory variability, suggesting right atrial pressure of 3 mmHg.  IAS/Shunts: No atrial level shunt detected by color flow Doppler.  Additional Comments: Akinesis of the anteroseptal, apical and distal inferior walls with overall moderate to severe LV  dysfunction.   LEFT VENTRICLE PLAX 2D LVIDd:         4.70 cm      Diastology LVIDs:         3.80 cm      LV e' medial:    7.30 cm/s LV PW:         0.80 cm      LV E/e' medial:  9.8 LV IVS:        0.80 cm      LV e' lateral:   6.06 cm/s LVOT diam:     2.15 cm      LV E/e' lateral: 11.8 LV SV:         62 LV SV Index:   37 LVOT Area:     3.63 cm  LV Volumes (MOD) LV vol d, MOD A4C: 137.0 ml LV vol s, MOD A4C: 86.5 ml LV SV MOD A4C:     137.0 ml  RIGHT VENTRICLE TAPSE (M-mode): 1.8 cm  LEFT ATRIUM             Index        RIGHT ATRIUM           Index LA diam:        3.60 cm 2.11 cm/m   RA Area:     10.70 cm LA Vol (A2C):   58.7 ml 34.39 ml/m  RA Volume:   20.30 ml  11.89 ml/m LA Vol (A4C):   41.4 ml 24.26 ml/m LA Biplane Vol: 50.6 ml 29.65 ml/m AORTIC VALVE AV Area (Vmax):    2.60 cm AV Area (Vmean):   2.46 cm AV Area (VTI):     2.58 cm AV Vmax:           119.00 cm/s AV Vmean:          83.400 cm/s AV VTI:            0.242 m AV Peak Grad:      5.7 mmHg AV Mean Grad:      3.0 mmHg LVOT Vmax:         85.20 cm/s LVOT Vmean:        56.600 cm/s LVOT VTI:          0.172 m LVOT/AV VTI ratio: 0.71  AORTA Ao Root diam: 3.10 cm  MITRAL VALVE MV Area (PHT): 5.54 cm  SHUNTS MV Decel Time: 137 msec     Systemic VTI:  0.17 m MR Peak grad: 50.6 mmHg     Systemic Diam: 2.15 cm MR Vmax:      355.50 cm/s MV E velocity: 71.80 cm/s MV A velocity: 141.00 cm/s MV E/A ratio:  0.51  Olga Millers MD Electronically signed by Olga Millers MD Signature Date/Time: 05/27/2022/2:03:53 PM    Final    MONITORS  LONG TERM MONITOR (3-14 DAYS) 05/25/2022  Narrative   224 pauses, longest lasting 6.8 seconds   12% Afib burden with average rate 105 bpm   Patch Wear Time:  13 days and 21 hours (2023-10-13T10:48:39-0400 to 2023-10-27T08:32:02-399)  Patient had a min HR of 35 bpm, max HR of 149 bpm, and avg HR of 74 bpm. Predominant underlying rhythm was Sinus Rhythm.  Intermittent Bundle Branch Block was present. Atrial Fibrillation/Flutter occurred (12% burden), ranging from 57-149 bpm (avg of 105 bpm), the longest lasting 1 hour 6 mins with an avg rate of 111 bpm. 224 Pauses occurred, the longest lasting 6.8 secs (9 bpm). Atrial Fibrillation/Flutter and Pause were detected within +/- 45 seconds of symptomatic patient event(s). Isolated SVEs were rare (<1.0%), SVE Couplets were rare (<1.0%), and SVE Triplets were rare (<1.0%). Isolated VEs were rare (<1.0%), and no VE Couplets or VE Triplets were present. Inverted QRS complexes possibly due to inverted placement of device. MD notification criteria for Pauses met - report posted prior to notification per account request (MS).   CT SCANS  CT CARDIAC SCORING (SELF PAY ONLY) 04/07/2021  Addendum 04/07/2021  2:43 PM ADDENDUM REPORT: 04/07/2021 14:41  CLINICAL DATA:  Cardiovascular Disease Risk stratification  EXAM: Coronary Calcium Score  TECHNIQUE: A gated, non-contrast computed tomography scan of the heart was performed using 3mm slice thickness. Axial images were analyzed on a dedicated workstation. Calcium scoring of the coronary arteries was performed using the Agatston method.  FINDINGS: Coronary arteries: Normal origins.  Coronary Calcium Score:  Left main: 80  Left anterior descending artery: 175  Left circumflex artery: 83  Right coronary artery: 266  Total: 604  Percentile: 81st  Pericardium: Normal.  Ascending Aorta: Normal caliber.  Non-cardiac: See separate report from [facility].  IMPRESSION: Coronary calcium score of 604 Agatston units. This was 81st percentile for age-, race-, and sex-matched controls.  RECOMMENDATIONS: Coronary artery calcium (CAC) score is a strong predictor of incident coronary heart disease (CHD) and provides predictive information beyond traditional risk factors. CAC scoring is reasonable to use in the decision to withhold, postpone,  or initiate statin therapy in intermediate-risk or selected borderline-risk asymptomatic adults (age 56-75 years and LDL-C >=70 to <190 mg/dL) who do not have diabetes or established atherosclerotic cardiovascular disease (ASCVD).* In intermediate-risk (10-year ASCVD risk >=7.5% to <20%) adults or selected borderline-risk (10-year ASCVD risk >=5% to <7.5%) adults in whom a CAC score is measured for the purpose of making a treatment decision the following recommendations have been made:  If CAC=0, it is reasonable to withhold statin therapy and reassess in 5 to 10 years, as long as higher risk conditions are absent (diabetes mellitus, family history of premature CHD in first degree relatives (males <55 years; females <65 years), cigarette smoking, or LDL >=190 mg/dL).  If CAC is 1 to 99, it is reasonable to initiate statin therapy for patients >=14 years of age.  If CAC is >=100 or >=75th percentile, it is reasonable to initiate statin therapy at any age.  Cardiology referral should be considered for  patients with CAC scores >=400 or >=75th percentile.  *2018 AHA/ACC/AACVPR/AAPA/ABC/ACPM/ADA/AGS/APhA/ASPC/NLA/PCNA Guideline on the Management of Blood Cholesterol: A Report of the American College of Cardiology/American Heart Association Task Force on Clinical Practice Guidelines. J Am Coll Cardiol. 2019;73(24):3168-3209.  Marca Ancona, MD   Electronically Signed By: Marca Ancona M.D. On: 04/07/2021 14:41  Narrative EXAM: OVER-READ INTERPRETATION  CT CHEST  The following report is an over-read performed by radiologist Dr. Irish Lack of [facility] on 04/07/2021. This over-read does not include interpretation of cardiac or coronary anatomy or pathology. The coronary calcium score interpretation by the cardiologist is attached.  COMPARISON:  None.  FINDINGS: Vascular: Scattered calcified plaque in the visualized thoracic aorta without aneurysmal  disease.  Mediastinum/Nodes: Visualized mediastinum and hilar regions demonstrate no lymphadenopathy or masses.  Lungs/Pleura: Visualized lungs show no evidence of pulmonary edema, consolidation, pneumothorax, nodule or pleural fluid.  Upper Abdomen: Cyst in the left lobe of the liver measures approximately 2.5 cm in greatest diameter and demonstrates internal density consistent with a simple cyst. Two other subcentimeter probable cysts are also present in the visualized left lobe.  Musculoskeletal: No chest wall mass or suspicious bone lesions identified.  IMPRESSION: 1. Atherosclerosis of the thoracic aorta without visible aneurysmal disease. 2. Benign appearing cysts in the liver.  Electronically Signed: By: Irish Lack M.D. On: 04/07/2021 12:09         Recent Labs: 04/08/2022: TSH 0.773 04/20/2022: B Natriuretic Peptide 925.5 05/26/2022: Magnesium 2.0 11/04/2022: ALT 47; BUN 23; Creatinine, Ser 1.31; Hemoglobin 13.3; Platelets 296; Potassium 4.9; Sodium 135  Recent Lipid Panel    Component Value Date/Time   CHOL 108 05/06/2022 0959   TRIG 91 05/06/2022 0959   HDL 44 05/06/2022 0959   CHOLHDL 2.5 05/06/2022 0959   CHOLHDL 2.5 04/07/2022 1044   VLDL 21 04/07/2022 1044   LDLCALC 46 05/06/2022 0959    History of Present Illness    82 year old female with the above past medical history including CAD, chronic systolic heart failure, ICM, paroxysmal atrial flutter,  tachycardia-bradycardia syndrome s/p PPM, right radial artery pseudoaneurysm, hypertension, hyperlipidemia, hypothyroidism, GERD, and psoriatic arthritis.   She was referred to Dr. Bjorn Pippin in August 2022 in the setting of palpitations. 7-day Zio patch in 03/2021 revealed atrial flutter 2% burden), echocardiogram in 03/2021 showed normal biventricular function, no significant valvular disease. Calcium score in 03/2021 was 604 (81st percentile). Lexiscan Myoview in 07/2021 showed no evidence of ischemia, EF 79%.   She was hospitalized in 03/2022 in the setting of anterior STEMI. LHC showed thrombotic occlusion to mLAD s/p DES. She was noted to have severe mRCA disease and underwent staged PCI/DES-RCA on 04/11/2022.  Echocardiogram at the time showed EF 35 to 40%, akinesis of LV, entire anterior wall, anteroseptal wall, apical segment, and a inferior apical segment, no LV apical thrombus, RV mildly reduced.  She was diuresed with IV Lasix and started on GDMT.  Her hospital course was complicated by atrial fibrillation with RVR with postconversion pauses up to 6 to 7 seconds in duration.  She was started on amiodarone and Eliquis.  EP was consulted. PPM was deferred.  Outpatient cardiac monitor revealed significant pauses, atrial fibrillation/atrial flutter, tachycardia bradycardia syndrome.  She was again referred to EP.  She was ultimately hospitalized in November 2023 in the setting of tachybradycardia syndrome and underwent PPP implantation with Medtronic dual-chamber pacemaker.  Beta-blocker therapy was reinitiated.  Repeat echocardiogram showed EF 30 to 35%, akinesis of the anteroseptal, apical, distal inferior wall  with overall moderate to severe LV dysfunction, G1 DD, trivial MR. Her procedure was complicated by a right radial pseudoaneurysm.  She was last seen in the office on 10/10/2022 and was doing well from a cardiac standpoint.  She underwent right radial pseudoaneurysm repair per vascular surgery in 10/2022.    She presents today for follow-accompanied by her daughter.  Since her last visit she has been stable from a cardiac standpoint.  She has noted frequent dizziness with position changes, she has poor balance and staggers at times.  She denies palpitations, presyncope or syncope. She mentions that her blood pressure has been consistently low with SBP ranging from the 80s to 100s. She has macular degeneration and has been experiencing progressive vision loss.  She has been depressed by this, but states she is  not interested in taking medication at this time.  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  She also notes mild memory impairment. She also notes an increase in phlegm, productive cough with white, thick mucus.  She denies any edema, PND, orthopnea, weight gain.  She denies symptoms concerning for angina.    Home Medications    Current Outpatient Medications  Medication Sig Dispense Refill   amiodarone (PACERONE) 200 MG tablet TAKE 1 TABLET(200 MG) BY MOUTH DAILY 90 tablet 3   atorvastatin (LIPITOR) 80 MG tablet Take 1 tablet (80 mg total) by mouth daily. (Patient taking differently: Take 80 mg by mouth every evening.) 90 tablet 3   carvedilol (COREG) 6.25 MG tablet Take 1 tablet (6.25 mg total) by mouth 2 (two) times daily. 60 tablet 5   clopidogrel (PLAVIX) 75 MG tablet Take 1 tablet (75 mg total) by mouth daily. 90 tablet 3   ELIQUIS 5 MG TABS tablet TAKE 1 TABLET(5 MG) BY MOUTH TWICE DAILY 60 tablet 3   empagliflozin (JARDIANCE) 10 MG TABS tablet TAKE 1 TABLET(10 MG) BY MOUTH DAILY 30 tablet 10   furosemide (LASIX) 20 MG tablet TAKE 1 TABLET BY MOUTH EVERY MONDAY, WEDNESDAY AND FRIDAY AS DIRECTED 15 tablet 11   levofloxacin (LEVAQUIN) 750 MG tablet Take 750 mg by mouth daily.     levothyroxine (SYNTHROID) 100 MCG tablet Take 100 mcg by mouth daily before breakfast.     losartan (COZAAR) 25 MG tablet Take 0.5 tablets (12.5 mg total) by mouth daily. 45 tablet 3   nitroGLYCERIN (NITROSTAT) 0.4 MG SL tablet Place 1 tablet (0.4 mg total) under the tongue every 5 (five) minutes x 3 doses as needed for chest pain. 25 tablet 3   sertraline (ZOLOFT) 100 MG tablet Take 100 mg by mouth daily.     traMADol (ULTRAM) 50 MG tablet Take 1 tablet (50 mg total) by mouth every 6 (six) hours as needed. 10 tablet 0   No current facility-administered medications for this visit.     Review of Systems   She denies chest pain, palpitations, dyspnea, pnd,  orthopnea, n, v, syncope, edema, weight gain, or early satiety. All other systems reviewed and are otherwise negative except as noted above.   Physical Exam    VS:  BP 132/82 (BP Location: Left Arm, Patient Position: Sitting, Cuff Size: Normal)   Pulse 76   Ht 5\' 1"  (1.549 m)   Wt 152 lb 3.2 oz (69 kg)   SpO2 97%   BMI 28.76 kg/m  , BMI Body mass index is 28.76 kg/m. STOP-Bang Score:  4      GEN: Well nourished,  well developed, in no acute distress. HEENT: normal. Neck: Supple, no JVD, carotid bruits, or masses. Cardiac: RRR, no murmurs, rubs, or gallops. No clubbing, cyanosis, edema.  Radials/DP/PT 2+ and equal bilaterally.  Respiratory:  Respirations regular and unlabored, clear to auscultation bilaterally. GI: Soft, nontender, nondistended, BS + x 4. MS: no deformity or atrophy. Skin: warm and dry, no rash. Neuro:  Strength and sensation are intact. Psych: Normal affect.  Accessory Clinical Findings    ECG personally reviewed by me today -    - no acute changes.   Lab Results  Component Value Date   WBC 9.2 11/04/2022   HGB 13.3 11/04/2022   HCT 41.4 11/04/2022   MCV 84.3 11/04/2022   PLT 296 11/04/2022   Lab Results  Component Value Date   CREATININE 1.31 (H) 11/04/2022   BUN 23 11/04/2022   NA 135 11/04/2022   K 4.9 11/04/2022   CL 103 11/04/2022   CO2 25 11/04/2022   Lab Results  Component Value Date   ALT 47 (H) 11/04/2022   AST 49 (H) 11/04/2022   ALKPHOS 127 (H) 11/04/2022   BILITOT 0.9 11/04/2022   Lab Results  Component Value Date   CHOL 108 05/06/2022   HDL 44 05/06/2022   LDLCALC 46 05/06/2022   TRIG 91 05/06/2022   CHOLHDL 2.5 05/06/2022    Lab Results  Component Value Date   HGBA1C 5.6 04/07/2022    Assessment & Plan    1. Dizziness/hypertension/orthostatic hypotension: She notes recent intermittent dizziness primarily with position changes and when walking.  She denies palpitations, presyncope, or syncope.  She has checked her blood  pressure when she feels dizzy and has noted SBP in the 80s to 90s.  BP stable in office today.  Suspect hypotension/orthostatic hypotension primarily driving her symptoms.  Will decrease losartan to 12.5 mg daily.  I advised her to hold her Lasix for SBP less than 100.  Declined orthostatics in office today, will likely perform orthostatic's at next office visit.  Encouraged gradual position changes, adequate hydration.  Continue to monitor BP and report SBP consistently less than 90.  Will likely have to decrease medications further.  Otherwise, for now, continue current antihypertensive regimen.  2. CAD: S/p DES-mLAD, DES-RCA on 03/2022. Stable with no anginal symptoms. Encouraged increased activity as tolerated. No ASA in the setting of Eliquis. Continue Plavix, carvedilol, losartan, Jardiance, Lasix, and Lipitor.    2. Chronic systolic heart failure/ICM/productive cough: Most recent echo showed EF 30 to 35%, akinesis of the anteroseptal, apical, distal inferior wall with overall moderate to severe LV dysfunction, G1 DD, trivial MR.  She does note recent increase in phlegm, productive cough with white thick mucus. Euvolemic and well compensated on exam.  Given recent phlegm, productive cough, will check chest x-ray. Consider repeat echo at next follow-up visit.  For now, continue current medications as above.    3. 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  Most recent device interrogation showed 0.3% A-fib burden, normal device function.  Denies any palpitations, dizziness.  Labs within the past 6 months including CBC, CMET, TSH were stable.  Repeat chest x-ray pending as below.  Will plan  to update routine monitoring labs including TSH, CBC, CMET at next follow-up visit. She follows regularly with an eye doctor.  She does have progressive vision loss in the setting of macular degeneration. Continue Eliquis, amiodarone, carvedilol.   4. Hyperlipidemia: LDL was 46 in  04/2022.  Continue Lipitor.   5. 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  Right radial pulse 2+, she denies any concerns.   6. Disposition: Follow-up in 6 weeks.       Joylene Grapes, NP 02/16/2023, 10:08 AM

## 2023-02-16 NOTE — Patient Instructions (Signed)
Medication Instructions:  Your physician has recommended you make the following change in your medication: DECREASE Losartan (Cozaar) 25mg  1/2 (one half) tablet by mouth daily.  *If you need a refill on your cardiac medications before your next appointment, please call your pharmacy*   Lab Work: None  If you have labs (blood work) drawn today and your tests are completely normal, you will receive your results only by: MyChart Message (if you have MyChart) OR A paper copy in the mail If you have any lab test that is abnormal or we need to change your treatment, we will call you to review the results.   Testing/Procedures: A chest x-ray takes a picture of the organs and structures inside the chest, including the heart, lungs, and blood vessels. This test can show several things, including, whether the heart is enlarges; whether fluid is building up in the lungs; and whether pacemaker / defibrillator leads are still in place.  You do not have to have an appointment. You may walk-in up until 4:30pm at [facility] at [address] [place]   Follow-Up: At [facility], you and your health needs are our priority.  As part of our continuing mission to provide you with exceptional heart care, we have created designated Provider Care Teams.  These Care Teams include your primary Cardiologist (physician) and Advanced Practice Providers (APPs -  Physician Assistants and Nurse Practitioners) who all work together to provide you with the care you need, when you need it.  We recommend signing up for the patient portal called "MyChart".  Sign up information is provided on this After Visit Summary.  MyChart is used to connect with patients for Virtual Visits (Telemedicine).  Patients are able to view lab/test results, encounter notes, upcoming appointments, etc.  Non-urgent messages can be sent to your provider as well.   To learn more about what you can do with MyChart, go to  [email or web address].    Your next appointment:   6 week(s)  Provider:   Bernadene Person, NP        Other Instructions Your physician has requested that you regularly monitor and record your blood pressure readings at home. Please use the same machine at the same time of day to check your readings and record them to bring to your follow-up visit.   Please monitor blood pressures and keep a log of your readings. IF YOUR SYSTOLIC (TOP NUMBER) OF BLOOD PRESSURE IS LES THAN 100, HOLD YOUR LASIX DOSE. PLEASE NOTIFY THE PROVIDER IF YOU HAVE A SYSTOLIC (TOP NUMBER) IS CONSISTANTLY LOWER THAN 90.   Make sure to check 2 hours after your medications.    AVOID these things for 30 minutes before checking your blood pressure: No Drinking caffeine. No Drinking alcohol. No Eating. No Smoking. No Exercising.   Five minutes before checking your blood pressure: Pee. Sit in a dining chair. Avoid sitting in a soft couch or armchair. Be quiet. Do not talk

## 2023-02-17 ENCOUNTER — Encounter: Payer: Medicare PPO | Admitting: Rheumatology

## 2023-02-17 ENCOUNTER — Ambulatory Visit
Admission: RE | Admit: 2023-02-17 | Discharge: 2023-02-17 | Disposition: A | Discharge status: Home / Self Care | Payer: Medicare PPO | Source: Ambulatory Visit | Attending: Nurse Practitioner | Admitting: Nurse Practitioner

## 2023-02-17 DIAGNOSIS — I2510 Atherosclerotic heart disease of native coronary artery without angina pectoris: Secondary | ICD-10-CM

## 2023-02-17 DIAGNOSIS — I5022 Chronic systolic (congestive) heart failure: Secondary | ICD-10-CM

## 2023-02-17 DIAGNOSIS — I480 Paroxysmal atrial fibrillation: Secondary | ICD-10-CM

## 2023-02-17 DIAGNOSIS — J439 Emphysema, unspecified: Secondary | ICD-10-CM | POA: Diagnosis not present

## 2023-02-17 DIAGNOSIS — E785 Hyperlipidemia, unspecified: Secondary | ICD-10-CM

## 2023-02-17 DIAGNOSIS — I255 Ischemic cardiomyopathy: Secondary | ICD-10-CM

## 2023-02-17 DIAGNOSIS — I495 Sick sinus syndrome: Secondary | ICD-10-CM

## 2023-02-17 DIAGNOSIS — I10 Essential (primary) hypertension: Secondary | ICD-10-CM

## 2023-02-17 DIAGNOSIS — R058 Other specified cough: Secondary | ICD-10-CM

## 2023-02-22 DIAGNOSIS — K219 Gastro-esophageal reflux disease without esophagitis: Secondary | ICD-10-CM | POA: Diagnosis not present

## 2023-02-22 DIAGNOSIS — I480 Paroxysmal atrial fibrillation: Secondary | ICD-10-CM | POA: Diagnosis not present

## 2023-02-22 DIAGNOSIS — D6869 Other thrombophilia: Secondary | ICD-10-CM | POA: Diagnosis not present

## 2023-02-22 DIAGNOSIS — E7800 Pure hypercholesterolemia, unspecified: Secondary | ICD-10-CM | POA: Diagnosis not present

## 2023-02-22 DIAGNOSIS — E039 Hypothyroidism, unspecified: Secondary | ICD-10-CM | POA: Diagnosis not present

## 2023-02-22 DIAGNOSIS — I10 Essential (primary) hypertension: Secondary | ICD-10-CM | POA: Diagnosis not present

## 2023-02-22 DIAGNOSIS — F321 Major depressive disorder, single episode, moderate: Secondary | ICD-10-CM | POA: Diagnosis not present

## 2023-02-22 DIAGNOSIS — Z0000 Encounter for general adult medical examination without abnormal findings: Secondary | ICD-10-CM | POA: Diagnosis not present

## 2023-02-22 DIAGNOSIS — L4050 Arthropathic psoriasis, unspecified: Secondary | ICD-10-CM | POA: Diagnosis not present

## 2023-03-01 ENCOUNTER — Other Ambulatory Visit (HOSPITAL_BASED_OUTPATIENT_CLINIC_OR_DEPARTMENT_OTHER): Payer: Self-pay | Admitting: *Deleted

## 2023-03-01 MED ORDER — CARVEDILOL 6.25 MG PO TABS: 6.2500 mg | ORAL_TABLET | Freq: Two times a day (BID) | ORAL | 11 refills | Status: DC

## 2023-03-06 ENCOUNTER — Telehealth: Payer: Self-pay

## 2023-03-06 NOTE — Telephone Encounter (Signed)
Spoke with pt. Pt was notified of xray results. Pt advised to see PCP to recommendations for lung changes.

## 2023-03-07 DIAGNOSIS — R1031 Right lower quadrant pain: Secondary | ICD-10-CM | POA: Diagnosis not present

## 2023-03-07 DIAGNOSIS — F325 Major depressive disorder, single episode, in full remission: Secondary | ICD-10-CM | POA: Diagnosis not present

## 2023-03-07 DIAGNOSIS — R109 Unspecified abdominal pain: Secondary | ICD-10-CM | POA: Diagnosis not present

## 2023-03-08 ENCOUNTER — Encounter: Payer: Medicare PPO | Attending: Cardiology

## 2023-03-08 ENCOUNTER — Other Ambulatory Visit (HOSPITAL_COMMUNITY): Payer: Self-pay | Admitting: Family Medicine

## 2023-03-08 DIAGNOSIS — I495 Sick sinus syndrome: Secondary | ICD-10-CM

## 2023-03-08 DIAGNOSIS — R1011 Right upper quadrant pain: Secondary | ICD-10-CM

## 2023-03-09 ENCOUNTER — Ambulatory Visit (HOSPITAL_BASED_OUTPATIENT_CLINIC_OR_DEPARTMENT_OTHER)
Admission: RE | Admit: 2023-03-09 | Discharge: 2023-03-09 | Disposition: A | Discharge status: Home / Self Care | Payer: Medicare PPO | Source: Ambulatory Visit | Attending: Family Medicine | Admitting: Family Medicine

## 2023-03-09 DIAGNOSIS — K7689 Other specified diseases of liver: Secondary | ICD-10-CM | POA: Diagnosis not present

## 2023-03-09 DIAGNOSIS — R1011 Right upper quadrant pain: Secondary | ICD-10-CM | POA: Diagnosis not present

## 2023-03-09 DIAGNOSIS — N281 Cyst of kidney, acquired: Secondary | ICD-10-CM | POA: Diagnosis not present

## 2023-03-16 DIAGNOSIS — H20042 Secondary noninfectious iridocyclitis, left eye: Secondary | ICD-10-CM | POA: Diagnosis not present

## 2023-03-16 DIAGNOSIS — H353134 Nonexudative age-related macular degeneration, bilateral, advanced atrophic with subfoveal involvement: Secondary | ICD-10-CM | POA: Diagnosis not present

## 2023-03-16 DIAGNOSIS — H35032 Hypertensive retinopathy, left eye: Secondary | ICD-10-CM | POA: Diagnosis not present

## 2023-03-16 DIAGNOSIS — H43812 Vitreous degeneration, left eye: Secondary | ICD-10-CM | POA: Diagnosis not present

## 2023-03-21 ENCOUNTER — Ambulatory Visit: Payer: Medicare PPO | Admitting: Rheumatology

## 2023-03-22 NOTE — Progress Notes (Signed)
Remote pacemaker transmission.   

## 2023-03-24 ENCOUNTER — Other Ambulatory Visit (HOSPITAL_BASED_OUTPATIENT_CLINIC_OR_DEPARTMENT_OTHER): Payer: Self-pay

## 2023-03-24 DIAGNOSIS — F331 Major depressive disorder, recurrent, moderate: Secondary | ICD-10-CM | POA: Diagnosis not present

## 2023-03-24 DIAGNOSIS — I10 Essential (primary) hypertension: Secondary | ICD-10-CM | POA: Diagnosis not present

## 2023-03-24 MED ORDER — FLUAD 0.5 ML IM SUSY
PREFILLED_SYRINGE | Freq: Once | INTRAMUSCULAR | 0 refills | Status: AC
  Filled 2023-03-24: qty 0.5, 1d supply, fill #0

## 2023-03-29 ENCOUNTER — Other Ambulatory Visit (HOSPITAL_BASED_OUTPATIENT_CLINIC_OR_DEPARTMENT_OTHER): Payer: Self-pay

## 2023-03-30 ENCOUNTER — Other Ambulatory Visit: Payer: Self-pay | Admitting: Cardiology

## 2023-03-30 NOTE — Progress Notes (Signed)
Cardiology Office Note:  .   Date:  03/31/2023  ID:  Connie [name], DOB 10-17-1940, MRN [id number] PCP: Shon Hale, MD  [facility] Providers Cardiologist:  Little Ishikawa, MD Electrophysiologist:  Maurice Small, MD    History of Present Illness: .   Connie [name] is a 82 y.o. female with past medical history of CAD, chronic systolic heart failure, ICM, paroxysmal atrial flutter, tachycardia-bradycardia syndrome s/p PPM, right radial artery pseudoaneurysm, hypertension, hyperlipidemia, hypothyroidism, GERD, and psoriatic arthritis who presents today for follow-up related to CAD, heart failure and atrial fibrillation.  In August 2022 she was referred to Dr. Bjorn Pippin in the setting of palpitations.  A 7-day Zio patch in 03/2021 revealed atrial flutter with a 2% burden, echocardiogram in 03/2021 showed normal biventricular function, no significant valvular disease.  She had calcium scoring in 03/2021 that was 604 (81st percentile).  Lexiscan Myoview in 07/2021 showed no evidence of ischemia, EF 79%.  She was hospitalized in 03/2022 in the setting of anterior STEMI.  LHC showed thrombotic occlusion to M LAD s/p DES.  She was noted to have severe mRCA disease and underwent staged PCI/DES to the RCA on 04/11/2022.  Echocardiogram at the time showed an EF of 35 to 40%, akinesis of LV,, entire anterior wall, anteroseptal wall, apical segment, and a inferior apical segment, no LV apical thrombus, RV mildly reduced.  She underwent IV diuresis with Lasix and started on GDMT.  During her admission she went into atrial fibrillation with RVR with postconversion pauses up to 6 to 7 seconds in duration.  She was started on amiodarone and Eliquis.  EP was consulted.  PPM was deferred at that time.  She wore an outpatient cardiac monitor which revealed significant pauses, atrial fibrillation/atrial flutter, tachycardia-bradycardia syndrome.  She was again referred to EP.  In November 2023  she was hospitalized in the setting of tachybradycardia syndrome and underwent PPM implantation with Medtronic dual-chamber pacemaker.  She was restarted on beta-blocker therapy.  Repeat echocardiogram showed EF 30 to 35%, akinesis of the anteroseptal, apical, distal inferior wall with overall moderate to severe LV dysfunction, G1 DD, trivial MR.  Her procedure was complicated by a right radial pseudoaneurysm.  In 10/2022 she underwent right radial pseudoaneurysm repair.  She was last seen in office on 02/16/2023, she noted frequent dizziness with position changes, poor balance with staggering at times.  She noted that her blood pressure had been consistently low with SBP ranging from the 80s to 100s.  Today she presents for follow up regarding her blood pressure, she reports that she is doing well.  She does report ongoing dizziness with position changes and poor balance.  She questions if some of her poor balance could be related to her macular degeneration.  She recently saw her PCP and her losartan was discontinued for low blood pressures.  She has not yet noticed an improvement in her dizziness since discontinuation of losartan.  She has been monitoring her blood pressure at home she averages 105/65, ranges from 68/52 to 116/73.  Today she also reports some increased sinus congestion, possibly contributing to her phlegm production although she does note this has been improved recently.  Notes that she is not very active, although when she takes her Lasix she has frequent trips to the bathroom.  Recent labs  03/08/2023 Hgb 12.6, HCT 40.4, PLT 280, creatinine 1.13, sodium 137, potassium 4.5, AST 52, ALT 53 02/22/2023 TSH 0.680, cholesterol 137, HDL 60, triglycerides 93,  LDL 60.  ROS: Today she denies chest pain, shortness of breath, lower extremity edema, fatigue, palpitations, melena, hematuria, hemoptysis, diaphoresis, weakness, syncope, orthopnea, and PND.   Studies Reviewed: Marland Kitchen   EKG  Interpretation Date/Time:  Friday March 31 2023 08:23:52 EDT Ventricular Rate:  60 PR Interval:  192 QRS Duration:  132 QT Interval:  458 QTC Calculation: 458 R Axis:   -79  Text Interpretation: Atrial-paced rhythm Left axis deviation Right bundle branch block Anteroseptal infarct (cited on or before 27-May-2022) When compared with ECG of 31-Mar-2023 08:15, No significant change was found Confirmed by Reather Littler [phone]) on 03/31/2023 1:06:19 PM   Cardiac Studies & Procedures   CARDIAC CATHETERIZATION  CARDIAC CATHETERIZATION 04/11/2022  Narrative   Mid RCA lesion is 90% stenosed.   Post intervention, there is a 0% residual stenosis.  1.  Successful PTCA and stenting of severe tandem lesions in the RCA using a 3.5 x 38 mm drug-eluting stent 2.  Continued patency of the LAD stent with TIMI-3 flow into the apical LAD  Recommendations: DAPT with clopidogrel x12 months.  Patient has been diagnosed with paroxysmal atrial fibrillation.  We will start apixaban at 2200 today.  Continue aspirin 81 mg x 30 days.  Findings Coronary Findings Diagnostic  Dominance: Right  Left Main The vessel exhibits minimal luminal irregularities.  Left Anterior Descending Non-stenotic Prox LAD to Mid LAD lesion was previously treated. The lesion is heavily thrombotic.  Left Circumflex There is mild diffuse disease throughout the vessel.  First Obtuse Marginal Branch There is mild disease in the vessel.  Right Coronary Artery Prox RCA lesion is 60% stenosed. Mid RCA lesion is 90% stenosed.  Intervention  Prox RCA lesion Stent (Also treats lesions: Mid RCA) CATH VISTA GUIDE 6FR JR4 guide catheter was inserted. Lesion crossed with guidewire using a WIRE COUGAR XT STRL 190CM. Pre-stent angioplasty was performed using a BALLN SAPPHIRE 2.5X15. A drug-eluting stent was successfully placed using a SYNERGY XD 3.50X38. Maximum pressure: 16 atm. Post-stent angioplasty was performed using a BALLN Orwin  EUPHORA RX [id number]. Maximum pressure:  20 atm. Post-Intervention Lesion Assessment The intervention was successful. Pre-interventional TIMI flow is 3. Post-intervention TIMI flow is 3. No complications occurred at this lesion. There is a 0% residual stenosis post intervention.  Mid RCA lesion Stent (Also treats lesions: Prox RCA) See details in Prox RCA lesion. Post-Intervention Lesion Assessment The intervention was successful. Pre-interventional TIMI flow is 3. Post-intervention TIMI flow is 3. No complications occurred at this lesion. There is a 0% residual stenosis post intervention.   CARDIAC CATHETERIZATION 04/07/2022  Narrative   Prox LAD to Mid LAD lesion is 100% stenosed.   Prox RCA lesion is 60% stenosed.   Mid RCA lesion is 90% stenosed.   A drug-eluting stent was successfully placed using a SYNERGY XD 2.75X20.   Post intervention, there is a 0% residual stenosis.   LV end diastolic pressure is severely elevated.  1.  Acute anterior STEMI secondary to thrombotic occlusion of the mid LAD just after the first diagonal, treated with PCI using a 2.75 x 20 mm Synergy DES.  Procedure complicated by no reflow phenomenon likely due to late STEMI presentation with TIMI II flow at the apical portion of the LAD at the completion of the procedure 2.  Severe mid RCA stenosis, recommend staged PCI once she recovers from her anterior infarct 3.  Patent left main and left circumflex with mild nonobstructive plaquing 4.  Severely elevated LVEDP of 28 mmHg consistent  with acute systolic heart failure secondary to acute myocardial infarction  Recommendations: ICU care, post MI medical therapy, patient loaded with ticagrelor 180 mg at the completion of the procedure, continue cangrelor x2 hours, consider staged PCI of the RCA prior to discharge pending her clinical course.  Will check 2D echo to assess degree of LV dysfunction.  Findings Coronary Findings Diagnostic  Dominance: Right  Left  Main The vessel exhibits minimal luminal irregularities.  Left Anterior Descending Prox LAD to Mid LAD lesion is 100% stenosed. The lesion is heavily thrombotic. The lesion was not previously treated .  Left Circumflex There is mild diffuse disease throughout the vessel.  First Obtuse Marginal Branch There is mild disease in the vessel.  Right Coronary Artery Prox RCA lesion is 60% stenosed. Mid RCA lesion is 90% stenosed.  Intervention  Prox LAD to Mid LAD lesion Stent A drug-eluting stent was successfully placed using a SYNERGY XD 2.75X20. Post-Intervention Lesion Assessment The intervention was successful. Pre-interventional TIMI flow is 0. Post-intervention TIMI flow is 2. No-reflow occurred during the intervention. IC verapamil was given. There is a 0% residual stenosis post intervention.   STRESS TESTS  MYOCARDIAL PERFUSION IMAGING 08/10/2021  Narrative   The study is normal. The study is low risk.   No ST deviation was noted.   LV perfusion is normal. There is no evidence of ischemia. There is no evidence of infarction.   Left ventricular function is normal. Nuclear stress EF: 79 %. The left ventricular ejection fraction is hyperdynamic (>65%). End diastolic cavity size is normal. End systolic cavity size is normal.   ECHOCARDIOGRAM  ECHOCARDIOGRAM COMPLETE 05/27/2022  Narrative ECHOCARDIOGRAM REPORT    Patient Name:   JEILY HOLDREN Date of Exam: 05/27/2022 Medical Rec #:  [id number]          Height:       60.0 in Accession #:    [id number]         Weight:       162.0 lb Date of Birth:  01-08-1941          BSA:          1.707 m Patient Age:    81 years           BP:           131/71 mmHg Patient Gender: F                  HR:           75 bpm. Exam Location:  Inpatient  Procedure: 2D Echo, Color Doppler and Cardiac Doppler  Indications:    AFIB  History:        Patient has prior history of Echocardiogram examinations, most recent 04/07/2022. Risk  Factors:Hypertension.  Sonographer:    Gaynell Face Referring Phys: [id number] MICHAEL ANDREW TILLERY  IMPRESSIONS   1. Akinesis of the anteroseptal, apical and distal inferior walls with overall moderate to severe LV dysfunction. 2. Left ventricular ejection fraction, by estimation, is 30 to 35%. The left ventricle has moderate to severely decreased function. The left ventricle demonstrates regional wall motion abnormalities (see scoring diagram/findings for description). Left ventricular diastolic parameters are consistent with Grade I diastolic dysfunction (impaired relaxation). 3. Right ventricular systolic function is normal. The right ventricular size is normal. 4. The mitral valve is normal in structure. Trivial mitral valve regurgitation. No evidence of mitral stenosis. 5. The aortic valve is tricuspid. Aortic valve regurgitation is not visualized. Aortic valve sclerosis  is present, with no evidence of aortic valve stenosis. 6. The inferior vena cava is normal in size with greater than 50% respiratory variability, suggesting right atrial pressure of 3 mmHg.  FINDINGS Left Ventricle: Left ventricular ejection fraction, by estimation, is 30 to 35%. The left ventricle has moderate to severely decreased function. The left ventricle demonstrates regional wall motion abnormalities. The left ventricular internal cavity size was normal in size. There is no left ventricular hypertrophy. Left ventricular diastolic parameters are consistent with Grade I diastolic dysfunction (impaired relaxation).  Right Ventricle: The right ventricular size is normal. Right ventricular systolic function is normal.  Left Atrium: Left atrial size was normal in size.  Right Atrium: Right atrial size was normal in size.  Pericardium: Trivial pericardial effusion is present.  Mitral Valve: The mitral valve is normal in structure. Mild mitral annular calcification. Trivial mitral valve regurgitation. No evidence  of mitral valve stenosis.  Tricuspid Valve: The tricuspid valve is normal in structure. Tricuspid valve regurgitation is trivial. No evidence of tricuspid stenosis.  Aortic Valve: The aortic valve is tricuspid. Aortic valve regurgitation is not visualized. Aortic valve sclerosis is present, with no evidence of aortic valve stenosis. Aortic valve mean gradient measures 3.0 mmHg. Aortic valve peak gradient measures 5.7 mmHg. Aortic valve area, by VTI measures 2.58 cm.  Pulmonic Valve: The pulmonic valve was not well visualized. Pulmonic valve regurgitation is not visualized. No evidence of pulmonic stenosis.  Aorta: The aortic root is normal in size and structure.  Venous: The inferior vena cava is normal in size with greater than 50% respiratory variability, suggesting right atrial pressure of 3 mmHg.  IAS/Shunts: No atrial level shunt detected by color flow Doppler.  Additional Comments: Akinesis of the anteroseptal, apical and distal inferior walls with overall moderate to severe LV dysfunction.   LEFT VENTRICLE PLAX 2D LVIDd:         4.70 cm      Diastology LVIDs:         3.80 cm      LV e' medial:    7.30 cm/s LV PW:         0.80 cm      LV E/e' medial:  9.8 LV IVS:        0.80 cm      LV e' lateral:   6.06 cm/s LVOT diam:     2.15 cm      LV E/e' lateral: 11.8 LV SV:         62 LV SV Index:   37 LVOT Area:     3.63 cm  LV Volumes (MOD) LV vol d, MOD A4C: 137.0 ml LV vol s, MOD A4C: 86.5 ml LV SV MOD A4C:     137.0 ml  RIGHT VENTRICLE TAPSE (M-mode): 1.8 cm  LEFT ATRIUM             Index        RIGHT ATRIUM           Index LA diam:        3.60 cm 2.11 cm/m   RA Area:     10.70 cm LA Vol (A2C):   58.7 ml 34.39 ml/m  RA Volume:   20.30 ml  11.89 ml/m LA Vol (A4C):   41.4 ml 24.26 ml/m LA Biplane Vol: 50.6 ml 29.65 ml/m AORTIC VALVE AV Area (Vmax):    2.60 cm AV Area (Vmean):   2.46 cm AV Area (VTI):     2.58 cm  AV Vmax:           119.00 cm/s AV Vmean:           83.400 cm/s AV VTI:            0.242 m AV Peak Grad:      5.7 mmHg AV Mean Grad:      3.0 mmHg LVOT Vmax:         85.20 cm/s LVOT Vmean:        56.600 cm/s LVOT VTI:          0.172 m LVOT/AV VTI ratio: 0.71  AORTA Ao Root diam: 3.10 cm  MITRAL VALVE MV Area (PHT): 5.54 cm     SHUNTS MV Decel Time: 137 msec     Systemic VTI:  0.17 m MR Peak grad: 50.6 mmHg     Systemic Diam: 2.15 cm MR Vmax:      355.50 cm/s MV E velocity: 71.80 cm/s MV A velocity: 141.00 cm/s MV E/A ratio:  0.51  Olga Millers MD Electronically signed by Olga Millers MD Signature Date/Time: 05/27/2022/2:03:53 PM    Final    MONITORS  LONG TERM MONITOR (3-14 DAYS) 05/25/2022  Narrative   224 pauses, longest lasting 6.8 seconds   12% Afib burden with average rate 105 bpm   Patch Wear Time:  13 days and 21 hours (2023-10-13T10:48:39-0400 to 2023-10-27T08:32:02-399)  Patient had a min HR of 35 bpm, max HR of 149 bpm, and avg HR of 74 bpm. Predominant underlying rhythm was Sinus Rhythm. Intermittent Bundle Branch Block was present. Atrial Fibrillation/Flutter occurred (12% burden), ranging from 57-149 bpm (avg of 105 bpm), the longest lasting 1 hour 6 mins with an avg rate of 111 bpm. 224 Pauses occurred, the longest lasting 6.8 secs (9 bpm). Atrial Fibrillation/Flutter and Pause were detected within +/- 45 seconds of symptomatic patient event(s). Isolated SVEs were rare (<1.0%), SVE Couplets were rare (<1.0%), and SVE Triplets were rare (<1.0%). Isolated VEs were rare (<1.0%), and no VE Couplets or VE Triplets were present. Inverted QRS complexes possibly due to inverted placement of device. MD notification criteria for Pauses met - report posted prior to notification per account request (MS).   CT SCANS  CT CARDIAC SCORING (SELF PAY ONLY) 04/07/2021  Addendum 04/07/2021  2:43 PM ADDENDUM REPORT: 04/07/2021 14:41  CLINICAL DATA:  Cardiovascular Disease Risk stratification  EXAM: Coronary Calcium  Score  TECHNIQUE: A gated, non-contrast computed tomography scan of the heart was performed using 3mm slice thickness. Axial images were analyzed on a dedicated workstation. Calcium scoring of the coronary arteries was performed using the Agatston method.  FINDINGS: Coronary arteries: Normal origins.  Coronary Calcium Score:  Left main: 80  Left anterior descending artery: 175  Left circumflex artery: 83  Right coronary artery: 266  Total: 604  Percentile: 81st  Pericardium: Normal.  Ascending Aorta: Normal caliber.  Non-cardiac: See separate report from [facility].  IMPRESSION: Coronary calcium score of 604 Agatston units. This was 81st percentile for age-, race-, and sex-matched controls.  RECOMMENDATIONS: Coronary artery calcium (CAC) score is a strong predictor of incident coronary heart disease (CHD) and provides predictive information beyond traditional risk factors. CAC scoring is reasonable to use in the decision to withhold, postpone, or initiate statin therapy in intermediate-risk or selected borderline-risk asymptomatic adults (age 11-75 years and LDL-C >=70 to <190 mg/dL) who do not have diabetes or established atherosclerotic cardiovascular disease (ASCVD).* In intermediate-risk (10-year ASCVD risk >=7.5% to <20%) adults or selected borderline-risk (  10-year ASCVD risk >=5% to <7.5%) adults in whom a CAC score is measured for the purpose of making a treatment decision the following recommendations have been made:  If CAC=0, it is reasonable to withhold statin therapy and reassess in 5 to 10 years, as long as higher risk conditions are absent (diabetes mellitus, family history of premature CHD in first degree relatives (males <55 years; females <65 years), cigarette smoking, or LDL >=190 mg/dL).  If CAC is 1 to 99, it is reasonable to initiate statin therapy for patients >=110 years of age.  If CAC is >=100 or >=75th percentile, it is  reasonable to initiate statin therapy at any age.  Cardiology referral should be considered for patients with CAC scores >=400 or >=75th percentile.  *2018 AHA/ACC/AACVPR/AAPA/ABC/ACPM/ADA/AGS/APhA/ASPC/NLA/PCNA Guideline on the Management of Blood Cholesterol: A Report of the American College of Cardiology/American Heart Association Task Force on Clinical Practice Guidelines. J Am Coll Cardiol. 2019;73(24):3168-3209.  Marca Ancona, MD   Electronically Signed By: Marca Ancona M.D. On: 04/07/2021 14:41  Narrative EXAM: OVER-READ INTERPRETATION  CT CHEST  The following report is an over-read performed by radiologist Dr. Irish Lack of [facility] on 04/07/2021. This over-read does not include interpretation of cardiac or coronary anatomy or pathology. The coronary calcium score interpretation by the cardiologist is attached.  COMPARISON:  None.  FINDINGS: Vascular: Scattered calcified plaque in the visualized thoracic aorta without aneurysmal disease.  Mediastinum/Nodes: Visualized mediastinum and hilar regions demonstrate no lymphadenopathy or masses.  Lungs/Pleura: Visualized lungs show no evidence of pulmonary edema, consolidation, pneumothorax, nodule or pleural fluid.  Upper Abdomen: Cyst in the left lobe of the liver measures approximately 2.5 cm in greatest diameter and demonstrates internal density consistent with a simple cyst. Two other subcentimeter probable cysts are also present in the visualized left lobe.  Musculoskeletal: No chest wall mass or suspicious bone lesions identified.  IMPRESSION: 1. Atherosclerosis of the thoracic aorta without visible aneurysmal disease. 2. Benign appearing cysts in the liver.  Electronically Signed: By: Irish Lack M.D. On: 04/07/2021 12:09           Risk Assessment/Calculations:    CHA2DS2-VASc Score = 5   This indicates a 7.2% annual risk of stroke. The patient's score is based  upon: CHF History: 0 HTN History: 1 Diabetes History: 0 Stroke History: 0 Vascular Disease History: 1 Age Score: 2 Gender Score: 1        STOP-Bang Score:  4      Physical Exam:   VS:  BP 128/84 (BP Location: Left Arm, Patient Position: Sitting, Cuff Size: Normal)   Pulse 60   Ht 5\' 1"  (1.549 m)   Wt 151 lb 3.2 oz (68.6 kg)   SpO2 98%   BMI 28.57 kg/m    Wt Readings from Last 3 Encounters:  03/31/23 151 lb 3.2 oz (68.6 kg)  02/16/23 152 lb 3.2 oz (69 kg)  11/22/22 150 lb (68 kg)    GEN: Well nourished, well developed in no acute distress NECK: No JVD; No carotid bruits CARDIAC: RRR, no murmurs, rubs, gallops RESPIRATORY:  Clear to auscultation without rales, wheezing or rhonchi  ABDOMEN: Soft, non-tender, non-distended EXTREMITIES:  No edema; No deformity   ASSESSMENT AND PLAN: .    Dizziness/hypertension/orthostatic hypotension: At last OV on 02/16/2023 she reported intermittent dizziness primarily with position changes and when walking.  She noted blood pressures in the 80s to 90s with associated dizziness. Given symptoms hypotension/orthostatic hypotension was suspected.  Her losartan  was decreased to 12.5 mg daily. Today she reports ongoing dizziness with position changes, deferred orthostatics. She has been monitoring her blood pressure at home she averages 105/65, ranges from 68/52 to 116/73. Her PCP discontinued her losartan last week, she has not yet noticed a significant difference in her dizziness since discontinuation.  Her husband reports that they have been cutting her Lasix in half currently, she is euvolemic on exam. With ongoing dizziness we will reduce her Coreg to 3.125 mg twice daily. She is advised to hold her Lasix for systolic blood pressure less than 100. Encouraged gradual position changes and adequate hydration.  Reduce Coreg to 3.125 mg twice daily and Lasix to 10 mg every Monday, Wednesday and Friday if systolic blood pressure is greater than 100. If  dizziness persist with medication changes could consider decreasing her amiodarone in the future.   CAD: S/p DES to M LAD, DES to RCA on 03/2022. Stable with no anginal symptoms. No indication for ischemic evaluation.  No ASA in setting of Eliquis. Encouraged increased activity as tolerated. Continue Plavix, Jardiance, and Lipitor.Reduce carvedilol and Lasix as noted above.   Productive cough/Allergies: At last OV in 01/2023 she reported a productive cough or having white phlegm upon awakening in the morning, chest Xray was ordered for evaluation. There was some concern for emphysema on chest xray in 01/2023 but noted improved aeration compared to prior chest Xray. Prior chest xrays noted linear atelectasis in the lung bases and left perihilar linear atelectasis or scarring again noted. Today she reports improvement in phlegm production.  She denies any cough or shortness of breath.  She endorses increased sinus congestion and what sounds to be postnasal drip.  Recommended that she check with her local pharmacist to see if there are any allergy medications that she can take given increased sinus congestion.  Chronic systolic heart failure/ICM/productive cough: Most recent echo showed EF 30 to 35%, akinesis of the anteroseptal, apical, distal inferior wall with overall moderate to severe LV dysfunction, G1 DD, trivial MR.  At last office visit in July she reported recent increase in phlegm with white thick mucus.  Chest x-ray showed emphysema with no acute pulmonary disease.  Today she reports improvement in phlegm, she denies cough.  She appears euvolemic and well compensated today on exam.  With increased dizziness and hypotension GDMT now consists of Coreg 3.125 mg daily and furosemide 10 mg on Monday, Wednesday and Friday.  Will repeat echocardiogram for monitoring.  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  Most recent device interrogation showed no clinically significant  events, lead parameters and battery were within normal limits. Denies palpitations or chest discomfort. Noted to have progressive vision loss in the setting of macular degeneration, she regularly follows with an eye doctor. Chest x-ray in July as noted in #3. Continue Eliquis and amiodarone. Reduce Coreg as noted above.   Hyperlipidemia: LDL was 60 on 02/22/23.  Continue Lipitor.   Right radial artery pseudoaneurysm: S/p surgical repair.  She reports no concerns with the area.  Right radial pulse 2+.        Dispo: Follow up with Bernadene Person, NP in 2 months or sooner if needed.  Signed, Rip Harbour, NP

## 2023-03-31 ENCOUNTER — Ambulatory Visit: Payer: Medicare PPO | Attending: Nurse Practitioner | Admitting: Cardiology

## 2023-03-31 ENCOUNTER — Encounter: Payer: Self-pay | Admitting: Cardiology

## 2023-03-31 VITALS — BP 128/84 | HR 60 | Ht 61.0 in | Wt 151.2 lb

## 2023-03-31 DIAGNOSIS — E785 Hyperlipidemia, unspecified: Secondary | ICD-10-CM | POA: Diagnosis not present

## 2023-03-31 DIAGNOSIS — I480 Paroxysmal atrial fibrillation: Secondary | ICD-10-CM

## 2023-03-31 DIAGNOSIS — I495 Sick sinus syndrome: Secondary | ICD-10-CM | POA: Diagnosis not present

## 2023-03-31 DIAGNOSIS — T81718A Complication of other artery following a procedure, not elsewhere classified, initial encounter: Secondary | ICD-10-CM

## 2023-03-31 DIAGNOSIS — I2510 Atherosclerotic heart disease of native coronary artery without angina pectoris: Secondary | ICD-10-CM | POA: Diagnosis not present

## 2023-03-31 DIAGNOSIS — I255 Ischemic cardiomyopathy: Secondary | ICD-10-CM

## 2023-03-31 DIAGNOSIS — I10 Essential (primary) hypertension: Secondary | ICD-10-CM

## 2023-03-31 DIAGNOSIS — R42 Dizziness and giddiness: Secondary | ICD-10-CM | POA: Diagnosis not present

## 2023-03-31 DIAGNOSIS — I5022 Chronic systolic (congestive) heart failure: Secondary | ICD-10-CM

## 2023-03-31 DIAGNOSIS — T81718D Complication of other artery following a procedure, not elsewhere classified, subsequent encounter: Secondary | ICD-10-CM | POA: Diagnosis not present

## 2023-03-31 DIAGNOSIS — I729 Aneurysm of unspecified site: Secondary | ICD-10-CM

## 2023-03-31 DIAGNOSIS — I5020 Unspecified systolic (congestive) heart failure: Secondary | ICD-10-CM

## 2023-03-31 MED ORDER — CARVEDILOL 3.125 MG PO TABS
3.1250 mg | ORAL_TABLET | Freq: Two times a day (BID) | ORAL | 3 refills | Status: DC
Start: 2023-03-31 — End: 2024-03-26

## 2023-03-31 NOTE — Patient Instructions (Signed)
Medication Instructions:  Decrease Lasix to 10 mg Daily ( Take 1/2 Tablet 10 mg Daily). Decrease Coreg to 3.125 mg ( Take 1 Tablet Twice Daily). *If you need a refill on your cardiac medications before your next appointment, please call your pharmacy*   Lab Work: No Labs If you have labs (blood work) drawn today and your tests are completely normal, you will receive your results only by: MyChart Message (if you have MyChart) OR A paper copy in the mail If you have any lab test that is abnormal or we need to change your treatment, we will call you to review the results.   Testing/Procedures: [employer]. Your physician has requested that you have an echocardiogram. Echocardiography is a painless test that uses sound waves to create images of your heart. It provides your doctor with information about the size and shape of your heart and how well your heart's chambers and valves are working. This procedure takes approximately one hour. There are no restrictions for this procedure. Please do NOT wear cologne, perfume, aftershave, or lotions (deodorant is allowed). Please arrive 15 minutes prior to your appointment time.    Follow-Up: At [facility], you and your health needs are our priority.  As part of our continuing mission to provide you with exceptional heart care, we have created designated Provider Care Teams.  These Care Teams include your primary Cardiologist (physician) and Advanced Practice Providers (APPs -  Physician Assistants and Nurse Practitioners) who all work together to provide you with the care you need, when you need it.  We recommend signing up for the patient portal called "MyChart".  Sign up information is provided on this After Visit Summary.  MyChart is used to connect with patients for Virtual Visits (Telemedicine).  Patients are able to view lab/test results, encounter notes, upcoming appointments, etc.  Non-urgent messages can be sent to  your provider as well.   To learn more about what you can do with MyChart, go to [email or web address].    Your next appointment:   2 month(s)  Provider:   Bernadene Person, NP

## 2023-04-26 ENCOUNTER — Ambulatory Visit (HOSPITAL_BASED_OUTPATIENT_CLINIC_OR_DEPARTMENT_OTHER): Payer: Medicare PPO

## 2023-04-26 DIAGNOSIS — I495 Sick sinus syndrome: Secondary | ICD-10-CM | POA: Diagnosis not present

## 2023-04-26 DIAGNOSIS — I729 Aneurysm of unspecified site: Secondary | ICD-10-CM

## 2023-04-26 DIAGNOSIS — I480 Paroxysmal atrial fibrillation: Secondary | ICD-10-CM | POA: Diagnosis not present

## 2023-04-26 DIAGNOSIS — I5022 Chronic systolic (congestive) heart failure: Secondary | ICD-10-CM | POA: Diagnosis not present

## 2023-04-26 DIAGNOSIS — I10 Essential (primary) hypertension: Secondary | ICD-10-CM

## 2023-04-26 DIAGNOSIS — I2510 Atherosclerotic heart disease of native coronary artery without angina pectoris: Secondary | ICD-10-CM

## 2023-04-26 DIAGNOSIS — T81718D Complication of other artery following a procedure, not elsewhere classified, subsequent encounter: Secondary | ICD-10-CM | POA: Diagnosis not present

## 2023-04-26 DIAGNOSIS — I255 Ischemic cardiomyopathy: Secondary | ICD-10-CM

## 2023-04-26 DIAGNOSIS — E785 Hyperlipidemia, unspecified: Secondary | ICD-10-CM

## 2023-04-26 LAB — ECHOCARDIOGRAM COMPLETE
AR max vel: 2.01 cm2
AV Area VTI: 2 cm2
AV Area mean vel: 1.97 cm2
AV Mean grad: 2 mm[Hg]
AV Peak grad: 4.5 mm[Hg]
Ao pk vel: 1.06 m/s
Area-P 1/2: 1.71 cm2
S' Lateral: 3.56 cm
Single Plane A4C EF: 44 %

## 2023-04-26 MED ORDER — PERFLUTREN LIPID MICROSPHERE
1.0000 mL | INTRAVENOUS | Status: AC | PRN
Start: 2023-04-26 — End: 2023-04-26
  Administered 2023-04-26: 3 mL via INTRAVENOUS

## 2023-05-09 ENCOUNTER — Other Ambulatory Visit: Payer: Self-pay | Admitting: Cardiology

## 2023-05-09 NOTE — Telephone Encounter (Signed)
Prescription refill request for Eliquis received. Indication:afib Last office visit:7/24 Scr:1.31  4/24 Age: 82 Weight:68.6  kg  Prescription refilled

## 2023-05-16 ENCOUNTER — Other Ambulatory Visit (HOSPITAL_BASED_OUTPATIENT_CLINIC_OR_DEPARTMENT_OTHER): Payer: Self-pay

## 2023-05-16 MED ORDER — COVID-19 MRNA VAC-TRIS(PFIZER) 30 MCG/0.3ML IM SUSY
0.3000 mL | PREFILLED_SYRINGE | Freq: Once | INTRAMUSCULAR | 0 refills | Status: AC
  Filled 2023-05-16: qty 0.3, 1d supply, fill #0

## 2023-05-25 DIAGNOSIS — H43812 Vitreous degeneration, left eye: Secondary | ICD-10-CM | POA: Diagnosis not present

## 2023-05-25 DIAGNOSIS — H35032 Hypertensive retinopathy, left eye: Secondary | ICD-10-CM | POA: Diagnosis not present

## 2023-05-25 DIAGNOSIS — H20042 Secondary noninfectious iridocyclitis, left eye: Secondary | ICD-10-CM | POA: Diagnosis not present

## 2023-05-25 DIAGNOSIS — H353134 Nonexudative age-related macular degeneration, bilateral, advanced atrophic with subfoveal involvement: Secondary | ICD-10-CM | POA: Diagnosis not present

## 2023-06-01 ENCOUNTER — Ambulatory Visit: Payer: Medicare PPO | Attending: Nurse Practitioner | Admitting: Nurse Practitioner

## 2023-06-01 ENCOUNTER — Encounter: Payer: Self-pay | Admitting: Nurse Practitioner

## 2023-06-01 VITALS — BP 122/70 | HR 61 | Ht 61.0 in | Wt 148.8 lb

## 2023-06-01 DIAGNOSIS — I495 Sick sinus syndrome: Secondary | ICD-10-CM | POA: Diagnosis not present

## 2023-06-01 DIAGNOSIS — T81718D Complication of other artery following a procedure, not elsewhere classified, subsequent encounter: Secondary | ICD-10-CM

## 2023-06-01 DIAGNOSIS — I2510 Atherosclerotic heart disease of native coronary artery without angina pectoris: Secondary | ICD-10-CM | POA: Diagnosis not present

## 2023-06-01 DIAGNOSIS — I951 Orthostatic hypotension: Secondary | ICD-10-CM | POA: Diagnosis not present

## 2023-06-01 DIAGNOSIS — I10 Essential (primary) hypertension: Secondary | ICD-10-CM

## 2023-06-01 DIAGNOSIS — E785 Hyperlipidemia, unspecified: Secondary | ICD-10-CM

## 2023-06-01 DIAGNOSIS — I480 Paroxysmal atrial fibrillation: Secondary | ICD-10-CM | POA: Diagnosis not present

## 2023-06-01 DIAGNOSIS — R42 Dizziness and giddiness: Secondary | ICD-10-CM | POA: Diagnosis not present

## 2023-06-01 DIAGNOSIS — I729 Aneurysm of unspecified site: Secondary | ICD-10-CM

## 2023-06-01 DIAGNOSIS — I5022 Chronic systolic (congestive) heart failure: Secondary | ICD-10-CM | POA: Diagnosis not present

## 2023-06-01 NOTE — Patient Instructions (Signed)
Medication Instructions:  Furosemide (Lasix) 20 mg daily as needed for swelling or weight gain.   *If you need a refill on your cardiac medications before your next appointment, please call your pharmacy*   Lab Work: NONE ordered at this time of appointment     Testing/Procedures: NONE ordered at this time of appointment     Follow-Up: At [facility], you and your health needs are our priority.  As part of our continuing mission to provide you with exceptional heart care, we have created designated Provider Care Teams.  These Care Teams include your primary Cardiologist (physician) and Advanced Practice Providers (APPs -  Physician Assistants and Nurse Practitioners) who all work together to provide you with the care you need, when you need it.  We recommend signing up for the patient portal called "MyChart".  Sign up information is provided on this After Visit Summary.  MyChart is used to connect with patients for Virtual Visits (Telemedicine).  Patients are able to view lab/test results, encounter notes, upcoming appointments, etc.  Non-urgent messages can be sent to your provider as well.   To learn more about what you can do with MyChart, go to [email or web address].    Your next appointment:    Next available   Provider:   Little Ishikawa, MD

## 2023-06-01 NOTE — Progress Notes (Signed)
Office Visit    Patient Name: Connie [name] Date of Encounter: 06/01/2023  Primary Care Provider:  Shon Hale, MD Primary Cardiologist:  Little Ishikawa, MD  Chief Complaint    82 year old female with a history of CAD, chronic systolic heart failure, ICM, paroxysmal atrial flutter,  tachycardia-bradycardia syndrome s/p PPM, right radial artery pseudoaneurysm, hypertension, hyperlipidemia, hypothyroidism, GERD, and psoriatic arthritis who presents for follow-up related to CAD, heart failure and atrial fibrillation.   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   Multiple, noted on [place]   History of hiatal hernia    Hypertension    Hypothyroidism    Macular degeneration    Psoriatic arthritis (HCC)    Sinus drainage    UTI (urinary tract infection)    Past Surgical History:  Procedure Laterality Date   BACK SURGERY  2000   Dr Newell Coral   bladder tack  2005   COLONOSCOPY     CORONARY ANGIOPLASTY     CORONARY STENT INTERVENTION N/A 04/11/2022   Procedure: CORONARY STENT INTERVENTION;  Surgeon: Tonny Bollman, MD;  Location: [facility] INVASIVE CV LAB;  Service: Cardiovascular;  Laterality: N/A;   DILATION AND CURETTAGE OF UTERUS     after miscarriage   FALSE ANEURYSM REPAIR Right 11/09/2022   Procedure: 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;  Surgeon: Leonie Douglas, MD;  Location: [facility] OR;  Service: Vascular;  Laterality: Right;   INSERT / REPLACE / REMOVE PACEMAKER     LEFT HEART CATH AND CORONARY ANGIOGRAPHY N/A 04/07/2022   Procedure: LEFT HEART CATH AND CORONARY ANGIOGRAPHY;  Surgeon: Tonny Bollman, MD;  Location: [facility] INVASIVE CV LAB;  Service: Cardiovascular;  Laterality: N/A;   PACEMAKER IMPLANT N/A 05/27/2022   Procedure: PACEMAKER IMPLANT;  Surgeon: Maurice Small, MD;   Location: [facility] INVASIVE CV LAB;  Service: Cardiovascular;  Laterality: N/A;   RECTAL PROLAPSE REPAIR  2008   SCAR REVISION Right 2017   Knee, Dr. Luiz Blare   TONSILLECTOMY     as a child   TOTAL KNEE ARTHROPLASTY Right 09/05/2014   Procedure: TOTAL KNEE ARTHROPLASTY;  Surgeon: Harvie Junior, MD;  Location: [facility] OR;  Service: Orthopedics;  Laterality: Right;   TOTAL KNEE ARTHROPLASTY Left 01/10/2022   Procedure: TOTAL KNEE ARTHROPLASTY;  Surgeon: Jodi Geralds, MD;  Location: [facility] ORS;  Service: Orthopedics;  Laterality: Left;   TOTAL SHOULDER ARTHROPLASTY Left 11/17/2017   Procedure: LEFT TOTAL SHOULDER ARTHROPLASTY;  Surgeon: Jodi Geralds, MD;  Location: [facility] ORS;  Service: Orthopedics;  Laterality: Left;  with block   TUBAL LIGATION  1972    Allergies  Allergies  Allergen Reactions   Penicillins Rash and Other (See Comments)    Has patient had a PCN reaction causing immediate rash, facial/tongue/throat swelling, SOB or lightheadedness with hypotension: Yes Has patient had a PCN reaction causing severe rash involving mucus membranes or skin necrosis: No Has patient had a PCN reaction that required hospitalization: No Has patient had a PCN reaction occurring within the last 10 years: No If all of the above answers are "NO", then may proceed with Cephalosporin use.    Sulfa Antibiotics Rash     Labs/Other Studies Reviewed    The following studies were reviewed today:  Cardiac Studies & Procedures   CARDIAC CATHETERIZATION  CARDIAC CATHETERIZATION 04/11/2022  Narrative   Mid RCA lesion is 90% stenosed.   Post intervention, there is a 0% residual stenosis.  1.  Successful PTCA and stenting of severe tandem lesions in the RCA using a 3.5 x 38 mm drug-eluting stent 2.  Continued patency of the LAD stent with TIMI-3 flow into the apical LAD  Recommendations: DAPT with clopidogrel x12 months.  Patient has been diagnosed with paroxysmal atrial fibrillation.  We will start apixaban at 2200  today.  Continue aspirin 81 mg x 30 days.  Findings Coronary Findings Diagnostic  Dominance: Right  Left Main The vessel exhibits minimal luminal irregularities.  Left Anterior Descending Non-stenotic Prox LAD to Mid LAD lesion was previously treated. The lesion is heavily thrombotic.  Left Circumflex There is mild diffuse disease throughout the vessel.  First Obtuse Marginal Branch There is mild disease in the vessel.  Right Coronary Artery Prox RCA lesion is 60% stenosed. Mid RCA lesion is 90% stenosed.  Intervention  Prox RCA lesion Stent (Also treats lesions: Mid RCA) CATH VISTA GUIDE 6FR JR4 guide catheter was inserted. Lesion crossed with guidewire using a WIRE COUGAR XT STRL 190CM. Pre-stent angioplasty was performed using a BALLN SAPPHIRE 2.5X15. A drug-eluting stent was successfully placed using a SYNERGY XD 3.50X38. Maximum pressure: 16 atm. Post-stent angioplasty was performed using a BALLN [place] EUPHORA RX [id number]. Maximum pressure:  20 atm. Post-Intervention Lesion Assessment The intervention was successful. Pre-interventional TIMI flow is 3. Post-intervention TIMI flow is 3. No complications occurred at this lesion. There is a 0% residual stenosis post intervention.  Mid RCA lesion Stent (Also treats lesions: Prox RCA) See details in Prox RCA lesion. Post-Intervention Lesion Assessment The intervention was successful. Pre-interventional TIMI flow is 3. Post-intervention TIMI flow is 3. No complications occurred at this lesion. There is a 0% residual stenosis post intervention.   CARDIAC CATHETERIZATION 04/07/2022  Narrative   Prox LAD to Mid LAD lesion is 100% stenosed.   Prox RCA lesion is 60% stenosed.   Mid RCA lesion is 90% stenosed.   A drug-eluting stent was successfully placed using a SYNERGY XD 2.75X20.   Post intervention, there is a 0% residual stenosis.   LV end diastolic pressure is severely elevated.  1.  Acute anterior STEMI secondary to  thrombotic occlusion of the mid LAD just after the first diagonal, treated with PCI using a 2.75 x 20 mm Synergy DES.  Procedure complicated by no reflow phenomenon likely due to late STEMI presentation with TIMI II flow at the apical portion of the LAD at the completion of the procedure 2.  Severe mid RCA stenosis, recommend staged PCI once she recovers from her anterior infarct 3.  Patent left main and left circumflex with mild nonobstructive plaquing 4.  Severely elevated LVEDP of 28 mmHg consistent with acute systolic heart failure secondary to acute myocardial infarction  Recommendations: ICU care, post MI medical therapy, patient loaded with ticagrelor 180 mg at the completion of the procedure, continue cangrelor x2 hours, consider staged PCI of the RCA prior to discharge pending her clinical course.  Will check 2D echo to assess degree of LV dysfunction.  Findings Coronary Findings Diagnostic  Dominance: Right  Left Main The vessel exhibits minimal luminal irregularities.  Left Anterior Descending Prox LAD to Mid LAD lesion is 100% stenosed. The lesion is heavily thrombotic. The lesion was not previously treated .  Left Circumflex There is mild diffuse disease throughout the vessel.  First Obtuse Marginal Branch There is  mild disease in the vessel.  Right Coronary Artery Prox RCA lesion is 60% stenosed. Mid RCA lesion is 90% stenosed.  Intervention  Prox LAD to Mid LAD lesion Stent A drug-eluting stent was successfully placed using a SYNERGY XD 2.75X20. Post-Intervention Lesion Assessment The intervention was successful. Pre-interventional TIMI flow is 0. Post-intervention TIMI flow is 2. No-reflow occurred during the intervention. IC verapamil was given. There is a 0% residual stenosis post intervention.   STRESS TESTS  MYOCARDIAL PERFUSION IMAGING 08/10/2021  Narrative   The study is normal. The study is low risk.   No ST deviation was noted.   LV perfusion is  normal. There is no evidence of ischemia. There is no evidence of infarction.   Left ventricular function is normal. Nuclear stress EF: 79 %. The left ventricular ejection fraction is hyperdynamic (>65%). End diastolic cavity size is normal. End systolic cavity size is normal.   ECHOCARDIOGRAM  ECHOCARDIOGRAM COMPLETE 04/26/2023  Narrative ECHOCARDIOGRAM REPORT    Patient Name:   Connie [name] Date of Exam: 04/26/2023 Medical Rec #:  [id number]          Height:       61.0 in Accession #:    [id number]         Weight:       151.2 lb Date of Birth:  1940/10/31          BSA:          1.677 m Patient Age:    82 years           BP:           128/84 mmHg Patient Gender: F                  HR:           73 bpm. Exam Location:  Outpatient  Procedure: 2D Echo, Cardiac Doppler, Color Doppler and Intracardiac Opacification Agent  Indications:    R06.9 DOE  History:        Patient has prior history of Echocardiogram examinations, most recent 05/27/2022. CHF and Cardiomyopathy, Previous Myocardial Infarction and CAD, Pacemaker, Arrythmias:Atrial Fibrillation; Risk Factors:Hypertension, Dyslipidemia and Non-Smoker. Patient denies chest pain and leg edema. She does have DOE.  Sonographer:    Carlos American RVT, RDCS (AE), RDMS Referring Phys: [id number] KATLYN D WEST  IMPRESSIONS   1. Left ventricular ejection fraction, by estimation, is 30 to 35%. The left ventricle has moderately decreased function. The left ventricle demonstrates regional wall motion abnormalities (see scoring diagram/findings for description). Left ventricular diastolic parameters are consistent with Grade I diastolic dysfunction (impaired relaxation). 2. Right ventricular systolic function is normal. The right ventricular size is normal. There is normal pulmonary artery systolic pressure. The estimated right ventricular systolic pressure is 27.2 mmHg. 3. The mitral valve is degenerative. Mild mitral valve regurgitation. No  evidence of mitral stenosis. 4. The aortic valve is tricuspid. There is mild thickening of the aortic valve. Aortic valve regurgitation is not visualized. No aortic stenosis is present. 5. The inferior vena cava is normal in size with greater than 50% respiratory variability, suggesting right atrial pressure of 3 mmHg.  Comparison(s): A prior study was performed on 05/27/22. No significant change from prior study. Prior images reviewed side by side.  Conclusion(s)/Recommendation(s): No left ventricular mural or apical thrombus/thrombi.  FINDINGS Left Ventricle: Left ventricular ejection fraction, by estimation, is 30 to 35%. The left ventricle has moderately decreased function. The left ventricle demonstrates regional wall motion  abnormalities. Definity contrast agent was given IV to delineate the left ventricular endocardial borders. 3D ejection fraction reviewed and evaluated as part of the interpretation. Alternate measurement of EF is felt to be most reflective of LV function. The left ventricular internal cavity size was normal in size. There is no left ventricular hypertrophy. Left ventricular diastolic parameters are consistent with Grade I diastolic dysfunction (impaired relaxation).   LV Wall Scoring: The mid and distal anterior septum, mid and distal inferior wall, and mid inferoseptal segment are akinetic. The basal anteroseptal segment, apical lateral segment, apical anterior segment, and basal inferoseptal segment are hypokinetic. The mid inferolateral segment is normal.  Right Ventricle: The right ventricular size is normal. No increase in right ventricular wall thickness. Right ventricular systolic function is normal. There is normal pulmonary artery systolic pressure. The tricuspid regurgitant velocity is 2.46 m/s, and with an assumed right atrial pressure of 3 mmHg, the estimated right ventricular systolic pressure is 27.2 mmHg.  Left Atrium: Left atrial size was normal in  size.  Right Atrium: Right atrial size was normal in size.  Pericardium: There is no evidence of pericardial effusion.  Mitral Valve: The mitral valve is degenerative in appearance. Mild mitral valve regurgitation. No evidence of mitral valve stenosis.  Tricuspid Valve: The tricuspid valve is normal in structure. Tricuspid valve regurgitation is trivial. No evidence of tricuspid stenosis.  Aortic Valve: The aortic valve is tricuspid. There is mild thickening of the aortic valve. Aortic valve regurgitation is not visualized. No aortic stenosis is present. Aortic valve mean gradient measures 2.0 mmHg. Aortic valve peak gradient measures 4.5 mmHg. Aortic valve area, by VTI measures 2.00 cm.  Pulmonic Valve: The pulmonic valve was grossly normal. Pulmonic valve regurgitation is trivial. No evidence of pulmonic stenosis.  Aorta: The aortic root is normal in size and structure.  Venous: The inferior vena cava is normal in size with greater than 50% respiratory variability, suggesting right atrial pressure of 3 mmHg.  IAS/Shunts: No atrial level shunt detected by color flow Doppler.   LEFT VENTRICLE PLAX 2D LVIDd:         4.87 cm     Diastology LVIDs:         3.56 cm     LV e' medial:    4.22 cm/s LV PW:         0.80 cm     LV E/e' medial:  13.2 LV IVS:        0.72 cm     LV e' lateral:   3.32 cm/s LVOT diam:     1.90 cm     LV E/e' lateral: 16.8 LV SV:         52 LV SV Index:   31 LVOT Area:     2.84 cm  3D Volume EF: LV Volumes (MOD)           LV EDV:       136 ml LV vol d, MOD A4C: 82.8 ml LV ESV:       62 ml LV vol s, MOD A4C: 46.4 ml LV SV:        73 ml LV SV MOD A4C:     82.8 ml  RIGHT VENTRICLE RV S prime:     9.62 cm/s TAPSE (M-mode): 1.6 cm  LEFT ATRIUM             Index        RIGHT ATRIUM  Index LA diam:        3.90 cm 2.33 cm/m   RA Area:     14.80 cm LA Vol (A2C):   54.2 ml 32.32 ml/m  RA Volume:   41.80 ml  24.92 ml/m LA Vol (A4C):   41.1 ml 24.51  ml/m LA Biplane Vol: 48.8 ml 29.10 ml/m AORTIC VALVE                    PULMONIC VALVE AV Area (Vmax):    2.01 cm     PV Vmax:          0.77 m/s AV Area (Vmean):   1.97 cm     PV Peak grad:     2.3 mmHg AV Area (VTI):     2.00 cm     PR End Diast Vel: 7.62 msec AV Vmax:           106.00 cm/s AV Vmean:          73.000 cm/s AV VTI:            0.262 m AV Peak Grad:      4.5 mmHg AV Mean Grad:      2.0 mmHg LVOT Vmax:         75.00 cm/s LVOT Vmean:        50.800 cm/s LVOT VTI:          0.185 m LVOT/AV VTI ratio: 0.71  AORTA Ao Root diam: 3.10 cm Ao Arch diam: 2.7 cm  MITRAL VALVE                TRICUSPID VALVE MV Area (PHT): 1.71 cm     TR Peak grad:   24.2 mmHg MV Decel Time: 443 msec     TR Vmax:        246.00 cm/s MV E velocity: 55.80 cm/s MV A velocity: 112.00 cm/s  SHUNTS MV E/A ratio:  0.50         Systemic VTI:  0.18 m Systemic Diam: 1.90 cm  Weston Brass MD Electronically signed by Weston Brass MD Signature Date/Time: 04/26/2023/8:38:23 PM    Final    MONITORS  LONG TERM MONITOR (3-14 DAYS) 05/25/2022  Narrative   224 pauses, longest lasting 6.8 seconds   12% Afib burden with average rate 105 bpm   Patch Wear Time:  13 days and 21 hours (2023-10-13T10:48:39-0400 to 2023-10-27T08:32:02-399)  Patient had a min HR of 35 bpm, max HR of 149 bpm, and avg HR of 74 bpm. Predominant underlying rhythm was Sinus Rhythm. Intermittent Bundle Branch Block was present. Atrial Fibrillation/Flutter occurred (12% burden), ranging from 57-149 bpm (avg of 105 bpm), the longest lasting 1 hour 6 mins with an avg rate of 111 bpm. 224 Pauses occurred, the longest lasting 6.8 secs (9 bpm). Atrial Fibrillation/Flutter and Pause were detected within +/- 45 seconds of symptomatic patient event(s). Isolated SVEs were rare (<1.0%), SVE Couplets were rare (<1.0%), and SVE Triplets were rare (<1.0%). Isolated VEs were rare (<1.0%), and no VE Couplets or VE Triplets were present.  Inverted QRS complexes possibly due to inverted placement of device. MD notification criteria for Pauses met - report posted prior to notification per account request (MS).   CT SCANS  CT CARDIAC SCORING (SELF PAY ONLY) 04/07/2021  Addendum 04/07/2021  2:43 PM ADDENDUM REPORT: 04/07/2021 14:41  CLINICAL DATA:  Cardiovascular Disease Risk stratification  EXAM: Coronary Calcium Score  TECHNIQUE: A gated, non-contrast computed tomography scan of the heart  was performed using 3mm slice thickness. Axial images were analyzed on a dedicated workstation. Calcium scoring of the coronary arteries was performed using the Agatston method.  FINDINGS: Coronary arteries: Normal origins.  Coronary Calcium Score:  Left main: 80  Left anterior descending artery: 175  Left circumflex artery: 83  Right coronary artery: 266  Total: 604  Percentile: 81st  Pericardium: Normal.  Ascending Aorta: Normal caliber.  Non-cardiac: See separate report from [facility].  IMPRESSION: Coronary calcium score of 604 Agatston units. This was 81st percentile for age-, race-, and sex-matched controls.  RECOMMENDATIONS: Coronary artery calcium (CAC) score is a strong predictor of incident coronary heart disease (CHD) and provides predictive information beyond traditional risk factors. CAC scoring is reasonable to use in the decision to withhold, postpone, or initiate statin therapy in intermediate-risk or selected borderline-risk asymptomatic adults (age 30-75 years and LDL-C >=70 to <190 mg/dL) who do not have diabetes or established atherosclerotic cardiovascular disease (ASCVD).* In intermediate-risk (10-year ASCVD risk >=7.5% to <20%) adults or selected borderline-risk (10-year ASCVD risk >=5% to <7.5%) adults in whom a CAC score is measured for the purpose of making a treatment decision the following recommendations have been made:  If CAC=0, it is reasonable to withhold statin  therapy and reassess in 5 to 10 years, as long as higher risk conditions are absent (diabetes mellitus, family history of premature CHD in first degree relatives (males <55 years; females <65 years), cigarette smoking, or LDL >=190 mg/dL).  If CAC is 1 to 99, it is reasonable to initiate statin therapy for patients >=6 years of age.  If CAC is >=100 or >=75th percentile, it is reasonable to initiate statin therapy at any age.  Cardiology referral should be considered for patients with CAC scores >=400 or >=75th percentile.  *2018 AHA/ACC/AACVPR/AAPA/ABC/ACPM/ADA/AGS/APhA/ASPC/NLA/PCNA Guideline on the Management of Blood Cholesterol: A Report of the American College of Cardiology/American Heart Association Task Force on Clinical Practice Guidelines. J Am Coll Cardiol. 2019;73(24):3168-3209.  Marca Ancona, MD   Electronically Signed By: Marca Ancona M.D. On: 04/07/2021 14:41  Narrative EXAM: OVER-READ INTERPRETATION  CT CHEST  The following report is an over-read performed by radiologist Dr. Irish Lack of [facility] on 04/07/2021. This over-read does not include interpretation of cardiac or coronary anatomy or pathology. The coronary calcium score interpretation by the cardiologist is attached.  COMPARISON:  None.  FINDINGS: Vascular: Scattered calcified plaque in the visualized thoracic aorta without aneurysmal disease.  Mediastinum/Nodes: Visualized mediastinum and hilar regions demonstrate no lymphadenopathy or masses.  Lungs/Pleura: Visualized lungs show no evidence of pulmonary edema, consolidation, pneumothorax, nodule or pleural fluid.  Upper Abdomen: Cyst in the left lobe of the liver measures approximately 2.5 cm in greatest diameter and demonstrates internal density consistent with a simple cyst. Two other subcentimeter probable cysts are also present in the visualized left lobe.  Musculoskeletal: No chest wall mass or suspicious  bone lesions identified.  IMPRESSION: 1. Atherosclerosis of the thoracic aorta without visible aneurysmal disease. 2. Benign appearing cysts in the liver.  Electronically Signed: By: Irish Lack M.D. On: 04/07/2021 12:09         Recent Labs: 11/04/2022: ALT 47; BUN 23; Creatinine, Ser 1.31; Hemoglobin 13.3; Platelets 296; Potassium 4.9; Sodium 135  Recent Lipid Panel    Component Value Date/Time   CHOL 108 05/06/2022 0959   TRIG 91 05/06/2022 0959   HDL 44 05/06/2022 0959   CHOLHDL 2.5 05/06/2022 0959   CHOLHDL 2.5 04/07/2022 1044  VLDL 21 04/07/2022 1044   LDLCALC 46 05/06/2022 0959    History of Present Illness    82 year old female with the above past medical history including CAD, chronic systolic heart failure, ICM, paroxysmal atrial flutter,  tachycardia-bradycardia syndrome s/p PPM, right radial artery pseudoaneurysm, hypertension, hyperlipidemia, hypothyroidism, GERD, and psoriatic arthritis.   She was referred to Dr. Bjorn Pippin in August 2022 in the setting of palpitations. 7-day Zio patch in 03/2021 revealed atrial flutter 2% burden), echocardiogram in 03/2021 showed normal biventricular function, no significant valvular disease. Calcium score in 03/2021 was 604 (81st percentile). Lexiscan Myoview in 07/2021 showed no evidence of ischemia, EF 79%.  She was hospitalized in 03/2022 in the setting of anterior STEMI. LHC showed thrombotic occlusion to mLAD s/p DES. She was noted to have severe mRCA disease and underwent staged PCI/DES-RCA on 04/11/2022.  Echocardiogram at the time showed EF 35 to 40%, akinesis of LV, entire anterior wall, anteroseptal wall, apical segment, and a inferior apical segment, no LV apical thrombus, RV mildly reduced.  She was diuresed with IV Lasix and started on GDMT.  Her hospital course was complicated by atrial fibrillation with RVR with postconversion pauses up to 6 to 7 seconds in duration.  She was started on amiodarone and Eliquis.  EP was  consulted. PPM was deferred.  Outpatient cardiac monitor revealed significant pauses, atrial fibrillation/atrial flutter, tachycardia bradycardia syndrome.  She was again referred to EP.  She was ultimately hospitalized in November 2023 in the setting of tachybradycardia syndrome and underwent PPP implantation with Medtronic dual-chamber pacemaker.  Beta-blocker therapy was reinitiated.  Repeat echocardiogram showed EF 30 to 35%, akinesis of the anteroseptal, apical, distal inferior wall with overall moderate to severe LV dysfunction, G1 DD, trivial MR. Her procedure was complicated by a right radial pseudoaneurysm. She underwent right radial pseudoaneurysm repair per vascular surgery in 10/2022.  She was last seen in the office on 03/31/2023 and was doing well from a cardiac standpoint.  She noted ongoing episodes of orthostatic hypotension following discontinuation of losartan.  Carvedilol was reduced to 3.125 mg twice daily.  She was advised to hold her Lasix for SBP less than 100.  Repeat echocardiogram showed EF 30 to 35%, moderately decreased LV function, RWMA, G1 DD, normal RV systolic function, mild mitral valve regurgitation, no significant change from prior study.  She presents today for follow-accompanied by her husband.  Since her last visit she has been stable from a cardiac standpoint.  He is working on gradually increasing her activity.  She denies any symptoms concerning for angina, denies palpitations, dyspnea, edema, PND, orthopnea, weight gain.  She does have ongoing intermittent dizziness in the setting of intermittently low BP, she denies presyncope or syncope. BP overall stable, slightly improved compared to prior visits.  Home Medications    Current Outpatient Medications  Medication Sig Dispense Refill   amiodarone (PACERONE) 200 MG tablet TAKE 1 TABLET(200 MG) BY MOUTH DAILY 90 tablet 3   apixaban (ELIQUIS) 5 MG TABS tablet TAKE 1 TABLET(5 MG) BY MOUTH TWICE DAILY 60 tablet 5    atorvastatin (LIPITOR) 80 MG tablet TAKE 1 TABLET(80 MG) BY MOUTH DAILY 90 tablet 3   buPROPion (WELLBUTRIN XL) 150 MG 24 hr tablet Take 150 mg by mouth daily.     carvedilol (COREG) 3.125 MG tablet Take 1 tablet (3.125 mg total) by mouth 2 (two) times daily. 180 tablet 3   clopidogrel (PLAVIX) 75 MG tablet Take 1 tablet (75 mg total) by mouth daily. 90  tablet 3   empagliflozin (JARDIANCE) 10 MG TABS tablet TAKE 1 TABLET(10 MG) BY MOUTH DAILY 30 tablet 10   furosemide (LASIX) 20 MG tablet TAKE 1 TABLET BY MOUTH EVERY MONDAY, WEDNESDAY AND FRIDAY AS DIRECTED (Patient taking differently: Take 20 mg by mouth daily as needed for fluid or edema.) 15 tablet 11   levothyroxine (SYNTHROID) 100 MCG tablet Take 100 mcg by mouth daily before breakfast.     nitroGLYCERIN (NITROSTAT) 0.4 MG SL tablet Place 1 tablet (0.4 mg total) under the tongue every 5 (five) minutes x 3 doses as needed for chest pain. 25 tablet 3   sertraline (ZOLOFT) 100 MG tablet Take 100 mg by mouth daily.     traMADol (ULTRAM) 50 MG tablet Take 1 tablet (50 mg total) by mouth every 6 (six) hours as needed. 10 tablet 0   levofloxacin (LEVAQUIN) 750 MG tablet Take 750 mg by mouth daily.     No current facility-administered medications for this visit.     Review of Systems    She denies chest pain, palpitations, dyspnea, pnd, orthopnea, n, v, syncope, edema, weight gain, or early satiety. All other systems reviewed and are otherwise negative except as noted above.   Physical Exam    VS:  BP 122/70   Pulse 61   Ht 5\' 1"  (1.549 m)   Wt 148 lb 12.8 oz (67.5 kg)   SpO2 98%   BMI 28.12 kg/m   STOP-Bang Score:         GEN: Well nourished, well developed, in no acute distress. HEENT: normal. Neck: Supple, no JVD, carotid bruits, or masses. Cardiac: RRR, no murmurs, rubs, or gallops. No clubbing, cyanosis, edema.  Radials/DP/PT 2+ and equal bilaterally.  Respiratory:  Respirations regular and unlabored, clear to auscultation  bilaterally. GI: Soft, nontender, nondistended, BS + x 4. MS: no deformity or atrophy. Skin: warm and dry, no rash. Neuro:  Strength and sensation are intact. Psych: Normal affect.  Accessory Clinical Findings    ECG personally reviewed by me today - EKG Interpretation Date/Time:  Thursday June 01 2023 09:55:46 EST Ventricular Rate:  61 PR Interval:  182 QRS Duration:  128 QT Interval:  474 QTC Calculation: 477 R Axis:   -62  Text Interpretation: Atrial-paced rhythm Left axis deviation Right bundle branch block Confirmed by Bernadene Person ([id number]) on 06/01/2023 9:58:38 AM  - no acute changes.   Lab Results  Component Value Date   WBC 9.2 11/04/2022   HGB 13.3 11/04/2022   HCT 41.4 11/04/2022   MCV 84.3 11/04/2022   PLT 296 11/04/2022   Lab Results  Component Value Date   CREATININE 1.31 (H) 11/04/2022   BUN 23 11/04/2022   NA 135 11/04/2022   K 4.9 11/04/2022   CL 103 11/04/2022   CO2 25 11/04/2022   Lab Results  Component Value Date   ALT 47 (H) 11/04/2022   AST 49 (H) 11/04/2022   ALKPHOS 127 (H) 11/04/2022   BILITOT 0.9 11/04/2022   Lab Results  Component Value Date   CHOL 108 05/06/2022   HDL 44 05/06/2022   LDLCALC 46 05/06/2022   TRIG 91 05/06/2022   CHOLHDL 2.5 05/06/2022    Lab Results  Component Value Date   HGBA1C 5.6 04/07/2022    Assessment & Plan    1. Dizziness/hypertension/orthostatic hypotension: She notes recent intermittent dizziness primarily with position changes and when walking.  She denies palpitations, presyncope, or syncope. Suspect hypotension/orthostatic hypotension primarily driving her symptoms.  Will change Lasix to as needed use as below. Will discuss with Dr. Bjorn Pippin best options for further de-escalation of antihypertensive regimen in the future if needed.  Given chronic systolic heart failure, would prefer to continue GDMT as tolerated.    2. CAD: S/p DES-mLAD, DES-RCA on 03/2022. Stable with no anginal symptoms.  Encouraged increased activity as tolerated. No ASA in the setting of Eliquis. Continue Plavix, carvedilol, Jardiance, Lasix, and Lipitor.    2. Chronic systolic heart failure/ICM/productive cough: Most recent echocardiogram in 04/2023 showed EF 30 to 35%, moderately decreased LV function, RWMA, G1 DD, normal RV systolic function, mild mitral valve regurgitation, no significant change from prior study. Euvolemic and well compensated on exam. Given concern for ongoing hypotension, will change Lasix to as needed use (weight gain, swelling).  Otherwise, continue current medications as above.   3. 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 Most recent device interrogation showed 0.3% A-fib burden, normal device function.  Denies any palpitations, dizziness.  Labs within the past 6 months including CBC, CMET, TSH were stable.  Recent chest x-ray was stable. She follows regularly with an eye doctor.  She does have progressive vision loss in the setting of macular degeneration. Continue Eliquis, amiodarone, carvedilol.   4. Hyperlipidemia: LDL was 60 in 01/2023.  Continue Lipitor.   5. R radial artery pseudoaneurysm: S/p surgical repair. Resolved.    6. Disposition: Follow-up next available with Dr. Bjorn Pippin.      Joylene Grapes, NP 06/01/2023, 11:50 AM

## 2023-06-07 ENCOUNTER — Encounter: Payer: Medicare PPO | Attending: Cardiology

## 2023-06-07 DIAGNOSIS — I495 Sick sinus syndrome: Secondary | ICD-10-CM | POA: Insufficient documentation

## 2023-06-07 LAB — CUP PACEART REMOTE DEVICE CHECK
Battery Remaining Longevity: 152 mo
Battery Remaining Longevity: 151 mo
Battery Voltage: 3.09 V
Battery Voltage: 3.09 V
Brady Statistic AP VP Percent: 0.04 %
Brady Statistic AP VP Percent: 0.1 %
Brady Statistic AP VS Percent: 91.19 %
Brady Statistic AP VS Percent: 76.59 %
Brady Statistic AS VP Percent: 0 %
Brady Statistic AS VP Percent: 0 %
Brady Statistic AS VS Percent: 8.76 %
Brady Statistic AS VS Percent: 23.31 %
Brady Statistic RA Percent Paced: 91.25 %
Brady Statistic RA Percent Paced: 76.72 %
Brady Statistic RV Percent Paced: 0.05 %
Brady Statistic RV Percent Paced: 0.1 %
Date Time Interrogation Session: 20241112222538
Date Time Interrogation Session: 20241113070512
Implantable Lead Connection Status: 753985
Implantable Lead Connection Status: 753985
Implantable Lead Connection Status: 753985
Implantable Lead Connection Status: 753985
Implantable Lead Implant Date: 20231103
Implantable Lead Implant Date: 20231103
Implantable Lead Implant Date: 20231103
Implantable Lead Implant Date: 20231103
Implantable Lead Location: 753859
Implantable Lead Location: 753860
Implantable Lead Location: 753859
Implantable Lead Location: 753860
Implantable Lead Model: 3830
Implantable Lead Model: 5076
Implantable Lead Model: 3830
Implantable Lead Model: 5076
Implantable Pulse Generator Implant Date: 20231103
Implantable Pulse Generator Implant Date: 20231103
Lead Channel Impedance Value: 285 Ohm
Lead Channel Impedance Value: 361 Ohm
Lead Channel Impedance Value: 399 Ohm
Lead Channel Impedance Value: 475 Ohm
Lead Channel Impedance Value: 285 Ohm
Lead Channel Impedance Value: 342 Ohm
Lead Channel Impedance Value: 380 Ohm
Lead Channel Impedance Value: 475 Ohm
Lead Channel Pacing Threshold Amplitude: 0.5 V
Lead Channel Pacing Threshold Amplitude: 1.375 V
Lead Channel Pacing Threshold Amplitude: 0.5 V
Lead Channel Pacing Threshold Amplitude: 1.125 V
Lead Channel Pacing Threshold Pulse Width: 0.4 ms
Lead Channel Pacing Threshold Pulse Width: 0.4 ms
Lead Channel Pacing Threshold Pulse Width: 0.4 ms
Lead Channel Pacing Threshold Pulse Width: 0.4 ms
Lead Channel Sensing Intrinsic Amplitude: 1.5 mV
Lead Channel Sensing Intrinsic Amplitude: 1.5 mV
Lead Channel Sensing Intrinsic Amplitude: 5 mV
Lead Channel Sensing Intrinsic Amplitude: 5 mV
Lead Channel Sensing Intrinsic Amplitude: 1.875 mV
Lead Channel Sensing Intrinsic Amplitude: 1.875 mV
Lead Channel Sensing Intrinsic Amplitude: 5.125 mV
Lead Channel Sensing Intrinsic Amplitude: 5.125 mV
Lead Channel Setting Pacing Amplitude: 1.5 V
Lead Channel Setting Pacing Amplitude: 2 V
Lead Channel Setting Pacing Amplitude: 1.5 V
Lead Channel Setting Pacing Amplitude: 2 V
Lead Channel Setting Pacing Pulse Width: 0.4 ms
Lead Channel Setting Pacing Pulse Width: 0.4 ms
Lead Channel Setting Sensing Sensitivity: 0.6 mV
Lead Channel Setting Sensing Sensitivity: 0.6 mV
Zone Setting Status: 755011
Zone Setting Status: 755011

## 2023-06-07 IMAGING — CT CT CARDIAC CORONARY ARTERY CALCIUM SCORE
3 series · 14 of 20 positions shown, 16 images · non-contrast
Comparison: None.
COMPARISON: None.

Addendum:
EXAM:
OVER-READ INTERPRETATION  CT CHEST

The following report is an over-read performed by radiologist Dr.
Cadence Lavigne [REDACTED] on 04/07/2021. This
over-read does not include interpretation of cardiac or coronary
anatomy or pathology. The coronary calcium score interpretation by
the cardiologist is attached.
CLINICAL DATA: Cardiovascular Disease Risk stratification
Coronary Calcium Score
TECHNIQUE: A gated, non-contrast computed tomography scan of the heart was
performed using 3mm slice thickness. Axial images were analyzed on a
dedicated workstation. Calcium scoring of the coronary arteries was
performed using the Agatston method.

[Series 2: cascseq 2.0 sa36 70% (id) · axial · 0.39mm/px · z∈[-202,-112]mm · 4 of 76 slices shown]
[im 16/76  vessel]
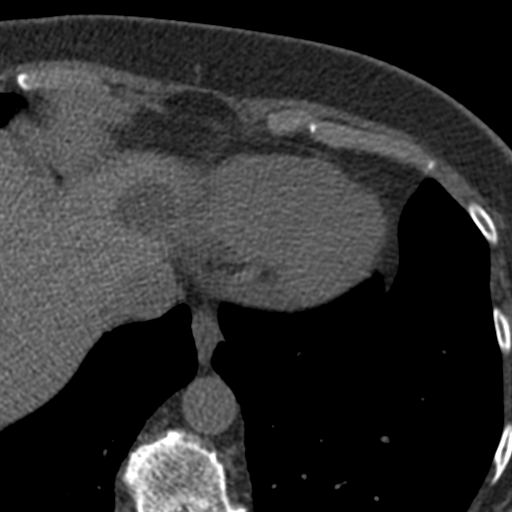
[im 31/76  vessel]
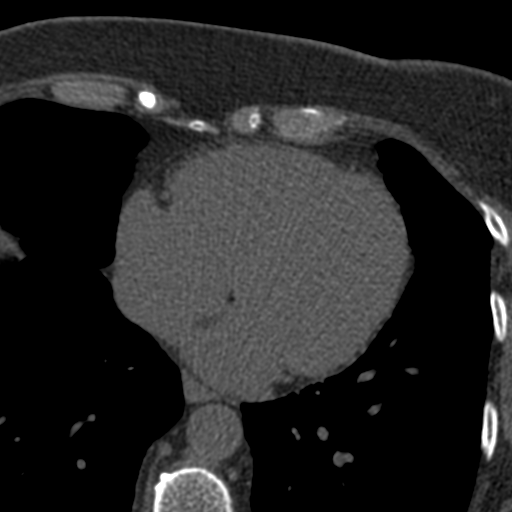
[im 46/76  vessel]
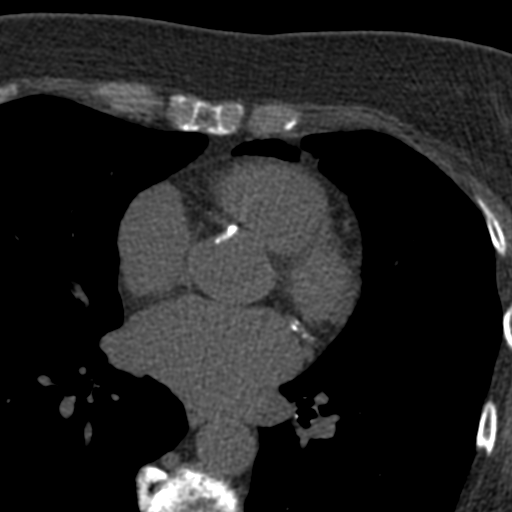
[im 61/76  vessel]
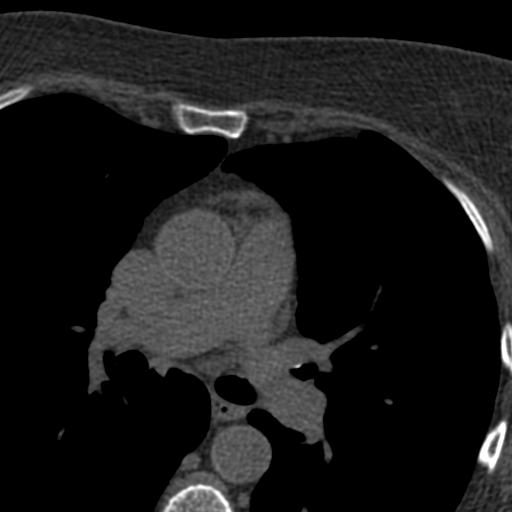

[Series 3: cascseq 2.0 bf37 st · axial · 0.67mm/px · z∈[-208,-108]mm · 5 of 76 slices shown, 7 images]
[im 13/76  vessel]
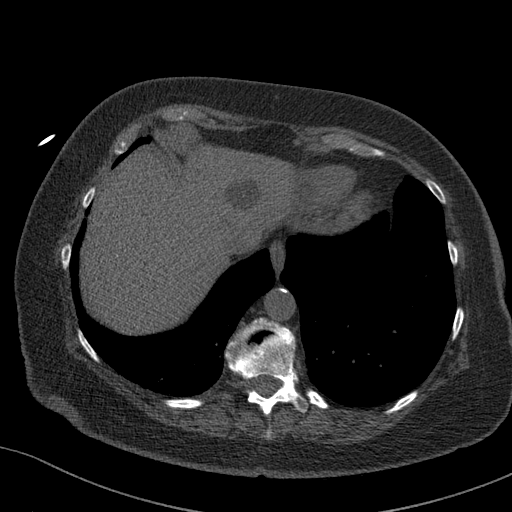
[im 13/76  lung]
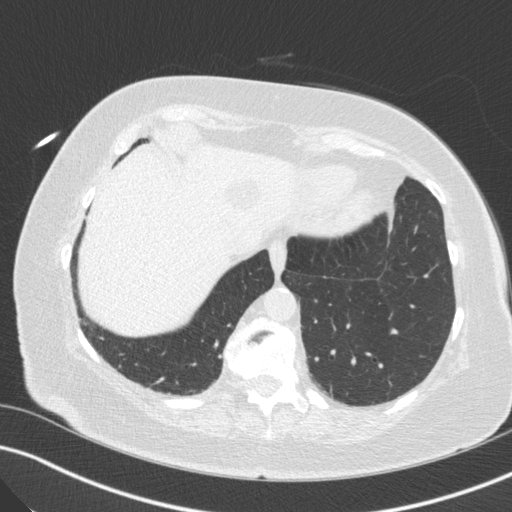
[im 26/76  vessel]
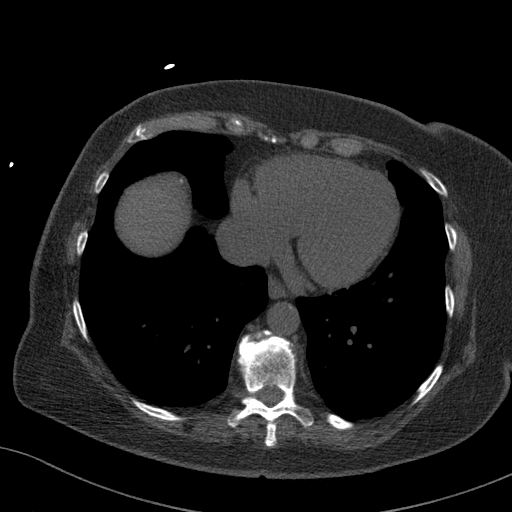
[im 38/76  vessel]
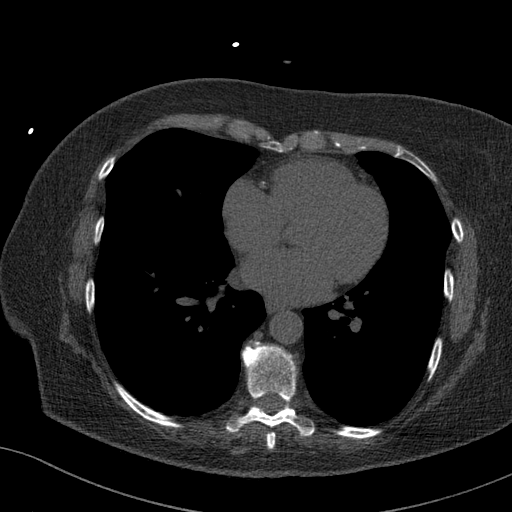
[im 51/76  vessel]
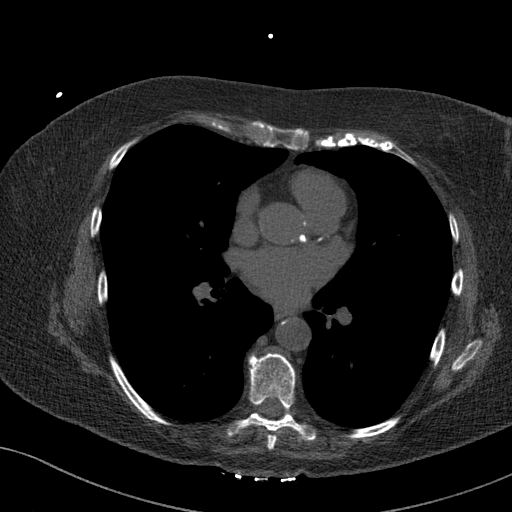
[im 63/76  vessel]
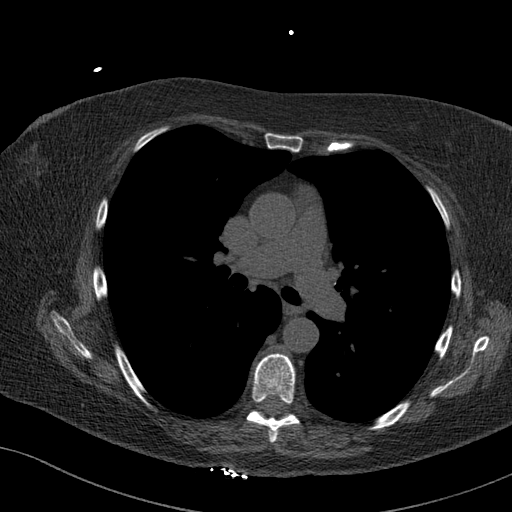
[im 63/76  lung]
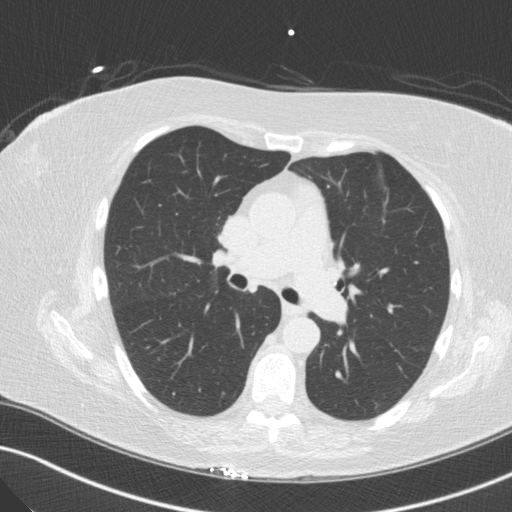

[Series 4: cascseq 2.0 br59 lung · axial · 0.67mm/px · z∈[-208,-108]mm · 5 of 76 slices shown]
[im 13/76  lung]
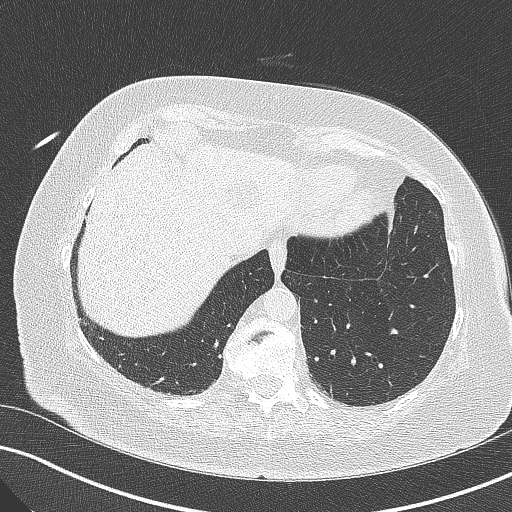
[im 26/76  lung]
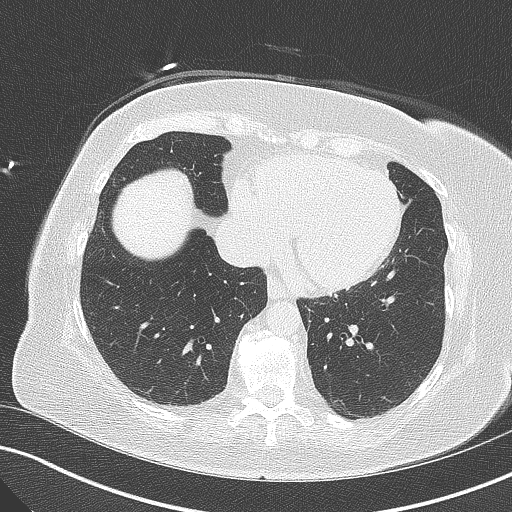
[im 38/76  lung]
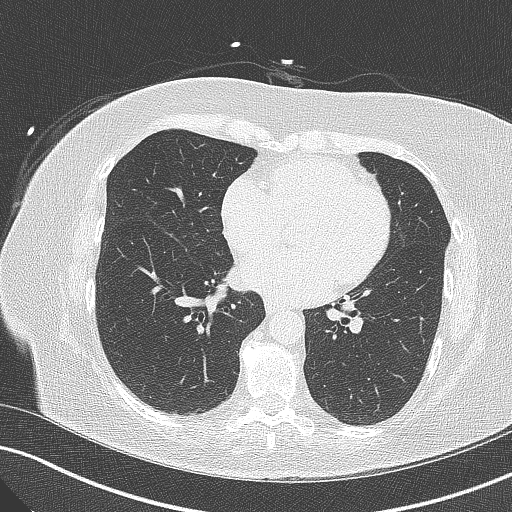
[im 51/76  lung]
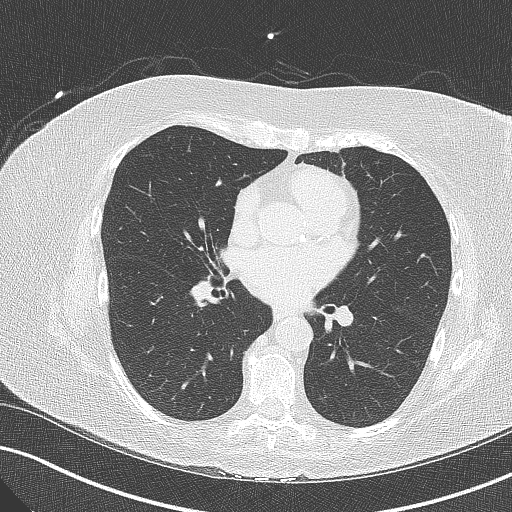
[im 63/76  lung]
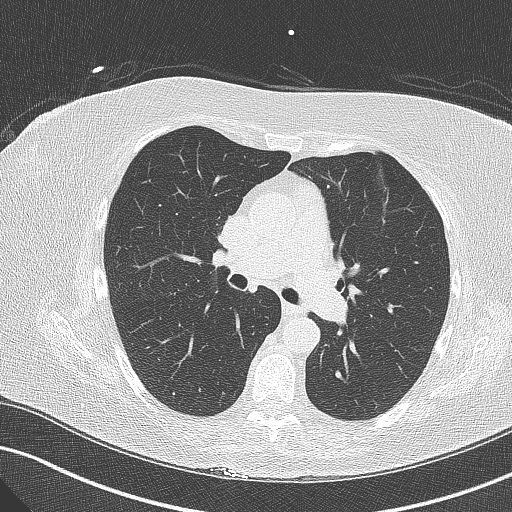

[14 of 20 positions shown; findings below may reference images not displayed]

FINDINGS: Vascular: Scattered calcified plaque in the visualized thoracic
aorta without aneurysmal disease.

Mediastinum/Nodes: Visualized mediastinum and hilar regions
demonstrate no lymphadenopathy or masses.

Lungs/Pleura: Visualized lungs show no evidence of pulmonary edema,
consolidation, pneumothorax, nodule or pleural fluid.

Upper Abdomen: Cyst in the left lobe of the liver measures
approximately 2.5 cm in greatest diameter and demonstrates internal
density consistent with a simple cyst. Two other subcentimeter
probable cysts are also present in the visualized left lobe.

Musculoskeletal: No chest wall mass or suspicious bone lesions
identified.
IMPRESSION: 1. Atherosclerosis of the thoracic aorta without visible aneurysmal
disease.
2. Benign appearing cysts in the liver.
FINDINGS: Coronary arteries: Normal origins.

Coronary Calcium Score:

Left main: 80

Left anterior descending artery: 175

Left circumflex artery: 83

Right coronary artery: 266

Total: 604

Percentile: 81st

Pericardium: Normal.

Ascending Aorta: Normal caliber.

Non-cardiac: See separate report from [REDACTED].
IMPRESSION: Coronary calcium score of 604 Agatston units. This was 81st
percentile for age-, race-, and sex-matched controls.



If CAC=0, it is reasonable to withhold statin therapy and reassess
in 5 to 10 years, as long as higher risk conditions are absent
(diabetes mellitus, family history of premature CHD in first degree
relatives (males <55 years; females <65 years), cigarette smoking,
or LDL >=190 mg/dL).

If CAC is 1 to 99, it is reasonable to initiate statin therapy for
patients >=55 years of age.

If CAC is >=100 or >=75th percentile, it is reasonable to initiate
statin therapy at any age.

Cardiology referral should be considered for patients with CAC
scores >=400 or >=75th percentile.

*4080 AHA/ACC/AACVPR/AAPA/ABC/KOJIMA/IMELDA/ZALEWSKI/Isidra/MISAEL/KOGAY/NASIM
Guideline on the Management of Blood Cholesterol: A Report of the
American College of Cardiology/American Heart Association Task Force
on Clinical Practice Guidelines. J Am Coll Cardiol.
9170;73(24):5521-5493.

*** End of Addendum ***
EXAM:
OVER-READ INTERPRETATION  CT CHEST

The following report is an over-read performed by radiologist Dr.
Cadence Lavigne [REDACTED] on 04/07/2021. This
over-read does not include interpretation of cardiac or coronary
anatomy or pathology. The coronary calcium score interpretation by
the cardiologist is attached.
FINDINGS: Vascular: Scattered calcified plaque in the visualized thoracic
aorta without aneurysmal disease.

Mediastinum/Nodes: Visualized mediastinum and hilar regions
demonstrate no lymphadenopathy or masses.

Lungs/Pleura: Visualized lungs show no evidence of pulmonary edema,
consolidation, pneumothorax, nodule or pleural fluid.

Upper Abdomen: Cyst in the left lobe of the liver measures
approximately 2.5 cm in greatest diameter and demonstrates internal
density consistent with a simple cyst. Two other subcentimeter
probable cysts are also present in the visualized left lobe.

Musculoskeletal: No chest wall mass or suspicious bone lesions
identified.
IMPRESSION: 1. Atherosclerosis of the thoracic aorta without visible aneurysmal
disease.
2. Benign appearing cysts in the liver.

## 2023-06-11 NOTE — Progress Notes (Unsigned)
Cardiology Office Note:    Date:  06/13/2023   ID:  Connie [name], Connie [name] [birth year], MRN [id number]  PCP:  Connie Hale, MD  Cardiologist:  Connie Ishikawa, MD  Electrophysiologist:  Connie Small, MD   Referring MD: Connie [name], *   Chief Complaint  Patient presents with   Coronary Artery Disease    History of Present Illness:    Connie [name] is a 82 y.o. female with a hx of paroxysmal atrial flutter, tachycardia-bradycardia syndrome s/p PPM hypertension, hypothyroidism, hyperlipidemia, GERD, psoriatic arthritis who presents for follow-up.  He was referred by Dr Connie [name] for evaluation of dyspnea and palpitations, initially seen on 03/10/2021.    Zio patch x7 days on 03/25/2021 showed 2% atrial flutter burden, average rate 141 bpm.  Longest episode lasted 15 minutes.  Also with occasional PACs, 2.3% of beats.  Echocardiogram 04/07/2021 showed normal biventricular function, no significant valvular disease.  Calcium score on 04/07/2021 was 604 (81st percentile).  Lexiscan Myoview on 08/10/2021 showed normal perfusion, EF 79%.  She was admitted 03/2022 with anterior STEMI, underwent DES to mid LAD; also noted to have severe mid RCA disease and underwent staged DES to RCA 04/11/2022.  Echocardiogram showed EF 35 to 40%.  During hospitalization noted to have A-fib with RVR and postconversion pauses up to 6 to 7 seconds, EP was consulted and PPM deferred.  Outpatient monitor showed significant pauses along with A-fib/flutter with RVR.  Underwent PPM 05/2022.  Echocardiogram 04/2023 showed EF 30 to 35%, normal RV function, mild mitral regurgitation.  Since last clinic visit, he reports he had dizziness, worse with standing.  Denies any syncope.  She denies any chest pain, dyspnea, or lower extremity edema.  She is taking Eliquis, denies any bleeding issues.  BP Readings from Last 3 Encounters:  06/13/23 (!) 148/78  06/01/23 122/70  03/31/23 128/84      Past  Medical History:  Diagnosis Date   Arthritis    Coronary artery disease    Depression    Dysrhythmia    GERD (gastroesophageal reflux disease)    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   Multiple, noted on [place]   History of hiatal hernia    Hypertension    Hypothyroidism    Macular degeneration    Psoriatic arthritis (HCC)    Sinus drainage    UTI (urinary tract infection)     Past Surgical History:  Procedure Laterality Date   BACK SURGERY  2000   Dr Newell Coral   bladder tack  2005   COLONOSCOPY     CORONARY ANGIOPLASTY     CORONARY STENT INTERVENTION N/A 04/11/2022   Procedure: CORONARY STENT INTERVENTION;  Surgeon: Tonny Bollman, MD;  Location[facility] INVASIVE CV LAB;  Service: Cardiovascular;  Laterality: N/A;   DILATION AND CURETTAGE OF UTERUS     after miscarriage   FALSE ANEURYSM REPAIR Right 11/09/2022   Procedure: 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;  Surgeon: Leonie Douglas, MD;  Location: [facility] OR;  Service: Vascular;  Laterality: Right;   INSERT / REPLACE / REMOVE PACEMAKER     LEFT HEART CATH AND CORONARY ANGIOGRAPHY N/A 04/07/2022   Procedure: LEFT HEART CATH AND CORONARY ANGIOGRAPHY;  Surgeon: Tonny Bollman, MD;  Location: [facility] INVASIVE CV LAB;  Service: Cardiovascular;  Laterality: N/A;   PACEMAKER IMPLANT N/A 05/27/2022   Procedure: PACEMAKER IMPLANT;  Surgeon: Connie Small, MD;  Location: [facility] INVASIVE CV LAB;  Service: Cardiovascular;  Laterality: N/A;   RECTAL PROLAPSE  REPAIR  2008   SCAR REVISION Right 2017   Knee, Dr. Luiz Blare   TONSILLECTOMY     as a child   TOTAL KNEE ARTHROPLASTY Right 09/05/2014   Procedure: TOTAL KNEE ARTHROPLASTY;  Surgeon: Harvie Junior, MD;  Location: [facility] OR;  Service: Orthopedics;  Laterality: Right;   TOTAL KNEE ARTHROPLASTY Left 01/10/2022   Procedure: TOTAL KNEE ARTHROPLASTY;  Surgeon: Jodi Geralds, MD;  Location: [facility] ORS;  Service: Orthopedics;  Laterality: Left;   TOTAL SHOULDER ARTHROPLASTY Left 11/17/2017   Procedure: LEFT  TOTAL SHOULDER ARTHROPLASTY;  Surgeon: Jodi Geralds, MD;  Location: [facility] ORS;  Service: Orthopedics;  Laterality: Left;  with block   TUBAL LIGATION  1972    Current Medications: Current Meds  Medication Sig   amiodarone (PACERONE) 200 MG tablet TAKE 1 TABLET(200 MG) BY MOUTH DAILY   apixaban (ELIQUIS) 5 MG TABS tablet TAKE 1 TABLET(5 MG) BY MOUTH TWICE DAILY   aspirin EC 81 MG tablet Take 1 tablet (81 mg total) by mouth daily. Swallow whole.   atorvastatin (LIPITOR) 80 MG tablet TAKE 1 TABLET(80 MG) BY MOUTH DAILY   buPROPion (WELLBUTRIN XL) 150 MG 24 hr tablet Take 150 mg by mouth daily.   carvedilol (COREG) 3.125 MG tablet Take 1 tablet (3.125 mg total) by mouth 2 (two) times daily.   empagliflozin (JARDIANCE) 10 MG TABS tablet TAKE 1 TABLET(10 MG) BY MOUTH DAILY   furosemide (LASIX) 20 MG tablet TAKE 1 TABLET BY MOUTH EVERY MONDAY, WEDNESDAY AND FRIDAY AS DIRECTED (Patient taking differently: Take 20 mg by mouth daily as needed for fluid or edema.)   levofloxacin (LEVAQUIN) 750 MG tablet Take 750 mg by mouth daily.   levothyroxine (SYNTHROID) 100 MCG tablet Take 100 mcg by mouth daily before breakfast.   nitroGLYCERIN (NITROSTAT) 0.4 MG SL tablet Place 1 tablet (0.4 mg total) under the tongue every 5 (five) minutes x 3 doses as needed for chest pain.   sertraline (ZOLOFT) 100 MG tablet Take 100 mg by mouth daily.   traMADol (ULTRAM) 50 MG tablet Take 1 tablet (50 mg total) by mouth every 6 (six) hours as needed.   [DISCONTINUED] clopidogrel (PLAVIX) 75 MG tablet Take 1 tablet (75 mg total) by mouth daily.     Allergies:   Penicillins and Sulfa antibiotics   Social History   Socioeconomic History   Marital status: Married    Spouse name: Connie [name]   Number of children: 2   Years of education: Not on file   Highest education level: Associate degree: academic program  Occupational History   Occupation: Retired  Tobacco Use   Smoking status: Never    Passive exposure: Never   Smokeless  tobacco: Never  Vaping Use   Vaping status: Never Used  Substance and Sexual Activity   Alcohol use: Yes    Comment: rarely wine   Drug use: No   Sexual activity: Not on file  Other Topics Concern   Not on file  Social History Narrative   Lives at home with husband.  Son and daughter.    Social Determinants of Health   Financial Resource Strain: Low Risk  (04/11/2022)   Overall Financial Resource Strain (CARDIA)    Difficulty of Paying Living Expenses: Not hard at all  Food Insecurity: No Food Insecurity (05/27/2022)   Hunger Vital Sign    Worried About Running Out of Food in the Last Year: Never true    Ran Out of Food in the Last Year: Never true  Transportation Needs: No Transportation Needs (05/27/2022)   PRAPARE - Administrator, Civil Service (Medical): No    Lack of Transportation (Non-Medical): No  Physical Activity: Not on file  Stress: Not on file  Social Connections: Not on file     Family History: The patient's family history includes Cancer - Lung in her mother; Heart attack (age of onset: 39) in her father. There is no history of Breast cancer.  ROS:   Please see the history of present illness.     All other systems reviewed and are negative.  EKGs/Labs/Other Studies Reviewed:    The following studies were reviewed today:   EKG:  EKG is not  ordered today  Recent Labs: 11/04/2022: ALT 47; BUN 23; Creatinine, Ser 1.31; Hemoglobin 13.3; Platelets 296; Potassium 4.9; Sodium 135  Recent Lipid Panel    Component Value Date/Time   CHOL 108 05/06/2022 0959   TRIG 91 05/06/2022 0959   HDL 44 05/06/2022 0959   CHOLHDL 2.5 05/06/2022 0959   CHOLHDL 2.5 04/07/2022 1044   VLDL 21 04/07/2022 1044   LDLCALC 46 05/06/2022 0959    Physical Exam:    VS:  BP (!) 148/78   Pulse 90   Ht 5\' 1"  (1.549 m)   Wt 149 lb (67.6 kg)   SpO2 95%   BMI 28.15 kg/m     Wt Readings from Last 3 Encounters:  06/13/23 149 lb (67.6 kg)  06/01/23 148 lb 12.8 oz  (67.5 kg)  03/31/23 151 lb 3.2 oz (68.6 kg)     GEN:  Well nourished, well developed in no acute distress HEENT: Normal NECK: No JVD; No carotid bruits LYMPHATICS: No lymphadenopathy CARDIAC: RRR, no murmurs, rubs, gallops RESPIRATORY:  Clear to auscultation without rales, wheezing or rhonchi  ABDOMEN: Soft, non-tender, non-distended MUSCULOSKELETAL:  No edema; No deformity  SKIN: Warm and dry NEUROLOGIC:  Alert and oriented x 3 PSYCHIATRIC:  Normal affect   ASSESSMENT:    1. Dizziness   2. Coronary artery disease involving native coronary artery of native heart without angina pectoris   3. Chronic combined systolic and diastolic heart failure (HCC)   4. Hyperlipidemia LDL goal <70   5. PAF (paroxysmal atrial fibrillation) (HCC)       PLAN:    Lightheadedness: Unclear cause.  Orthostatics in clinic today show systolic BP lying to standing but not meeting criteria for orthostatic hypotension.  Lasix has been changed to as needed.  Referred to neurology for evaluation  Atrial fibrillation/flutter: Tachycardia-bradycardia syndrome, having A-fib with RVR and long postconversion pauses.  Status post PPM 05/2022, follows with EP -Continue Eliquis 5 mg twice daily -Continue carvedilol 3.125 mg twice daily -Continue amiodarone 200 mg daily.  Check LFTs, TSH  CAD: Status post anterior STEMI 03/2022, underwent DES to mid LAD and then staged DES to RCA. -Currently on Eliquis and Plavix.  As it has been a year since her PCI, can switch from Plavix to aspirin 81 mg -Continue carvedilol -Continue atorvastatin 80 mg daily  Chronic combined systolic and diastolic heart failure: Echocardiogram 04/2023 showed EF 30 to 35%, normal RV function, mild mitral regurgitation. -Continue carvedilol 3.125 mg twice daily -Continue Jardiance 10 mg daily -Continue Lasix as needed -GDMT limited by soft BP  Right radial pseudoaneurysm: Status post surgical repair  Hyperlipidemia: Continue atorvastatin  80 mg daily.  LDL 60 01/2023  RTC in 6 months    Medication Adjustments/Labs and Tests Ordered: Current medicines are reviewed at length with  the patient today.  Concerns regarding medicines are outlined above.  Orders Placed This Encounter  Procedures   Comprehensive Metabolic Panel (CMET)   TSH   CBC w/Diff/Platelet   Ambulatory referral to Neurology     Meds ordered this encounter  Medications   aspirin EC 81 MG tablet    Sig: Take 1 tablet (81 mg total) by mouth daily. Swallow whole.     There are no Patient Instructions on file for this visit.   Signed, Connie Ishikawa, MD  06/13/2023 12:33 PM    [facility]

## 2023-06-13 ENCOUNTER — Ambulatory Visit: Payer: Medicare PPO | Attending: Cardiology | Admitting: Cardiology

## 2023-06-13 VITALS — BP 148/78 | HR 90 | Ht 61.0 in | Wt 149.0 lb

## 2023-06-13 DIAGNOSIS — I5042 Chronic combined systolic (congestive) and diastolic (congestive) heart failure: Secondary | ICD-10-CM | POA: Diagnosis not present

## 2023-06-13 DIAGNOSIS — R42 Dizziness and giddiness: Secondary | ICD-10-CM

## 2023-06-13 DIAGNOSIS — I2510 Atherosclerotic heart disease of native coronary artery without angina pectoris: Secondary | ICD-10-CM | POA: Diagnosis not present

## 2023-06-13 DIAGNOSIS — E785 Hyperlipidemia, unspecified: Secondary | ICD-10-CM | POA: Diagnosis not present

## 2023-06-13 DIAGNOSIS — I480 Paroxysmal atrial fibrillation: Secondary | ICD-10-CM

## 2023-06-13 MED ORDER — ASPIRIN 81 MG PO TBEC: 81.0000 mg | DELAYED_RELEASE_TABLET | Freq: Every day | ORAL | Status: DC

## 2023-06-13 NOTE — Patient Instructions (Signed)
Medication Instructions:  Stop Plavix  Start Aspirin 81 mg daily *If you need a refill on your cardiac medications before your next appointment, please call your pharmacy*   Lab Work: CMET, TSH. CBC If you have labs (blood work) drawn today and your tests are completely normal, you will receive your results only by: MyChart Message (if you have MyChart) OR A paper copy in the mail If you have any lab test that is abnormal or we need to change your treatment, we will call you to review the results.   Testing/Procedures: NONE   Follow-Up: At [facility], you and your health needs are our priority.  As part of our continuing mission to provide you with exceptional heart care, we have created designated Provider Care Teams.  These Care Teams include your primary Cardiologist (physician) and Advanced Practice Providers (APPs -  Physician Assistants and Nurse Practitioners) who all work together to provide you with the care you need, when you need it.  We recommend signing up for the patient portal called "MyChart".  Sign up information is provided on this After Visit Summary.  MyChart is used to connect with patients for Virtual Visits (Telemedicine).  Patients are able to view lab/test results, encounter notes, upcoming appointments, etc.  Non-urgent messages can be sent to your provider as well.   To learn more about what you can do with MyChart, go to [email or web address].    Your next appointment:   3 month(s)  Provider:   Little Ishikawa, MD     Other Instructions Refferal to Neurology

## 2023-06-14 ENCOUNTER — Other Ambulatory Visit: Payer: Self-pay | Admitting: *Deleted

## 2023-06-14 DIAGNOSIS — R748 Abnormal levels of other serum enzymes: Secondary | ICD-10-CM

## 2023-06-14 LAB — COMPREHENSIVE METABOLIC PANEL
ALT: 67 [IU]/L — ABNORMAL HIGH (ref 0–32)
AST: 80 [IU]/L — ABNORMAL HIGH (ref 0–40)
Albumin: 4.1 g/dL (ref 3.7–4.7)
Alkaline Phosphatase: 180 [IU]/L — ABNORMAL HIGH (ref 44–121)
BUN/Creatinine Ratio: 12 (ref 12–28)
BUN: 14 mg/dL (ref 8–27)
Bilirubin Total: 0.6 mg/dL (ref 0.0–1.2)
CO2: 23 mmol/L (ref 20–29)
Calcium: 9.2 mg/dL (ref 8.7–10.3)
Chloride: 102 mmol/L (ref 96–106)
Creatinine, Ser: 1.17 mg/dL — ABNORMAL HIGH (ref 0.57–1.00)
Globulin, Total: 2.1 g/dL (ref 1.5–4.5)
Glucose: 83 mg/dL (ref 70–99)
Potassium: 5 mmol/L (ref 3.5–5.2)
Sodium: 138 mmol/L (ref 134–144)
Total Protein: 6.2 g/dL (ref 6.0–8.5)
eGFR: 46 mL/min/{1.73_m2} — ABNORMAL LOW (ref >59)

## 2023-06-14 LAB — CBC WITH DIFFERENTIAL/PLATELET
Basophils Absolute: 0.1 10*3/uL (ref 0.0–0.2)
Basos: 1 %
EOS (ABSOLUTE): 0.1 10*3/uL (ref 0.0–0.4)
Eos: 1 %
Hematocrit: 40.7 % (ref 34.0–46.6)
Hemoglobin: 13.1 g/dL (ref 11.1–15.9)
Immature Grans (Abs): 0 10*3/uL (ref 0.0–0.1)
Immature Granulocytes: 0 %
Lymphocytes Absolute: 1.4 10*3/uL (ref 0.7–3.1)
Lymphs: 15 %
MCH: 27.4 pg (ref 26.6–33.0)
MCHC: 32.2 g/dL (ref 31.5–35.7)
MCV: 85 fL (ref 79–97)
Monocytes Absolute: 1.2 10*3/uL — ABNORMAL HIGH (ref 0.1–0.9)
Monocytes: 13 %
Neutrophils Absolute: 6.5 10*3/uL (ref 1.4–7.0)
Neutrophils: 70 %
Platelets: 280 10*3/uL (ref 150–450)
RBC: 4.78 x10E6/uL (ref 3.77–5.28)
RDW: 13.8 % (ref 11.7–15.4)
WBC: 9.3 10*3/uL (ref 3.4–10.8)

## 2023-06-14 LAB — TSH: TSH: 0.549 u[IU]/mL (ref 0.450–4.500)

## 2023-06-14 MED ORDER — AMIODARONE HCL 200 MG PO TABS: 100.0000 mg | ORAL_TABLET | Freq: Every day | ORAL | Status: DC

## 2023-06-16 ENCOUNTER — Encounter: Payer: Self-pay | Admitting: Neurology

## 2023-06-29 NOTE — Progress Notes (Signed)
Remote pacemaker transmission.   

## 2023-07-30 ENCOUNTER — Other Ambulatory Visit: Payer: Self-pay | Admitting: Nurse Practitioner

## 2023-07-31 ENCOUNTER — Ambulatory Visit: Payer: Medicare PPO | Admitting: Neurology

## 2023-07-31 ENCOUNTER — Other Ambulatory Visit: Payer: Self-pay | Admitting: Cardiology

## 2023-08-01 MED ORDER — EMPAGLIFLOZIN 10 MG PO TABS: 10.0000 mg | ORAL_TABLET | Freq: Every day | ORAL | 3 refills | Status: DC

## 2023-08-09 ENCOUNTER — Encounter: Payer: Self-pay | Admitting: Neurology

## 2023-08-09 ENCOUNTER — Ambulatory Visit: Payer: Medicare PPO | Admitting: Neurology

## 2023-08-09 ENCOUNTER — Other Ambulatory Visit: Payer: Medicare PPO

## 2023-08-09 VITALS — BP 149/85 | HR 80 | Ht 61.0 in | Wt 150.2 lb

## 2023-08-09 DIAGNOSIS — R42 Dizziness and giddiness: Secondary | ICD-10-CM

## 2023-08-09 DIAGNOSIS — G629 Polyneuropathy, unspecified: Secondary | ICD-10-CM | POA: Diagnosis not present

## 2023-08-09 NOTE — Progress Notes (Signed)
 NEUROLOGY CONSULTATION NOTE  Connie [name] MRN: [id number] DOB: 08-27-1940  Referring provider: Dr. Carson Clara Primary care provider: Dr. Denna Fish  Reason for consult:  dizziness  Dear Dr Alda Amas:  Thank you for your kind referral of Connie [name] for consultation of the above symptoms. Although her history is well known to you, please allow me to reiterate it for the purpose of our medical record. The patient was accompanied to the clinic by her husband  who also provides collateral information. Records and images were personally reviewed where available.   HISTORY OF PRESENT ILLNESS: This is a very pleasant 83 year old right-handed woman with a history of paroxysmal atrial flutter, tachycardia-bradycardia syndrome s/p PPM, hypertension, hyperlipidemia, hypothyroidism, psoriatric arthritis, macular degeneration, presenting for evaluation of dizziness. They report that dizziness started after her heart attack in 03/2022. She states it was not soon after. Dizziness only occurs when standing and walking, she describes a lightheaded feeling with good and bad days. There is no spinning component, no nausea/vomiting, tinnitus, hearing loss, focal numbness/tingling/weakness, speech difficulties, or confusion. She would sit down and the dizziness resolves. Every now and then she sees double when reading fine print. She has occasional tingling and burning in both feet when lying in bed at night, none when walking/active. Hands are unaffected. No neck/back pain, bladder dysfunction, anosmia, or tremors. She has some constipation. She has had some trouble pulling words out lately, independent of the dizziness. Her husband manages finances due to her macular degeneration. Her last fall was in 2023. No family history of similar symptoms. No unresponsiveness or loss of consciousness. She gets at least 7 hours of sleep and still naps in the day. Her husband has noticed that she  tends to lean to one side (possible the right) when she is walking sometimes. He feels he has to hold on to her at times feeling she may fall. She notes her right foot has a tendency to turn outward.    PAST MEDICAL HISTORY: 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   Multiple, noted on US    History of hiatal hernia    Hypertension    Hypothyroidism    Macular degeneration    Psoriatic arthritis (HCC)    Sinus drainage    UTI (urinary tract infection)     PAST SURGICAL HISTORY: Past Surgical History:  Procedure Laterality Date   BACK SURGERY  2000   Dr Cipriano Creeks   bladder tack  2005   COLONOSCOPY     CORONARY ANGIOPLASTY     CORONARY STENT INTERVENTION N/A 04/11/2022   Procedure: CORONARY STENT INTERVENTION;  Surgeon: Arnoldo Lapping, MD;  Location: [facility] INVASIVE CV LAB;  Service: Cardiovascular;  Laterality: N/A;   DILATION AND CURETTAGE OF UTERUS     after miscarriage   FALSE ANEURYSM REPAIR Right 11/09/2022   Procedure: 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;  Surgeon: Carlene Che, MD;  Location: [facility] OR;  Service: Vascular;  Laterality: Right;   INSERT / REPLACE / REMOVE PACEMAKER     LEFT HEART CATH AND CORONARY ANGIOGRAPHY N/A 04/07/2022   Procedure: LEFT HEART CATH AND CORONARY ANGIOGRAPHY;  Surgeon: Arnoldo Lapping, MD;  Location: [facility] INVASIVE CV LAB;  Service: Cardiovascular;  Laterality: N/A;   PACEMAKER IMPLANT N/A 05/27/2022   Procedure: PACEMAKER IMPLANT;  Surgeon:  Mealor, Donnamae Gaba, MD;  Location: [facility] INVASIVE CV LAB;  Service: Cardiovascular;  Laterality: N/A;   RECTAL PROLAPSE REPAIR  2008   SCAR REVISION Right 2017   Knee, Dr. Murrell Arrant   TONSILLECTOMY     as a child   TOTAL KNEE ARTHROPLASTY Right 09/05/2014   Procedure: TOTAL KNEE ARTHROPLASTY;  Surgeon: Boston Byers, MD;  Location: [facility] OR;  Service: Orthopedics;   Laterality: Right;   TOTAL KNEE ARTHROPLASTY Left 01/10/2022   Procedure: TOTAL KNEE ARTHROPLASTY;  Surgeon: Neil Balls, MD;  Location: [facility] ORS;  Service: Orthopedics;  Laterality: Left;   TOTAL SHOULDER ARTHROPLASTY Left 11/17/2017   Procedure: LEFT TOTAL SHOULDER ARTHROPLASTY;  Surgeon: Neil Balls, MD;  Location: [facility] ORS;  Service: Orthopedics;  Laterality: Left;  with block   TUBAL LIGATION  1972    MEDICATIONS: Current Outpatient Medications on File Prior to Visit  Medication Sig Dispense Refill   amiodarone  (PACERONE ) 200 MG tablet Take 0.5 tablets (100 mg total) by mouth daily.     apixaban  (ELIQUIS ) 5 MG TABS tablet TAKE 1 TABLET(5 MG) BY MOUTH TWICE DAILY 60 tablet 5   aspirin  EC 81 MG tablet Take 1 tablet (81 mg total) by mouth daily. Swallow whole.     atorvastatin  (LIPITOR ) 80 MG tablet TAKE 1 TABLET(80 MG) BY MOUTH DAILY 90 tablet 3   buPROPion (WELLBUTRIN XL) 150 MG 24 hr tablet Take 150 mg by mouth daily.     carvedilol  (COREG ) 3.125 MG tablet Take 1 tablet (3.125 mg total) by mouth 2 (two) times daily. 180 tablet 3   empagliflozin  (JARDIANCE ) 10 MG TABS tablet Take 1 tablet (10 mg total) by mouth daily. 90 tablet 3   furosemide  (LASIX ) 20 MG tablet TAKE 1 TABLET BY MOUTH EVERY MONDAY, WEDNESDAY AND FRIDAY AS DIRECTED (Patient taking differently: Take 20 mg by mouth daily as needed for fluid or edema.) 15 tablet 11   levofloxacin (LEVAQUIN) 750 MG tablet Take 750 mg by mouth daily.     levothyroxine  (SYNTHROID ) 100 MCG tablet Take 100 mcg by mouth daily before breakfast.     nitroGLYCERIN  (NITROSTAT ) 0.4 MG SL tablet Place 1 tablet (0.4 mg total) under the tongue every 5 (five) minutes x 3 doses as needed for chest pain. 25 tablet 3   sertraline  (ZOLOFT ) 100 MG tablet Take 100 mg by mouth daily.     traMADol  (ULTRAM ) 50 MG tablet Take 1 tablet (50 mg total) by mouth every 6 (six) hours as needed. 10 tablet 0   No current facility-administered medications on file prior to  visit.    ALLERGIES: Allergies  Allergen Reactions   Penicillins Rash and Other (See Comments)    Has patient had a PCN reaction causing immediate rash, facial/tongue/throat swelling, SOB or lightheadedness with hypotension: Yes Has patient had a PCN reaction causing severe rash involving mucus membranes or skin necrosis: No Has patient had a PCN reaction that required hospitalization: No Has patient had a PCN reaction occurring within the last 10 years: No If all of the above answers are "NO", then may proceed with Cephalosporin use.    Sulfa Antibiotics Rash    FAMILY HISTORY: Family History  Problem Relation Age of Onset   Cancer - Lung Mother    Heart attack Father 59   Breast cancer Neg Hx     SOCIAL HISTORY: Social History   Socioeconomic History   Marital status: Married    Spouse name: Marylou Sobers   Number of children: 2  Years of education: Not on file   Highest education level: Associate degree: academic program  Occupational History   Occupation: Retired  Tobacco Use   Smoking status: Never    Passive exposure: Never   Smokeless tobacco: Never  Vaping Use   Vaping status: Never Used  Substance and Sexual Activity   Alcohol  use: Not Currently    Comment: rarely wine   Drug use: No   Sexual activity: Not on file  Other Topics Concern   Not on file  Social History Narrative   Lives at home with husband.  Son and daughter.  Right handed    Social Drivers of Health   Financial Resource Strain: Low Risk  (04/11/2022)   Overall Financial Resource Strain (CARDIA)    Difficulty of Paying Living Expenses: Not hard at all  Food Insecurity: No Food Insecurity (05/27/2022)   Hunger Vital Sign    Worried About Running Out of Food in the Last Year: Never true    Ran Out of Food in the Last Year: Never true  Transportation Needs: No Transportation Needs (05/27/2022)   PRAPARE - Administrator, Civil Service (Medical): No    Lack of Transportation  (Non-Medical): No  Physical Activity: Not on file  Stress: Not on file  Social Connections: Not on file  Intimate Partner Violence: Not At Risk (05/27/2022)   Humiliation, Afraid, Rape, and Kick questionnaire    Fear of Current or Ex-Partner: No    Emotionally Abused: No    Physically Abused: No    Sexually Abused: No     PHYSICAL EXAM: Vitals:   08/09/23 1305  BP: (!) 149/85  Pulse: 80  SpO2: 98%   General: No acute distress Head:  Normocephalic/atraumatic Skin/Extremities: No rash, no edema Neurological Exam: Mental status: alert and awake, no dysarthria or aphasia, Fund of knowledge is appropriate.   Attention and concentration are normal.  Cranial nerves: CN I: not tested CN II: pupils equal, round, visual fields intact CN III, IV, VI:  full range of motion, no nystagmus, no ptosis CN V: facial sensation intact CN VII: upper and lower face symmetric CN VIII: hearing intact to conversation Bulk & Tone: normal, no fasciculations. Motor: 5/5 throughout with no pronator drift. Sensation: intact to light touch, cold, pin on both UE. Decreased temperature to both ankles,decreased vibration sense to right knee, left ankle. Intact pinprick sensation. Romberg test slight sway Deep Tendon Reflexes: brisk +2 throughout except for absent ankle jerks, no ankle clonus Plantar responses: downgoing bilaterally Cerebellar: no incoordination on finger to nose, heel to shin. No dysdiadochokinesia Gait: slow and cautious, no ataxia Tremor: minimal endpoint tremor bilaterally   IMPRESSION: This is a very pleasant 83 year old right-handed woman with a history of paroxysmal atrial flutter, tachycardia-bradycardia syndrome s/p PPM, hypertension, hyperlipidemia, hypothyroidism, psoriatric arthritis, macular degeneration, presenting for evaluation of dizziness. Her neurological exam shows a length-dependent neuropathy which can cause a sensation of imbalance but not lightheadedness. No cerebellar  signs on exam. Etiology of lightheadedness on standing unclear, she has had benign orthostatic findings at her Cardiology office. From a neurological standpoint, we will do a brain MRI with and without contrast to assess for underlying structural abnormality. Neuropathy labs will be ordered. We discussed balance therapy, she would like to hold off for now. Our office will call with results, if normal, follow-up as needed. Call for any changes.     Thank you for allowing me to participate in the care of this  patient. Please do not hesitate to call for any questions or concerns.   Rayfield Cairo, M.D.  CC: Dr. Alda Amas, Dr. Donia Furlough

## 2023-08-09 NOTE — Patient Instructions (Addendum)
 Good to meet you.  Have bloodwork done for B12, B1, ESR, CRP, SPEP, IFE, folate  Schedule MRI brain with and without contrast  3. Let me know if you would like to proceed with Balance therapy  4. Our office will call with results. If normal, follow-up as needed. Call for any changes

## 2023-08-13 LAB — IMMUNOFIXATION ELECTROPHORESIS
IgG (Immunoglobin G), Serum: 863 mg/dL (ref 600–1540)
IgM, Serum: 75 mg/dL (ref 50–300)
Immunoglobulin A: 863 mg/dL (ref 70–320)

## 2023-08-13 LAB — C-REACTIVE PROTEIN: CRP: <3 mg/L (ref <8.0)

## 2023-08-13 LAB — FOLATE: Folate: 15.9 ng/mL

## 2023-08-13 LAB — PROTEIN ELECTROPHORESIS, SERUM
Albumin ELP: 4.1 g/dL (ref 3.8–4.8)
Alpha 1: 0.3 g/dL (ref 0.2–0.3)
Alpha 2: 0.6 g/dL (ref 0.5–0.9)
Beta 2: 0.3 g/dL (ref 0.2–0.5)
Beta Globulin: 0.5 g/dL (ref 0.4–0.6)
Gamma Globulin: 0.8 g/dL (ref 0.8–1.7)
Total Protein: 6.5 g/dL (ref 6.1–8.1)

## 2023-08-13 LAB — VITAMIN B12: Vitamin B-12: 344 pg/mL (ref 200–1100)

## 2023-08-13 LAB — VITAMIN B1: Vitamin B1 (Thiamine): 14 nmol/L (ref 8–30)

## 2023-08-13 LAB — SEDIMENTATION RATE: Sed Rate: 2 mm/h (ref 0–30)

## 2023-08-16 ENCOUNTER — Telehealth: Payer: Self-pay

## 2023-08-16 NOTE — Telephone Encounter (Signed)
-----   Message from Van Clines sent at 08/14/2023 10:07 AM EST ----- Pls let her know bloodwork overall looks good. Proceed with brain MRI, thanks

## 2023-08-16 NOTE — Telephone Encounter (Signed)
Pt called an informed that bloodwork overall looks good. Proceed with brain MRI pt cancelled her MRI, pt advised that she can call [facility] to schedule it when she ready

## 2023-08-24 DIAGNOSIS — H353134 Nonexudative age-related macular degeneration, bilateral, advanced atrophic with subfoveal involvement: Secondary | ICD-10-CM | POA: Diagnosis not present

## 2023-08-24 DIAGNOSIS — H20042 Secondary noninfectious iridocyclitis, left eye: Secondary | ICD-10-CM | POA: Diagnosis not present

## 2023-08-24 DIAGNOSIS — H43812 Vitreous degeneration, left eye: Secondary | ICD-10-CM | POA: Diagnosis not present

## 2023-08-24 DIAGNOSIS — H35032 Hypertensive retinopathy, left eye: Secondary | ICD-10-CM | POA: Diagnosis not present

## 2023-08-25 DIAGNOSIS — H6123 Impacted cerumen, bilateral: Secondary | ICD-10-CM | POA: Diagnosis not present

## 2023-08-25 DIAGNOSIS — R2681 Unsteadiness on feet: Secondary | ICD-10-CM | POA: Diagnosis not present

## 2023-08-25 DIAGNOSIS — F321 Major depressive disorder, single episode, moderate: Secondary | ICD-10-CM | POA: Diagnosis not present

## 2023-08-25 DIAGNOSIS — I10 Essential (primary) hypertension: Secondary | ICD-10-CM | POA: Diagnosis not present

## 2023-08-25 DIAGNOSIS — I2510 Atherosclerotic heart disease of native coronary artery without angina pectoris: Secondary | ICD-10-CM | POA: Diagnosis not present

## 2023-08-25 DIAGNOSIS — E039 Hypothyroidism, unspecified: Secondary | ICD-10-CM | POA: Diagnosis not present

## 2023-08-28 ENCOUNTER — Telehealth: Payer: Self-pay | Admitting: Neurology

## 2023-08-28 ENCOUNTER — Ambulatory Visit: Payer: Medicare PPO | Admitting: Neurology

## 2023-08-28 NOTE — Telephone Encounter (Signed)
Patient husband called to let us know that the imaging place told him that the testing would have to be done a hospital and for [place] to send the referral to the hospital   Patient has a pacemaker

## 2023-08-29 ENCOUNTER — Other Ambulatory Visit: Payer: Self-pay | Admitting: Nurse Practitioner

## 2023-08-29 ENCOUNTER — Ambulatory Visit: Payer: Medicare PPO | Admitting: Gastroenterology

## 2023-08-29 ENCOUNTER — Other Ambulatory Visit: Payer: Self-pay

## 2023-08-29 ENCOUNTER — Encounter: Payer: Self-pay | Admitting: Gastroenterology

## 2023-08-29 ENCOUNTER — Other Ambulatory Visit (INDEPENDENT_AMBULATORY_CARE_PROVIDER_SITE_OTHER): Payer: Medicare PPO

## 2023-08-29 VITALS — BP 136/70 | HR 64 | Ht 61.0 in | Wt 149.0 lb

## 2023-08-29 DIAGNOSIS — R748 Abnormal levels of other serum enzymes: Secondary | ICD-10-CM | POA: Diagnosis not present

## 2023-08-29 DIAGNOSIS — I5042 Chronic combined systolic (congestive) and diastolic (congestive) heart failure: Secondary | ICD-10-CM

## 2023-08-29 DIAGNOSIS — K5900 Constipation, unspecified: Secondary | ICD-10-CM

## 2023-08-29 DIAGNOSIS — K219 Gastro-esophageal reflux disease without esophagitis: Secondary | ICD-10-CM | POA: Diagnosis not present

## 2023-08-29 DIAGNOSIS — R7401 Elevation of levels of liver transaminase levels: Secondary | ICD-10-CM | POA: Diagnosis not present

## 2023-08-29 LAB — IBC + FERRITIN
Ferritin: 31.3 ng/mL (ref 10.0–291.0)
Iron: 48 ug/dL (ref 42–145)
Saturation Ratios: 11.5 % — ABNORMAL LOW (ref 20.0–50.0)
TIBC: 417.2 ug/dL (ref 250.0–450.0)
Transferrin: 298 mg/dL (ref 212.0–360.0)

## 2023-08-29 LAB — HEPATIC FUNCTION PANEL
ALT: 86 U/L — ABNORMAL HIGH (ref 0–35)
AST: 89 U/L — ABNORMAL HIGH (ref 0–37)
Albumin: 4 g/dL (ref 3.5–5.2)
Alkaline Phosphatase: 157 U/L — ABNORMAL HIGH (ref 39–117)
Bilirubin, Direct: 0.2 mg/dL (ref 0.0–0.3)
Total Bilirubin: 0.6 mg/dL (ref 0.2–1.2)
Total Protein: 6.6 g/dL (ref 6.0–8.3)

## 2023-08-29 LAB — CK: Total CK: 61 U/L (ref 7–177)

## 2023-08-29 MED ORDER — ESOMEPRAZOLE MAGNESIUM 20 MG PO CPDR
20.0000 mg | DELAYED_RELEASE_CAPSULE | ORAL | Status: AC | PRN
Start: 2023-08-29 — End: ?

## 2023-08-29 MED ORDER — POLYETHYLENE GLYCOL 3350 17 G PO PACK: 17.0000 g | PACK | Freq: Every day | ORAL | Status: AC

## 2023-08-29 NOTE — Progress Notes (Signed)
 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  She is accompanied by her husband today.  She thinks her liver enzymes have been elevated on a few draws over the past year.  She is not aware of any personal history of liver disease or family history of liver disease.  She denies any history of jaundice.  She denies any abdominal pains that bother her.  She does not drink any alcohol .  She denies any myopathy but does have history of arthritis.  On review of her labs she has had LFTs checked in April and November 2024.  Prior to that time she has had historically normal liver enzymes.  Mild transaminitis noted in April of last year with AST and ALT into the 40s and alk phos in 120s.  In November this had risen to AST in the 80s and alk phos in the 180s.  Bilirubin normal.  She states her medication list has been stable over time.  Of note she has been on amiodarone  since she had a heart attack in September 2023.  Previously was on 200 mg daily and has since been on 100 mg daily in recent months.  She did have an echo in October of this past year with an EF of 30 to 35%.  She denies any cardiopulmonary symptoms currently and states her cardiac functioning seems to be okay.  She has not had liver enzymes checked since November.  She denies any herbal supplements or over-the-counter supplements otherwise.  She does have some indigestion with reflux that bothers her at times.  She takes Nexium  but very infrequently.  She states infrequent enough where she is not sure how much it helps her.  She denies any dysphagia, no nausea or vomiting.  No postprandial abdominal pains.  She does have some  baseline constipation.  Has hard stools roughly every 2 to 3 days.  No blood in her stools.  Her last colonoscopy was many years ago and she is not sure what it showed, it was done at [facility] from what she can report.  She has not been taking anything for her bowels recently.  Of note she did have an ultrasound of the right upper quadrant in August 2024 which did not show any concerning pathology.  Prior workup: RUQ US  02/2023: IMPRESSION: No cholelithiasis or sonographic evidence of acute cholecystitis.   Elevated LAES 10/2022 and 05/2023  Lab Results  Component Value Date   ALT 67 (H) 06/13/2023   AST 80 (H) 06/13/2023   ALKPHOS 180 (H) 06/13/2023   BILITOT 0.6 06/13/2023     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   Multiple, noted on US    History of hiatal hernia    Hypertension    Hypothyroidism    Macular degeneration    Psoriatic arthritis (HCC)    Sinus drainage    UTI (urinary tract infection)      Past Surgical History:  Procedure Laterality Date   BACK SURGERY  2000   Dr Alix   bladder tack  2005   COLONOSCOPY  CORONARY ANGIOPLASTY     CORONARY STENT INTERVENTION N/A 04/11/2022   Procedure: CORONARY STENT INTERVENTION;  Surgeon: [name], MD;  Location: [facility] INVASIVE CV LAB;  Service: Cardiovascular;  Laterality: N/A;   DILATION AND CURETTAGE OF UTERUS     after miscarriage   FALSE ANEURYSM REPAIR Right 11/09/2022   Procedure: 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;  Surgeon: [name], MD;  Location: [facility] OR;  Service: Vascular;  Laterality: Right;   INSERT / REPLACE / REMOVE PACEMAKER     LEFT HEART CATH AND CORONARY ANGIOGRAPHY N/A 04/07/2022   Procedure: LEFT HEART CATH AND CORONARY ANGIOGRAPHY;  Surgeon: [name], MD;  Location: [facility] INVASIVE CV LAB;  Service: Cardiovascular;   Laterality: N/A;   PACEMAKER IMPLANT N/A 05/27/2022   Procedure: PACEMAKER IMPLANT;  Surgeon: [name], MD;  Location: [facility] INVASIVE CV LAB;  Service: Cardiovascular;  Laterality: N/A;   RECTAL PROLAPSE REPAIR  2008   SCAR REVISION Right 2017   Knee, Dr. Yvone   TONSILLECTOMY     as a child   TOTAL KNEE ARTHROPLASTY Right 09/05/2014   Procedure: TOTAL KNEE ARTHROPLASTY;  Surgeon: [name], MD;  Location: [facility] OR;  Service: Orthopedics;  Laterality: Right;   TOTAL KNEE ARTHROPLASTY Left 01/10/2022   Procedure: TOTAL KNEE ARTHROPLASTY;  Surgeon: [name], MD;  Location: [facility] ORS;  Service: Orthopedics;  Laterality: Left;   TOTAL SHOULDER ARTHROPLASTY Left 11/17/2017   Procedure: LEFT TOTAL SHOULDER ARTHROPLASTY;  Surgeon: [name], MD;  Location: [facility] ORS;  Service: Orthopedics;  Laterality: Left;  with block   TUBAL LIGATION  1972   Family History  Problem Relation Age of Onset   Cancer - Lung Mother    Heart attack Father 67   Breast cancer Neg Hx    Social History   Tobacco Use   Smoking status: Never    Passive exposure: Never   Smokeless tobacco: Never  Vaping Use   Vaping status: Never Used  Substance Use Topics   Alcohol  use: Not Currently    Comment: rarely wine   Drug use: No   Current Outpatient Medications  Medication Sig Dispense Refill   amiodarone  (PACERONE ) 200 MG tablet Take 0.5 tablets (100 mg total) by mouth daily.     apixaban  (ELIQUIS ) 5 MG TABS tablet TAKE 1 TABLET(5 MG) BY MOUTH TWICE DAILY 60 tablet 5   aspirin  EC 81 MG tablet Take 1 tablet (81 mg total) by mouth daily. Swallow whole.     atorvastatin  (LIPITOR ) 80 MG tablet TAKE 1 TABLET(80 MG) BY MOUTH DAILY 90 tablet 3   buPROPion (WELLBUTRIN XL) 150 MG 24 hr tablet Take 150 mg by mouth daily.     empagliflozin  (JARDIANCE ) 10 MG TABS tablet Take 1 tablet (10 mg total) by mouth daily. 90 tablet 3   furosemide  (LASIX ) 20 MG tablet TAKE 1 TABLET BY MOUTH EVERY MONDAY, WEDNESDAY AND FRIDAY  AS DIRECTED (Patient taking differently: Take 20 mg by mouth daily as needed for fluid or edema.) 15 tablet 11   levofloxacin (LEVAQUIN) 750 MG tablet Take 750 mg by mouth daily.     levothyroxine  (SYNTHROID ) 100 MCG tablet Take 100 mcg by mouth daily before breakfast.     nitroGLYCERIN  (NITROSTAT ) 0.4 MG SL tablet Place 1 tablet (0.4 mg total) under the tongue every 5 (five) minutes x 3 doses as needed for chest pain. 25 tablet 3   sertraline  (ZOLOFT ) 100 MG tablet Take 100 mg by mouth daily.  traMADol  (ULTRAM ) 50 MG tablet Take 1 tablet (50 mg total) by mouth every 6 (six) hours as needed. 10 tablet 0   carvedilol  (COREG ) 3.125 MG tablet Take 1 tablet (3.125 mg total) by mouth 2 (two) times daily. 180 tablet 3   No current facility-administered medications for this visit.   Allergies  Allergen Reactions   Penicillins Rash and Other (See Comments)    Has patient had a PCN reaction causing immediate rash, facial/tongue/throat swelling, SOB or lightheadedness with hypotension: Yes Has patient had a PCN reaction causing severe rash involving mucus membranes or skin necrosis: No Has patient had a PCN reaction that required hospitalization: No Has patient had a PCN reaction occurring within the last 10 years: No If all of the above answers are NO, then may proceed with Cephalosporin use.    Sulfa Antibiotics Rash     Review of Systems: All systems reviewed and negative except where noted in HPI.   Lab Results  Component Value Date   ALT 67 (H) 06/13/2023   AST 80 (H) 06/13/2023   ALKPHOS 180 (H) 06/13/2023   BILITOT 0.6 06/13/2023    Lab Results  Component Value Date   WBC 9.3 06/13/2023   HGB 13.1 06/13/2023   HCT 40.7 06/13/2023   MCV 85 06/13/2023   PLT 280 06/13/2023    Lab Results  Component Value Date   NA 138 06/13/2023   CL 102 06/13/2023   K 5.0 06/13/2023   CO2 23 06/13/2023   BUN 14 06/13/2023   CREATININE 1.17 (H) 06/13/2023   EGFR 46 (L) 06/13/2023    CALCIUM  9.2 06/13/2023   ALBUMIN  4.1 06/13/2023   GLUCOSE 83 06/13/2023     Physical Exam: BP 136/70   Pulse 64   Ht 5' 1 (1.549 m)   Wt 149 lb (67.6 kg)   BMI 28.15 kg/m  Constitutional: Pleasant,well-developed, female in no acute distress. HEENT: Normocephalic and atraumatic. Conjunctivae are normal. No scleral icterus. Neck supple.  Cardiovascular: Normal rate, regular rhythm.  Pulmonary/chest: Effort normal and breath sounds normal. No wheezing, rales or rhonchi. Abdominal: Soft, nondistended, nontender.  There are no masses palpable. No hepatomegaly. Extremities: no edema Neurological: Alert and oriented to person place and time. Skin: Skin is warm and dry. No rashes noted. Psychiatric: Normal mood and affect. Behavior is normal.   ASSESSMENT: 83 y.o. female here for assessment of the following  1. Elevated liver enzymes   2. Chronic combined systolic and diastolic congestive heart failure (HCC)   3. Constipation, unspecified constipation type   4. Gastroesophageal reflux disease, unspecified whether esophagitis present    Patient has had 2 sets of elevated liver enzymes, last April and November.  Transaminitis noticed, AST predominant.  Mild elevation in alk phos.  Discussed differential diagnosis.  She does have a history of CHF, hepatic congestion is possible though she has been doing better from a cardiopulmonary standpoint.  She has been on amiodarone  since 2023, that potentially could be related.  She denies any other medications or supplements that could be contributing.  She denies any symptoms of myopathy but with her AST greater than ALT elevation I would want to check her creatine kinase to make sure that is not related as well.  In addition to that would recheck her liver enzymes and send serologic workup to rule out other causes of chronic liver disease.  We discussed why we want to work this up and evaluate, to hopefully minimize hepatic inflammation and prevent  fibrotic change.  There is no evidence of cirrhosis or concerning pathology on her ultrasound.  If the elevation is very mild and no clear cause on her serologic workup, we may just monitor this but I will need to discuss the issue with her cardiologist regarding amiodarone  use in case it is related.  Hopefully we can avoid a liver biopsy in this patient.  I would only reserve that for rising liver enzymes that were significant with unclear cause.  Otherwise we discussed her constipation.  She is not really taking anything for her bowels.  Recommend MiraLAX  daily and titrate up as needed for goal bowel movement daily.  Will get records from her last colonoscopy to clarify findings.  She will continue Nexium  over-the-counter as needed for reflux, using very sparingly.  No alarm symptoms  PLAN: - labs today - LFTs, CK level, labs to assess for chronic liver diseases - take Miralax  daily and titrate as needed - continue nexium  PRN - may need to stop amiodarone  pending her course, if progressive rise in enzymes. Consider repeat imaging pending her course, hopefully can avoid a liver biopsy - obtain records from [facility] - last colonoscopy  Marcey Naval, MD [facility]  CC: [name]*

## 2023-08-29 NOTE — Patient Instructions (Signed)
 We will request your colonoscopy records from [facility].  Your provider has requested that you go to the basement level for lab work before leaving today. Press B on the elevator. The lab is located at the first door on the left as you exit the elevator.  Please purchase the following medications over the counter and take as directed:  Miralax  daily and Titrate as needed.  Continue Nexium  as needed. _______________________________________________________  If your blood pressure at your visit was 140/90 or greater, please contact your primary care physician to follow up on this.  _______________________________________________________  If you are age 55 or older, your body mass index should be between 23-30. Your Body mass index is 28.15 kg/m. If this is out of the aforementioned range listed, please consider follow up with your Primary Care Provider.  If you are age 42 or younger, your body mass index should be between 19-25. Your Body mass index is 28.15 kg/m. If this is out of the aformentioned range listed, please consider follow up with your Primary Care Provider.   ________________________________________________________  The [facility] providers would like to encourage you to use MYCHART to communicate with providers for non-urgent requests or questions.  Due to long hold times on the telephone, sending your provider a message by [facility] may be a faster and more efficient way to get a response.  Please allow 48 business hours for a response.  Please remember that this is for non-urgent requests.  _______________________________________________________  Thank you for entrusting me with your care and for choosing [facility], Dr. Elspeth Naval

## 2023-08-29 NOTE — Telephone Encounter (Signed)
Working getting PA for MRI

## 2023-08-29 NOTE — Progress Notes (Signed)
  Nexium added to patient med list

## 2023-08-31 ENCOUNTER — Telehealth: Payer: Self-pay

## 2023-08-31 NOTE — Telephone Encounter (Signed)
 PA for MRI approved PA number [id number] approval good from 26/202 - 11/28/2023

## 2023-09-02 LAB — IGG: IgG (Immunoglobin G), Serum: 886 mg/dL (ref 600–1540)

## 2023-09-02 LAB — HEPATITIS B SURFACE ANTIBODY, QUANTITATIVE: Hep B S AB Quant (Post): <5 m[IU]/mL — ABNORMAL LOW (ref >=10)

## 2023-09-02 LAB — HEPATITIS B SURFACE ANTIGEN: Hepatitis B Surface Ag: NONREACTIVE

## 2023-09-02 LAB — ALPHA-1-ANTITRYPSIN: A-1 Antitrypsin, Ser: 181 mg/dL (ref 83–199)

## 2023-09-02 LAB — HEPATITIS C ANTIBODY: Hepatitis C Ab: NONREACTIVE

## 2023-09-02 LAB — HEPATITIS A ANTIBODY, TOTAL: Hepatitis A AB,Total: NONREACTIVE

## 2023-09-02 LAB — ANTI-SMOOTH MUSCLE ANTIBODY, IGG: Actin (Smooth Muscle) Antibody (IGG): <20 U (ref <20)

## 2023-09-02 LAB — ANA: Anti Nuclear Antibody (ANA): NEGATIVE

## 2023-09-04 ENCOUNTER — Telehealth: Payer: Self-pay | Admitting: *Deleted

## 2023-09-04 ENCOUNTER — Other Ambulatory Visit: Payer: Self-pay

## 2023-09-04 DIAGNOSIS — R748 Abnormal levels of other serum enzymes: Secondary | ICD-10-CM

## 2023-09-04 MED ORDER — ATORVASTATIN CALCIUM 40 MG PO TABS: 40.0000 mg | ORAL_TABLET | Freq: Every day | ORAL | 3 refills | Status: AC

## 2023-09-04 NOTE — Telephone Encounter (Signed)
 Called and made patient and spouse aware per Dr. Alda Amas to stop Amiodarone  200 mg and decrease Atorvastatin  40mg  daily. Patient and spouse verbalized and understanding.

## 2023-09-06 ENCOUNTER — Ambulatory Visit: Payer: Medicare PPO

## 2023-09-06 ENCOUNTER — Encounter: Payer: Medicare PPO | Attending: Cardiology

## 2023-09-06 DIAGNOSIS — I495 Sick sinus syndrome: Secondary | ICD-10-CM | POA: Insufficient documentation

## 2023-09-07 LAB — CUP PACEART REMOTE DEVICE CHECK
Battery Remaining Longevity: 149 mo
Battery Voltage: 3.06 V
Brady Statistic AP VP Percent: 0.18 %
Brady Statistic AP VS Percent: 93.8 %
Brady Statistic AS VP Percent: 0 %
Brady Statistic AS VS Percent: 6.02 %
Brady Statistic RA Percent Paced: 93.98 %
Brady Statistic RV Percent Paced: 0.18 %
Date Time Interrogation Session: 20250212013546
Implantable Lead Connection Status: 753985
Implantable Lead Connection Status: 753985
Implantable Lead Implant Date: 20231103
Implantable Lead Implant Date: 20231103
Implantable Lead Location: 753859
Implantable Lead Location: 753860
Implantable Lead Model: 3830
Implantable Lead Model: 5076
Implantable Pulse Generator Implant Date: 20231103
Lead Channel Impedance Value: 285 Ohm
Lead Channel Impedance Value: 342 Ohm
Lead Channel Impedance Value: 399 Ohm
Lead Channel Impedance Value: 494 Ohm
Lead Channel Pacing Threshold Amplitude: 0.625 V
Lead Channel Pacing Threshold Amplitude: 1.125 V
Lead Channel Pacing Threshold Pulse Width: 0.4 ms
Lead Channel Pacing Threshold Pulse Width: 0.4 ms
Lead Channel Sensing Intrinsic Amplitude: 1.625 mV
Lead Channel Sensing Intrinsic Amplitude: 1.625 mV
Lead Channel Sensing Intrinsic Amplitude: 4.75 mV
Lead Channel Sensing Intrinsic Amplitude: 4.75 mV
Lead Channel Setting Pacing Amplitude: 1.5 V
Lead Channel Setting Pacing Amplitude: 2 V
Lead Channel Setting Pacing Pulse Width: 0.4 ms
Lead Channel Setting Sensing Sensitivity: 0.6 mV
Zone Setting Status: 755011

## 2023-09-10 ENCOUNTER — Other Ambulatory Visit: Payer: Self-pay | Admitting: Cardiovascular Disease

## 2023-09-17 NOTE — Progress Notes (Unsigned)
 Cardiology Office Note:    Date:  09/18/2023   ID:  Connie, [name] 03/27/1941, MRN [id number]  PCP:  Shon Hale, MD  Cardiologist:  Little Ishikawa, MD  Electrophysiologist:  Maurice Small, MD   Referring MD: Shon Hale, *   Chief Complaint  Patient presents with   Dizziness    Patient stated that she gets dizzy at times when when walking and when she stands up      History of Present Illness:    Connie [name] is a 83 y.o. female with a hx of paroxysmal atrial flutter, tachycardia-bradycardia syndrome s/p PPM hypertension, hypothyroidism, hyperlipidemia, GERD, psoriatic arthritis who presents for follow-up.  He was referred by Dr Chanetta Marshall for evaluation of dyspnea and palpitations, initially seen on 03/10/2021.    Zio patch x7 days on 03/25/2021 showed 2% atrial flutter burden, average rate 141 bpm.  Longest episode lasted 15 minutes.  Also with occasional PACs, 2.3% of beats.  Echocardiogram 04/07/2021 showed normal biventricular function, no significant valvular disease.  Calcium score on 04/07/2021 was 604 (81st percentile).  Lexiscan Myoview on 08/10/2021 showed normal perfusion, EF 79%.  She was admitted 03/2022 with anterior STEMI, underwent DES to mid LAD; also noted to have severe mid RCA disease and underwent staged DES to RCA 04/11/2022.  Echocardiogram showed EF 35 to 40%.  During hospitalization noted to have A-fib with RVR and postconversion pauses up to 6 to 7 seconds, EP was consulted and PPM deferred.  Outpatient monitor showed significant pauses along with A-fib/flutter with RVR.  Underwent PPM 05/2022.  Echocardiogram 04/2023 showed EF 30 to 35%, normal RV function, mild mitral regurgitation.  Since last clinic visit, she reports she is doing okay.  She continues to have lightheadedness, has upcoming brain MRI scheduled after seeing neurology.  She denies any syncope.  Denies any chest pain, dyspnea,  lower extremity edema, or  palpitations.  She is taking Eliquis, denies any bleeding issues.  Reports BP 100s to 120s at home.  Has not needed to take Lasix recently.  BP Readings from Last 3 Encounters:  09/18/23 (!) 140/72  08/29/23 136/70  08/09/23 (!) 149/85    Wt Readings from Last 3 Encounters:  09/18/23 147 lb 6.4 oz (66.9 kg)  08/29/23 149 lb (67.6 kg)  08/09/23 150 lb 3.2 oz (68.1 kg)     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   Multiple, noted on [place]   History of hiatal hernia    Hypertension    Hypothyroidism    Macular degeneration    Psoriatic arthritis (HCC)    Sinus drainage    UTI (urinary tract infection)     Past Surgical History:  Procedure Laterality Date   BACK SURGERY  2000   Dr Newell Coral   bladder tack  2005   COLONOSCOPY     CORONARY ANGIOPLASTY     CORONARY STENT INTERVENTION N/A 04/11/2022   Procedure: CORONARY STENT INTERVENTION;  Surgeon: Tonny Bollman, MD;  Location: [facility] INVASIVE CV LAB;  Service: Cardiovascular;  Laterality: N/A;   DILATION AND CURETTAGE OF UTERUS     after miscarriage   FALSE ANEURYSM REPAIR Right 11/09/2022   Procedure: 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;  Surgeon: Leonie Douglas, MD;  Location: [facility] OR;  Service: Vascular;  Laterality:  Right;   INSERT / REPLACE / REMOVE PACEMAKER     LEFT HEART CATH AND CORONARY ANGIOGRAPHY N/A 04/07/2022   Procedure: LEFT HEART CATH AND CORONARY ANGIOGRAPHY;  Surgeon: Tonny Bollman, MD;  Location: [facility] INVASIVE CV LAB;  Service: Cardiovascular;  Laterality: N/A;   PACEMAKER IMPLANT N/A 05/27/2022   Procedure: PACEMAKER IMPLANT;  Surgeon: Maurice Small, MD;  Location: [facility] INVASIVE CV LAB;  Service: Cardiovascular;  Laterality: N/A;   RECTAL PROLAPSE REPAIR  2008   SCAR REVISION Right 2017   Knee, Dr. Luiz Blare   TONSILLECTOMY     as a child   TOTAL KNEE  ARTHROPLASTY Right 09/05/2014   Procedure: TOTAL KNEE ARTHROPLASTY;  Surgeon: Harvie Junior, MD;  Location: [facility] OR;  Service: Orthopedics;  Laterality: Right;   TOTAL KNEE ARTHROPLASTY Left 01/10/2022   Procedure: TOTAL KNEE ARTHROPLASTY;  Surgeon: Jodi Geralds, MD;  Location: [facility] ORS;  Service: Orthopedics;  Laterality: Left;   TOTAL SHOULDER ARTHROPLASTY Left 11/17/2017   Procedure: LEFT TOTAL SHOULDER ARTHROPLASTY;  Surgeon: Jodi Geralds, MD;  Location: [facility] ORS;  Service: Orthopedics;  Laterality: Left;  with block   TUBAL LIGATION  1972    Current Medications: Current Meds  Medication Sig   apixaban (ELIQUIS) 5 MG TABS tablet TAKE 1 TABLET(5 MG) BY MOUTH TWICE DAILY   aspirin EC 81 MG tablet Take 1 tablet (81 mg total) by mouth daily. Swallow whole.   atorvastatin (LIPITOR) 40 MG tablet Take 1 tablet (40 mg total) by mouth daily.   buPROPion (WELLBUTRIN XL) 150 MG 24 hr tablet Take 150 mg by mouth daily.   empagliflozin (JARDIANCE) 10 MG TABS tablet Take 1 tablet (10 mg total) by mouth daily.   esomeprazole (NEXIUM) 20 MG capsule Take 1 capsule (20 mg total) by mouth as needed.   furosemide (LASIX) 20 MG tablet TAKE 1 TABLET BY MOUTH EVERY MONDAY, WEDNESDAY AND FRIDAY AS DIRECTED (Patient taking differently: Take 20 mg by mouth daily as needed for fluid or edema.)   levothyroxine (SYNTHROID) 100 MCG tablet Take 100 mcg by mouth daily before breakfast.   nitroGLYCERIN (NITROSTAT) 0.4 MG SL tablet PLACE 1 TABLET UNDER THE TONGUE EVERY 5 MINUTES FOR 3 DOSES AS NEEDED FOR CHEST PAIN   polyethylene glycol (MIRALAX) 17 g packet Take 17 g by mouth daily. Titrate as needed   sertraline (ZOLOFT) 100 MG tablet Take 100 mg by mouth daily.   traMADol (ULTRAM) 50 MG tablet Take 1 tablet (50 mg total) by mouth every 6 (six) hours as needed.     Allergies:   Penicillins and Sulfa antibiotics   Social History   Socioeconomic History   Marital status: Married    Spouse name: [name]   Number of  children: 2   Years of education: Not on file   Highest education level: Associate degree: academic program  Occupational History   Occupation: Retired   Occupation: retired  Tobacco Use   Smoking status: Never    Passive exposure: Never   Smokeless tobacco: Never  Vaping Use   Vaping status: Never Used  Substance and Sexual Activity   Alcohol use: Not Currently    Comment: rarely wine   Drug use: No   Sexual activity: Not Currently    Partners: Male    Birth control/protection: Post-menopausal    Comment: married  Other Topics Concern   Not on file  Social History Narrative   Lives at home with husband.  Son and daughter.  Right handed  Social Drivers of Corporate investment banker Strain: Low Risk  (04/11/2022)   Overall Financial Resource Strain (CARDIA)    Difficulty of Paying Living Expenses: Not hard at all  Food Insecurity: No Food Insecurity (05/27/2022)   Hunger Vital Sign    Worried About Running Out of Food in the Last Year: Never true    Ran Out of Food in the Last Year: Never true  Transportation Needs: No Transportation Needs (05/27/2022)   PRAPARE - Administrator, Civil Service (Medical): No    Lack of Transportation (Non-Medical): No  Physical Activity: Not on file  Stress: Not on file  Social Connections: Not on file     Family History: The patient's family history includes Cancer - Lung in her mother; Heart attack (age of onset: 49) in her father. There is no history of Breast cancer.  ROS:   Please see the history of present illness.     All other systems reviewed and are negative.  EKGs/Labs/Other Studies Reviewed:    The following studies were reviewed today:   EKG:  EKG is not  ordered today  Recent Labs: 06/13/2023: BUN 14; Creatinine, Ser 1.17; Hemoglobin 13.1; Platelets 280; Potassium 5.0; Sodium 138; TSH 0.549 08/29/2023: ALT 86  Recent Lipid Panel    Component Value Date/Time   CHOL 108 05/06/2022 0959   TRIG 91  05/06/2022 0959   HDL 44 05/06/2022 0959   CHOLHDL 2.5 05/06/2022 0959   CHOLHDL 2.5 04/07/2022 1044   VLDL 21 04/07/2022 1044   LDLCALC 46 05/06/2022 0959    Physical Exam:    VS:  BP (!) 140/72   Pulse 86   Ht 5\' 1"  (1.549 m)   Wt 147 lb 6.4 oz (66.9 kg)   SpO2 95%   BMI 27.85 kg/m     Wt Readings from Last 3 Encounters:  09/18/23 147 lb 6.4 oz (66.9 kg)  08/29/23 149 lb (67.6 kg)  08/09/23 150 lb 3.2 oz (68.1 kg)     GEN:  Well nourished, well developed in no acute distress HEENT: Normal NECK: No JVD; No carotid bruits LYMPHATICS: No lymphadenopathy CARDIAC: RRR, no murmurs, rubs, gallops RESPIRATORY:  Clear to auscultation without rales, wheezing or rhonchi  ABDOMEN: Soft, non-tender, non-distended MUSCULOSKELETAL:  No edema; No deformity  SKIN: Warm and dry NEUROLOGIC:  Alert and oriented x 3 PSYCHIATRIC:  Normal affect   ASSESSMENT:    1. Chronic combined systolic and diastolic heart failure (HCC)   2. Lightheadedness   3. Coronary artery disease involving native coronary artery of native heart without angina pectoris   4. Essential hypertension   5. PAF (paroxysmal atrial fibrillation) (HCC)   6. Hyperlipidemia, unspecified hyperlipidemia type     PLAN:    Lightheadedness: Unclear cause.  Orthostatics at prior clinic visit showed decrease in systolic BP lying to standing but not meeting criteria for orthostatic hypotension.  Lasix has been changed to as needed.  Referred to neurology for evaluation, brain MRI pending  Atrial fibrillation/flutter: Tachycardia-bradycardia syndrome, having A-fib with RVR and long postconversion pauses.  Status post PPM 05/2022, follows with EP -Continue Eliquis 5 mg twice daily -Continue carvedilol 3.125 mg twice daily -Developed transaminitis on amiodarone.  Seen by GI, recommended stopping amiodarone.  Amiodarone has been discontinued, will monitor for recurrence of A-fib  CAD: Status post anterior STEMI 03/2022, underwent  DES to mid LAD and then staged DES to RCA. -Continue Eliquis 5 mg twice daily, aspirin 81 mg daily -  Continue carvedilol -Continue atorvastatin, dose reduced to 40 mg daily due to transaminitis.   Chronic combined systolic and diastolic heart failure: Echocardiogram 04/2023 showed EF 30 to 35%, normal RV function, mild mitral regurgitation. -Continue carvedilol 3.125 mg twice daily -Continue Jardiance 10 mg daily -Continue Lasix as needed.  Appears euvolemic on exam -GDMT has been limited by soft BP.  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  Atorvastatin dose decreased to 40 mg daily due to transaminitis.  Recheck lipid panel at next clinic appointment   RTC in 3 months    Medication Adjustments/Labs and Tests Ordered: Current medicines are reviewed at length with the patient today.  Concerns regarding medicines are outlined above.  Orders Placed This Encounter  Procedures   Magnesium   Comprehensive Metabolic Panel (CMET)     No orders of the defined types were placed in this encounter.    Patient Instructions  Medication Instructions:  Continue curen medications *If you need a refill on your cardiac medications before your next appointment, please call your pharmacy*   Lab Work: Cmet, mg today If you have labs (blood work) drawn today and your tests are completely normal, you will receive your results only by: MyChart Message (if you have MyChart) OR A paper copy in the mail If you have any lab test that is abnormal or we need to change your treatment, we will call you to review the results.   Testing/Procedures: none   Follow-Up: At [facility], you and your health needs are our priority.  As part of our continuing mission to provide you with exceptional heart  care, we have created designated Provider Care Teams.  These Care Teams include your primary Cardiologist (physician) and Advanced Practice Providers (APPs -  Physician Assistants and Nurse Practitioners) who all work together to provide you with the care you need, when you need it.  We recommend signing up for the patient portal called "MyChart".  Sign up information is provided on this After Visit Summary.  MyChart is used to connect with patients for Virtual Visits (Telemedicine).  Patients are able to view lab/test results, encounter notes, upcoming appointments, etc.  Non-urgent messages can be sent to your provider as well.   To learn more about what you can do with MyChart, go to [email or web address].    Your next appointment:   3 month(s)  Provider:   Little Ishikawa, MD     Other Instructions none       Signed, Little Ishikawa, MD  09/18/2023 9:26 AM    [facility]

## 2023-09-18 ENCOUNTER — Encounter: Payer: Self-pay | Admitting: Cardiology

## 2023-09-18 ENCOUNTER — Ambulatory Visit: Payer: Medicare PPO | Attending: Cardiology | Admitting: Cardiology

## 2023-09-18 VITALS — BP 140/72 | HR 86 | Ht 61.0 in | Wt 147.4 lb

## 2023-09-18 DIAGNOSIS — I2510 Atherosclerotic heart disease of native coronary artery without angina pectoris: Secondary | ICD-10-CM

## 2023-09-18 DIAGNOSIS — I10 Essential (primary) hypertension: Secondary | ICD-10-CM

## 2023-09-18 DIAGNOSIS — I5042 Chronic combined systolic (congestive) and diastolic (congestive) heart failure: Secondary | ICD-10-CM

## 2023-09-18 DIAGNOSIS — I480 Paroxysmal atrial fibrillation: Secondary | ICD-10-CM | POA: Diagnosis not present

## 2023-09-18 DIAGNOSIS — R42 Dizziness and giddiness: Secondary | ICD-10-CM | POA: Diagnosis not present

## 2023-09-18 DIAGNOSIS — E785 Hyperlipidemia, unspecified: Secondary | ICD-10-CM

## 2023-09-18 LAB — COMPREHENSIVE METABOLIC PANEL
ALT: 83 [IU]/L — ABNORMAL HIGH (ref 0–32)
AST: 100 [IU]/L — ABNORMAL HIGH (ref 0–40)
Albumin: 4.1 g/dL (ref 3.7–4.7)
Alkaline Phosphatase: 149 [IU]/L — ABNORMAL HIGH (ref 44–121)
BUN/Creatinine Ratio: 15 (ref 12–28)
BUN: 18 mg/dL (ref 8–27)
Bilirubin Total: 0.6 mg/dL (ref 0.0–1.2)
CO2: 18 mmol/L — ABNORMAL LOW (ref 20–29)
Calcium: 9.9 mg/dL (ref 8.7–10.3)
Chloride: 104 mmol/L (ref 96–106)
Creatinine, Ser: 1.2 mg/dL — ABNORMAL HIGH (ref 0.57–1.00)
Globulin, Total: 2.5 g/dL (ref 1.5–4.5)
Glucose: 80 mg/dL (ref 70–99)
Sodium: 140 mmol/L (ref 134–144)
Total Protein: 6.6 g/dL (ref 6.0–8.5)
eGFR: 45 mL/min/{1.73_m2} — ABNORMAL LOW (ref >59)

## 2023-09-18 LAB — MAGNESIUM: Magnesium: 2.2 mg/dL (ref 1.6–2.3)

## 2023-09-18 NOTE — Patient Instructions (Signed)
 Medication Instructions:  Continue curen medications *If you need a refill on your cardiac medications before your next appointment, please call your pharmacy*   Lab Work: Cmet, mg today If you have labs (blood work) drawn today and your tests are completely normal, you will receive your results only by: MyChart Message (if you have MyChart) OR A paper copy in the mail If you have any lab test that is abnormal or we need to change your treatment, we will call you to review the results.   Testing/Procedures: none   Follow-Up: At [facility], you and your health needs are our priority.  As part of our continuing mission to provide you with exceptional heart care, we have created designated Provider Care Teams.  These Care Teams include your primary Cardiologist (physician) and Advanced Practice Providers (APPs -  Physician Assistants and Nurse Practitioners) who all work together to provide you with the care you need, when you need it.  We recommend signing up for the patient portal called "MyChart".  Sign up information is provided on this After Visit Summary.  MyChart is used to connect with patients for Virtual Visits (Telemedicine).  Patients are able to view lab/test results, encounter notes, upcoming appointments, etc.  Non-urgent messages can be sent to your provider as well.   To learn more about what you can do with MyChart, go to [email or web address].    Your next appointment:   3 month(s)  Provider:   Little Ishikawa, MD     Other Instructions none

## 2023-09-20 ENCOUNTER — Other Ambulatory Visit: Payer: Self-pay

## 2023-09-20 DIAGNOSIS — E875 Hyperkalemia: Secondary | ICD-10-CM

## 2023-09-21 ENCOUNTER — Encounter: Payer: Self-pay | Admitting: Cardiovascular Disease

## 2023-09-21 DIAGNOSIS — E875 Hyperkalemia: Secondary | ICD-10-CM | POA: Diagnosis not present

## 2023-09-22 LAB — BASIC METABOLIC PANEL
BUN/Creatinine Ratio: 13 (ref 12–28)
BUN: 17 mg/dL (ref 8–27)
CO2: 20 mmol/L (ref 20–29)
Calcium: 9 mg/dL (ref 8.7–10.3)
Chloride: 106 mmol/L (ref 96–106)
Creatinine, Ser: 1.26 mg/dL — ABNORMAL HIGH (ref 0.57–1.00)
Glucose: 110 mg/dL — ABNORMAL HIGH (ref 70–99)
Potassium: 5.3 mmol/L — ABNORMAL HIGH (ref 3.5–5.2)
Sodium: 142 mmol/L (ref 134–144)
eGFR: 42 mL/min/{1.73_m2} — ABNORMAL LOW (ref >59)

## 2023-09-26 ENCOUNTER — Ambulatory Visit (HOSPITAL_COMMUNITY)
Admission: RE | Admit: 2023-09-26 | Discharge: 2023-09-26 | Disposition: A | Discharge status: Home / Self Care | Payer: Medicare PPO | Source: Ambulatory Visit | Attending: Neurology | Admitting: Neurology

## 2023-09-26 DIAGNOSIS — R42 Dizziness and giddiness: Secondary | ICD-10-CM | POA: Insufficient documentation

## 2023-09-26 DIAGNOSIS — I6782 Cerebral ischemia: Secondary | ICD-10-CM | POA: Diagnosis not present

## 2023-09-26 DIAGNOSIS — G319 Degenerative disease of nervous system, unspecified: Secondary | ICD-10-CM | POA: Diagnosis not present

## 2023-09-26 MED ORDER — GADOBUTROL 1 MMOL/ML IV SOLN
6.0000 mL | Freq: Once | INTRAVENOUS | Status: AC | PRN
  Administered 2023-09-26: 6 mL via INTRAVENOUS

## 2023-09-27 ENCOUNTER — Other Ambulatory Visit: Payer: Self-pay

## 2023-09-27 DIAGNOSIS — E875 Hyperkalemia: Secondary | ICD-10-CM

## 2023-10-04 ENCOUNTER — Encounter: Payer: Self-pay | Admitting: Cardiology

## 2023-10-04 ENCOUNTER — Ambulatory Visit: Payer: Medicare PPO

## 2023-10-05 ENCOUNTER — Telehealth: Payer: Self-pay

## 2023-10-05 NOTE — Telephone Encounter (Signed)
-----   Message from Van Clines sent at 10/02/2023 11:09 PM EDT ----- Pls let her know the brain MRI looked overall okay, no tumor, stroke, or bleed. There was age-related changes and hardening of the small blood vessels seen in patients with blood pressure, cholesterol, glucose control conditions. No changes on brain to cause dizziness. Thanks

## 2023-10-05 NOTE — Telephone Encounter (Signed)
 Pt called an informed brain MRI looked overall okay, no tumor, stroke, or bleed. There was age-related changes and hardening of the small blood vessels seen in patients with blood pressure, cholesterol, glucose control conditions. No changes on brain to cause dizziness.

## 2023-10-06 NOTE — Telephone Encounter (Signed)
Needs BMET 

## 2023-10-10 DIAGNOSIS — E875 Hyperkalemia: Secondary | ICD-10-CM | POA: Diagnosis not present

## 2023-10-11 ENCOUNTER — Encounter: Payer: Self-pay | Admitting: *Deleted

## 2023-10-11 LAB — BASIC METABOLIC PANEL
BUN/Creatinine Ratio: 20 (ref 12–28)
BUN: 22 mg/dL (ref 8–27)
CO2: 22 mmol/L (ref 20–29)
Calcium: 9.3 mg/dL (ref 8.7–10.3)
Chloride: 102 mmol/L (ref 96–106)
Creatinine, Ser: 1.11 mg/dL — ABNORMAL HIGH (ref 0.57–1.00)
Glucose: 79 mg/dL (ref 70–99)
Potassium: 4.9 mmol/L (ref 3.5–5.2)
Sodium: 137 mmol/L (ref 134–144)
eGFR: 49 mL/min/{1.73_m2} — ABNORMAL LOW (ref >59)

## 2023-10-11 NOTE — Progress Notes (Signed)
 Remote pacemaker transmission.

## 2023-10-11 NOTE — Addendum Note (Signed)
 Addended by: Elease Etienne A on: 10/11/2023 04:53 PM   Modules accepted: Orders

## 2023-10-12 ENCOUNTER — Telehealth: Payer: Self-pay | Admitting: Gastroenterology

## 2023-10-12 NOTE — Telephone Encounter (Signed)
 Records from [facility] arrived:  EGD 10/24/2016 - Dr. Randa Evens - normal exam   NO colonoscopy records sent over.   Jan can you call [facility] to see if she ever had a colonoscopy with them? If so, I'd like it sent over, they only sent EGD. Thanks

## 2023-10-13 NOTE — Telephone Encounter (Signed)
 LM on VM for medical records to check and see if patient had colonoscopy.

## 2023-10-14 ENCOUNTER — Other Ambulatory Visit (HOSPITAL_COMMUNITY): Payer: Self-pay | Admitting: Cardiology

## 2023-10-16 NOTE — Telephone Encounter (Signed)
 Normal Colonoscopy report from 04-30-2007 rec'd and placed on Dr. Lanetta Inch desk for review.

## 2023-10-17 NOTE — Telephone Encounter (Signed)
 Colonoscopy records arrived - Dr. Randa Evens - 04/30/2007 - no polyps, diverticulosis in the sigmoid colon, and hemorrhoids.   No further routine screening due to age

## 2023-10-29 DIAGNOSIS — S93402A Sprain of unspecified ligament of left ankle, initial encounter: Secondary | ICD-10-CM | POA: Diagnosis not present

## 2023-10-29 DIAGNOSIS — X58XXXA Exposure to other specified factors, initial encounter: Secondary | ICD-10-CM | POA: Diagnosis not present

## 2023-10-29 DIAGNOSIS — S82832A Other fracture of upper and lower end of left fibula, initial encounter for closed fracture: Secondary | ICD-10-CM | POA: Diagnosis not present

## 2023-11-06 DIAGNOSIS — M19072 Primary osteoarthritis, left ankle and foot: Secondary | ICD-10-CM | POA: Diagnosis not present

## 2023-11-06 DIAGNOSIS — M25572 Pain in left ankle and joints of left foot: Secondary | ICD-10-CM | POA: Diagnosis not present

## 2023-11-12 ENCOUNTER — Other Ambulatory Visit: Payer: Self-pay | Admitting: Cardiology

## 2023-11-13 NOTE — Telephone Encounter (Signed)
 Prescription refill request for Eliquis  received. Indication:aflutter Last office visit:2/25 Scr:1.11  3/25 Age: 83 Weight:66.9  kg  Prescription refilled

## 2023-12-06 ENCOUNTER — Encounter: Payer: Medicare PPO | Attending: Cardiology

## 2023-12-06 DIAGNOSIS — I495 Sick sinus syndrome: Secondary | ICD-10-CM | POA: Insufficient documentation

## 2023-12-06 DIAGNOSIS — I255 Ischemic cardiomyopathy: Secondary | ICD-10-CM | POA: Diagnosis not present

## 2023-12-06 DIAGNOSIS — M25572 Pain in left ankle and joints of left foot: Secondary | ICD-10-CM | POA: Diagnosis not present

## 2023-12-06 DIAGNOSIS — H35032 Hypertensive retinopathy, left eye: Secondary | ICD-10-CM | POA: Diagnosis not present

## 2023-12-06 DIAGNOSIS — H353231 Exudative age-related macular degeneration, bilateral, with active choroidal neovascularization: Secondary | ICD-10-CM | POA: Diagnosis not present

## 2023-12-06 DIAGNOSIS — H20042 Secondary noninfectious iridocyclitis, left eye: Secondary | ICD-10-CM | POA: Diagnosis not present

## 2023-12-06 DIAGNOSIS — H353134 Nonexudative age-related macular degeneration, bilateral, advanced atrophic with subfoveal involvement: Secondary | ICD-10-CM | POA: Diagnosis not present

## 2023-12-06 DIAGNOSIS — H43812 Vitreous degeneration, left eye: Secondary | ICD-10-CM | POA: Diagnosis not present

## 2023-12-06 LAB — CUP PACEART REMOTE DEVICE CHECK
Battery Remaining Longevity: 146 mo
Battery Voltage: 3.04 V
Brady Statistic AP VP Percent: 0.82 %
Brady Statistic AP VS Percent: 80.54 %
Brady Statistic AS VP Percent: 0.05 %
Brady Statistic AS VS Percent: 18.58 %
Brady Statistic RA Percent Paced: 81.39 %
Brady Statistic RV Percent Paced: 0.87 %
Date Time Interrogation Session: 20250514033744
Implantable Lead Connection Status: 753985
Implantable Lead Connection Status: 753985
Implantable Lead Implant Date: 20231103
Implantable Lead Implant Date: 20231103
Implantable Lead Location: 753859
Implantable Lead Location: 753860
Implantable Lead Model: 3830
Implantable Lead Model: 5076
Implantable Pulse Generator Implant Date: 20231103
Lead Channel Impedance Value: 285 Ohm
Lead Channel Impedance Value: 342 Ohm
Lead Channel Impedance Value: 399 Ohm
Lead Channel Impedance Value: 475 Ohm
Lead Channel Pacing Threshold Amplitude: 0.5 V
Lead Channel Pacing Threshold Amplitude: 1 V
Lead Channel Pacing Threshold Pulse Width: 0.4 ms
Lead Channel Pacing Threshold Pulse Width: 0.4 ms
Lead Channel Sensing Intrinsic Amplitude: 1 mV
Lead Channel Sensing Intrinsic Amplitude: 1 mV
Lead Channel Sensing Intrinsic Amplitude: 5.25 mV
Lead Channel Sensing Intrinsic Amplitude: 5.25 mV
Lead Channel Setting Pacing Amplitude: 1.5 V
Lead Channel Setting Pacing Amplitude: 2 V
Lead Channel Setting Pacing Pulse Width: 0.4 ms
Lead Channel Setting Sensing Sensitivity: 0.6 mV
Zone Setting Status: 755011

## 2023-12-08 ENCOUNTER — Ambulatory Visit: Payer: Self-pay | Admitting: Cardiovascular Disease

## 2023-12-14 DIAGNOSIS — H353221 Exudative age-related macular degeneration, left eye, with active choroidal neovascularization: Secondary | ICD-10-CM | POA: Diagnosis not present

## 2023-12-18 NOTE — Progress Notes (Unsigned)
 Cardiology Office Note:    Date:  12/19/2023   ID:  Connie [name] Oct 11, 1940, MRN [id number]  PCP:  Connie Byers, MD  Cardiologist:  Connie Hamburg, MD  Electrophysiologist:  Connie Grange, MD   Referring MD: Connie [name], *   No chief complaint on file.   History of Present Illness:    Connie [name] is a 83 y.o. female with a hx of paroxysmal atrial flutter, tachycardia-bradycardia syndrome s/p PPM hypertension, hypothyroidism, hyperlipidemia, GERD, psoriatic arthritis who presents for follow-up.  He was referred by Dr Connie [name] for evaluation of dyspnea and palpitations, initially seen on 03/10/2021.    Zio patch x7 days on 03/25/2021 showed 2% atrial flutter burden, average rate 141 bpm.  Longest episode lasted 15 minutes.  Also with occasional PACs, 2.3% of beats.  Echocardiogram 04/07/2021 showed normal biventricular function, no significant valvular disease.  Calcium  score on 04/07/2021 was 604 (81st percentile).  Lexiscan  Myoview  on 08/10/2021 showed normal perfusion, EF 79%.  She was admitted 03/2022 with anterior STEMI, underwent DES to mid LAD; also noted to have severe mid RCA disease and underwent staged DES to RCA 04/11/2022.  Echocardiogram showed EF 35 to 40%.  During hospitalization noted to have A-fib with RVR and postconversion pauses up to 6 to 7 seconds, EP was consulted and PPM deferred.  Outpatient monitor showed significant pauses along with A-fib/flutter with RVR.  Underwent PPM 05/2022.  Echocardiogram 04/2023 showed EF 30 to 35%, normal RV function, mild mitral regurgitation.  Since last clinic visit,  Still ahving dizziness.  Had broken foot recently. Denies any chest pain, dyspnea, syncope, lower extremity edema, or palpitations.  she reports she is doing okay.  She continues to have lightheadedness, has upcoming brain MRI scheduled after seeing neurology.  She denies any syncope.  Denies any chest pain, dyspnea,  lower  extremity edema, or palpitations.  She is taking Eliquis , denies any bleeding issues.  Reports BP 100s to 120s at home.  Has not needed to take Lasix  recently.  BP Readings from Last 3 Encounters:  12/19/23 138/68  09/18/23 (!) 140/72  08/29/23 136/70    Wt Readings from Last 3 Encounters:  12/19/23 145 lb 3.2 oz (65.9 kg)  09/18/23 147 lb 6.4 oz (66.9 kg)  08/29/23 149 lb (67.6 kg)     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   Multiple, noted on US    History of hiatal hernia    Hypertension    Hypothyroidism    Macular degeneration    Psoriatic arthritis (HCC)    Sinus drainage    UTI (urinary tract infection)     Past Surgical History:  Procedure Laterality Date   BACK SURGERY  2000   Dr Cipriano Creeks   bladder tack  2005   COLONOSCOPY     CORONARY ANGIOPLASTY     CORONARY STENT INTERVENTION N/A 04/11/2022   Procedure: CORONARY STENT INTERVENTION;  Surgeon: Arnoldo Lapping, MD;  Location: [facility] INVASIVE CV LAB;  Service: Cardiovascular;  Laterality: N/A;   DILATION AND CURETTAGE OF UTERUS     after miscarriage   FALSE ANEURYSM REPAIR Right 11/09/2022   Procedure: 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;  Surgeon: Carlene Che, MD;  Location: [facility] OR;  Service: Vascular;  Laterality: Right;   INSERT / REPLACE /  REMOVE PACEMAKER     LEFT HEART CATH AND CORONARY ANGIOGRAPHY N/A 04/07/2022   Procedure: LEFT HEART CATH AND CORONARY ANGIOGRAPHY;  Surgeon: Arnoldo Lapping, MD;  Location: [facility] INVASIVE CV LAB;  Service: Cardiovascular;  Laterality: N/A;   PACEMAKER IMPLANT N/A 05/27/2022   Procedure: PACEMAKER IMPLANT;  Surgeon: Connie Grange, MD;  Location: [facility] INVASIVE CV LAB;  Service: Cardiovascular;  Laterality: N/A;   RECTAL PROLAPSE REPAIR  2008   SCAR REVISION Right 2017   Knee, Dr. Murrell Arrant   TONSILLECTOMY     as a child    TOTAL KNEE ARTHROPLASTY Right 09/05/2014   Procedure: TOTAL KNEE ARTHROPLASTY;  Surgeon: Boston Byers, MD;  Location: [facility] OR;  Service: Orthopedics;  Laterality: Right;   TOTAL KNEE ARTHROPLASTY Left 01/10/2022   Procedure: TOTAL KNEE ARTHROPLASTY;  Surgeon: Neil Balls, MD;  Location: [facility] ORS;  Service: Orthopedics;  Laterality: Left;   TOTAL SHOULDER ARTHROPLASTY Left 11/17/2017   Procedure: LEFT TOTAL SHOULDER ARTHROPLASTY;  Surgeon: Neil Balls, MD;  Location: [facility] ORS;  Service: Orthopedics;  Laterality: Left;  with block   TUBAL LIGATION  1972    Current Medications: Current Meds  Medication Sig   aspirin  EC 81 MG tablet Take 1 tablet (81 mg total) by mouth daily. Swallow whole.   buPROPion (WELLBUTRIN XL) 150 MG 24 hr tablet Take 150 mg by mouth daily.   ELIQUIS  5 MG TABS tablet TAKE 1 TABLET(5 MG) BY MOUTH TWICE DAILY   empagliflozin  (JARDIANCE ) 10 MG TABS tablet Take 1 tablet (10 mg total) by mouth daily.   esomeprazole  (NEXIUM ) 20 MG capsule Take 1 capsule (20 mg total) by mouth as needed.   furosemide  (LASIX ) 20 MG tablet Take 1 tablet (20 mg total) by mouth every Monday, Wednesday, and Friday.   levothyroxine  (SYNTHROID ) 100 MCG tablet Take 100 mcg by mouth daily before breakfast.   nitroGLYCERIN  (NITROSTAT ) 0.4 MG SL tablet PLACE 1 TABLET UNDER THE TONGUE EVERY 5 MINUTES FOR 3 DOSES AS NEEDED FOR CHEST PAIN   polyethylene glycol (MIRALAX ) 17 g packet Take 17 g by mouth daily. Titrate as needed   sertraline  (ZOLOFT ) 100 MG tablet Take 100 mg by mouth daily.     Allergies:   Penicillins and Sulfa antibiotics   Social History   Socioeconomic History   Marital status: Married    Spouse name: Marylou Sobers   Number of children: 2   Years of education: Not on file   Highest education level: Associate degree: academic program  Occupational History   Occupation: Retired   Occupation: retired  Tobacco Use   Smoking status: Never    Passive exposure: Never   Smokeless tobacco:  Never  Vaping Use   Vaping status: Never Used  Substance and Sexual Activity   Alcohol  use: Not Currently    Comment: rarely wine   Drug use: No   Sexual activity: Not Currently    Partners: Male    Birth control/protection: Post-menopausal    Comment: married  Other Topics Concern   Not on file  Social History Narrative   Lives at home with husband.  Son and daughter.  Right handed    Social Drivers of Health   Financial Resource Strain: Low Risk  (04/11/2022)   Overall Financial Resource Strain (CARDIA)    Difficulty of Paying Living Expenses: Not hard at all  Food Insecurity: No Food Insecurity (05/27/2022)   Hunger Vital Sign    Worried About Running Out of Food in the Last Year:  Never true    Ran Out of Food in the Last Year: Never true  Transportation Needs: No Transportation Needs (05/27/2022)   PRAPARE - Administrator, Civil Service (Medical): No    Lack of Transportation (Non-Medical): No  Physical Activity: Not on file  Stress: Not on file  Social Connections: Not on file     Family History: The patient's family history includes Cancer - Lung in her mother; Heart attack (age of onset: 56) in her father. There is no history of Breast cancer.  ROS:   Please see the history of present illness.     All other systems reviewed and are negative.  EKGs/Labs/Other Studies Reviewed:    The following studies were reviewed today:   EKG:   12/19/2023: Atrial paced, ventricular sensed rhythm.  Right bundle branch block, left anterior fascicular block, Q waves in V1-3, rate 63  Recent Labs: 06/13/2023: Hemoglobin 13.1; Platelets 280; TSH 0.549 09/18/2023: ALT 83; Magnesium  2.2 10/10/2023: BUN 22; Creatinine, Ser 1.11; Potassium 4.9; Sodium 137  Recent Lipid Panel    Component Value Date/Time   CHOL 108 05/06/2022 0959   TRIG 91 05/06/2022 0959   HDL 44 05/06/2022 0959   CHOLHDL 2.5 05/06/2022 0959   CHOLHDL 2.5 04/07/2022 1044   VLDL 21 04/07/2022 1044    LDLCALC 46 05/06/2022 0959    Physical Exam:    VS:  BP 138/68 (BP Location: Left Arm, Patient Position: Sitting, Cuff Size: Normal)   Pulse 63   Ht 5\' 1"  (1.549 m)   Wt 145 lb 3.2 oz (65.9 kg)   SpO2 98%   BMI 27.44 kg/m     Wt Readings from Last 3 Encounters:  12/19/23 145 lb 3.2 oz (65.9 kg)  09/18/23 147 lb 6.4 oz (66.9 kg)  08/29/23 149 lb (67.6 kg)     GEN:  Well nourished, well developed in no acute distress HEENT: Normal NECK: No JVD; No carotid bruits LYMPHATICS: No lymphadenopathy CARDIAC: RRR, no murmurs, rubs, gallops RESPIRATORY:  Clear to auscultation without rales, wheezing or rhonchi  ABDOMEN: Soft, non-tender, non-distended MUSCULOSKELETAL:  No edema; No deformity  SKIN: Warm and dry NEUROLOGIC:  Alert and oriented x 3 PSYCHIATRIC:  Normal affect   ASSESSMENT:    1. Coronary artery disease involving native coronary artery of native heart without angina pectoris   2. Chronic combined systolic and diastolic heart failure (HCC)   3. Hyperlipidemia LDL goal <70   4. Lightheadedness   5. Primary hypertension   6. Paroxysmal atrial fibrillation (HCC)      PLAN:    Lightheadedness: Unclear cause.  Orthostatics at prior clinic visit showed decrease in systolic BP lying to standing but not meeting criteria for orthostatic hypotension.  Lasix  has been changed to 3 days/week.  Referred to neurology for evaluation, brain MRI showed no acute abnormalities  Atrial fibrillation/flutter: Tachycardia-bradycardia syndrome, having A-fib with RVR and long postconversion pauses.  Status post PPM 05/2022, follows with EP -Continue Eliquis  5 mg twice daily -Continue carvedilol  3.125 mg twice daily -Developed transaminitis on amiodarone .  Seen by GI, recommended stopping amiodarone .  Amiodarone  has been discontinued, will monitor for recurrence of A-fib  CAD: Status post anterior STEMI 03/2022, underwent DES to mid LAD and then staged DES to RCA. -Continue Eliquis  5 mg  twice daily, aspirin  81 mg daily -Continue carvedilol  -Continue atorvastatin , dose reduced to 40 mg daily due to transaminitis.  -She is interested in cardiac rehab, will refer  Chronic combined systolic  and diastolic heart failure: Echocardiogram 04/2023 showed EF 30 to 35%, normal RV function, mild mitral regurgitation. -Continue carvedilol  3.125 mg twice daily -Continue Jardiance  10 mg daily -Continue Lasix  as needed.  Appears euvolemic on exam -GDMT has been limited by soft BP.  At last appointment BP appeared improved, discussed adding losartan  25 mg daily but BMET showed elevated potassium (5.3) so losartan  was deferred.  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  Atorvastatin  dose decreased to 40 mg daily due to transaminitis.  Recheck lipid panel   RTC in 4 months    Medication Adjustments/Labs and Tests Ordered: Current medicines are reviewed at length with the patient today.  Concerns regarding medicines are outlined above.  Orders Placed This Encounter  Procedures   Comp Met (CMET)   Magnesium    Lipid panel   AMB referral to cardiac rehabilitation   EKG 12-Lead   EKG 12-Lead     No orders of the defined types were placed in this encounter.    Patient Instructions  Medication Instructions:  CONTINUE CURRENT MEDICATIONS *If you need a refill on your cardiac medications before your next appointment, please call your pharmacy*  Lab Work: MG, Lipid, Bmet today If you have labs (blood work) drawn today and your tests are completely normal, you will receive your results only by: MyChart Message (if you have MyChart) OR A paper copy in the mail If you have any lab test that is abnormal or we need to change your treatment, we will call you to review  the results.  Testing/Procedures: none  Follow-Up: At [facility], you and your health needs are our priority.  As part of our continuing mission to provide you with exceptional heart care, our providers are all part of one team.  This team includes your primary Cardiologist (physician) and Advanced Practice Providers or APPs (Physician Assistants and Nurse Practitioners) who all work together to provide you with the care you need, when you need it.  Your next appointment:   4 month(s)  Provider:   Wendie Hamburg, MD    We recommend signing up for the patient portal called "MyChart".  Sign up information is provided on this After Visit Summary.  MyChart is used to connect with patients for Virtual Visits (Telemedicine).  Patients are able to view lab/test results, encounter notes, upcoming appointments, etc.  Non-urgent messages can be sent to your provider as well.   To learn more about what you can do with MyChart, go to [email or web address].   Other Instructions Referral to Cardiac Outpatient Rehab       Signed, Connie Hamburg, MD  12/19/2023 12:19 PM    [facility]

## 2023-12-19 ENCOUNTER — Encounter: Payer: Self-pay | Admitting: Cardiology

## 2023-12-19 ENCOUNTER — Ambulatory Visit: Payer: Medicare PPO | Attending: Cardiology | Admitting: Cardiology

## 2023-12-19 VITALS — BP 138/68 | HR 63 | Ht 61.0 in | Wt 145.2 lb

## 2023-12-19 DIAGNOSIS — R42 Dizziness and giddiness: Secondary | ICD-10-CM | POA: Diagnosis not present

## 2023-12-19 DIAGNOSIS — I5042 Chronic combined systolic (congestive) and diastolic (congestive) heart failure: Secondary | ICD-10-CM | POA: Diagnosis not present

## 2023-12-19 DIAGNOSIS — I480 Paroxysmal atrial fibrillation: Secondary | ICD-10-CM

## 2023-12-19 DIAGNOSIS — I10 Essential (primary) hypertension: Secondary | ICD-10-CM

## 2023-12-19 DIAGNOSIS — E785 Hyperlipidemia, unspecified: Secondary | ICD-10-CM | POA: Diagnosis not present

## 2023-12-19 DIAGNOSIS — I2510 Atherosclerotic heart disease of native coronary artery without angina pectoris: Secondary | ICD-10-CM

## 2023-12-19 LAB — LIPID PANEL

## 2023-12-19 NOTE — Patient Instructions (Signed)
 Medication Instructions:  CONTINUE CURRENT MEDICATIONS *If you need a refill on your cardiac medications before your next appointment, please call your pharmacy*  Lab Work: MG, Lipid, Bmet today If you have labs (blood work) drawn today and your tests are completely normal, you will receive your results only by: MyChart Message (if you have MyChart) OR A paper copy in the mail If you have any lab test that is abnormal or we need to change your treatment, we will call you to review the results.  Testing/Procedures: none  Follow-Up: At [facility], you and your health needs are our priority.  As part of our continuing mission to provide you with exceptional heart care, our providers are all part of one team.  This team includes your primary Cardiologist (physician) and Advanced Practice Providers or APPs (Physician Assistants and Nurse Practitioners) who all work together to provide you with the care you need, when you need it.  Your next appointment:   4 month(s)  Provider:   Wendie Hamburg, MD    We recommend signing up for the patient portal called "MyChart".  Sign up information is provided on this After Visit Summary.  MyChart is used to connect with patients for Virtual Visits (Telemedicine).  Patients are able to view lab/test results, encounter notes, upcoming appointments, etc.  Non-urgent messages can be sent to your provider as well.   To learn more about what you can do with MyChart, go to [email or web address].   Other Instructions Referral to Cardiac Outpatient Rehab

## 2023-12-20 ENCOUNTER — Ambulatory Visit: Payer: Self-pay | Admitting: Cardiology

## 2023-12-20 LAB — COMPREHENSIVE METABOLIC PANEL WITH GFR
ALT: 27 IU/L (ref 0–32)
AST: 41 IU/L — ABNORMAL HIGH (ref 0–40)
Albumin: 4.2 g/dL (ref 3.7–4.7)
Alkaline Phosphatase: 173 IU/L — ABNORMAL HIGH (ref 44–121)
BUN/Creatinine Ratio: 16 (ref 12–28)
BUN: 16 mg/dL (ref 8–27)
Bilirubin Total: 0.5 mg/dL (ref 0.0–1.2)
CO2: 19 mmol/L — ABNORMAL LOW (ref 20–29)
Calcium: 9.4 mg/dL (ref 8.7–10.3)
Chloride: 106 mmol/L (ref 96–106)
Creatinine, Ser: 1.01 mg/dL — ABNORMAL HIGH (ref 0.57–1.00)
Globulin, Total: 2 g/dL (ref 1.5–4.5)
Glucose: 81 mg/dL (ref 70–99)
Potassium: 5.2 mmol/L (ref 3.5–5.2)
Sodium: 142 mmol/L (ref 134–144)
Total Protein: 6.2 g/dL (ref 6.0–8.5)
eGFR: 55 mL/min/{1.73_m2} — ABNORMAL LOW (ref >59)

## 2023-12-20 LAB — LIPID PANEL
Cholesterol, Total: 129 mg/dL (ref 100–199)
HDL: 55 mg/dL (ref >39)
LDL CALC COMMENT:: 2.3 ratio (ref 0.0–4.4)
LDL Chol Calc (NIH): 59 mg/dL (ref 0–99)
Triglycerides: 75 mg/dL (ref 0–149)
VLDL Cholesterol Cal: 15 mg/dL (ref 5–40)

## 2023-12-20 LAB — MAGNESIUM: Magnesium: 2.2 mg/dL (ref 1.6–2.3)

## 2023-12-27 ENCOUNTER — Telehealth (HOSPITAL_COMMUNITY): Payer: Self-pay

## 2023-12-27 NOTE — Telephone Encounter (Signed)
 Attempted to call patient in regards to Cardiac Rehab - LM on VM

## 2023-12-27 NOTE — Telephone Encounter (Signed)
 Pt insurance is active and benefits verified through [facility]. Co-pay $20.00, DED $0.00/$0.00 met, out of pocket $4,000.00/$666.75 met, co-insurance 0%. No pre-authorization required. Passport, 12/27/23 @ 4:10PM, REF#[id number]   How many CR sessions are covered? (36 visits for TCR, 72 visits for ICR)72 Is this a lifetime maximum or an annual maximum? Annual Has the member used any of these services to date? No Is there a time limit (weeks/months) on start of program and/or program completion? No     Will contact patient to see if she is interested in the Cardiac Rehab Program.

## 2024-01-03 ENCOUNTER — Encounter (HOSPITAL_COMMUNITY): Payer: Self-pay

## 2024-01-03 ENCOUNTER — Telehealth: Payer: Self-pay | Admitting: Pharmacist

## 2024-01-03 NOTE — Progress Notes (Signed)
   01/03/2024  Patient ID: Connie [name], female   DOB: 1941/07/12, 83 y.o.   MRN: [id number]  Patient appeared on insurance report for at-risk for failing 2025 metric: Medication Adherence for Hypertension [facility])    Medication: Losartan  25mg  Last fill date: 07/30/23  Medication discontinued due to low BP + elevated potassium levels. Will FAIL metric for 2025.    Delvin File, PharmD [facility]  Phone Number: [phone]

## 2024-01-04 ENCOUNTER — Other Ambulatory Visit (HOSPITAL_BASED_OUTPATIENT_CLINIC_OR_DEPARTMENT_OTHER): Payer: Self-pay | Admitting: Family Medicine

## 2024-01-04 DIAGNOSIS — M8588 Other specified disorders of bone density and structure, other site: Secondary | ICD-10-CM

## 2024-01-07 NOTE — Progress Notes (Unsigned)
  Electrophysiology Office Note:    Date:  01/08/2024   ID:  Connie, [name] Dec 16, 1940, MRN [id number]  PCP:  Ransom Byers, MD   [facility] Providers Cardiologist:  Wendie Hamburg, MD Electrophysiologist:  Efraim Grange, MD     Referring MD: Ransom Byers, *   History of Present Illness:    Connie [name] is a 83 y.o. 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  She was admitted with an anterior STEMI and atrial fibrillation in September, 2023. She was managed with amiodarone . She was converted to sinus and discharged but returned about a month later with symptoms of dizziness. Heart monitor worn in the interim showed episodes of atrial fibrillation with post-conversion pauses.  she has no device related complaints -- no new tenderness, drainage, redness.     EKGs/Labs/Other Studies Reviewed Today:     EKG:   EKG Interpretation Date/Time:  Monday January 08 2024 10:46:14 EDT Ventricular Rate:  79 PR Interval:  186 QRS Duration:  124 QT Interval:  422 QTC Calculation: 483 R Axis:   -69  Text Interpretation: Atrial-paced rhythm Left axis deviation Right bundle branch block Septal infarct (cited on or before 19-Dec-2023) Possible Lateral infarct (cited on or before 19-Dec-2023) When compared with ECG of 19-Dec-2023 10:04, No significant change was found Confirmed by Marlane Silver [phone]) on 01/08/2024 11:21:07 AM     Recent Labs: 06/13/2023: Hemoglobin 13.1; Platelets 280; TSH 0.549 12/19/2023: ALT 27; BUN 16; Creatinine, Ser 1.01; Magnesium  2.2; Potassium 5.2; Sodium 142     Physical Exam:    VS:  BP 126/68   Pulse 79   Ht 5' 1 (1.549 m)   Wt 143 lb 12.8 oz (65.2 kg)   SpO2 97%   BMI 27.17 kg/m     Wt Readings from Last 3 Encounters:  01/08/24 143 lb 12.8 oz (65.2 kg)  12/19/23 145 lb 3.2 oz (65.9 kg)   09/18/23 147 lb 6.4 oz (66.9 kg)     GEN: Well nourished, well developed in no acute distress CARDIAC: RRR, no murmurs, rubs, gallops RESPIRATORY:  Normal work of breathing MUSCULOSKELETAL: no edema    ASSESSMENT & PLAN:    Sinus bradycardia Medtronic dual chamber pacemaker in place Atrial fibrillation:  Low burden. Continue amiodarone  for now. Labs 11/2023 and 05/2023 reviewed  Secondary hypercoagulable state: continue apixaban  5mg  PO BID      Medication Adjustments/Labs and Tests Ordered: Current medicines are reviewed at length with the patient today.  Concerns regarding medicines are outlined above.  Orders Placed This Encounter  Procedures   EKG 12-Lead   No orders of the defined types were placed in this encounter.    Signed, Efraim Grange, MD  01/08/2024 11:21 AM     [facility]

## 2024-01-08 ENCOUNTER — Ambulatory Visit: Attending: Cardiovascular Disease | Admitting: Cardiovascular Disease

## 2024-01-08 ENCOUNTER — Encounter: Payer: Self-pay | Admitting: Cardiovascular Disease

## 2024-01-08 VITALS — BP 126/68 | HR 79 | Ht 61.0 in | Wt 143.8 lb

## 2024-01-08 DIAGNOSIS — I2510 Atherosclerotic heart disease of native coronary artery without angina pectoris: Secondary | ICD-10-CM | POA: Diagnosis not present

## 2024-01-08 DIAGNOSIS — I480 Paroxysmal atrial fibrillation: Secondary | ICD-10-CM | POA: Diagnosis not present

## 2024-01-08 DIAGNOSIS — E785 Hyperlipidemia, unspecified: Secondary | ICD-10-CM

## 2024-01-08 DIAGNOSIS — I495 Sick sinus syndrome: Secondary | ICD-10-CM | POA: Diagnosis not present

## 2024-01-08 LAB — CUP PACEART INCLINIC DEVICE CHECK
Date Time Interrogation Session: 20250616113113
Implantable Lead Connection Status: 753985
Implantable Lead Connection Status: 753985
Implantable Lead Implant Date: 20231103
Implantable Lead Implant Date: 20231103
Implantable Lead Location: 753859
Implantable Lead Location: 753860
Implantable Lead Model: 3830
Implantable Lead Model: 5076
Implantable Pulse Generator Implant Date: 20231103

## 2024-01-08 NOTE — Patient Instructions (Signed)
 Medication Instructions:  Your physician recommends that you continue on your current medications as directed. Please refer to the Current Medication list given to you today. *If you need a refill on your cardiac medications before your next appointment, please call your pharmacy*   Follow-Up: At [facility], you and your health needs are our priority.  As part of our continuing mission to provide you with exceptional heart care, our providers are all part of one team.  This team includes your primary Cardiologist (physician) and Advanced Practice Providers or APPs (Physician Assistants and Nurse Practitioners) who all work together to provide you with the care you need, when you need it.  Your next appointment:   6 month(s)  Provider:   You will follow up in the Atrial Fibrillation Clinic located at [facility]. Your provider will be: Clint R. Fenton, PA-C or Minnie Amber, PA-C

## 2024-01-10 ENCOUNTER — Telehealth (HOSPITAL_COMMUNITY): Payer: Self-pay

## 2024-01-10 NOTE — Telephone Encounter (Signed)
 Called and confirmed Cardiac Rehab appt for tomorrow at 9:30am. Completed CR Nursing Assessment Questionnaire. Directions and information provided.

## 2024-01-11 ENCOUNTER — Encounter (HOSPITAL_COMMUNITY)
Admission: RE | Admit: 2024-01-11 | Discharge: 2024-01-11 | Disposition: A | Discharge status: Home / Self Care | Source: Ambulatory Visit | Attending: Cardiology | Admitting: Cardiology

## 2024-01-11 VITALS — BP 140/66 | HR 63 | Ht 61.75 in | Wt 144.6 lb

## 2024-01-11 DIAGNOSIS — I5042 Chronic combined systolic (congestive) and diastolic (congestive) heart failure: Secondary | ICD-10-CM | POA: Diagnosis not present

## 2024-01-11 NOTE — Progress Notes (Signed)
 Cardiac Rehab Medication Review   Does the patient  feel that his/her medications are working for him/her?  yes  Has the patient been experiencing any side effects to the medications prescribed?  no  Does the patient measure his/her own blood pressure or blood glucose at home?  yes   Does the patient have any problems obtaining medications due to transportation or finances?   no  Understanding of regimen: good Understanding of indications: good Potential of compliance: good    Comments: Pt checks BP 1-2 x/day. Spouse is responsible for medications, no questions about them.     Rosezena Contes 01/11/2024 9:40 AM

## 2024-01-11 NOTE — Progress Notes (Signed)
 Cardiac Individual Treatment Plan  Patient Details  Name: Connie [name] MRN: [id number] Date of Birth: 30-Oct-1940 Referring Provider:   Flowsheet Row INTENSIVE CARDIAC REHAB ORIENT from 01/11/2024 in [facility]  Referring Provider Wendie Hamburg, MD    Initial Encounter Date:  Flowsheet Row INTENSIVE CARDIAC REHAB ORIENT from 01/11/2024 in [facility]  Date 01/11/24    Visit Diagnosis: Heart failure, systolic and diastolic, chronic (HCC)  Patient's Home Medications on Admission:  Current Outpatient Medications:    aspirin  EC 81 MG tablet, Take 1 tablet (81 mg total) by mouth daily. Swallow whole., Disp: , Rfl:    atorvastatin  (LIPITOR ) 40 MG tablet, Take 1 tablet (40 mg total) by mouth daily., Disp: 90 tablet, Rfl: 3   buPROPion (WELLBUTRIN XL) 150 MG 24 hr tablet, Take 150 mg by mouth daily., Disp: , Rfl:    carvedilol  (COREG ) 3.125 MG tablet, Take 1 tablet (3.125 mg total) by mouth 2 (two) times daily., Disp: 180 tablet, Rfl: 3   ELIQUIS  5 MG TABS tablet, TAKE 1 TABLET(5 MG) BY MOUTH TWICE DAILY, Disp: 60 tablet, Rfl: 5   empagliflozin  (JARDIANCE ) 10 MG TABS tablet, Take 1 tablet (10 mg total) by mouth daily., Disp: 90 tablet, Rfl: 3   esomeprazole  (NEXIUM ) 20 MG capsule, Take 1 capsule (20 mg total) by mouth as needed., Disp: , Rfl:    furosemide  (LASIX ) 20 MG tablet, Take 1 tablet (20 mg total) by mouth every Monday, Wednesday, and Friday., Disp: 36 tablet, Rfl: 3   levothyroxine  (SYNTHROID ) 100 MCG tablet, Take 100 mcg by mouth daily before breakfast., Disp: , Rfl:    nitroGLYCERIN  (NITROSTAT ) 0.4 MG SL tablet, PLACE 1 TABLET UNDER THE TONGUE EVERY 5 MINUTES FOR 3 DOSES AS NEEDED FOR CHEST PAIN, Disp: 25 tablet, Rfl: 10   polyethylene glycol (MIRALAX ) 17 g packet, Take 17 g by mouth daily. 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   Multiple, noted on US    History of hiatal hernia    Hypertension    Hypothyroidism    Macular degeneration    Psoriatic arthritis (HCC)    Sinus drainage    UTI (urinary tract infection)     Tobacco Use: Social History   Tobacco Use  Smoking Status Never   Passive exposure: Never  Smokeless Tobacco Never    Labs: Review Flowsheet       Latest Ref Rng & Units 07/27/2021 04/07/2022 05/06/2022 12/19/2023  Labs for ITP Cardiac and Pulmonary Rehab  Cholestrol 100 - 199 mg/dL 409  811  914  782   LDL (calc) 0 - 99 mg/dL 75  54  46  59   HDL-C >39 mg/dL 50  50  44  55   Trlycerides 0 - 149 mg/dL 956  213  91  75   Hemoglobin A1c 4.8 - 5.6 % - 5.6  - -  TCO2 22 - 32 mmol/L - 23  - -    Capillary Blood Glucose: Lab Results  Component Value Date   GLUCAP 165 (H) 04/08/2022  GLUCAP 137 (H) 04/08/2022     Exercise Target Goals: Exercise Program Goal: Individual exercise prescription set using results from initial 6 min walk test and THRR while considering  patient's activity barriers and safety.   Exercise Prescription Goal: Initial exercise prescription builds to 30-45 minutes a day of aerobic activity, 2-3 days per week.  Home exercise guidelines will be given to patient during program as part of exercise prescription that the participant will acknowledge.  Activity Barriers & Risk Stratification:  Activity Barriers & Cardiac Risk Stratification - 01/11/24 1016       Activity Barriers & Cardiac Risk Stratification   Activity Barriers Left Knee Replacement;Right Knee Replacement;Balance Concerns;Assistive Device;Arthritis;Joint Problems;Other (comment);Muscular Weakness;Deconditioning;Decreased Ventricular Function     Comments Psoriatic arthritis causes joint pain, left total shoulder replacement, macular degeneration, hx back surgery for herniated disc in 2000, recently broke foot    Cardiac Risk Stratification High          6 Minute Walk:  6 Minute Walk     Row Name 01/11/24 1414         6 Minute Walk   Phase Initial  Used Rollator     Distance 925 feet     Walk Time 6 minutes     # of Rest Breaks 0     MPH 1.75     METS 1.7     RPE 11     Perceived Dyspnea  0     VO2 Peak 5.84     Symptoms No     Resting HR 61 bpm     Resting BP 140/66     Resting Oxygen Saturation  97 %     Exercise Oxygen Saturation  during 6 min walk 97 %     Max Ex. HR 111 bpm     Max Ex. BP 148/76     2 Minute Post BP 128/70        Oxygen Initial Assessment:   Oxygen Re-Evaluation:   Oxygen Discharge (Final Oxygen Re-Evaluation):   Initial Exercise Prescription:  Initial Exercise Prescription - 01/11/24 1000       Date of Initial Exercise RX and Referring Provider   Date 01/11/24    Referring Provider Wendie Hamburg, MD    Expected Discharge Date 04/03/24      NuStep   Level 1    SPM 70    Minutes 30    METs 1.7      Prescription Details   Frequency (times per week) 2    Duration Progress to 30 minutes of continuous aerobic without signs/symptoms of physical distress      Intensity   THRR 40-80% of Max Heartrate 54-109    Ratings of Perceived Exertion 11-13    Perceived Dyspnea 0-4      Progression   Progression Continue progressive overload as per policy without signs/symptoms or physical distress.      Resistance Training   Training Prescription Yes    Weight 2 lbs    Reps 10-15          Perform Capillary Blood Glucose checks as needed.  Exercise Prescription Changes:   Exercise Comments:   Exercise Goals and Review:   Exercise Goals     Row Name 01/11/24 1015             Exercise Goals   Increase Physical Activity Yes       Intervention Provide  advice, education, support and counseling about physical activity/exercise needs.;Develop an  individualized exercise prescription for aerobic and resistive training based on initial evaluation findings, risk stratification, comorbidities and participant's personal goals.       Expected Outcomes Short Term: Attend rehab on a regular basis to increase amount of physical activity.;Long Term: Add in home exercise to make exercise part of routine and to increase amount of physical activity.;Long Term: Exercising regularly at least 3-5 days a week.       Increase Strength and Stamina Yes       Intervention Provide advice, education, support and counseling about physical activity/exercise needs.;Develop an individualized exercise prescription for aerobic and resistive training based on initial evaluation findings, risk stratification, comorbidities and participant's personal goals.       Expected Outcomes Long Term: Improve cardiorespiratory fitness, muscular endurance and strength as measured by increased METs and functional capacity ( );Short Term: Perform resistance training exercises routinely during rehab and add in resistance training at home;Short Term: Increase workloads from initial exercise prescription for resistance, speed, and METs.       Able to understand and use rate of perceived exertion (RPE) scale Yes       Intervention Provide education and explanation on how to use RPE scale       Expected Outcomes Short Term: Able to use RPE daily in rehab to express subjective intensity level;Long Term:  Able to use RPE to guide intensity level when exercising independently       Knowledge and understanding of Target Heart Rate Range (THRR) Yes       Intervention Provide education and explanation of THRR including how the numbers were predicted and where they are located for reference       Expected Outcomes Short Term: Able to state/look up THRR;Long Term: Able to use THRR to govern intensity when  exercising independently;Short Term: Able to use daily as guideline for intensity in rehab       Able to check pulse independently Yes       Intervention Provide education and demonstration on how to check pulse in carotid and radial arteries.;Review the importance of being able to check your own pulse for safety during independent exercise       Expected Outcomes Long Term: Able to check pulse independently and accurately;Short Term: Able to explain why pulse checking is important during independent exercise       Understanding of Exercise Prescription Yes       Intervention Provide education, explanation, and written materials on patient's individual exercise prescription       Expected Outcomes Short Term: Able to explain program exercise prescription;Long Term: Able to explain home exercise prescription to exercise independently          Exercise Goals Re-Evaluation :   Discharge Exercise Prescription (Final Exercise Prescription Changes):   Nutrition:  Target Goals: Understanding of nutrition guidelines, daily intake of sodium 1500mg , cholesterol 200mg , calories 30% from fat and 7% or less from saturated fats, daily to have 5 or more servings of fruits and vegetables.  Biometrics:  Pre Biometrics - 01/11/24 0948       Pre Biometrics   Waist Circumference 37.5 inches    Hip Circumference 41 inches    Waist to Hip Ratio 0.91 %    Triceps Skinfold 19 mm    % Body Fat 38.9 %    Grip Strength 14 kg    Flexibility 0 in   could not reach   Single Leg Stand 1 seconds  Nutrition Therapy Plan and Nutrition Goals:   Nutrition Assessments:  MEDIFICTS Score Key: >=70 Need to make dietary changes  40-70 Heart Healthy Diet <= 40 Therapeutic Level Cholesterol Diet   Flowsheet Row INTENSIVE CARDIAC REHAB ORIENT from 08/23/2022 in [facility]  Picture Your Plate Total Score on Admission 78   Picture Your Plate  Scores: <16 Unhealthy dietary pattern with much room for improvement. 41-50 Dietary pattern unlikely to meet recommendations for good health and room for improvement. 51-60 More healthful dietary pattern, with some room for improvement.  >60 Healthy dietary pattern, although there may be some specific behaviors that could be improved.    Nutrition Goals Re-Evaluation:   Nutrition Goals Re-Evaluation:   Nutrition Goals Discharge (Final Nutrition Goals Re-Evaluation):   Psychosocial: Target Goals: Acknowledge presence or absence of significant depression and/or stress, maximize coping skills, provide positive support system. Participant is able to verbalize types and ability to use techniques and skills needed for reducing stress and depression.  Initial Review & Psychosocial Screening:  Initial Psych Review & Screening - 01/11/24 1002       Initial Review   Current issues with Current Depression;History of Depression;Current Psychotropic Meds;Current Stress Concerns    Comments Pt is losing her vison, spouse has to read to her. Depends on him for transportation, medications, etc.      Family Dynamics   Good Support System? Yes   Pt has sposue     Barriers   Psychosocial barriers to participate in program The patient should benefit from training in stress management and relaxation.      Screening Interventions   Interventions Encouraged to exercise;To provide support and resources with identified psychosocial needs;Provide feedback about the scores to participant    Expected Outcomes Long Term Goal: Stressors or current issues are controlled or eliminated.;Long Term goal: The participant improves quality of Life and PHQ9 Scores as seen by post scores and/or verbalization of changes;Short Term goal: Identification and review with participant of any Quality of Life or Depression concerns found by scoring the questionnaire.          Quality of Life Scores:  Quality of Life -  01/11/24 1428       Quality of Life   Select Quality of Life      Quality of Life Scores   Health/Function Pre 23.04 %    Socioeconomic Pre 24.67 %    Psych/Spiritual Pre 25.07 %    Family Pre 30 %    GLOBAL Pre 24.71 %         Scores of 19 and below usually indicate a poorer quality of life in these areas.  A difference of  2-3 points is a clinically meaningful difference.  A difference of 2-3 points in the total score of the Quality of Life Index has been associated with significant improvement in overall quality of life, self-image, physical symptoms, and general health in studies assessing change in quality of life.  PHQ-9: Review Flowsheet       01/11/2024 08/23/2022  Depression screen PHQ 2/9  Decreased Interest 0 1  Down, Depressed, Hopeless 1 1  PHQ - 2 Score 1 2  Altered sleeping 0 0  Tired, decreased energy 1 1  Change in appetite 0 0  Feeling bad or failure about yourself  1 -  Trouble concentrating 0 1  Moving slowly or fidgety/restless 0 0  Suicidal thoughts 0 0  PHQ-9 Score 3 4  Difficult doing work/chores Somewhat difficult Somewhat difficult   Interpretation of Total Score  Total Score Depression Severity:  1-4 = Minimal depression, 5-9 = Mild depression, 10-14 = Moderate depression, 15-19 = Moderately severe depression, 20-27 = Severe depression   Psychosocial Evaluation and Intervention:   Psychosocial Re-Evaluation:   Psychosocial Discharge (Final Psychosocial Re-Evaluation):   Vocational Rehabilitation: Provide vocational rehab assistance to qualifying candidates.   Vocational Rehab Evaluation & Intervention:  Vocational Rehab - 01/11/24 1014       Initial Vocational Rehab Evaluation & Intervention   Assessment shows need for Vocational Rehabilitation No   Pt is retired         Education: Education Goals: Education classes will be provided on a weekly basis, covering required topics. Participant will state understanding/return  demonstration of topics presented.     Core Videos: Exercise    Move It!  Clinical staff conducted group or individual video education with verbal and written material and guidebook.  Patient learns the recommended Pritikin exercise program. Exercise with the goal of living a long, healthy life. Some of the health benefits of exercise include controlled diabetes, healthier blood pressure levels, improved cholesterol levels, improved heart and lung capacity, improved sleep, and better body composition. Everyone should speak with their doctor before starting or changing an exercise routine.  Biomechanical Limitations Clinical staff conducted group or individual video education with verbal and written material and guidebook.  Patient learns how biomechanical limitations can impact exercise and how we can mitigate and possibly overcome limitations to have an impactful and balanced exercise routine.  Body Composition Clinical staff conducted group or individual video education with verbal and written material and guidebook.  Patient learns that body composition (ratio of muscle mass to fat mass) is a key component to assessing overall fitness, rather than body weight alone. Increased fat mass, especially visceral belly fat, can put us  at increased risk for metabolic syndrome, type 2 diabetes, heart disease, and even death. It is recommended to combine diet and exercise (cardiovascular and resistance training) to improve your body composition. Seek guidance from your physician and exercise physiologist before implementing an exercise routine.  Exercise Action Plan Clinical staff conducted group or individual video education with verbal and written material and guidebook.  Patient learns the recommended strategies to achieve and enjoy long-term exercise adherence, including variety, self-motivation, self-efficacy, and positive decision making. Benefits of exercise include fitness, good health, weight  management, more energy, better sleep, less stress, and overall well-being.  Medical   Heart Disease Risk Reduction Clinical staff conducted group or individual video education with verbal and written material and guidebook.  Patient learns our heart is our most vital organ as it circulates oxygen, nutrients, white blood cells, and hormones throughout the entire body, and carries waste away. Data supports a plant-based eating plan like the Pritikin Program for its effectiveness in slowing progression of and reversing heart disease. The video provides a number of recommendations to address heart disease.   Metabolic Syndrome and Belly Fat  Clinical staff conducted group or individual video education with verbal and written material and guidebook.  Patient learns what metabolic syndrome is, how it leads to heart disease, and how one can reverse it and keep it from coming back. You have metabolic syndrome if you have 3 of the following 5 criteria: abdominal obesity, high blood pressure, high triglycerides, low HDL cholesterol, and high blood sugar.  Hypertension and Heart Disease Clinical staff conducted group or individual video education with verbal  and written material and guidebook.  Patient learns that high blood pressure, or hypertension, is very common in the [place] . Hypertension is largely due to excessive salt intake, but other important risk factors include being overweight, physical inactivity, drinking too much alcohol , smoking, and not eating enough potassium from fruits and vegetables. High blood pressure is a leading risk factor for heart attack, stroke, congestive heart failure, dementia, kidney failure, and premature death. Long-term effects of excessive salt intake include stiffening of the arteries and thickening of heart muscle and organ damage. Recommendations include ways to reduce hypertension and the risk of heart disease.  Diseases of Our Time - Focusing on  Diabetes Clinical staff conducted group or individual video education with verbal and written material and guidebook.  Patient learns why the best way to stop diseases of our time is prevention, through food and other lifestyle changes. Medicine (such as prescription pills and surgeries) is often only a Band-Aid on the problem, not a long-term solution. Most common diseases of our time include obesity, type 2 diabetes, hypertension, heart disease, and cancer. The Pritikin Program is recommended and has been proven to help reduce, reverse, and/or prevent the damaging effects of metabolic syndrome.  Nutrition   Overview of the Pritikin Eating Plan  Clinical staff conducted group or individual video education with verbal and written material and guidebook.  Patient learns about the Pritikin Eating Plan for disease risk reduction. The Pritikin Eating Plan emphasizes a wide variety of unrefined, minimally-processed carbohydrates, like fruits, vegetables, whole grains, and legumes. Go, Caution, and Stop food choices are explained. Plant-based and lean animal proteins are emphasized. Rationale provided for low sodium intake for blood pressure control, low added sugars for blood sugar stabilization, and low added fats and oils for coronary artery disease risk reduction and weight management.  Calorie Density  Clinical staff conducted group or individual video education with verbal and written material and guidebook.  Patient learns about calorie density and how it impacts the Pritikin Eating Plan. Knowing the characteristics of the food you choose will help you decide whether those foods will lead to weight gain or weight loss, and whether you want to consume more or less of them. Weight loss is usually a side effect of the Pritikin Eating Plan because of its focus on low calorie-dense foods.  Label Reading  Clinical staff conducted group or individual video education with verbal and written material and  guidebook.  Patient learns about the Pritikin recommended label reading guidelines and corresponding recommendations regarding calorie density, added sugars, sodium content, and whole grains.  Dining Out - Part 1  Clinical staff conducted group or individual video education with verbal and written material and guidebook.  Patient learns that restaurant meals can be sabotaging because they can be so high in calories, fat, sodium, and/or sugar. Patient learns recommended strategies on how to positively address this and avoid unhealthy pitfalls.  Facts on Fats  Clinical staff conducted group or individual video education with verbal and written material and guidebook.  Patient learns that lifestyle modifications can be just as effective, if not more so, as many medications for lowering your risk of heart disease. A Pritikin lifestyle can help to reduce your risk of inflammation and atherosclerosis (cholesterol build-up, or plaque, in the artery walls). Lifestyle interventions such as dietary choices and physical activity address the cause of atherosclerosis. A review of the types of fats and their impact on blood cholesterol levels, along with dietary recommendations to reduce fat intake  is also included.  Nutrition Action Plan  Clinical staff conducted group or individual video education with verbal and written material and guidebook.  Patient learns how to incorporate Pritikin recommendations into their lifestyle. Recommendations include planning and keeping personal health goals in mind as an important part of their success.  Healthy Mind-Set    Healthy Minds, Bodies, Hearts  Clinical staff conducted group or individual video education with verbal and written material and guidebook.  Patient learns how to identify when they are stressed. Video will discuss the impact of that stress, as well as the many benefits of stress management. Patient will also be introduced to stress management techniques.  The way we think, act, and feel has an impact on our hearts.  How Our Thoughts Can Heal Our Hearts  Clinical staff conducted group or individual video education with verbal and written material and guidebook.  Patient learns that negative thoughts can cause depression and anxiety. This can result in negative lifestyle behavior and serious health problems. Cognitive behavioral therapy is an effective method to help control our thoughts in order to change and improve our emotional outlook.  Additional Videos:  Exercise    Improving Performance  Clinical staff conducted group or individual video education with verbal and written material and guidebook.  Patient learns to use a non-linear approach by alternating intensity levels and lengths of time spent exercising to help burn more calories and lose more body fat. Cardiovascular exercise helps improve heart health, metabolism, hormonal balance, blood sugar control, and recovery from fatigue. Resistance training improves strength, endurance, balance, coordination, reaction time, metabolism, and muscle mass. Flexibility exercise improves circulation, posture, and balance. Seek guidance from your physician and exercise physiologist before implementing an exercise routine and learn your capabilities and proper form for all exercise.  Introduction to Yoga  Clinical staff conducted group or individual video education with verbal and written material and guidebook.  Patient learns about yoga, a discipline of the coming together of mind, breath, and body. The benefits of yoga include improved flexibility, improved range of motion, better posture and core strength, increased lung function, weight loss, and positive self-image. Yoga's heart health benefits include lowered blood pressure, healthier heart rate, decreased cholesterol and triglyceride levels, improved immune function, and reduced stress. Seek guidance from your physician and exercise physiologist  before implementing an exercise routine and learn your capabilities and proper form for all exercise.  Medical   Aging: Enhancing Your Quality of Life  Clinical staff conducted group or individual video education with verbal and written material and guidebook.  Patient learns key strategies and recommendations to stay in good physical health and enhance quality of life, such as prevention strategies, having an advocate, securing a Health Care Proxy and Power of Attorney, and keeping a list of medications and system for tracking them. It also discusses how to avoid risk for bone loss.  Biology of Weight Control  Clinical staff conducted group or individual video education with verbal and written material and guidebook.  Patient learns that weight gain occurs because we consume more calories than we burn (eating more, moving less). Even if your body weight is normal, you may have higher ratios of fat compared to muscle mass. Too much body fat puts you at increased risk for cardiovascular disease, heart attack, stroke, type 2 diabetes, and obesity-related cancers. In addition to exercise, following the Pritikin Eating Plan can help reduce your risk.  Decoding Lab Results  Clinical staff conducted group or individual video education  with verbal and written material and guidebook.  Patient learns that lab test reflects one measurement whose values change over time and are influenced by many factors, including medication, stress, sleep, exercise, food, hydration, pre-existing medical conditions, and more. It is recommended to use the knowledge from this video to become more involved with your lab results and evaluate your numbers to speak with your doctor.   Diseases of Our Time - Overview  Clinical staff conducted group or individual video education with verbal and written material and guidebook.  Patient learns that according to the CDC, 50% to 70% of chronic diseases (such as obesity, type 2 diabetes,  elevated lipids, hypertension, and heart disease) are avoidable through lifestyle improvements including healthier food choices, listening to satiety cues, and increased physical activity.  Sleep Disorders Clinical staff conducted group or individual video education with verbal and written material and guidebook.  Patient learns how good quality and duration of sleep are important to overall health and well-being. Patient also learns about sleep disorders and how they impact health along with recommendations to address them, including discussing with a physician.  Nutrition  Dining Out - Part 2 Clinical staff conducted group or individual video education with verbal and written material and guidebook.  Patient learns how to plan ahead and communicate in order to maximize their dining experience in a healthy and nutritious manner. Included are recommended food choices based on the type of restaurant the patient is visiting.   Fueling a Banker conducted group or individual video education with verbal and written material and guidebook.  There is a strong connection between our food choices and our health. Diseases like obesity and type 2 diabetes are very prevalent and are in large-part due to lifestyle choices. The Pritikin Eating Plan provides plenty of food and hunger-curbing satisfaction. It is easy to follow, affordable, and helps reduce health risks.  Menu Workshop  Clinical staff conducted group or individual video education with verbal and written material and guidebook.  Patient learns that restaurant meals can sabotage health goals because they are often packed with calories, fat, sodium, and sugar. Recommendations include strategies to plan ahead and to communicate with the manager, chef, or server to help order a healthier meal.  Planning Your Eating Strategy  Clinical staff conducted group or individual video education with verbal and written material and  guidebook.  Patient learns about the Pritikin Eating Plan and its benefit of reducing the risk of disease. The Pritikin Eating Plan does not focus on calories. Instead, it emphasizes high-quality, nutrient-rich foods. By knowing the characteristics of the foods, we choose, we can determine their calorie density and make informed decisions.  Targeting Your Nutrition Priorities  Clinical staff conducted group or individual video education with verbal and written material and guidebook.  Patient learns that lifestyle habits have a tremendous impact on disease risk and progression. This video provides eating and physical activity recommendations based on your personal health goals, such as reducing LDL cholesterol, losing weight, preventing or controlling type 2 diabetes, and reducing high blood pressure.  Vitamins and Minerals  Clinical staff conducted group or individual video education with verbal and written material and guidebook.  Patient learns different ways to obtain key vitamins and minerals, including through a recommended healthy diet. It is important to discuss all supplements you take with your doctor.   Healthy Mind-Set    Smoking Cessation  Clinical staff conducted group or individual video education with verbal and written material  and guidebook.  Patient learns that cigarette smoking and tobacco addiction pose a serious health risk which affects millions of people. Stopping smoking will significantly reduce the risk of heart disease, lung disease, and many forms of cancer. Recommended strategies for quitting are covered, including working with your doctor to develop a successful plan.  Culinary   Becoming a Set designer conducted group or individual video education with verbal and written material and guidebook.  Patient learns that cooking at home can be healthy, cost-effective, quick, and puts them in control. Keys to cooking healthy recipes will include looking  at your recipe, assessing your equipment needs, planning ahead, making it simple, choosing cost-effective seasonal ingredients, and limiting the use of added fats, salts, and sugars.  Cooking - Breakfast and Snacks  Clinical staff conducted group or individual video education with verbal and written material and guidebook.  Patient learns how important breakfast is to satiety and nutrition through the entire day. Recommendations include key foods to eat during breakfast to help stabilize blood sugar levels and to prevent overeating at meals later in the day. Planning ahead is also a key component.  Cooking - Educational psychologist conducted group or individual video education with verbal and written material and guidebook.  Patient learns eating strategies to improve overall health, including an approach to cook more at home. Recommendations include thinking of animal protein as a side on your plate rather than center stage and focusing instead on lower calorie dense options like vegetables, fruits, whole grains, and plant-based proteins, such as beans. Making sauces in large quantities to freeze for later and leaving the skin on your vegetables are also recommended to maximize your experience.  Cooking - Healthy Salads and Dressing Clinical staff conducted group or individual video education with verbal and written material and guidebook.  Patient learns that vegetables, fruits, whole grains, and legumes are the foundations of the Pritikin Eating Plan. Recommendations include how to incorporate each of these in flavorful and healthy salads, and how to create homemade salad dressings. Proper handling of ingredients is also covered. Cooking - Soups and [employer] - Soups and Desserts Clinical staff conducted group or individual video education with verbal and written material and guidebook.  Patient learns that Pritikin soups and desserts make for easy, nutritious, and delicious snacks  and meal components that are low in sodium, fat, sugar, and calorie density, while high in vitamins, minerals, and filling fiber. Recommendations include simple and healthy ideas for soups and desserts.   Overview     The Pritikin Solution Program Overview Clinical staff conducted group or individual video education with verbal and written material and guidebook.  Patient learns that the results of the Pritikin Program have been documented in more than 100 articles published in peer-reviewed journals, and the benefits include reducing risk factors for (and, in some cases, even reversing) high cholesterol, high blood pressure, type 2 diabetes, obesity, and more! An overview of the three key pillars of the Pritikin Program will be covered: eating well, doing regular exercise, and having a healthy mind-set.  WORKSHOPS  Exercise: Exercise Basics: Building Your Action Plan Clinical staff led group instruction and group discussion with PowerPoint presentation and patient guidebook. To enhance the learning environment the use of posters, models and videos may be added. At the conclusion of this workshop, patients will comprehend the difference between physical activity and exercise, as well as the benefits of incorporating both, into their routine. Patients  will understand the FITT (Frequency, Intensity, Time, and Type) principle and how to use it to build an exercise action plan. In addition, safety concerns and other considerations for exercise and cardiac rehab will be addressed by the presenter. The purpose of this lesson is to promote a comprehensive and effective weekly exercise routine in order to improve patients' overall level of fitness.   Managing Heart Disease: Your Path to a Healthier Heart Clinical staff led group instruction and group discussion with PowerPoint presentation and patient guidebook. To enhance the learning environment the use of posters, models and videos may be added.At the  conclusion of this workshop, patients will understand the anatomy and physiology of the heart. Additionally, they will understand how Pritikin's three pillars impact the risk factors, the progression, and the management of heart disease.  The purpose of this lesson is to provide a high-level overview of the heart, heart disease, and how the Pritikin lifestyle positively impacts risk factors.  Exercise Biomechanics Clinical staff led group instruction and group discussion with PowerPoint presentation and patient guidebook. To enhance the learning environment the use of posters, models and videos may be added. Patients will learn how the structural parts of their bodies function and how these functions impact their daily activities, movement, and exercise. Patients will learn how to promote a neutral spine, learn how to manage pain, and identify ways to improve their physical movement in order to promote healthy living. The purpose of this lesson is to expose patients to common physical limitations that impact physical activity. Participants will learn practical ways to adapt and manage aches and pains, and to minimize their effect on regular exercise. Patients will learn how to maintain good posture while sitting, walking, and lifting.  Balance Training and Fall Prevention  Clinical staff led group instruction and group discussion with PowerPoint presentation and patient guidebook. To enhance the learning environment the use of posters, models and videos may be added. At the conclusion of this workshop, patients will understand the importance of their sensorimotor skills (vision, proprioception, and the vestibular system) in maintaining their ability to balance as they age. Patients will apply a variety of balancing exercises that are appropriate for their current level of function. Patients will understand the common causes for poor balance, possible solutions to these problems, and ways to  modify their physical environment in order to minimize their fall risk. The purpose of this lesson is to teach patients about the importance of maintaining balance as they age and ways to minimize their risk of falling.  WORKSHOPS   Nutrition:  Fueling a Ship broker led group instruction and group discussion with PowerPoint presentation and patient guidebook. To enhance the learning environment the use of posters, models and videos may be added. Patients will review the foundational principles of the Pritikin Eating Plan and understand what constitutes a serving size in each of the food groups. Patients will also learn Pritikin-friendly foods that are better choices when away from home and review make-ahead meal and snack options. Calorie density will be reviewed and applied to three nutrition priorities: weight maintenance, weight loss, and weight gain. The purpose of this lesson is to reinforce (in a group setting) the key concepts around what patients are recommended to eat and how to apply these guidelines when away from home by planning and selecting Pritikin-friendly options. Patients will understand how calorie density may be adjusted for different weight management goals.  Mindful Eating  Clinical staff led group instruction and group  discussion with PowerPoint presentation and patient guidebook. To enhance the learning environment the use of posters, models and videos may be added. Patients will briefly review the concepts of the Pritikin Eating Plan and the importance of low-calorie dense foods. The concept of mindful eating will be introduced as well as the importance of paying attention to internal hunger signals. Triggers for non-hunger eating and techniques for dealing with triggers will be explored. The purpose of this lesson is to provide patients with the opportunity to review the basic principles of the Pritikin Eating Plan, discuss the value of eating mindfully and how to  measure internal cues of hunger and fullness using the Hunger Scale. Patients will also discuss reasons for non-hunger eating and learn strategies to use for controlling emotional eating.  Targeting Your Nutrition Priorities Clinical staff led group instruction and group discussion with PowerPoint presentation and patient guidebook. To enhance the learning environment the use of posters, models and videos may be added. Patients will learn how to determine their genetic susceptibility to disease by reviewing their family history. Patients will gain insight into the importance of diet as part of an overall healthy lifestyle in mitigating the impact of genetics and other environmental insults. The purpose of this lesson is to provide patients with the opportunity to assess their personal nutrition priorities by looking at their family history, their own health history and current risk factors. Patients will also be able to discuss ways of prioritizing and modifying the Pritikin Eating Plan for their highest risk areas  Menu  Clinical staff led group instruction and group discussion with PowerPoint presentation and patient guidebook. To enhance the learning environment the use of posters, models and videos may be added. Using menus brought in from [employer], or printed from [employer], patients will apply the Pritikin dining out guidelines that were presented in the [employer] video. Patients will also be able to practice these guidelines in a variety of provided scenarios. The purpose of this lesson is to provide patients with the opportunity to practice hands-on learning of the Pritikin Dining Out guidelines with actual menus and practice scenarios.  Label Reading Clinical staff led group instruction and group discussion with PowerPoint presentation and patient guidebook. To enhance the learning environment the use of posters, models and videos may be added. Patients will review  and discuss the Pritikin label reading guidelines presented in Pritikin's Label Reading Educational series video. Using fool labels brought in from local grocery stores and markets, patients will apply the label reading guidelines and determine if the packaged food meet the Pritikin guidelines. The purpose of this lesson is to provide patients with the opportunity to review, discuss, and practice hands-on learning of the Pritikin Label Reading guidelines with actual packaged food labels. Cooking School  Pritikin's [employer] are designed to teach patients ways to prepare quick, simple, and affordable recipes at home. The importance of nutrition's role in chronic disease risk reduction is reflected in its emphasis in the overall Pritikin program. By learning how to prepare essential core Pritikin Eating Plan recipes, patients will increase control over what they eat; be able to customize the flavor of foods without the use of added salt, sugar, or fat; and improve the quality of the food they consume. By learning a set of core recipes which are easily assembled, quickly prepared, and affordable, patients are more likely to prepare more healthy foods at home. These workshops focus on convenient breakfasts, simple entres, side dishes, and  desserts which can be prepared with minimal effort and are consistent with nutrition recommendations for cardiovascular risk reduction. Cooking [employer] are taught by a Armed forces logistics/support/administrative officer (RD) who has been trained by the [employer]. The chef or RD has a clear understanding of the importance of minimizing - if not completely eliminating - added fat, sugar, and sodium in recipes. Throughout the series of Cooking School Workshop sessions, patients will learn about healthy ingredients and efficient methods of cooking to build confidence in their capability to prepare    Cooking School weekly topics:  Adding Flavor- Sodium-Free  Fast  and Healthy Breakfasts  Powerhouse Plant-Based Proteins  Satisfying Salads and Dressings  Simple Sides and Sauces  International Cuisine-Spotlight on the [employer] Zones  Delicious Desserts  Savory Soups  [employer] - Meals in a Astronomer Appetizers and Snacks  Comforting Weekend Breakfasts  One-Pot Wonders   Fast Evening Meals  Landscape architect Your Pritikin Plate  WORKSHOPS   Healthy Mindset (Psychosocial):  Focused Goals, Sustainable Changes Clinical staff led group instruction and group discussion with PowerPoint presentation and patient guidebook. To enhance the learning environment the use of posters, models and videos may be added. Patients will be able to apply effective goal setting strategies to establish at least one personal goal, and then take consistent, meaningful action toward that goal. They will learn to identify common barriers to achieving personal goals and develop strategies to overcome them. Patients will also gain an understanding of how our mind-set can impact our ability to achieve goals and the importance of cultivating a positive and growth-oriented mind-set. The purpose of this lesson is to provide patients with a deeper understanding of how to set and achieve personal goals, as well as the tools and strategies needed to overcome common obstacles which may arise along the way.  From Head to Heart: The Power of a Healthy Outlook  Clinical staff led group instruction and group discussion with PowerPoint presentation and patient guidebook. To enhance the learning environment the use of posters, models and videos may be added. Patients will be able to recognize and describe the impact of emotions and mood on physical health. They will discover the importance of self-care and explore self-care practices which may work for them. Patients will also learn how to utilize the 4 C's to cultivate a healthier outlook and better manage stress and  challenges. The purpose of this lesson is to demonstrate to patients how a healthy outlook is an essential part of maintaining good health, especially as they continue their cardiac rehab journey.  Healthy Sleep for a Healthy Heart Clinical staff led group instruction and group discussion with PowerPoint presentation and patient guidebook. To enhance the learning environment the use of posters, models and videos may be added. At the conclusion of this workshop, patients will be able to demonstrate knowledge of the importance of sleep to overall health, well-being, and quality of life. They will understand the symptoms of, and treatments for, common sleep disorders. Patients will also be able to identify daytime and nighttime behaviors which impact sleep, and they will be able to apply these tools to help manage sleep-related challenges. The purpose of this lesson is to provide patients with a general overview of sleep and outline the importance of quality sleep. Patients will learn about a few of the most common sleep disorders. Patients will also be introduced to the concept of "sleep hygiene," and discover ways to self-manage certain sleeping  problems through simple daily behavior changes. Finally, the workshop will motivate patients by clarifying the links between quality sleep and their goals of heart-healthy living.   Recognizing and Reducing Stress Clinical staff led group instruction and group discussion with PowerPoint presentation and patient guidebook. To enhance the learning environment the use of posters, models and videos may be added. At the conclusion of this workshop, patients will be able to understand the types of stress reactions, differentiate between acute and chronic stress, and recognize the impact that chronic stress has on their health. They will also be able to apply different coping mechanisms, such as reframing negative self-talk. Patients will have the opportunity to practice a  variety of stress management techniques, such as deep abdominal breathing, progressive muscle relaxation, and/or guided imagery.  The purpose of this lesson is to educate patients on the role of stress in their lives and to provide healthy techniques for coping with it.  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   Learning Preferences Audio;Skilled Demonstration          Education Topics:  Knowledge Questionnaire Score:  Knowledge Questionnaire Score - 01/11/24 1014       Knowledge Questionnaire Score   Pre Score 23/24          Core Components/Risk Factors/Patient Goals at Admission:  Personal Goals and Risk Factors at Admission - 01/11/24 1429       Core Components/Risk Factors/Patient Goals on Admission    Weight Management Weight Maintenance    Intervention Weight Management: Develop a combined nutrition and exercise program designed to reach desired caloric intake, while maintaining appropriate intake of nutrient and fiber, sodium and fats, and appropriate energy expenditure required for the weight goal.;Weight Management: Provide education and appropriate resources to help participant work on and attain dietary goals.    Expected Outcomes Weight Maintenance: Understanding of the daily nutrition guidelines, which includes 25-35% calories from fat, 7% or less cal from saturated fats, less than 200mg  cholesterol, less than 1.5gm of sodium, & 5 or more servings of fruits and vegetables daily;Long Term: Adherence to nutrition and physical activity/exercise program aimed toward attainment of established weight goal;Short Term: Continue to assess and modify interventions until short term weight is achieved    Heart Failure Yes    Intervention Provide a combined exercise and nutrition program that is supplemented with education, support and  counseling about heart failure. Directed toward relieving symptoms such as shortness of breath, decreased exercise tolerance, and extremity edema.    Expected Outcomes Improve functional capacity of life;Short term: Attendance in program 2-3 days a week with increased exercise capacity. Reported lower sodium intake. Reported increased fruit and vegetable intake. Reports medication compliance.;Short term: Daily weights obtained and reported for increase. Utilizing diuretic protocols set by physician.;Long term: Adoption of self-care skills and reduction of barriers for early signs and symptoms recognition and intervention leading to self-care maintenance.    Intervention Provide education on lifestyle modifcations including regular physical activity/exercise, weight management, moderate sodium restriction and increased consumption of fresh fruit, vegetables, and low fat dairy, alcohol  moderation, and smoking cessation.;Monitor prescription use compliance.    Expected Outcomes Short Term: Continued assessment and intervention until BP is < 140/31mm HG in hypertensive participants. < 130/69mm HG in hypertensive participants with diabetes, heart failure or chronic kidney disease.;Long Term: Maintenance of blood pressure at goal levels.  Intervention Provide education and support for participant on nutrition & aerobic/resistive exercise along with prescribed medications to achieve LDL 70mg , HDL >40mg .    Expected Outcomes Short Term: Participant states understanding of desired cholesterol values and is compliant with medications prescribed. Participant is following exercise prescription and nutrition guidelines.;Long Term: Cholesterol controlled with medications as prescribed, with individualized exercise RX and with personalized nutrition plan. Value goals: LDL < 70mg , HDL > 40 mg.    Stress Yes    Intervention Offer individual and/or small group education and counseling on adjustment to heart disease, stress  management and health-related lifestyle change. Teach and support self-help strategies.;Refer participants experiencing significant psychosocial distress to appropriate mental health specialists for further evaluation and treatment. When possible, include family members and significant others in education/counseling sessions.    Expected Outcomes Short Term: Participant demonstrates changes in health-related behavior, relaxation and other stress management skills, ability to obtain effective social support, and compliance with psychotropic medications if prescribed.;Long Term: Emotional wellbeing is indicated by absence of clinically significant psychosocial distress or social isolation.          Core Components/Risk Factors/Patient Goals Review:    Core Components/Risk Factors/Patient Goals at Discharge (Final Review):    ITP Comments:   Comments: Participant attended orientation for the cardiac rehabilitation program on  01/11/2024  to perform initial intake and exercise walk test. Patient introduced to the [facility] education and orientation packet was reviewed. Completed 6-minute walk test using Rollator, measurements, initial ITP, and exercise prescription. Vital signs stable. Telemetry-atrial paced, RBBB, asymptomatic.   Service time was from 9:32 to 11:30.

## 2024-01-16 NOTE — Progress Notes (Signed)
 Remote pacemaker transmission.

## 2024-01-18 DIAGNOSIS — H353123 Nonexudative age-related macular degeneration, left eye, advanced atrophic without subfoveal involvement: Secondary | ICD-10-CM | POA: Diagnosis not present

## 2024-01-18 DIAGNOSIS — H20042 Secondary noninfectious iridocyclitis, left eye: Secondary | ICD-10-CM | POA: Diagnosis not present

## 2024-01-18 DIAGNOSIS — H353221 Exudative age-related macular degeneration, left eye, with active choroidal neovascularization: Secondary | ICD-10-CM | POA: Diagnosis not present

## 2024-01-18 DIAGNOSIS — H353114 Nonexudative age-related macular degeneration, right eye, advanced atrophic with subfoveal involvement: Secondary | ICD-10-CM | POA: Diagnosis not present

## 2024-01-18 DIAGNOSIS — H35033 Hypertensive retinopathy, bilateral: Secondary | ICD-10-CM | POA: Diagnosis not present

## 2024-01-18 DIAGNOSIS — H43812 Vitreous degeneration, left eye: Secondary | ICD-10-CM | POA: Diagnosis not present

## 2024-01-18 DIAGNOSIS — H43392 Other vitreous opacities, left eye: Secondary | ICD-10-CM | POA: Diagnosis not present

## 2024-01-23 ENCOUNTER — Encounter: Payer: Self-pay | Admitting: Cardiology

## 2024-01-24 ENCOUNTER — Encounter (HOSPITAL_COMMUNITY)
Admission: RE | Admit: 2024-01-24 | Discharge: 2024-01-24 | Disposition: A | Discharge status: Home / Self Care | Source: Ambulatory Visit | Attending: Cardiology | Admitting: Cardiology

## 2024-01-24 DIAGNOSIS — Z955 Presence of coronary angioplasty implant and graft: Secondary | ICD-10-CM | POA: Diagnosis not present

## 2024-01-24 DIAGNOSIS — I213 ST elevation (STEMI) myocardial infarction of unspecified site: Secondary | ICD-10-CM | POA: Insufficient documentation

## 2024-01-24 DIAGNOSIS — I5042 Chronic combined systolic (congestive) and diastolic (congestive) heart failure: Secondary | ICD-10-CM | POA: Insufficient documentation

## 2024-01-24 NOTE — Progress Notes (Signed)
 Daily Session Note  Patient Details  Name: Edwena Mayorga MRN: [id number] Date of Birth: 06/20/1941 Referring Provider:   Flowsheet Row INTENSIVE CARDIAC REHAB ORIENT from 01/11/2024 in [facility]  Referring Provider [name], MD    Encounter Date: 01/24/2024  Check In:  Session Check In - 01/24/24 1007       Check-In   Supervising physician immediately available to respond to emergencies [facility] MD immediately available    Physician(s) Lum Ran NP    Location [facility]    Staff Present Alm Parkins, MS, ACSM-CEP, CCRP, Exercise Physiologist;Neng Albee Valere, MS, ACSM-CEP, Exercise Physiologist;Jetta Vannie BS, ACSM-CEP, Exercise Physiologist;Johnny Fayette, MS, Exercise Physiologist;Maria Whitaker, RN, BSN;Other    Virtual Visit No    Medication changes reported     No    Fall or balance concerns reported    No    Tobacco Cessation No Change    Warm-up and Cool-down Performed as group-led instruction   CRP2 orienation today   Resistance Training Performed Yes    VAD Patient? No    PAD/SET Patient? No      Pain Assessment   Currently in Pain? No/denies    Pain Score 0-No pain    Multiple Pain Sites No          Capillary Blood Glucose: No results found for this or any previous visit (from the past 24 hours).   Exercise Prescription Changes - 01/24/24 1032       Response to Exercise   Blood Pressure (Admit) 150/66    Blood Pressure (Exercise) 142/70    Blood Pressure (Exit) 132/70    Heart Rate (Admit) 79 bpm    Heart Rate (Exercise) 101 bpm    Heart Rate (Exit) 78 bpm    Rating of Perceived Exertion (Exercise) 10    Symptoms None    Comments Off to a good start with exercise.    Duration Progress to 30 minutes of  aerobic without signs/symptoms of physical distress    Intensity THRR unchanged      Progression   Progression Continue to progress workloads to  maintain intensity without signs/symptoms of physical distress.    Average METs 1.8      Resistance Training   Training Prescription No    Weight Relaxation day, no weights.      Interval Training   Interval Training No      NuStep   Level 1    SPM 54    Minutes 25    METs 1.8          Social History   Tobacco Use  Smoking Status Never   Passive exposure: Never  Smokeless Tobacco Never    Goals Met:  Exercise tolerated well No report of concerns or symptoms today  Goals Unmet:  Not Applicable  Comments: Dagoberto tolerated low intensity exercise well without symptoms. She was able to complete 25 minutes on the NuStep machine and completed warm-up and cool-down stretches without any issues. Her blood pressure was mildly elevated at rest. Will continue to monitor. Her rhythm is atrial paced with a right bundle branch block.  Dr. Wilbert Bihari is Medical Director for Cardiac Rehab at [facility].

## 2024-01-29 ENCOUNTER — Encounter (HOSPITAL_COMMUNITY)
Admission: RE | Admit: 2024-01-29 | Discharge: 2024-01-29 | Disposition: A | Discharge status: Home / Self Care | Source: Ambulatory Visit | Attending: Cardiology

## 2024-01-29 DIAGNOSIS — Z955 Presence of coronary angioplasty implant and graft: Secondary | ICD-10-CM | POA: Diagnosis not present

## 2024-01-29 DIAGNOSIS — I5042 Chronic combined systolic (congestive) and diastolic (congestive) heart failure: Secondary | ICD-10-CM | POA: Diagnosis not present

## 2024-01-29 DIAGNOSIS — I213 ST elevation (STEMI) myocardial infarction of unspecified site: Secondary | ICD-10-CM | POA: Diagnosis not present

## 2024-01-31 ENCOUNTER — Encounter (HOSPITAL_COMMUNITY)

## 2024-01-31 ENCOUNTER — Telehealth (HOSPITAL_COMMUNITY): Payer: Self-pay

## 2024-01-31 NOTE — Telephone Encounter (Signed)
 Patient's husband called stating patient is having shoulder pain this morning and won't be in for 10:15 CR class.

## 2024-02-01 ENCOUNTER — Encounter: Payer: Self-pay | Admitting: Cardiology

## 2024-02-01 NOTE — Telephone Encounter (Signed)
 Called patient about message. Patient is not sure what she takes. Lasix  20 mg on every MWF and Coreg  3.125 mg BID is on patient's list. Encouraged patient to drink some fluids and have a small salty snack to see if that helps. Encouraged patient to continue to monitor her BP and HR. Will send message to Dr. Kate for further advisement.

## 2024-02-02 ENCOUNTER — Encounter (HOSPITAL_COMMUNITY)

## 2024-02-02 NOTE — Telephone Encounter (Signed)
 How is she feeling today?  What is BP/heart rate?  Can we get her an appointment sometime soon?  APP appointment is fine if I do not have any openings

## 2024-02-05 ENCOUNTER — Encounter (HOSPITAL_COMMUNITY)
Admission: RE | Admit: 2024-02-05 | Discharge: 2024-02-05 | Disposition: A | Discharge status: Home / Self Care | Source: Ambulatory Visit | Attending: Cardiology

## 2024-02-05 DIAGNOSIS — I5042 Chronic combined systolic (congestive) and diastolic (congestive) heart failure: Secondary | ICD-10-CM | POA: Diagnosis not present

## 2024-02-05 DIAGNOSIS — I213 ST elevation (STEMI) myocardial infarction of unspecified site: Secondary | ICD-10-CM | POA: Diagnosis not present

## 2024-02-05 DIAGNOSIS — Z955 Presence of coronary angioplasty implant and graft: Secondary | ICD-10-CM | POA: Diagnosis not present

## 2024-02-05 NOTE — Progress Notes (Signed)
 Cardiac Individual Treatment Plan  Patient Details  Name: Connie [name] MRN: [id number] Date of Birth: 09/28/1940 Referring Provider:   Flowsheet Row INTENSIVE CARDIAC REHAB ORIENT from 01/11/2024 in [facility]  Referring Provider [name], MD    Initial Encounter Date:  Flowsheet Row INTENSIVE CARDIAC REHAB ORIENT from 01/11/2024 in [facility], Vascular, & Lung [facility]  Date 01/11/24    Visit Diagnosis: Heart failure, systolic and diastolic, chronic (HCC)  Patient's Home Medications on Admission:  Current Outpatient Medications:    aspirin  EC 81 MG tablet, Take 1 tablet (81 mg total) by mouth daily. Swallow whole., Disp: , Rfl:    atorvastatin  (LIPITOR ) 40 MG tablet, Take 1 tablet (40 mg total) by mouth daily., Disp: 90 tablet, Rfl: 3   buPROPion (WELLBUTRIN XL) 150 MG 24 hr tablet, Take 150 mg by mouth daily., Disp: , Rfl:    carvedilol  (COREG ) 3.125 MG tablet, Take 1 tablet (3.125 mg total) by mouth 2 (two) times daily., Disp: 180 tablet, Rfl: 3   ELIQUIS  5 MG TABS tablet, TAKE 1 TABLET(5 MG) BY MOUTH TWICE DAILY, Disp: 60 tablet, Rfl: 5   empagliflozin  (JARDIANCE ) 10 MG TABS tablet, Take 1 tablet (10 mg total) by mouth daily., Disp: 90 tablet, Rfl: 3   esomeprazole  (NEXIUM ) 20 MG capsule, Take 1 capsule (20 mg total) by mouth as needed., Disp: , Rfl:    furosemide  (LASIX ) 20 MG tablet, Take 1 tablet (20 mg total) by mouth every Monday, Wednesday, and Friday., Disp: 36 tablet, Rfl: 3   levothyroxine  (SYNTHROID ) 100 MCG tablet, Take 100 mcg by mouth daily before breakfast., Disp: , Rfl:    nitroGLYCERIN  (NITROSTAT ) 0.4 MG SL tablet, PLACE 1 TABLET UNDER THE TONGUE EVERY 5 MINUTES FOR 3 DOSES AS NEEDED FOR CHEST PAIN, Disp: 25 tablet, Rfl: 10   polyethylene glycol (MIRALAX ) 17 g packet, Take 17 g by mouth daily. 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   Multiple, noted on US    History of hiatal hernia    Hypertension    Hypothyroidism    Macular degeneration    Psoriatic arthritis (HCC)    Sinus drainage    UTI (urinary tract infection)     Tobacco Use: Social History   Tobacco Use  Smoking Status Never   Passive exposure: Never  Smokeless Tobacco Never    Labs: Review Flowsheet       Latest Ref Rng & Units 07/27/2021 04/07/2022 05/06/2022 12/19/2023  Labs for ITP Cardiac and Pulmonary Rehab  Cholestrol 100 - 199 mg/dL 848  874  891  870   LDL (calc) 0 - 99 mg/dL 75  54  46  59   HDL-C >39 mg/dL 50  50  44  55   Trlycerides 0 - 149 mg/dL 847  896  91  75   Hemoglobin A1c 4.8 - 5.6 % - 5.6  - -  TCO2 22 - 32 mmol/L - 23  - -    Capillary Blood Glucose: Lab Results  Component Value Date   GLUCAP 165 (H) 04/08/2022  GLUCAP 137 (H) 04/08/2022     Exercise Target Goals: Exercise Program Goal: Individual exercise prescription set using results from initial 6 min walk test and THRR while considering  patient's activity barriers and safety.   Exercise Prescription Goal: Initial exercise prescription builds to 30-45 minutes a day of aerobic activity, 2-3 days per week.  Home exercise guidelines will be given to patient during program as part of exercise prescription that the participant will acknowledge.  Activity Barriers & Risk Stratification:  Activity Barriers & Cardiac Risk Stratification - 01/11/24 1016       Activity Barriers & Cardiac Risk Stratification   Activity Barriers Left Knee Replacement;Right Knee Replacement;Balance Concerns;Assistive Device;Arthritis;Joint Problems;Other (comment);Muscular Weakness;Deconditioning;Decreased Ventricular Function     Comments Psoriatic arthritis causes joint pain, left total shoulder replacement, macular degeneration, hx back surgery for herniated disc in 2000, recently broke foot    Cardiac Risk Stratification High          6 Minute Walk:  6 Minute Walk     Row Name 01/11/24 1414         6 Minute Walk   Phase Initial  Used Rollator     Distance 925 feet     Walk Time 6 minutes     # of Rest Breaks 0     MPH 1.75     METS 1.7     RPE 11     Perceived Dyspnea  0     VO2 Peak 5.84     Symptoms No     Resting HR 61 bpm     Resting BP 140/66     Resting Oxygen Saturation  97 %     Exercise Oxygen Saturation  during 6 min walk 97 %     Max Ex. HR 111 bpm     Max Ex. BP 148/76     2 Minute Post BP 128/70        Oxygen Initial Assessment:   Oxygen Re-Evaluation:   Oxygen Discharge (Final Oxygen Re-Evaluation):   Initial Exercise Prescription:  Initial Exercise Prescription - 01/11/24 1000       Date of Initial Exercise RX and Referring Provider   Date 01/11/24    Referring Provider [name], MD    Expected Discharge Date 04/03/24      NuStep   Level 1    SPM 70    Minutes 30    METs 1.7      Prescription Details   Frequency (times per week) 2    Duration Progress to 30 minutes of continuous aerobic without signs/symptoms of physical distress      Intensity   THRR 40-80% of Max Heartrate 54-109    Ratings of Perceived Exertion 11-13    Perceived Dyspnea 0-4      Progression   Progression Continue progressive overload as per policy without signs/symptoms or physical distress.      Resistance Training   Training Prescription Yes    Weight 2 lbs    Reps 10-15          Perform Capillary Blood Glucose checks as needed.  Exercise Prescription Changes:   Exercise Prescription Changes     Row Name 01/24/24 1032 02/05/24 1026           Response to Exercise   Blood Pressure (Admit) 150/66 132/78      Blood Pressure (Exercise) 142/70 132/70       Blood Pressure (Exit) 132/70 130/70  Heart Rate (Admit) 79 bpm 84 bpm      Heart Rate (Exercise) 101 bpm 114 bpm      Heart Rate (Exit) 78 bpm 93 bpm      Rating of Perceived Exertion (Exercise) 10 12      Symptoms None None      Comments Off to a good start with exercise. --      Duration Progress to 30 minutes of  aerobic without signs/symptoms of physical distress Progress to 30 minutes of  aerobic without signs/symptoms of physical distress      Intensity THRR unchanged THRR unchanged        Progression   Progression Continue to progress workloads to maintain intensity without signs/symptoms of physical distress. Continue to progress workloads to maintain intensity without signs/symptoms of physical distress.      Average METs 1.8 2.3        Resistance Training   Training Prescription No Yes      Weight Relaxation day, no weights. 2 lbs      Reps -- 10-15      Time -- 5 Minutes        Interval Training   Interval Training No No        NuStep   Level 1 1      SPM 54 68      Minutes 25 27      METs 1.8 2.3         Exercise Comments:   Exercise Comments     Row Name 01/24/24 1131           Exercise Comments Connie [name] tolerated low intensity exercise for 25 minutes without symptoms. Goal to increase exercise duration to 30 minutes. Oriented Connie [name] to the exercise equipment and stretching routine.          Exercise Goals and Review:   Exercise Goals     Row Name 01/11/24 1015             Exercise Goals   Increase Physical Activity Yes       Intervention Provide advice, education, support and counseling about physical activity/exercise needs.;Develop an individualized exercise prescription for aerobic and resistive training based on initial evaluation findings, risk stratification, comorbidities and participant's personal goals.       Expected Outcomes Short Term: Attend rehab on a regular basis to increase amount of physical activity.;Long Term: Add in home  exercise to make exercise part of routine and to increase amount of physical activity.;Long Term: Exercising regularly at least 3-5 days a week.       Increase Strength and Stamina Yes       Intervention Provide advice, education, support and counseling about physical activity/exercise needs.;Develop an individualized exercise prescription for aerobic and resistive training based on initial evaluation findings, risk stratification, comorbidities and participant's personal goals.       Expected Outcomes Long Term: Improve cardiorespiratory fitness, muscular endurance and strength as measured by increased METs and functional capacity ( );Short Term: Perform resistance training exercises routinely during rehab and add in resistance training at home;Short Term: Increase workloads from initial exercise prescription for resistance, speed, and METs.       Able to understand and use rate of perceived exertion (RPE) scale Yes       Intervention Provide education and explanation on how to use RPE scale       Expected Outcomes Short Term: Able to use RPE daily in rehab to express subjective intensity level;Long Term:  Able to use RPE to guide intensity level when exercising independently       Knowledge and understanding of Target Heart Rate Range (THRR) Yes       Intervention Provide education and explanation of THRR including how the numbers were predicted and where they are located for reference       Expected Outcomes Short Term: Able to state/look up THRR;Long Term: Able to use THRR to govern intensity when exercising independently;Short Term: Able to use daily as guideline for intensity in rehab       Able to check pulse independently Yes       Intervention Provide education and demonstration on how to check pulse in carotid and radial arteries.;Review the importance of being able to check your own pulse for safety during independent exercise       Expected Outcomes Long Term: Able to check pulse  independently and accurately;Short Term: Able to explain why pulse checking is important during independent exercise       Understanding of Exercise Prescription Yes       Intervention Provide education, explanation, and written materials on patient's individual exercise prescription       Expected Outcomes Short Term: Able to explain program exercise prescription;Long Term: Able to explain home exercise prescription to exercise independently          Exercise Goals Re-Evaluation :  Exercise Goals Re-Evaluation     Row Name 01/24/24 1131             Exercise Goal Re-Evaluation   Exercise Goals Review Increase Physical Activity;Increase Strength and Stamina;Able to understand and use rate of perceived exertion (RPE) scale       Comments Connie [name] was able to understand and use RPE scale appropriately.       Expected Outcomes Progress workloads as tolerated to help increase strength and stamina.          Discharge Exercise Prescription (Final Exercise Prescription Changes):  Exercise Prescription Changes - 02/05/24 1026       Response to Exercise   Blood Pressure (Admit) 132/78    Blood Pressure (Exercise) 132/70    Blood Pressure (Exit) 130/70    Heart Rate (Admit) 84 bpm    Heart Rate (Exercise) 114 bpm    Heart Rate (Exit) 93 bpm    Rating of Perceived Exertion (Exercise) 12    Symptoms None    Duration Progress to 30 minutes of  aerobic without signs/symptoms of physical distress    Intensity THRR unchanged      Progression   Progression Continue to progress workloads to maintain intensity without signs/symptoms of physical distress.    Average METs 2.3      Resistance Training   Training Prescription Yes    Weight 2 lbs    Reps 10-15    Time 5 Minutes      Interval Training   Interval Training No      NuStep   Level 1    SPM 68    Minutes 27    METs 2.3          Nutrition:  Target Goals: Understanding of nutrition guidelines, daily intake of sodium  1500mg , cholesterol 200mg , calories 30% from fat and 7% or less from saturated fats, daily to have 5 or more servings of fruits and vegetables.  Biometrics:  Pre Biometrics - 01/11/24 0948       Pre Biometrics   Waist Circumference 37.5 inches    Hip Circumference 41 inches  Waist to Hip Ratio 0.91 %    Triceps Skinfold 19 mm    % Body Fat 38.9 %    Grip Strength 14 kg    Flexibility 0 in   could not reach   Single Leg Stand 1 seconds           Nutrition Therapy Plan and Nutrition Goals:  Nutrition Therapy & Goals - 01/29/24 1049       Nutrition Therapy   Diet Heart healthy diet    Drug/Food Interactions Statins/Certain Fruits      Personal Nutrition Goals   Nutrition Goal Patient to identify strategies for reducing cardiovascular risk by attending the Pritikin education and nutrition series weekly.    Personal Goal #2 Patient to improve diet quality by using the plate method as a guide for meal planning to include lean protein/plant protein, fruits, vegetables, whole grains, nonfat dairy as part of a well-balanced diet.    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 She was previously referred to cardiac rehab in 2024 but never attended and was discharged from the program. Lipids are at goal. Patient will benefit from participation in intensive cardiac rehab for nutrition education, exercise, and lifestyle modification.      Intervention Plan   Intervention Prescribe, educate and counsel regarding individualized specific dietary modifications aiming towards targeted core components such as weight, hypertension, lipid management, diabetes, heart failure and other comorbidities.;Nutrition handout(s) given to patient.    Expected Outcomes Short Term Goal: Understand basic principles of dietary content, such as calories, fat, sodium, cholesterol and nutrients.;Long Term Goal: Adherence to prescribed nutrition plan.           Nutrition Assessments:  Nutrition Assessments - 01/29/24 1035       Rate Your Plate Scores   Pre Score 73         MEDIFICTS Score Key: >=70 Need to make dietary changes  40-70 Heart Healthy Diet <= 40 Therapeutic Level Cholesterol Diet   Flowsheet Row INTENSIVE CARDIAC REHAB from 01/24/2024 in [facility]  Picture Your Plate Total Score on Admission 73   Picture Your Plate Scores: <59 Unhealthy dietary pattern with much room for improvement. 41-50 Dietary pattern unlikely to meet recommendations for good health and room for improvement. 51-60 More healthful dietary pattern, with some room for improvement.  >60 Healthy dietary pattern, although there may be some specific behaviors that could be improved.    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 She was previously referred to cardiac rehab in 2024 but never attended and was discharged from the program. Lipids are at goal. Patient will benefit from participation in intensive cardiac rehab for nutrition education, exercise, and lifestyle modification.          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 She was previously referred to cardiac rehab in 2024 but never attended and was discharged from the program. Lipids are at goal. Patient will benefit from participation in intensive cardiac rehab for nutrition  education, exercise, and lifestyle modification.          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 She was previously referred to cardiac rehab in 2024 but never attended and was discharged from the program. Lipids are at goal. Patient will benefit from participation in intensive cardiac rehab for nutrition education, exercise, and lifestyle modification.          Psychosocial: Target Goals: Acknowledge presence or absence of significant depression and/or stress, maximize coping skills, provide positive support system. Participant is able to verbalize types and ability to use techniques and skills needed for reducing stress and depression.  Initial Review & Psychosocial Screening:  Initial Psych Review & Screening - 01/11/24 1002       Initial Review   Current issues with Current Depression;History of Depression;Current Psychotropic Meds;Current Stress Concerns    Comments Pt is losing her vison, spouse has to read to her. Depends on him for transportation, medications, etc.      Family Dynamics   Good Support System? Yes   Pt has sposue     Barriers   Psychosocial barriers to participate in program The patient should benefit from training in stress management and relaxation.      Screening Interventions   Interventions Encouraged to exercise;To provide support and resources with identified psychosocial needs;Provide feedback about the scores to participant    Expected Outcomes Long Term Goal: Stressors or current issues are controlled or eliminated.;Long Term goal: The participant improves quality of Life and PHQ9 Scores as seen by post scores and/or verbalization of changes;Short Term goal:  Identification and review with participant of any Quality of Life or Depression concerns found by scoring the questionnaire.          Quality of Life Scores:  Quality of Life - 01/11/24 1428       Quality of Life   Select Quality of Life      Quality of Life Scores   Health/Function Pre 23.04 %    Socioeconomic Pre 24.67 %    Psych/Spiritual Pre 25.07 %    Family Pre 30 %    GLOBAL Pre 24.71 %         Scores of 19 and below usually indicate a poorer quality of life in these areas.  A difference of  2-3 points is a clinically meaningful difference.  A difference of 2-3 points in the total score of the Quality of Life Index has been associated with significant improvement in overall quality of life, self-image, physical symptoms, and general health in studies assessing change in quality of life.  PHQ-9: Review Flowsheet       01/11/2024 08/23/2022  Depression screen PHQ 2/9  Decreased Interest 0 1  Down, Depressed, Hopeless 1 1  PHQ - 2 Score 1 2  Altered sleeping 0 0  Tired, decreased energy 1 1  Change in appetite 0 0  Feeling bad or failure about yourself  1 -  Trouble concentrating 0 1  Moving slowly or fidgety/restless 0 0  Suicidal thoughts 0 0  PHQ-9 Score 3 4  Difficult doing work/chores Somewhat difficult Somewhat difficult   Interpretation of Total Score  Total Score Depression Severity:  1-4 = Minimal depression, 5-9 = Mild depression, 10-14 = Moderate depression, 15-19 = Moderately severe depression, 20-27 = Severe depression   Psychosocial Evaluation and Intervention:   Psychosocial Re-Evaluation:  Psychosocial Re-Evaluation     Row Name 01/24/24 1700             Psychosocial Re-Evaluation   Current issues with Current Stress Concerns;Current Psychotropic Meds;Current Depression;History of Depression       Comments Connie [name] did not voice any increased concerns or stressors on her first day of exercise, Will review PHQ9 in the upcoming week        Expected Outcomes Connie [name] will have controlled or decreased depression upcon completion of cardiac rehab       Interventions Stress management education;Relaxation education;Encouraged to attend Cardiac Rehabilitation for the exercise       Continue Psychosocial Services  Follow up required by staff         Initial Review   Source of Stress Concerns Chronic Illness;Unable to participate in former interests or hobbies;Unable to perform yard/household activities;Transportation       Comments Will continue to monitor and offer support as needed.          Psychosocial Discharge (Final Psychosocial Re-Evaluation):  Psychosocial Re-Evaluation - 01/24/24 1700       Psychosocial Re-Evaluation   Current issues with Current Stress Concerns;Current Psychotropic Meds;Current Depression;History of Depression    Comments Connie [name] did not voice any increased concerns or stressors on her first day of exercise, Will review PHQ9 in the upcoming week    Expected Outcomes Connie [name] will have controlled or decreased depression upcon completion of cardiac rehab    Interventions Stress management education;Relaxation education;Encouraged to attend Cardiac Rehabilitation for the exercise    Continue Psychosocial Services  Follow up required by staff      Initial Review   Source of Stress Concerns Chronic Illness;Unable to participate in former interests or hobbies;Unable to perform yard/household activities;Transportation    Comments Will continue to monitor and offer support as needed.          Vocational Rehabilitation: Provide vocational rehab assistance to qualifying candidates.   Vocational Rehab Evaluation & Intervention:  Vocational Rehab - 01/11/24 1014       Initial Vocational Rehab Evaluation & Intervention   Assessment shows need for Vocational Rehabilitation No   Pt is retired         Education: Education Goals: Education classes will be provided on a weekly basis, covering required topics.  Participant will state understanding/return demonstration of topics presented.     Core Videos: Exercise    Move It!  Clinical staff conducted group or individual video education with verbal and written material and guidebook.  Patient learns the recommended Pritikin exercise program. Exercise with the goal of living a long, healthy life. Some of the health benefits of exercise include controlled diabetes, healthier blood pressure levels, improved cholesterol levels, improved heart and lung capacity, improved sleep, and better body composition. Everyone should speak with their doctor before starting or changing an exercise routine.  Biomechanical Limitations Clinical staff conducted group or individual  video education with verbal and written material and guidebook.  Patient learns how biomechanical limitations can impact exercise and how we can mitigate and possibly overcome limitations to have an impactful and balanced exercise routine.  Body Composition Clinical staff conducted group or individual video education with verbal and written material and guidebook.  Patient learns that body composition (ratio of muscle mass to fat mass) is a key component to assessing overall fitness, rather than body weight alone. Increased fat mass, especially visceral belly fat, can put us  at increased risk for metabolic syndrome, type 2 diabetes, heart disease, and even death. It is recommended to combine diet and exercise (cardiovascular and resistance training) to improve your body composition. Seek guidance from your physician and exercise physiologist before implementing an exercise routine.  Exercise Action Plan Clinical staff conducted group or individual video education with verbal and written material and guidebook.  Patient learns the recommended strategies to achieve and enjoy long-term exercise adherence, including variety, self-motivation, self-efficacy, and positive decision making. Benefits of  exercise include fitness, good health, weight management, more energy, better sleep, less stress, and overall well-being.  Medical   Heart Disease Risk Reduction Clinical staff conducted group or individual video education with verbal and written material and guidebook.  Patient learns our heart is our most vital organ as it circulates oxygen, nutrients, white blood cells, and hormones throughout the entire body, and carries waste away. Data supports a plant-based eating plan like the Pritikin Program for its effectiveness in slowing progression of and reversing heart disease. The video provides a number of recommendations to address heart disease.   Metabolic Syndrome and Belly Fat  Clinical staff conducted group or individual video education with verbal and written material and guidebook.  Patient learns what metabolic syndrome is, how it leads to heart disease, and how one can reverse it and keep it from coming back. You have metabolic syndrome if you have 3 of the following 5 criteria: abdominal obesity, high blood pressure, high triglycerides, low HDL cholesterol, and high blood sugar.  Hypertension and Heart Disease Clinical staff conducted group or individual video education with verbal and written material and guidebook.  Patient learns that high blood pressure, or hypertension, is very common in the [place] . Hypertension is largely due to excessive salt intake, but other important risk factors include being overweight, physical inactivity, drinking too much alcohol , smoking, and not eating enough potassium from fruits and vegetables. High blood pressure is a leading risk factor for heart attack, stroke, congestive heart failure, dementia, kidney failure, and premature death. Long-term effects of excessive salt intake include stiffening of the arteries and thickening of heart muscle and organ damage. Recommendations include ways to reduce hypertension and the risk of heart  disease.  Diseases of Our Time - Focusing on Diabetes Clinical staff conducted group or individual video education with verbal and written material and guidebook.  Patient learns why the best way to stop diseases of our time is prevention, through food and other lifestyle changes. Medicine (such as prescription pills and surgeries) is often only a Band-Aid on the problem, not a long-term solution. Most common diseases of our time include obesity, type 2 diabetes, hypertension, heart disease, and cancer. The Pritikin Program is recommended and has been proven to help reduce, reverse, and/or prevent the damaging effects of metabolic syndrome.  Nutrition   Overview of the Pritikin Eating Plan  Clinical staff conducted group or individual video education with verbal and written material and guidebook.  Patient  learns about the Pritikin Eating Plan for disease risk reduction. The Pritikin Eating Plan emphasizes a wide variety of unrefined, minimally-processed carbohydrates, like fruits, vegetables, whole grains, and legumes. Go, Caution, and Stop food choices are explained. Plant-based and lean animal proteins are emphasized. Rationale provided for low sodium intake for blood pressure control, low added sugars for blood sugar stabilization, and low added fats and oils for coronary artery disease risk reduction and weight management.  Calorie Density  Clinical staff conducted group or individual video education with verbal and written material and guidebook.  Patient learns about calorie density and how it impacts the Pritikin Eating Plan. Knowing the characteristics of the food you choose will help you decide whether those foods will lead to weight gain or weight loss, and whether you want to consume more or less of them. Weight loss is usually a side effect of the Pritikin Eating Plan because of its focus on low calorie-dense foods.  Label Reading  Clinical staff conducted group or individual video  education with verbal and written material and guidebook.  Patient learns about the Pritikin recommended label reading guidelines and corresponding recommendations regarding calorie density, added sugars, sodium content, and whole grains.  Dining Out - Part 1  Clinical staff conducted group or individual video education with verbal and written material and guidebook.  Patient learns that restaurant meals can be sabotaging because they can be so high in calories, fat, sodium, and/or sugar. Patient learns recommended strategies on how to positively address this and avoid unhealthy pitfalls.  Facts on Fats  Clinical staff conducted group or individual video education with verbal and written material and guidebook.  Patient learns that lifestyle modifications can be just as effective, if not more so, as many medications for lowering your risk of heart disease. A Pritikin lifestyle can help to reduce your risk of inflammation and atherosclerosis (cholesterol build-up, or plaque, in the artery walls). Lifestyle interventions such as dietary choices and physical activity address the cause of atherosclerosis. A review of the types of fats and their impact on blood cholesterol levels, along with dietary recommendations to reduce fat intake is also included.  Nutrition Action Plan  Clinical staff conducted group or individual video education with verbal and written material and guidebook.  Patient learns how to incorporate Pritikin recommendations into their lifestyle. Recommendations include planning and keeping personal health goals in mind as an important part of their success.  Healthy Mind-Set    Healthy Minds, Bodies, Hearts  Clinical staff conducted group or individual video education with verbal and written material and guidebook.  Patient learns how to identify when they are stressed. Video will discuss the impact of that stress, as well as the many benefits of stress management. Patient will also  be introduced to stress management techniques. The way we think, act, and feel has an impact on our hearts.  How Our Thoughts Can Heal Our Hearts  Clinical staff conducted group or individual video education with verbal and written material and guidebook.  Patient learns that negative thoughts can cause depression and anxiety. This can result in negative lifestyle behavior and serious health problems. Cognitive behavioral therapy is an effective method to help control our thoughts in order to change and improve our emotional outlook.  Additional Videos:  Exercise    Improving Performance  Clinical staff conducted group or individual video education with verbal and written material and guidebook.  Patient learns to use a non-linear approach by alternating intensity levels and lengths of  time spent exercising to help burn more calories and lose more body fat. Cardiovascular exercise helps improve heart health, metabolism, hormonal balance, blood sugar control, and recovery from fatigue. Resistance training improves strength, endurance, balance, coordination, reaction time, metabolism, and muscle mass. Flexibility exercise improves circulation, posture, and balance. Seek guidance from your physician and exercise physiologist before implementing an exercise routine and learn your capabilities and proper form for all exercise.  Introduction to Yoga  Clinical staff conducted group or individual video education with verbal and written material and guidebook.  Patient learns about yoga, a discipline of the coming together of mind, breath, and body. The benefits of yoga include improved flexibility, improved range of motion, better posture and core strength, increased lung function, weight loss, and positive self-image. Yoga's heart health benefits include lowered blood pressure, healthier heart rate, decreased cholesterol and triglyceride levels, improved immune function, and reduced stress. Seek guidance  from your physician and exercise physiologist before implementing an exercise routine and learn your capabilities and proper form for all exercise.  Medical   Aging: Enhancing Your Quality of Life  Clinical staff conducted group or individual video education with verbal and written material and guidebook.  Patient learns key strategies and recommendations to stay in good physical health and enhance quality of life, such as prevention strategies, having an advocate, securing a Health Care Proxy and Power of Attorney, and keeping a list of medications and system for tracking them. It also discusses how to avoid risk for bone loss.  Biology of Weight Control  Clinical staff conducted group or individual video education with verbal and written material and guidebook.  Patient learns that weight gain occurs because we consume more calories than we burn (eating more, moving less). Even if your body weight is normal, you may have higher ratios of fat compared to muscle mass. Too much body fat puts you at increased risk for cardiovascular disease, heart attack, stroke, type 2 diabetes, and obesity-related cancers. In addition to exercise, following the Pritikin Eating Plan can help reduce your risk.  Decoding Lab Results  Clinical staff conducted group or individual video education with verbal and written material and guidebook.  Patient learns that lab test reflects one measurement whose values change over time and are influenced by many factors, including medication, stress, sleep, exercise, food, hydration, pre-existing medical conditions, and more. It is recommended to use the knowledge from this video to become more involved with your lab results and evaluate your numbers to speak with your doctor.   Diseases of Our Time - Overview  Clinical staff conducted group or individual video education with verbal and written material and guidebook.  Patient learns that according to the CDC, 50% to 70% of  chronic diseases (such as obesity, type 2 diabetes, elevated lipids, hypertension, and heart disease) are avoidable through lifestyle improvements including healthier food choices, listening to satiety cues, and increased physical activity.  Sleep Disorders Clinical staff conducted group or individual video education with verbal and written material and guidebook.  Patient learns how good quality and duration of sleep are important to overall health and well-being. Patient also learns about sleep disorders and how they impact health along with recommendations to address them, including discussing with a physician.  Nutrition  Dining Out - Part 2 Clinical staff conducted group or individual video education with verbal and written material and guidebook.  Patient learns how to plan ahead and communicate in order to maximize their dining experience in a healthy and nutritious  manner. Included are recommended food choices based on the type of restaurant the patient is visiting.   Fueling a Banker conducted group or individual video education with verbal and written material and guidebook.  There is a strong connection between our food choices and our health. Diseases like obesity and type 2 diabetes are very prevalent and are in large-part due to lifestyle choices. The Pritikin Eating Plan provides plenty of food and hunger-curbing satisfaction. It is easy to follow, affordable, and helps reduce health risks.  Menu Workshop  Clinical staff conducted group or individual video education with verbal and written material and guidebook.  Patient learns that restaurant meals can sabotage health goals because they are often packed with calories, fat, sodium, and sugar. Recommendations include strategies to plan ahead and to communicate with the manager, chef, or server to help order a healthier meal.  Planning Your Eating Strategy  Clinical staff conducted group or individual video  education with verbal and written material and guidebook.  Patient learns about the Pritikin Eating Plan and its benefit of reducing the risk of disease. The Pritikin Eating Plan does not focus on calories. Instead, it emphasizes high-quality, nutrient-rich foods. By knowing the characteristics of the foods, we choose, we can determine their calorie density and make informed decisions.  Targeting Your Nutrition Priorities  Clinical staff conducted group or individual video education with verbal and written material and guidebook.  Patient learns that lifestyle habits have a tremendous impact on disease risk and progression. This video provides eating and physical activity recommendations based on your personal health goals, such as reducing LDL cholesterol, losing weight, preventing or controlling type 2 diabetes, and reducing high blood pressure.  Vitamins and Minerals  Clinical staff conducted group or individual video education with verbal and written material and guidebook.  Patient learns different ways to obtain key vitamins and minerals, including through a recommended healthy diet. It is important to discuss all supplements you take with your doctor.   Healthy Mind-Set    Smoking Cessation  Clinical staff conducted group or individual video education with verbal and written material and guidebook.  Patient learns that cigarette smoking and tobacco addiction pose a serious health risk which affects millions of people. Stopping smoking will significantly reduce the risk of heart disease, lung disease, and many forms of cancer. Recommended strategies for quitting are covered, including working with your doctor to develop a successful plan.  Culinary   Becoming a Set designer conducted group or individual video education with verbal and written material and guidebook.  Patient learns that cooking at home can be healthy, cost-effective, quick, and puts them in control. Keys to  cooking healthy recipes will include looking at your recipe, assessing your equipment needs, planning ahead, making it simple, choosing cost-effective seasonal ingredients, and limiting the use of added fats, salts, and sugars.  Cooking - Breakfast and Snacks  Clinical staff conducted group or individual video education with verbal and written material and guidebook.  Patient learns how important breakfast is to satiety and nutrition through the entire day. Recommendations include key foods to eat during breakfast to help stabilize blood sugar levels and to prevent overeating at meals later in the day. Planning ahead is also a key component.  Cooking - Educational psychologist conducted group or individual video education with verbal and written material and guidebook.  Patient learns eating strategies to improve overall health, including an approach to cook more  at home. Recommendations include thinking of animal protein as a side on your plate rather than center stage and focusing instead on lower calorie dense options like vegetables, fruits, whole grains, and plant-based proteins, such as beans. Making sauces in large quantities to freeze for later and leaving the skin on your vegetables are also recommended to maximize your experience.  Cooking - Healthy Salads and Dressing Clinical staff conducted group or individual video education with verbal and written material and guidebook.  Patient learns that vegetables, fruits, whole grains, and legumes are the foundations of the Pritikin Eating Plan. Recommendations include how to incorporate each of these in flavorful and healthy salads, and how to create homemade salad dressings. Proper handling of ingredients is also covered. Cooking - Soups and [employer] - Soups and Desserts Clinical staff conducted group or individual video education with verbal and written material and guidebook.  Patient learns that Pritikin soups and desserts  make for easy, nutritious, and delicious snacks and meal components that are low in sodium, fat, sugar, and calorie density, while high in vitamins, minerals, and filling fiber. Recommendations include simple and healthy ideas for soups and desserts.   Overview     The Pritikin Solution Program Overview Clinical staff conducted group or individual video education with verbal and written material and guidebook.  Patient learns that the results of the Pritikin Program have been documented in more than 100 articles published in peer-reviewed journals, and the benefits include reducing risk factors for (and, in some cases, even reversing) high cholesterol, high blood pressure, type 2 diabetes, obesity, and more! An overview of the three key pillars of the Pritikin Program will be covered: eating well, doing regular exercise, and having a healthy mind-set.  WORKSHOPS  Exercise: Exercise Basics: Building Your Action Plan Clinical staff led group instruction and group discussion with PowerPoint presentation and patient guidebook. To enhance the learning environment the use of posters, models and videos may be added. At the conclusion of this workshop, patients will comprehend the difference between physical activity and exercise, as well as the benefits of incorporating both, into their routine. Patients will understand the FITT (Frequency, Intensity, Time, and Type) principle and how to use it to build an exercise action plan. In addition, safety concerns and other considerations for exercise and cardiac rehab will be addressed by the presenter. The purpose of this lesson is to promote a comprehensive and effective weekly exercise routine in order to improve patients' overall level of fitness.   Managing Heart Disease: Your Path to a Healthier Heart Clinical staff led group instruction and group discussion with PowerPoint presentation and patient guidebook. To enhance the learning environment the use  of posters, models and videos may be added.At the conclusion of this workshop, patients will understand the anatomy and physiology of the heart. Additionally, they will understand how Pritikin's three pillars impact the risk factors, the progression, and the management of heart disease.  The purpose of this lesson is to provide a high-level overview of the heart, heart disease, and how the Pritikin lifestyle positively impacts risk factors.  Exercise Biomechanics Clinical staff led group instruction and group discussion with PowerPoint presentation and patient guidebook. To enhance the learning environment the use of posters, models and videos may be added. Patients will learn how the structural parts of their bodies function and how these functions impact their daily activities, movement, and exercise. Patients will learn how to promote a neutral spine, learn how to manage pain, and identify  ways to improve their physical movement in order to promote healthy living. The purpose of this lesson is to expose patients to common physical limitations that impact physical activity. Participants will learn practical ways to adapt and manage aches and pains, and to minimize their effect on regular exercise. Patients will learn how to maintain good posture while sitting, walking, and lifting.  Balance Training and Fall Prevention  Clinical staff led group instruction and group discussion with PowerPoint presentation and patient guidebook. To enhance the learning environment the use of posters, models and videos may be added. At the conclusion of this workshop, patients will understand the importance of their sensorimotor skills (vision, proprioception, and the vestibular system) in maintaining their ability to balance as they age. Patients will apply a variety of balancing exercises that are appropriate for their current level of function. Patients will understand the common causes for poor balance,  possible solutions to these problems, and ways to modify their physical environment in order to minimize their fall risk. The purpose of this lesson is to teach patients about the importance of maintaining balance as they age and ways to minimize their risk of falling.  WORKSHOPS   Nutrition:  Fueling a Ship broker led group instruction and group discussion with PowerPoint presentation and patient guidebook. To enhance the learning environment the use of posters, models and videos may be added. Patients will review the foundational principles of the Pritikin Eating Plan and understand what constitutes a serving size in each of the food groups. Patients will also learn Pritikin-friendly foods that are better choices when away from home and review make-ahead meal and snack options. Calorie density will be reviewed and applied to three nutrition priorities: weight maintenance, weight loss, and weight gain. The purpose of this lesson is to reinforce (in a group setting) the key concepts around what patients are recommended to eat and how to apply these guidelines when away from home by planning and selecting Pritikin-friendly options. Patients will understand how calorie density may be adjusted for different weight management goals.  Mindful Eating  Clinical staff led group instruction and group discussion with PowerPoint presentation and patient guidebook. To enhance the learning environment the use of posters, models and videos may be added. Patients will briefly review the concepts of the Pritikin Eating Plan and the importance of low-calorie dense foods. The concept of mindful eating will be introduced as well as the importance of paying attention to internal hunger signals. Triggers for non-hunger eating and techniques for dealing with triggers will be explored. The purpose of this lesson is to provide patients with the opportunity to review the basic principles of the Pritikin Eating  Plan, discuss the value of eating mindfully and how to measure internal cues of hunger and fullness using the Hunger Scale. Patients will also discuss reasons for non-hunger eating and learn strategies to use for controlling emotional eating.  Targeting Your Nutrition Priorities Clinical staff led group instruction and group discussion with PowerPoint presentation and patient guidebook. To enhance the learning environment the use of posters, models and videos may be added. Patients will learn how to determine their genetic susceptibility to disease by reviewing their family history. Patients will gain insight into the importance of diet as part of an overall healthy lifestyle in mitigating the impact of genetics and other environmental insults. The purpose of this lesson is to provide patients with the opportunity to assess their personal nutrition priorities by looking at their family history, their own  health history and current risk factors. Patients will also be able to discuss ways of prioritizing and modifying the Pritikin Eating Plan for their highest risk areas  Menu  Clinical staff led group instruction and group discussion with PowerPoint presentation and patient guidebook. To enhance the learning environment the use of posters, models and videos may be added. Using menus brought in from [employer], or printed from [employer], patients will apply the Pritikin dining out guidelines that were presented in the [employer] video. Patients will also be able to practice these guidelines in a variety of provided scenarios. The purpose of this lesson is to provide patients with the opportunity to practice hands-on learning of the Pritikin Dining Out guidelines with actual menus and practice scenarios.  Label Reading Clinical staff led group instruction and group discussion with PowerPoint presentation and patient guidebook. To enhance the learning environment the use of  posters, models and videos may be added. Patients will review and discuss the Pritikin label reading guidelines presented in Pritikin's Label Reading Educational series video. Using fool labels brought in from local grocery stores and markets, patients will apply the label reading guidelines and determine if the packaged food meet the Pritikin guidelines. The purpose of this lesson is to provide patients with the opportunity to review, discuss, and practice hands-on learning of the Pritikin Label Reading guidelines with actual packaged food labels. Cooking School  Pritikin's [employer] are designed to teach patients ways to prepare quick, simple, and affordable recipes at home. The importance of nutrition's role in chronic disease risk reduction is reflected in its emphasis in the overall Pritikin program. By learning how to prepare essential core Pritikin Eating Plan recipes, patients will increase control over what they eat; be able to customize the flavor of foods without the use of added salt, sugar, or fat; and improve the quality of the food they consume. By learning a set of core recipes which are easily assembled, quickly prepared, and affordable, patients are more likely to prepare more healthy foods at home. These workshops focus on convenient breakfasts, simple entres, side dishes, and desserts which can be prepared with minimal effort and are consistent with nutrition recommendations for cardiovascular risk reduction. Cooking [employer] are taught by a Armed forces logistics/support/administrative officer (RD) who has been trained by the [employer]. The chef or RD has a clear understanding of the importance of minimizing - if not completely eliminating - added fat, sugar, and sodium in recipes. Throughout the series of Cooking School Workshop sessions, patients will learn about healthy ingredients and efficient methods of cooking to build confidence in their capability to  prepare    Cooking School weekly topics:  Adding Flavor- Sodium-Free  Fast and Healthy Breakfasts  Powerhouse Plant-Based Proteins  Satisfying Salads and Dressings  Simple Sides and Sauces  International Cuisine-Spotlight on the [employer] Zones  Delicious Desserts  Savory Soups  [employer] - Meals in a Astronomer Appetizers and Snacks  Comforting Weekend Breakfasts  One-Pot Wonders   Fast Evening Meals  Landscape architect Your Pritikin Plate  WORKSHOPS   Healthy Mindset (Psychosocial):  Focused Goals, Sustainable Changes Clinical staff led group instruction and group discussion with PowerPoint presentation and patient guidebook. To enhance the learning environment the use of posters, models and videos may be added. Patients will be able to apply effective goal setting strategies to establish at least one personal goal, and then take consistent, meaningful action toward  that goal. They will learn to identify common barriers to achieving personal goals and develop strategies to overcome them. Patients will also gain an understanding of how our mind-set can impact our ability to achieve goals and the importance of cultivating a positive and growth-oriented mind-set. The purpose of this lesson is to provide patients with a deeper understanding of how to set and achieve personal goals, as well as the tools and strategies needed to overcome common obstacles which may arise along the way.  From Head to Heart: The Power of a Healthy Outlook  Clinical staff led group instruction and group discussion with PowerPoint presentation and patient guidebook. To enhance the learning environment the use of posters, models and videos may be added. Patients will be able to recognize and describe the impact of emotions and mood on physical health. They will discover the importance of self-care and explore self-care practices which may work for them. Patients will also learn how to utilize  the 4 C's to cultivate a healthier outlook and better manage stress and challenges. The purpose of this lesson is to demonstrate to patients how a healthy outlook is an essential part of maintaining good health, especially as they continue their cardiac rehab journey.  Healthy Sleep for a Healthy Heart Clinical staff led group instruction and group discussion with PowerPoint presentation and patient guidebook. To enhance the learning environment the use of posters, models and videos may be added. At the conclusion of this workshop, patients will be able to demonstrate knowledge of the importance of sleep to overall health, well-being, and quality of life. They will understand the symptoms of, and treatments for, common sleep disorders. Patients will also be able to identify daytime and nighttime behaviors which impact sleep, and they will be able to apply these tools to help manage sleep-related challenges. The purpose of this lesson is to provide patients with a general overview of sleep and outline the importance of quality sleep. Patients will learn about a few of the most common sleep disorders. Patients will also be introduced to the concept of "sleep hygiene," and discover ways to self-manage certain sleeping problems through simple daily behavior changes. Finally, the workshop will motivate patients by clarifying the links between quality sleep and their goals of heart-healthy living.   Recognizing and Reducing Stress Clinical staff led group instruction and group discussion with PowerPoint presentation and patient guidebook. To enhance the learning environment the use of posters, models and videos may be added. At the conclusion of this workshop, patients will be able to understand the types of stress reactions, differentiate between acute and chronic stress, and recognize the impact that chronic stress has on their health. They will also be able to apply different coping mechanisms, such as reframing  negative self-talk. Patients will have the opportunity to practice a variety of stress management techniques, such as deep abdominal breathing, progressive muscle relaxation, and/or guided imagery.  The purpose of this lesson is to educate patients on the role of stress in their lives and to provide healthy techniques for coping with it.  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   Learning Preferences Audio;Skilled Demonstration          Education Topics:  Knowledge Questionnaire Score:  Knowledge Questionnaire Score - 01/11/24 1014       Knowledge Questionnaire Score   Pre Score 23/24  Core Components/Risk Factors/Patient Goals at Admission:  Personal Goals and Risk Factors at Admission - 01/11/24 1429       Core Components/Risk Factors/Patient Goals on Admission    Weight Management Weight Maintenance    Intervention Weight Management: Develop a combined nutrition and exercise program designed to reach desired caloric intake, while maintaining appropriate intake of nutrient and fiber, sodium and fats, and appropriate energy expenditure required for the weight goal.;Weight Management: Provide education and appropriate resources to help participant work on and attain dietary goals.    Expected Outcomes Weight Maintenance: Understanding of the daily nutrition guidelines, which includes 25-35% calories from fat, 7% or less cal from saturated fats, less than 200mg  cholesterol, less than 1.5gm of sodium, & 5 or more servings of fruits and vegetables daily;Long Term: Adherence to nutrition and physical activity/exercise program aimed toward attainment of established weight goal;Short Term: Continue to assess and modify interventions until short term weight is achieved    Heart Failure Yes    Intervention Provide a combined exercise and  nutrition program that is supplemented with education, support and counseling about heart failure. Directed toward relieving symptoms such as shortness of breath, decreased exercise tolerance, and extremity edema.    Expected Outcomes Improve functional capacity of life;Short term: Attendance in program 2-3 days a week with increased exercise capacity. Reported lower sodium intake. Reported increased fruit and vegetable intake. Reports medication compliance.;Short term: Daily weights obtained and reported for increase. Utilizing diuretic protocols set by physician.;Long term: Adoption of self-care skills and reduction of barriers for early signs and symptoms recognition and intervention leading to self-care maintenance.    Intervention Provide education on lifestyle modifcations including regular physical activity/exercise, weight management, moderate sodium restriction and increased consumption of fresh fruit, vegetables, and low fat dairy, alcohol  moderation, and smoking cessation.;Monitor prescription use compliance.    Expected Outcomes Short Term: Continued assessment and intervention until BP is < 140/2mm HG in hypertensive participants. < 130/29mm HG in hypertensive participants with diabetes, heart failure or chronic kidney disease.;Long Term: Maintenance of blood pressure at goal levels.    Intervention Provide education and support for participant on nutrition & aerobic/resistive exercise along with prescribed medications to achieve LDL 70mg , HDL >40mg .    Expected Outcomes Short Term: Participant states understanding of desired cholesterol values and is compliant with medications prescribed. Participant is following exercise prescription and nutrition guidelines.;Long Term: Cholesterol controlled with medications as prescribed, with individualized exercise RX and with personalized nutrition plan. Value goals: LDL < 70mg , HDL > 40 mg.    Stress Yes    Intervention Offer individual and/or small  group education and counseling on adjustment to heart disease, stress management and health-related lifestyle change. Teach and support self-help strategies.;Refer participants experiencing significant psychosocial distress to appropriate mental health specialists for further evaluation and treatment. When possible, include family members and significant others in education/counseling sessions.    Expected Outcomes Short Term: Participant demonstrates changes in health-related behavior, relaxation and other stress management skills, ability to obtain effective social support, and compliance with psychotropic medications if prescribed.;Long Term: Emotional wellbeing is indicated by absence of clinically significant psychosocial distress or social isolation.          Core Components/Risk Factors/Patient Goals Review:   Goals and Risk Factor Review     Row Name 01/24/24 1712 01/30/24 1513           Core Components/Risk Factors/Patient Goals Review   Personal Goals Review Weight Management/Obesity;Heart Failure;Lipids;Hypertension;Stress Weight Management/Obesity;Heart Failure;Lipids;Hypertension;Stress  Review Connie [name] started cardiac rehab on 01/24/24. Connie [name] did well with exercise. vital signs were stable. Connie [name] uses a cane for stability. Connie [name] started cardiac rehab on 01/24/24. Connie [name] is off to a good start with exercise. vital signs have been stable. Connie [name] uses a cane for stability.      Expected Outcomes Connie [name] will continue to participate in cardiac rehab for exercise, nutrition and lifestyle modifications Connie [name] will continue to participate in cardiac rehab for exercise, nutrition and lifestyle modifications         Core Components/Risk Factors/Patient Goals at Discharge (Final Review):   Goals and Risk Factor Review - 01/30/24 1513       Core Components/Risk Factors/Patient Goals Review   Personal Goals Review Weight Management/Obesity;Heart Failure;Lipids;Hypertension;Stress    Review Connie [name]  started cardiac rehab on 01/24/24. Connie [name] is off to a good start with exercise. vital signs have been stable. Connie [name] uses a cane for stability.    Expected Outcomes Connie [name] will continue to participate in cardiac rehab for exercise, nutrition and lifestyle modifications          ITP Comments:  ITP Comments     Row Name 01/24/24 1131 01/30/24 1512         ITP Comments Connie [name] started cardiac rehab and tolerated the equipment and stretching routine well. 30 day ITP Review. Connie [name] started cardiac rehab on 01/24/24         Comments: See ITP Comments

## 2024-02-07 ENCOUNTER — Encounter (HOSPITAL_COMMUNITY)
Admission: RE | Admit: 2024-02-07 | Discharge: 2024-02-07 | Disposition: A | Discharge status: Home / Self Care | Source: Ambulatory Visit | Attending: Cardiology | Admitting: Cardiology

## 2024-02-07 VITALS — BP 118/62 | HR 66 | Wt 144.6 lb

## 2024-02-07 DIAGNOSIS — I213 ST elevation (STEMI) myocardial infarction of unspecified site: Secondary | ICD-10-CM | POA: Diagnosis not present

## 2024-02-07 DIAGNOSIS — Z955 Presence of coronary angioplasty implant and graft: Secondary | ICD-10-CM | POA: Diagnosis not present

## 2024-02-07 DIAGNOSIS — I5042 Chronic combined systolic (congestive) and diastolic (congestive) heart failure: Secondary | ICD-10-CM | POA: Diagnosis not present

## 2024-02-09 ENCOUNTER — Encounter (HOSPITAL_COMMUNITY)

## 2024-02-12 ENCOUNTER — Encounter (HOSPITAL_COMMUNITY): Admission: RE | Admit: 2024-02-12 | Source: Ambulatory Visit

## 2024-02-12 ENCOUNTER — Telehealth (HOSPITAL_COMMUNITY): Payer: Self-pay | Admitting: *Deleted

## 2024-02-12 NOTE — Progress Notes (Signed)
 Discharge Progress Report  Patient Details  Name: Connie [name] MRN: [id number] Date of Birth: [birth year] Referring Provider:   Flowsheet Row INTENSIVE CARDIAC REHAB ORIENT from 01/11/2024 in [facility]  Referring Provider [name], MD     Number of Visits: 6  Reason for Discharge:  Early Exit:  shoulder problems  Smoking History:  Social History   Tobacco Use  Smoking Status Never   Passive exposure: Never  Smokeless Tobacco Never    Diagnosis:  Heart failure, systolic and diastolic, chronic (HCC)  ADL [facility]:   Initial Exercise Prescription:  Initial Exercise Prescription - 01/11/24 1000       Date of Initial Exercise RX and Referring Provider   Date 01/11/24    Referring Provider [name], MD    Expected Discharge Date 04/03/24      NuStep   Level 1    SPM 70    Minutes 30    METs 1.7      Prescription Details   Frequency (times per week) 2    Duration Progress to 30 minutes of continuous aerobic without signs/symptoms of physical distress      Intensity   THRR 40-80% of Max Heartrate 54-109    Ratings of Perceived Exertion 11-13    Perceived Dyspnea 0-4      Progression   Progression Continue progressive overload as per policy without signs/symptoms or physical distress.      Resistance Training   Training Prescription Yes    Weight 2 lbs    Reps 10-15          Discharge Exercise Prescription (Final Exercise Prescription Changes):  Exercise Prescription Changes - 02/07/24 1030       Response to Exercise   Blood Pressure (Admit) 118/62    Blood Pressure (Exercise) 126/60    Blood Pressure (Exit) 118/72    Heart Rate (Admit) 66 bpm    Heart Rate (Exercise) 109 bpm    Heart Rate (Exit) 60 bpm    Rating of Perceived Exertion (Exercise) 9    Symptoms None    Duration Progress to 30 minutes of  aerobic without signs/symptoms of physical distress     Intensity THRR unchanged      Progression   Progression Continue to progress workloads to maintain intensity without signs/symptoms of physical distress.    Average METs 2.2      Resistance Training   Training Prescription No    Weight Relaxation day, no weights.      Interval Training   Interval Training No      NuStep   Level 1    SPM 63    Minutes 27    METs 2.2          Functional Capacity:  6 Minute Walk     Row Name 01/11/24 1414         6 Minute Walk   Phase Initial  Used Rollator     Distance 925 feet     Walk Time 6 minutes     # of Rest Breaks 0     MPH 1.75     METS 1.7     RPE 11     Perceived Dyspnea  0     VO2 Peak 5.84     Symptoms No     Resting HR 61 bpm     Resting BP 140/66     Resting Oxygen Saturation  97 %     Exercise Oxygen Saturation  during 6 min walk 97 %     Max Ex. HR 111 bpm     Max Ex. BP 148/76     2 Minute Post BP 128/70        Psychological, QOL, Others - Outcomes: PHQ 2/9:    01/11/2024   10:07 AM 08/23/2022    2:03 PM  Depression screen PHQ 2/9  Decreased Interest 0 1  Down, Depressed, Hopeless 1 1  PHQ - 2 Score 1 2  Altered sleeping 0 0  Tired, decreased energy 1 1  Change in appetite 0 0  Feeling bad or failure about yourself  1   Trouble concentrating 0 1  Moving slowly or fidgety/restless 0 0  Suicidal thoughts 0 0  PHQ-9 Score 3 4  Difficult doing work/chores Somewhat difficult Somewhat difficult    Quality of Life:  Quality of Life - 01/11/24 1428       Quality of Life   Select Quality of Life      Quality of Life Scores   Health/Function Pre 23.04 %    Socioeconomic Pre 24.67 %    Psych/Spiritual Pre 25.07 %    Family Pre 30 %    GLOBAL Pre 24.71 %          Personal Goals: Goals established at orientation with interventions provided to work toward goal.  Personal Goals and Risk Factors at Admission - 01/11/24 1429       Core Components/Risk Factors/Patient Goals on Admission     Weight Management Weight Maintenance    Intervention Weight Management: Develop a combined nutrition and exercise program designed to reach desired caloric intake, while maintaining appropriate intake of nutrient and fiber, sodium and fats, and appropriate energy expenditure required for the weight goal.;Weight Management: Provide education and appropriate resources to help participant work on and attain dietary goals.    Expected Outcomes Weight Maintenance: Understanding of the daily nutrition guidelines, which includes 25-35% calories from fat, 7% or less cal from saturated fats, less than 200mg  cholesterol, less than 1.5gm of sodium, & 5 or more servings of fruits and vegetables daily;Long Term: Adherence to nutrition and physical activity/exercise program aimed toward attainment of established weight goal;Short Term: Continue to assess and modify interventions until short term weight is achieved    Heart Failure Yes    Intervention Provide a combined exercise and nutrition program that is supplemented with education, support and counseling about heart failure. Directed toward relieving symptoms such as shortness of breath, decreased exercise tolerance, and extremity edema.    Expected Outcomes Improve functional capacity of life;Short term: Attendance in program 2-3 days a week with increased exercise capacity. Reported lower sodium intake. Reported increased fruit and vegetable intake. Reports medication compliance.;Short term: Daily weights obtained and reported for increase. Utilizing diuretic protocols set by physician.;Long term: Adoption of self-care skills and reduction of barriers for early signs and symptoms recognition and intervention leading to self-care maintenance.    Intervention Provide education on lifestyle modifcations including regular physical activity/exercise, weight management, moderate sodium restriction and increased consumption of fresh fruit, vegetables, and low fat dairy,  alcohol  moderation, and smoking cessation.;Monitor prescription use compliance.    Expected Outcomes Short Term: Continued assessment and intervention until BP is < 140/63mm HG in hypertensive participants. < 130/72mm HG in hypertensive participants with diabetes, heart failure or chronic kidney disease.;Long Term: Maintenance of blood pressure at goal levels.    Intervention Provide education  and support for participant on nutrition & aerobic/resistive exercise along with prescribed medications to achieve LDL 70mg , HDL >40mg .    Expected Outcomes Short Term: Participant states understanding of desired cholesterol values and is compliant with medications prescribed. Participant is following exercise prescription and nutrition guidelines.;Long Term: Cholesterol controlled with medications as prescribed, with individualized exercise RX and with personalized nutrition plan. Value goals: LDL < 70mg , HDL > 40 mg.    Stress Yes    Intervention Offer individual and/or small group education and counseling on adjustment to heart disease, stress management and health-related lifestyle change. Teach and support self-help strategies.;Refer participants experiencing significant psychosocial distress to appropriate mental health specialists for further evaluation and treatment. When possible, include family members and significant others in education/counseling sessions.    Expected Outcomes Short Term: Participant demonstrates changes in health-related behavior, relaxation and other stress management skills, ability to obtain effective social support, and compliance with psychotropic medications if prescribed.;Long Term: Emotional wellbeing is indicated by absence of clinically significant psychosocial distress or social isolation.           Personal Goals Discharge:  Goals and Risk Factor Review     Row Name 01/24/24 1712 01/30/24 1513           Core Components/Risk Factors/Patient Goals Review   Personal  Goals Review Weight Management/Obesity;Heart Failure;Lipids;Hypertension;Stress Weight Management/Obesity;Heart Failure;Lipids;Hypertension;Stress      Review Connie [name] started cardiac rehab on 01/24/24. Connie [name] did well with exercise. vital signs were stable. Connie [name] uses a cane for stability. Connie [name] started cardiac rehab on 01/24/24. Connie [name] is off to a good start with exercise. vital signs have been stable. Connie [name] uses a cane for stability.      Expected Outcomes Connie [name] will continue to participate in cardiac rehab for exercise, nutrition and lifestyle modifications Connie [name] will continue to participate in cardiac rehab for exercise, nutrition and lifestyle modifications         Exercise Goals and Review:  Exercise Goals     Row Name 01/11/24 1015             Exercise Goals   Increase Physical Activity Yes       Intervention Provide advice, education, support and counseling about physical activity/exercise needs.;Develop an individualized exercise prescription for aerobic and resistive training based on initial evaluation findings, risk stratification, comorbidities and participant's personal goals.       Expected Outcomes Short Term: Attend rehab on a regular basis to increase amount of physical activity.;Long Term: Add in home exercise to make exercise part of routine and to increase amount of physical activity.;Long Term: Exercising regularly at least 3-5 days a week.       Increase Strength and Stamina Yes       Intervention Provide advice, education, support and counseling about physical activity/exercise needs.;Develop an individualized exercise prescription for aerobic and resistive training based on initial evaluation findings, risk stratification, comorbidities and participant's personal goals.       Expected Outcomes Long Term: Improve cardiorespiratory fitness, muscular endurance and strength as measured by increased METs and functional capacity ( );Short Term: Perform resistance training exercises  routinely during rehab and add in resistance training at home;Short Term: Increase workloads from initial exercise prescription for resistance, speed, and METs.       Able to understand and use rate of perceived exertion (RPE) scale Yes       Intervention Provide education and explanation on how to use RPE scale       Expected Outcomes Short Term:  Able to use RPE daily in rehab to express subjective intensity level;Long Term:  Able to use RPE to guide intensity level when exercising independently       Knowledge and understanding of Target Heart Rate Range (THRR) Yes       Intervention Provide education and explanation of THRR including how the numbers were predicted and where they are located for reference       Expected Outcomes Short Term: Able to state/look up THRR;Long Term: Able to use THRR to govern intensity when exercising independently;Short Term: Able to use daily as guideline for intensity in rehab       Able to check pulse independently Yes       Intervention Provide education and demonstration on how to check pulse in carotid and radial arteries.;Review the importance of being able to check your own pulse for safety during independent exercise       Expected Outcomes Long Term: Able to check pulse independently and accurately;Short Term: Able to explain why pulse checking is important during independent exercise       Understanding of Exercise Prescription Yes       Intervention Provide education, explanation, and written materials on patient's individual exercise prescription       Expected Outcomes Short Term: Able to explain program exercise prescription;Long Term: Able to explain home exercise prescription to exercise independently          Exercise Goals Re-Evaluation:  Exercise Goals Re-Evaluation     Row Name 01/24/24 1131 02/23/24 0742           Exercise Goal Re-Evaluation   Exercise Goals Review Increase Physical Activity;Increase Strength and Stamina;Able to  understand and use rate of perceived exertion (RPE) scale --      Comments Connie [name] was able to understand and use RPE scale appropriately. Connie [name] discharged from the cardiac rehab due to shoulder problems she was having.      Expected Outcomes Progress workloads as tolerated to help increase strength and stamina. --         Nutrition & Weight - Outcomes:  Pre Biometrics - 01/11/24 0948       Pre Biometrics   Waist Circumference 37.5 inches    Hip Circumference 41 inches    Waist to Hip Ratio 0.91 %    Triceps Skinfold 19 mm    % Body Fat 38.9 %    Grip Strength 14 kg    Flexibility 0 in   could not reach   Single Leg Stand 1 seconds           Nutrition:  Nutrition Therapy & Goals - 02/12/24 1235       Nutrition Therapy   Diet Heart healthy diet    Drug/Food Interactions Statins/Certain Fruits      Personal Nutrition Goals   Nutrition Goal Patient to identify strategies for reducing cardiovascular risk by attending the [employer] education and nutrition series weekly.   goal not met.   Personal Goal #2 Patient to improve diet quality by using the plate method as a guide for meal planning to include lean protein/plant protein, fruits, vegetables, whole grains, nonfat dairy as part of a well-balanced diet.    Comments Patient decided she no longer wanted to participate in cardiac rehab due to shoulder pain. She completed 4 exercise sessions and no education sessions. 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 She was previously referred to cardiac rehab in 2024 but never attended and  was discharged from the program. Lipids are at goal. Patient would benefit from participation in intensive cardiac rehab for nutrition education, exercise, and lifestyle modification.      Intervention Plan   Intervention Prescribe, educate and counsel regarding individualized specific dietary modifications aiming towards targeted core components such as weight,  hypertension, lipid management, diabetes, heart failure and other comorbidities.;Nutrition handout(s) given to patient.    Expected Outcomes Short Term Goal: Understand basic principles of dietary content, such as calories, fat, sodium, cholesterol and nutrients.;Long Term Goal: Adherence to prescribed nutrition plan.          Nutrition Discharge:  Nutrition Assessments - 01/29/24 1035       Rate Your Plate Scores   Pre Score 73          Education Questionnaire Score:  Knowledge Questionnaire Score - 01/11/24 1014       Knowledge Questionnaire Score   Pre Score 23/24          Connie [name] attended 5 exercise and 1 education class between 01/11/24- 02/07/24. Connie [name] enjoyed participating in the program while enrolled. Connie [name] stopped due to shoulder problems. Hadassah Elpidio Quan RN BSN

## 2024-02-12 NOTE — Telephone Encounter (Signed)
 Spoke with Connie [name]. Connie [name] has been having problem's with her shoulder and says its getting worse. Connie [name] wants to stop participation in cardiac rehab at this time. Will cancel appointments at this time.  Connie [name] says she has enjoyed participating in this program and is appreciative of all who worked with her. Connie [name] says she does not plan to return and will do her home exercises.Hadassah Elpidio Quan RN BSN

## 2024-02-14 ENCOUNTER — Encounter (HOSPITAL_COMMUNITY)

## 2024-02-16 ENCOUNTER — Encounter (HOSPITAL_COMMUNITY)

## 2024-02-19 ENCOUNTER — Encounter (HOSPITAL_COMMUNITY)

## 2024-02-21 ENCOUNTER — Encounter (HOSPITAL_COMMUNITY)

## 2024-02-23 ENCOUNTER — Encounter (HOSPITAL_COMMUNITY)

## 2024-02-26 ENCOUNTER — Encounter (HOSPITAL_COMMUNITY)

## 2024-02-26 NOTE — Addendum Note (Signed)
 Encounter addended by: Cyrus Hadassah ORN, RN on: 02/26/2024 8:47 AM  Actions taken: Clinical Note Signed, Episode resolved

## 2024-02-28 ENCOUNTER — Encounter (HOSPITAL_COMMUNITY)

## 2024-03-01 ENCOUNTER — Encounter (HOSPITAL_COMMUNITY)

## 2024-03-04 ENCOUNTER — Encounter (HOSPITAL_COMMUNITY)

## 2024-03-06 ENCOUNTER — Ambulatory Visit: Payer: Medicare PPO

## 2024-03-06 ENCOUNTER — Encounter (HOSPITAL_COMMUNITY)

## 2024-03-06 ENCOUNTER — Encounter: Payer: Medicare PPO | Attending: Cardiology

## 2024-03-06 DIAGNOSIS — I255 Ischemic cardiomyopathy: Secondary | ICD-10-CM

## 2024-03-06 DIAGNOSIS — Z955 Presence of coronary angioplasty implant and graft: Secondary | ICD-10-CM | POA: Insufficient documentation

## 2024-03-06 DIAGNOSIS — I5042 Chronic combined systolic (congestive) and diastolic (congestive) heart failure: Secondary | ICD-10-CM | POA: Insufficient documentation

## 2024-03-06 DIAGNOSIS — I213 ST elevation (STEMI) myocardial infarction of unspecified site: Secondary | ICD-10-CM | POA: Insufficient documentation

## 2024-03-07 LAB — CUP PACEART REMOTE DEVICE CHECK
Battery Remaining Longevity: 144 mo
Battery Voltage: 3.03 V
Brady Statistic AP VP Percent: 0.02 %
Brady Statistic AP VS Percent: 64.87 %
Brady Statistic AS VP Percent: 0.02 %
Brady Statistic AS VS Percent: 35.09 %
Brady Statistic RA Percent Paced: 64.96 %
Brady Statistic RV Percent Paced: 0.04 %
Date Time Interrogation Session: 20250812230151
Implantable Lead Connection Status: 753985
Implantable Lead Connection Status: 753985
Implantable Lead Implant Date: 20231103
Implantable Lead Implant Date: 20231103
Implantable Lead Location: 753859
Implantable Lead Location: 753860
Implantable Lead Model: 3830
Implantable Lead Model: 5076
Implantable Pulse Generator Implant Date: 20231103
Lead Channel Impedance Value: 285 Ohm
Lead Channel Impedance Value: 342 Ohm
Lead Channel Impedance Value: 399 Ohm
Lead Channel Impedance Value: 475 Ohm
Lead Channel Pacing Threshold Amplitude: 0.5 V
Lead Channel Pacing Threshold Amplitude: 1 V
Lead Channel Pacing Threshold Pulse Width: 0.4 ms
Lead Channel Pacing Threshold Pulse Width: 0.4 ms
Lead Channel Sensing Intrinsic Amplitude: 1.375 mV
Lead Channel Sensing Intrinsic Amplitude: 1.375 mV
Lead Channel Sensing Intrinsic Amplitude: 5.625 mV
Lead Channel Sensing Intrinsic Amplitude: 5.625 mV
Lead Channel Setting Pacing Amplitude: 1.5 V
Lead Channel Setting Pacing Amplitude: 2 V
Lead Channel Setting Pacing Pulse Width: 0.4 ms
Lead Channel Setting Sensing Sensitivity: 0.6 mV
Zone Setting Status: 755011

## 2024-03-08 ENCOUNTER — Encounter (HOSPITAL_COMMUNITY)

## 2024-03-08 DIAGNOSIS — I480 Paroxysmal atrial fibrillation: Secondary | ICD-10-CM | POA: Diagnosis not present

## 2024-03-08 DIAGNOSIS — L4050 Arthropathic psoriasis, unspecified: Secondary | ICD-10-CM | POA: Diagnosis not present

## 2024-03-08 DIAGNOSIS — Z0000 Encounter for general adult medical examination without abnormal findings: Secondary | ICD-10-CM | POA: Diagnosis not present

## 2024-03-08 DIAGNOSIS — K219 Gastro-esophageal reflux disease without esophagitis: Secondary | ICD-10-CM | POA: Diagnosis not present

## 2024-03-08 DIAGNOSIS — F321 Major depressive disorder, single episode, moderate: Secondary | ICD-10-CM | POA: Diagnosis not present

## 2024-03-08 DIAGNOSIS — E7800 Pure hypercholesterolemia, unspecified: Secondary | ICD-10-CM | POA: Diagnosis not present

## 2024-03-08 DIAGNOSIS — R29818 Other symptoms and signs involving the nervous system: Secondary | ICD-10-CM | POA: Diagnosis not present

## 2024-03-08 DIAGNOSIS — R2681 Unsteadiness on feet: Secondary | ICD-10-CM | POA: Diagnosis not present

## 2024-03-08 DIAGNOSIS — E039 Hypothyroidism, unspecified: Secondary | ICD-10-CM | POA: Diagnosis not present

## 2024-03-08 DIAGNOSIS — I10 Essential (primary) hypertension: Secondary | ICD-10-CM | POA: Diagnosis not present

## 2024-03-10 ENCOUNTER — Ambulatory Visit: Payer: Self-pay | Admitting: Cardiovascular Disease

## 2024-03-11 ENCOUNTER — Encounter (HOSPITAL_COMMUNITY)

## 2024-03-13 ENCOUNTER — Encounter (HOSPITAL_COMMUNITY)

## 2024-03-15 ENCOUNTER — Encounter (HOSPITAL_COMMUNITY)

## 2024-03-18 ENCOUNTER — Encounter (HOSPITAL_COMMUNITY)

## 2024-03-19 NOTE — Therapy (Signed)
 OUTPATIENT PHYSICAL THERAPY NEURO EVALUATION   Patient Name: Connie [name] MRN: [id number] DOB:1941-05-20, 83 y.o., female Today's Date: 03/20/2024   PCP: [name], MD REFERRING PROVIDER: [name], MD  END OF SESSION:  PT End of Session - 03/20/24 1039     Visit Number 1    Number of Visits 13    Date for PT Re-Evaluation 05/01/24    Authorization Type [employer]    Authorization Time Period auth submitted    PT Start Time [phone]    PT Stop Time 1018    PT Time Calculation (min) 41 min    Activity Tolerance Patient tolerated treatment well    Behavior During Therapy [facility] for tasks assessed/performed          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   Multiple, noted on US    History of hiatal hernia    Hypertension    Hypothyroidism    Macular degeneration    Psoriatic arthritis (HCC)    Sinus drainage    UTI (urinary tract infection)    Past Surgical History:  Procedure Laterality Date   BACK SURGERY  2000   Dr Alix   bladder tack  2005   COLONOSCOPY     CORONARY ANGIOPLASTY     CORONARY STENT INTERVENTION N/A 04/11/2022   Procedure: CORONARY STENT INTERVENTION;  Surgeon: [name], MD;  Location: [facility] INVASIVE CV LAB;  Service: Cardiovascular;  Laterality: N/A;   DILATION AND CURETTAGE OF UTERUS     after miscarriage   FALSE ANEURYSM REPAIR Right 11/09/2022   Procedure: 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;  Surgeon: [name], MD;  Location: [facility] OR;  Service: Vascular;  Laterality: Right;   INSERT / REPLACE / REMOVE PACEMAKER     LEFT HEART CATH AND CORONARY ANGIOGRAPHY N/A 04/07/2022   Procedure: LEFT HEART CATH AND CORONARY ANGIOGRAPHY;  Surgeon: [name], MD;  Location: [facility] INVASIVE CV LAB;  Service: Cardiovascular;  Laterality: N/A;   PACEMAKER IMPLANT  N/A 05/27/2022   Procedure: PACEMAKER IMPLANT;  Surgeon: [name], MD;  Location: [facility] INVASIVE CV LAB;  Service: Cardiovascular;  Laterality: N/A;   RECTAL PROLAPSE REPAIR  2008   SCAR REVISION Right 2017   Knee, Dr. Yvone   TONSILLECTOMY     as a child   TOTAL KNEE ARTHROPLASTY Right 09/05/2014   Procedure: TOTAL KNEE ARTHROPLASTY;  Surgeon: [name], MD;  Location: [facility] OR;  Service: Orthopedics;  Laterality: Right;   TOTAL KNEE ARTHROPLASTY Left 01/10/2022   Procedure: TOTAL KNEE ARTHROPLASTY;  Surgeon: [name], MD;  Location: [facility] ORS;  Service: Orthopedics;  Laterality: Left;   TOTAL SHOULDER ARTHROPLASTY Left 11/17/2017   Procedure: LEFT TOTAL SHOULDER ARTHROPLASTY;  Surgeon: [name], MD;  Location: [facility] ORS;  Service: Orthopedics;  Laterality: Left;  with block   TUBAL LIGATION  1972   Patient Active Problem List   Diagnosis Date Noted   CAD (coronary artery disease) 05/28/2022   Osteoarthritis 05/26/2022   Psoriatic arthritis (HCC) 05/26/2022   Tachy-brady syndrome (HCC) 05/26/2022   PAF (paroxysmal atrial fibrillation) (HCC) 04/14/2022   Hyperlipidemia 04/14/2022   Ischemic cardiomyopathy 04/14/2022   Hypertension 04/14/2022   Hypothyroidism 04/14/2022   STEMI involving left anterior descending coronary artery (HCC) 04/07/2022   ST elevation myocardial  infarction involving left anterior descending (LAD) coronary artery (HCC)    S/P total knee arthroplasty, left 01/10/2022   H/O total shoulder replacement, left 11/17/2017   Primary osteoarthritis of right knee 09/05/2014    ONSET DATE: 2023  REFERRING DIAG: R29.818 (ICD-10-CM) - Other symptoms and signs involving the nervous system  THERAPY DIAG:  Dizziness and giddiness  Unsteadiness on feet  Rationale for Evaluation and Treatment: Rehabilitation  SUBJECTIVE:                                                                                                                                                                                              SUBJECTIVE STATEMENT: Patient reports that she gets dizzy when she stands up and walks or gets out of bed. Reports that she has many medical issues including neuropathy, knee issues, shoulder issues, and visual issues. Dizziness ongoing for 2 years, reports that she had a heart attack about 2 years ago.Denies head trauma, hearing loss, tinnitus, migraines. Patient reports occasional diplopia when reading and hx of macular degeneration. Reports that she was getting shots in her eyes and had an inflammatory reaction to those procedures.    Pt accompanied by: significant other  PERTINENT HISTORY:Pacemaker, CAD, depression, GERD, HTN, macular degeneration, psoriatic arthritis, back surgery 2000, B TKA, L TSA; per pt- MI in 2023  PAIN:  Are you having pain? No  PRECAUTIONS: Fall  RED FLAGS: None   WEIGHT BEARING RESTRICTIONS: No  FALLS: Has patient fallen in last 6 months? No  LIVING ENVIRONMENT: Lives with: lives with their spouse Lives in: House/apartment Stairs: 2 steps to enter with handrail; 1 story home Has following equipment at home: Grab bars and walker, tri-tip cane  PLOF: Independent with basic ADLs, Vocation/Vocational requirements: retired, and Leisure: sewing, computer games   PATIENT GOALS: improve dizziness   OBJECTIVE:  Note: Objective measures were completed at Evaluation unless otherwise noted.  DIAGNOSTIC FINDINGS: 09/26/23 Brain MRI:  No evidence of an acute intracranial abnormality. 2. Moderate chronic small vessel ischemic changes within the cerebral white matter. 3. Mild generalized cerebral atrophy.    COGNITION: Overall cognitive status: Within functional limits for tasks assessed   SENSATION: Pt reports burning in B feet d/t neuropathy; denies N/T   POSTURE: rounded shoulders, forward head, and increased thoracic kyphosis  GAIT: Findings: Assistive device utilized:cane, Level of assistance:  Modified independence, and Comments: mild imbalance with gait, reduced speed   VESTIBULAR ASSESSMENT   GENERAL OBSERVATION: pt wears bifocals; she reports better vision in the L eye and turns head R to use this dominant eye    OCULOMOTOR EXAM: Ocular Alignment:  WFL   Ocular ROM: No Limitations   Spontaneous Nystagmus: absent   Gaze-Induced Nystagmus: absent   Smooth Pursuits: saccades and d/t limited visual acuity especially in midline of visual field   Saccades: difficulty d/t limited visual acuity especially in midline of visual field    Convergence/Divergence: 21 cm    VESTIBULAR - OCULAR REFLEX:    Slow VOR: Normal   VOR Cancellation: Corrective Saccades to R   Head-Impulse Test: testing limited d/t muscle guarding       POSITIONAL TESTING:  Right Roll Test: negative Left Roll Test: negative  Right Loaded Dix-Hallpike: negative Left Loaded Dix-Hallpike: negative  *c/o mild dizziness upon sitting up from mat after testing                                                                                                                                 TREATMENT DATE: 03/20/24    PATIENT EDUCATION: Education details: prognosis, POC, HEP, edu on orthostatic testing and possibility of this contributing to dizziness with gait and transfers, as well as need for strong vestibular system d/t visual impairments  Person educated: Patient and Spouse Education method: Explanation Education comprehension: verbalized understanding  HOME EXERCISE PROGRAM: Not yet initiated  GOALS: Goals reviewed with patient? Yes  SHORT TERM GOALS: Target date: 04/10/2024  Patient to be independent with initial HEP. Baseline: HEP initiated Goal status: INITIAL   LONG TERM GOALS: Target date: 05/01/2024  Patient to be independent with advanced HEP. Baseline: Not yet initiated  Goal status: INITIAL  Patient to report 50% improvement in dizziness.  Baseline: - Goal status: INITIAL  Patient  to score at least 20/24 on DGI in order to decrease risk of falls.  Baseline: NT Goal status: INITIAL  Patient to demonstrate mild-moderate imbalance with M-CTSIB condition 4 in order to improve safety on uneven surfaces.    Baseline: NT Goal status: INITIAL   ASSESSMENT:  CLINICAL IMPRESSION:  Patient is a 83 y/o F presenting to OPPT with c/o chronic dizziness for the past 2 years. Patient today presenting with Rounded posture, difficulty completing oculomotor testing d/t decreased visual acuity/impaired vision, and difficulty completing accurate HIT d/t guarding. Patient did report mild dizziness upon sitting up from mat, thus raises concern about orthostasis which will be tested next session along with balance testing. Patient would benefit from additional skilled PT services 1-2x/week for 6 weeks to address impairments. .   OBJECTIVE IMPAIRMENTS: Abnormal gait, decreased balance, dizziness, postural dysfunction, and pain.   ACTIVITY LIMITATIONS: carrying, lifting, bending, sitting, standing, squatting, sleeping, transfers, bed mobility, bathing, toileting, dressing, reach over head, and locomotion level  PARTICIPATION LIMITATIONS: meal prep, cleaning, laundry, shopping, community activity, and church  PERSONAL FACTORS: Age, Past/current experiences, Time since onset of injury/illness/exacerbation, and 3+ comorbidities: Pacemaker, CAD, depression, GERD, HTN, macular degeneration, psoriatic arthritis, back surgery 2000, B TKA, L TSA; per pt- MI in 2023 are also affecting patient's functional outcome.  REHAB POTENTIAL: Good  CLINICAL DECISION MAKING: Evolving/moderate complexity  EVALUATION COMPLEXITY: Moderate  PLAN:  PT FREQUENCY: 1-2x/week  PT DURATION: 6 weeks  PLANNED INTERVENTIONS: 97164- PT Re-evaluation, 97110-Therapeutic exercises, 97530- Therapeutic activity, 97112- Neuromuscular re-education, 97535- Self Care, 02859- Manual therapy, [phone]- Gait training, [phone]-  Canalith repositioning, Patient/Family education, Balance training, Stair training, Taping, Vestibular training, DME instructions, Cryotherapy, and Moist heat  PLAN FOR NEXT SESSION: check orthostatics, MCTSIB, DGI; initiate HEP for balance and may need to edu or reach out to PCP about orthostatics if they are positive     Referring diagnosis?  R29.818 (ICD-10-CM) - Other symptoms and signs involving the nervous system  Treatment diagnosis? (if different than referring diagnosis)  Dizziness and giddiness Unsteadiness on feet  What was this (referring dx) caused by? []  Surgery []  Fall []  Ongoing issue []  Arthritis [x]  Other: ____________  Laterality: []  Rt []  Lt [x]  Both  Check all possible CPT codes:  *CHOOSE 10 OR LESS*    See Planned Interventions listed in the Plan section of the Evaluation.     Louana Terrilyn Christians, PT, DPT 03/20/24 11:55 AM  [facility] [address] [place], [address] [place], [place] [address] Phone # [phone] Fax # [phone]

## 2024-03-20 ENCOUNTER — Encounter (HOSPITAL_COMMUNITY)

## 2024-03-20 ENCOUNTER — Encounter: Payer: Self-pay | Admitting: Physical Therapy

## 2024-03-20 ENCOUNTER — Other Ambulatory Visit: Payer: Self-pay

## 2024-03-20 ENCOUNTER — Ambulatory Visit: Attending: Family Medicine | Admitting: Physical Therapy

## 2024-03-20 DIAGNOSIS — R42 Dizziness and giddiness: Secondary | ICD-10-CM | POA: Diagnosis not present

## 2024-03-20 DIAGNOSIS — R2681 Unsteadiness on feet: Secondary | ICD-10-CM | POA: Insufficient documentation

## 2024-03-21 DIAGNOSIS — H43812 Vitreous degeneration, left eye: Secondary | ICD-10-CM | POA: Diagnosis not present

## 2024-03-21 DIAGNOSIS — H353221 Exudative age-related macular degeneration, left eye, with active choroidal neovascularization: Secondary | ICD-10-CM | POA: Diagnosis not present

## 2024-03-21 DIAGNOSIS — H43392 Other vitreous opacities, left eye: Secondary | ICD-10-CM | POA: Diagnosis not present

## 2024-03-21 DIAGNOSIS — H20042 Secondary noninfectious iridocyclitis, left eye: Secondary | ICD-10-CM | POA: Diagnosis not present

## 2024-03-21 DIAGNOSIS — H353123 Nonexudative age-related macular degeneration, left eye, advanced atrophic without subfoveal involvement: Secondary | ICD-10-CM | POA: Diagnosis not present

## 2024-03-21 DIAGNOSIS — H35033 Hypertensive retinopathy, bilateral: Secondary | ICD-10-CM | POA: Diagnosis not present

## 2024-03-21 DIAGNOSIS — H353114 Nonexudative age-related macular degeneration, right eye, advanced atrophic with subfoveal involvement: Secondary | ICD-10-CM | POA: Diagnosis not present

## 2024-03-22 ENCOUNTER — Encounter (HOSPITAL_COMMUNITY)

## 2024-03-23 ENCOUNTER — Other Ambulatory Visit: Payer: Self-pay | Admitting: Cardiology

## 2024-03-23 DIAGNOSIS — I5022 Chronic systolic (congestive) heart failure: Secondary | ICD-10-CM

## 2024-03-23 DIAGNOSIS — I480 Paroxysmal atrial fibrillation: Secondary | ICD-10-CM

## 2024-03-23 DIAGNOSIS — I255 Ischemic cardiomyopathy: Secondary | ICD-10-CM

## 2024-03-27 ENCOUNTER — Encounter (HOSPITAL_COMMUNITY)

## 2024-03-28 ENCOUNTER — Ambulatory Visit: Admitting: Physical Therapy

## 2024-03-28 NOTE — Therapy (Signed)
 OUTPATIENT PHYSICAL THERAPY NEURO TREATMENT   Patient Name: Connie [name] MRN: [id number] DOB:07-11-1941, 83 y.o., female Today's Date: 03/29/2024   PCP: [name], MD REFERRING PROVIDER: [name], MD  END OF SESSION:  PT End of Session - 03/29/24 0932     Visit Number 2    Number of Visits 13    Date for PT Re-Evaluation 05/01/24    Authorization Type [employer]    Authorization Time Period approved 13 PT visits from 03/20/2024 - 05/01/2024    Authorization - Visit Number 2    Authorization - Number of Visits 13    PT Start Time [phone]    PT Stop Time 0931    PT Time Calculation (min) 39 min    Equipment Utilized During Treatment Gait belt    Activity Tolerance Patient tolerated treatment well    Behavior During Therapy [facility] for tasks assessed/performed           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   Multiple, noted on US    History of hiatal hernia    Hypertension    Hypothyroidism    Macular degeneration    Psoriatic arthritis (HCC)    Sinus drainage    UTI (urinary tract infection)    Past Surgical History:  Procedure Laterality Date   BACK SURGERY  2000   Dr Alix   bladder tack  2005   COLONOSCOPY     CORONARY ANGIOPLASTY     CORONARY STENT INTERVENTION N/A 04/11/2022   Procedure: CORONARY STENT INTERVENTION;  Surgeon: [name], MD;  Location: [facility] INVASIVE CV LAB;  Service: Cardiovascular;  Laterality: N/A;   DILATION AND CURETTAGE OF UTERUS     after miscarriage   FALSE ANEURYSM REPAIR Right 11/09/2022   Procedure: 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;  Surgeon: [name], MD;  Location: [facility] OR;  Service: Vascular;  Laterality: Right;   INSERT / REPLACE / REMOVE PACEMAKER     LEFT HEART CATH AND CORONARY ANGIOGRAPHY N/A 04/07/2022   Procedure: LEFT  HEART CATH AND CORONARY ANGIOGRAPHY;  Surgeon: [name], MD;  Location: [facility] INVASIVE CV LAB;  Service: Cardiovascular;  Laterality: N/A;   PACEMAKER IMPLANT N/A 05/27/2022   Procedure: PACEMAKER IMPLANT;  Surgeon: [name], MD;  Location: [facility] INVASIVE CV LAB;  Service: Cardiovascular;  Laterality: N/A;   RECTAL PROLAPSE REPAIR  2008   SCAR REVISION Right 2017   Knee, Dr. Yvone   TONSILLECTOMY     as a child   TOTAL KNEE ARTHROPLASTY Right 09/05/2014   Procedure: TOTAL KNEE ARTHROPLASTY;  Surgeon: [name], MD;  Location: [facility] OR;  Service: Orthopedics;  Laterality: Right;   TOTAL KNEE ARTHROPLASTY Left 01/10/2022   Procedure: TOTAL KNEE ARTHROPLASTY;  Surgeon: [name], MD;  Location: [facility];  Service: Orthopedics;  Laterality: Left;   TOTAL SHOULDER ARTHROPLASTY Left 11/17/2017   Procedure: LEFT TOTAL SHOULDER ARTHROPLASTY;  Surgeon: [name], MD;  Location: [facility] OR[facility];  Service: Orthopedics;  Laterality: Left;  with block   TUBAL LIGATION  1972   Patient Active Problem List   Diagnosis Date Noted   CAD (coronary artery disease) 05/28/2022   Osteoarthritis 05/26/2022   Psoriatic arthritis (HCC) 05/26/2022   Tachy-brady syndrome (HCC) 05/26/2022   PAF (paroxysmal atrial fibrillation) (HCC) 04/14/2022  Hyperlipidemia 04/14/2022   Ischemic cardiomyopathy 04/14/2022   Hypertension 04/14/2022   Hypothyroidism 04/14/2022   STEMI involving left anterior descending coronary artery (HCC) 04/07/2022   ST elevation myocardial infarction involving left anterior descending (LAD) coronary artery (HCC)    S/P total knee arthroplasty, left 01/10/2022   H/O total shoulder replacement, left 11/17/2017   Primary osteoarthritis of right knee 09/05/2014    ONSET DATE: 2023  REFERRING DIAG: R29.818 (ICD-10-CM) - Other symptoms and signs involving the nervous system  THERAPY DIAG:  Dizziness and giddiness  Unsteadiness on feet  Rationale for Evaluation and Treatment:  Rehabilitation  SUBJECTIVE:                                                                                                                                                                                             SUBJECTIVE STATEMENT: Same old.    Pt accompanied by: significant other  PERTINENT HISTORY:Pacemaker, CAD, depression, GERD, HTN, macular degeneration, psoriatic arthritis, back surgery 2000, B TKA, L TSA; per pt- MI in 2023  PAIN:  Are you having pain? No  PRECAUTIONS: Fall  RED FLAGS: None   WEIGHT BEARING RESTRICTIONS: No  FALLS: Has patient fallen in last 6 months? No  LIVING ENVIRONMENT: Lives with: lives with their spouse Lives in: House/apartment Stairs: 2 steps to enter with handrail; 1 story home Has following equipment at home: Grab bars and walker, tri-tip cane  PLOF: Independent with basic ADLs, Vocation/Vocational requirements: retired, and Leisure: sewing, computer games   PATIENT GOALS: improve dizziness   OBJECTIVE:     TODAY'S TREATMENT: 03/29/24    Orthostatic Testing   Supine Sitting Standing  x1 Minute Standing x 3 Minutes  BP 134/72 mmHg 124/73 *fuzzy 101/61 92/58  HR 66 bpm 66 70 67    M-CTSIB  Condition 1: Firm Surface, EO 30 Sec, Mild Sway  Condition 2: Firm Surface, EC 30 Sec, Mild Sway  Condition 3: Foam Surface, EO 30 Sec, Moderate Sway  Condition 4: Foam Surface, EC 13 Sec, Severe Sway         Ankle pumps, then marching in place to address orthostasis upon standing up      PATIENT EDUCATION: Education details: edu on orthostatic measures and conservative management including increased hydration, compression socks, ankle pumps and answered family's questions; advised pt that this PT reached out to her providers about orthostasis, edu on balance activities  Person educated: Patient and Spouse Education method: Explanation, Demonstration, Tactile cues, and Verbal cues Education comprehension: verbalized understanding  and returned demonstration      Note: Objective measures were completed at Evaluation unless otherwise noted.  DIAGNOSTIC FINDINGS: 09/26/23 Brain MRI:  No evidence of an acute intracranial abnormality. 2. Moderate chronic small vessel ischemic changes within the cerebral white matter. 3. Mild generalized cerebral atrophy.    COGNITION: Overall cognitive status: Within functional limits for tasks assessed   SENSATION: Pt reports burning in B feet d/t neuropathy; denies N/T   POSTURE: rounded shoulders, forward head, and increased thoracic kyphosis  GAIT: Findings: Assistive device utilized:cane, Level of assistance: Modified independence, and Comments: mild imbalance with gait, reduced speed   VESTIBULAR ASSESSMENT   GENERAL OBSERVATION: pt wears bifocals; she reports better vision in the L eye and turns head R to use this dominant eye    OCULOMOTOR EXAM: Ocular Alignment: WFL   Ocular ROM: No Limitations   Spontaneous Nystagmus: absent   Gaze-Induced Nystagmus: absent   Smooth Pursuits: saccades and d/t limited visual acuity especially in midline of visual field   Saccades: difficulty d/t limited visual acuity especially in midline of visual field    Convergence/Divergence: 21 cm    VESTIBULAR - OCULAR REFLEX:    Slow VOR: Normal   VOR Cancellation: Corrective Saccades to R   Head-Impulse Test: testing limited d/t muscle guarding       POSITIONAL TESTING:  Right Roll Test: negative Left Roll Test: negative  Right Loaded Dix-Hallpike: negative Left Loaded Dix-Hallpike: negative  *c/o mild dizziness upon sitting up from mat after testing                                                                                                                                 TREATMENT DATE: 03/20/24    PATIENT EDUCATION: Education details: prognosis, POC, HEP, edu on orthostatic testing and possibility of this contributing to dizziness with gait and transfers, as well as  need for strong vestibular system d/t visual impairments  Person educated: Patient and Spouse Education method: Explanation Education comprehension: verbalized understanding  HOME EXERCISE PROGRAM: Not yet initiated  GOALS: Goals reviewed with patient? Yes  SHORT TERM GOALS: Target date: 04/10/2024  Patient to be independent with initial HEP. Baseline: HEP initiated Goal status: IN PROGRESS   LONG TERM GOALS: Target date: 05/01/2024  Patient to be independent with advanced HEP. Baseline: Not yet initiated  Goal status: IN PROGRESS  Patient to report 50% improvement in dizziness.  Baseline: - Goal status: IN PROGRESS  Patient to score at least 20/24 on DGI in order to decrease risk of falls.  Baseline: NT Goal status: IN PROGRESS  Patient to demonstrate mild-moderate imbalance with M-CTSIB condition 4 in order to improve safety on uneven surfaces.    Baseline: 13 sec with severe sway 03/29/24  Goal status: IN PROGRESS   ASSESSMENT:  CLINICAL IMPRESSION: Patient arrived to session without complaints. Orthostatic measures were positive and pt's providers were contacted. Patient and family were educated on conservative management at home. Multisensory balance testing revealed imbalance on uneven surfaces, thus plan to address balance in future  sessions. Pt tolerated session well and without complaints upon leaving.   OBJECTIVE IMPAIRMENTS: Abnormal gait, decreased balance, dizziness, postural dysfunction, and pain.   ACTIVITY LIMITATIONS: carrying, lifting, bending, sitting, standing, squatting, sleeping, transfers, bed mobility, bathing, toileting, dressing, reach over head, and locomotion level  PARTICIPATION LIMITATIONS: meal prep, cleaning, laundry, shopping, community activity, and church  PERSONAL FACTORS: Age, Past/current experiences, Time since onset of injury/illness/exacerbation, and 3+ comorbidities: Pacemaker, CAD, depression, GERD, HTN, macular degeneration,  psoriatic arthritis, back surgery 2000, B TKA, L TSA; per pt- MI in 2023 are also affecting patient's functional outcome.   REHAB POTENTIAL: Good  CLINICAL DECISION MAKING: Evolving/moderate complexity  EVALUATION COMPLEXITY: Moderate  PLAN:  PT FREQUENCY: 1-2x/week  PT DURATION: 6 weeks  PLANNED INTERVENTIONS: 97164- PT Re-evaluation, 97110-Therapeutic exercises, 97530- Therapeutic activity, 97112- Neuromuscular re-education, 97535- Self Care, 02859- Manual therapy, [phone]- Gait training, [phone]- Canalith repositioning, Patient/Family education, Balance training, Stair training, Taping, Vestibular training, DME instructions, Cryotherapy, and Moist heat  PLAN FOR NEXT SESSION:  DGI; initiate HEP for balance     Louana Terrilyn Christians, PT, DPT 03/29/24 9:33 AM  [facility] at [facility] [address] [place], [place] [address] Phone # [phone] Fax # [phone]

## 2024-03-29 ENCOUNTER — Telehealth: Payer: Self-pay | Admitting: Physical Therapy

## 2024-03-29 ENCOUNTER — Encounter (HOSPITAL_COMMUNITY)

## 2024-03-29 ENCOUNTER — Encounter: Payer: Self-pay | Admitting: Physical Therapy

## 2024-03-29 ENCOUNTER — Other Ambulatory Visit: Payer: Self-pay | Admitting: *Deleted

## 2024-03-29 ENCOUNTER — Ambulatory Visit: Attending: Family Medicine | Admitting: Physical Therapy

## 2024-03-29 DIAGNOSIS — R42 Dizziness and giddiness: Secondary | ICD-10-CM | POA: Diagnosis not present

## 2024-03-29 DIAGNOSIS — R2681 Unsteadiness on feet: Secondary | ICD-10-CM | POA: Diagnosis not present

## 2024-03-29 NOTE — Telephone Encounter (Signed)
 Called patient and made her aware per Dr. Kate to stop taking Lasix . Understanding verbalized.

## 2024-03-29 NOTE — Telephone Encounter (Signed)
 Recommend stopping her lasix 

## 2024-03-29 NOTE — Telephone Encounter (Signed)
 Hello Drs. Chrystal armin Nanas,  I am seeing Ms. Gafford in OPPT for dizziness. Her vestibular exam was grossly normal, however orthostatic values were abnormal today. Please see below. She sees Dr. Nanas 04/05/24. Please advise if any changes to POC.    Orthostatic Testing   Supine Sitting Standing  x1 Minute Standing x 3 Minutes  BP 134/72 mmHg 124/73 *fuzzy 101/61 92/58  HR 66 bpm 66 70 67      Thanks,  Louana Terrilyn Christians, [place], DPT 03/29/24 9:09 AM  [facility] at [facility] [address] [place], [place] [address] Phone # [phone] Fax # [phone]

## 2024-04-01 ENCOUNTER — Encounter (HOSPITAL_COMMUNITY)

## 2024-04-01 NOTE — Progress Notes (Signed)
 Cardiology Office Note:    Date:  04/09/2024   ID:  Connie, [name] November 13, 1940, MRN [id number]  PCP:  [name], MD  Cardiologist:  [name], MD  Electrophysiologist:  [name], MD   Referring MD: [name], *   Chief Complaint  Patient presents with   Congestive Heart Failure    History of Present Illness:    Connie [name] is a 83 y.o. female with a hx of paroxysmal atrial flutter, tachycardia-bradycardia syndrome s/p PPM hypertension, hypothyroidism, hyperlipidemia, GERD, psoriatic arthritis who presents for follow-up.  He was referred by Dr Chrystal for evaluation of dyspnea and palpitations, initially seen on 03/10/2021.    Zio patch x7 days on 03/25/2021 showed 2% atrial flutter burden, average rate 141 bpm.  Longest episode lasted 15 minutes.  Also with occasional PACs, 2.3% of beats.  Echocardiogram 04/07/2021 showed normal biventricular function, no significant valvular disease.  Calcium  score on 04/07/2021 was 604 (81st percentile).  Lexiscan  Myoview  on 08/10/2021 showed normal perfusion, EF 79%.  She was admitted 03/2022 with anterior STEMI, underwent DES to mid LAD; also noted to have severe mid RCA disease and underwent staged DES to RCA 04/11/2022.  Echocardiogram showed EF 35 to 40%.  During hospitalization noted to have A-fib with RVR and postconversion pauses up to 6 to 7 seconds, EP was consulted and PPM deferred.  Outpatient monitor showed significant pauses along with A-fib/flutter with RVR.  Underwent PPM 05/2022.  Echocardiogram 04/2023 showed EF 30 to 35%, normal RV function, mild mitral regurgitation.  Since last clinic visit, she reports that she has been doing okay.  States that has been feeling very fatigued.  She decreased her thyroid  medication recently.  Reports had drop in BP with standing with physical therapy in September.  She is working with physical therapy.  She was recommended to get compression  stockings.  She denies any chest pain.  Reports occasional dyspnea.  BP Readings from Last 3 Encounters:  04/05/24 (!) 154/74  02/07/24 118/62  01/11/24 (!) 140/66    Wt Readings from Last 3 Encounters:  04/05/24 144 lb 9.6 oz (65.6 kg)  02/07/24 144 lb 10 oz (65.6 kg)  01/11/24 144 lb 10 oz (65.6 kg)     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   Multiple, noted on US    History of hiatal hernia    Hypertension    Hypothyroidism    Macular degeneration    Psoriatic arthritis (HCC)    Sinus drainage    UTI (urinary tract infection)     Past Surgical History:  Procedure Laterality Date   BACK SURGERY  2000   Dr Alix   bladder tack  2005   COLONOSCOPY     CORONARY ANGIOPLASTY     CORONARY STENT INTERVENTION N/A 04/11/2022   Procedure: CORONARY STENT INTERVENTION;  Surgeon: [name], MD;  Location: [facility] INVASIVE CV LAB;  Service: Cardiovascular;  Laterality: N/A;   DILATION AND CURETTAGE OF UTERUS     after miscarriage   FALSE ANEURYSM REPAIR Right 11/09/2022   Procedure: 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;  Surgeon: [name], MD;  Location: [facility] OR;  Service: Vascular;  Laterality: Right;   INSERT / REPLACE / REMOVE PACEMAKER     LEFT HEART CATH AND CORONARY  ANGIOGRAPHY N/A 04/07/2022   Procedure: LEFT HEART CATH AND CORONARY ANGIOGRAPHY;  Surgeon: [name], MD;  Location: [facility] INVASIVE CV LAB;  Service: Cardiovascular;  Laterality: N/A;   PACEMAKER IMPLANT N/A 05/27/2022   Procedure: PACEMAKER IMPLANT;  Surgeon: [name], MD;  Location: [facility] INVASIVE CV LAB;  Service: Cardiovascular;  Laterality: N/A;   RECTAL PROLAPSE REPAIR  2008   SCAR REVISION Right 2017   Knee, Dr. Yvone   TONSILLECTOMY     as a child   TOTAL KNEE ARTHROPLASTY Right 09/05/2014   Procedure: TOTAL KNEE ARTHROPLASTY;   Surgeon: [name], MD;  Location: [facility] OR;  Service: Orthopedics;  Laterality: Right;   TOTAL KNEE ARTHROPLASTY Left 01/10/2022   Procedure: TOTAL KNEE ARTHROPLASTY;  Surgeon: [name], MD;  Location: [facility] ORS;  Service: Orthopedics;  Laterality: Left;   TOTAL SHOULDER ARTHROPLASTY Left 11/17/2017   Procedure: LEFT TOTAL SHOULDER ARTHROPLASTY;  Surgeon: [name], MD;  Location: [facility] ORS;  Service: Orthopedics;  Laterality: Left;  with block   TUBAL LIGATION  1972    Current Medications: Current Meds  Medication Sig   aspirin  EC 81 MG tablet Take 1 tablet (81 mg total) by mouth daily. Swallow whole.   atorvastatin  (LIPITOR ) 40 MG tablet Take 1 tablet (40 mg total) by mouth daily.   buPROPion (WELLBUTRIN XL) 150 MG 24 hr tablet Take 150 mg by mouth daily.   carvedilol  (COREG ) 3.125 MG tablet TAKE 1 TABLET(3.125 MG) BY MOUTH TWICE DAILY   ELIQUIS  5 MG TABS tablet TAKE 1 TABLET(5 MG) BY MOUTH TWICE DAILY   empagliflozin  (JARDIANCE ) 10 MG TABS tablet Take 1 tablet (10 mg total) by mouth daily.   esomeprazole  (NEXIUM ) 20 MG capsule Take 1 capsule (20 mg total) by mouth as needed.   levothyroxine  (SYNTHROID ) 100 MCG tablet Take 100 mcg by mouth daily before breakfast.   nitroGLYCERIN  (NITROSTAT ) 0.4 MG SL tablet PLACE 1 TABLET UNDER THE TONGUE EVERY 5 MINUTES FOR 3 DOSES AS NEEDED FOR CHEST PAIN   polyethylene glycol (MIRALAX ) 17 g packet Take 17 g by mouth daily. Titrate as needed   sertraline  (ZOLOFT ) 100 MG tablet Take 100 mg by mouth daily.     Allergies:   Penicillins and Sulfa antibiotics   Social History   Socioeconomic History   Marital status: Married    Spouse name: [name]   Number of children: 2   Years of education: Not on file   Highest education level: Associate degree: academic program  Occupational History   Occupation: Retired   Occupation: retired  Tobacco Use   Smoking status: Never    Passive exposure: Never   Smokeless tobacco: Never  Vaping Use   Vaping  status: Never Used  Substance and Sexual Activity   Alcohol  use: Not Currently    Comment: rarely wine   Drug use: No   Sexual activity: Not Currently    Partners: Male    Birth control/protection: Post-menopausal    Comment: married  Other Topics Concern   Not on file  Social History Narrative   Lives at home with husband.  Son and daughter.  Right handed    Social Drivers of Health   Financial Resource Strain: Low Risk  (04/11/2022)   Overall Financial Resource Strain (CARDIA)    Difficulty of Paying Living Expenses: Not hard at all  Food Insecurity: No Food Insecurity (05/27/2022)   Hunger Vital Sign    Worried About Running Out of Food in the Last Year:  Never true    Ran Out of Food in the Last Year: Never true  Transportation Needs: No Transportation Needs (05/27/2022)   PRAPARE - Administrator, Civil Service (Medical): No    Lack of Transportation (Non-Medical): No  Physical Activity: Not on file  Stress: Not on file  Social Connections: Not on file     Family History: The patient's family history includes Cancer - Lung in her mother; Heart attack (age of onset: 73) in her father. There is no history of Breast cancer.  ROS:   Please see the history of present illness.     All other systems reviewed and are negative.  EKGs/Labs/Other Studies Reviewed:    The following studies were reviewed today:   EKG:   12/19/2023: Atrial paced, ventricular sensed rhythm.  Right bundle branch block, left anterior fascicular block, Q waves in V1-3, rate 63 04/05/2024: Atrial paced, ventricular sensed rhythm, right bundle branch block, left anterior fascicular block, Q waves in V1-5, rate 67  Recent Labs: 06/13/2023: Hemoglobin 13.1; Platelets 280; TSH 0.549 12/19/2023: ALT 27; BUN 16; Creatinine, Ser 1.01; Magnesium  2.2; Potassium 5.2; Sodium 142  Recent Lipid Panel    Component Value Date/Time   CHOL 129 12/19/2023 1050   TRIG 75 12/19/2023 1050   HDL 55 12/19/2023  1050   CHOLHDL 2.3 12/19/2023 1050   CHOLHDL 2.5 04/07/2022 1044   VLDL 21 04/07/2022 1044   LDLCALC 59 12/19/2023 1050    Physical Exam:    VS:  BP (!) 154/74 (BP Location: Right Arm, Cuff Size: Normal)   Pulse 63   Ht 5' 1 (1.549 m)   Wt 144 lb 9.6 oz (65.6 kg)   SpO2 97%   BMI 27.32 kg/m     Wt Readings from Last 3 Encounters:  04/05/24 144 lb 9.6 oz (65.6 kg)  02/07/24 144 lb 10 oz (65.6 kg)  01/11/24 144 lb 10 oz (65.6 kg)     GEN:  Well nourished, well developed in no acute distress HEENT: Normal NECK: No JVD; No carotid bruits LYMPHATICS: No lymphadenopathy CARDIAC: RRR, no murmurs, rubs, gallops RESPIRATORY:  Clear to auscultation without rales, wheezing or rhonchi  ABDOMEN: Soft, non-tender, non-distended MUSCULOSKELETAL:  No edema; No deformity  SKIN: Warm and dry NEUROLOGIC:  Alert and oriented x 3 PSYCHIATRIC:  Normal affect   ASSESSMENT:    1. Coronary artery disease involving native coronary artery of native heart, unspecified whether angina present   2. Chronic combined systolic and diastolic heart failure (HCC)   3. PAF (paroxysmal atrial fibrillation) (HCC)   4. Orthostatic hypotension   5. Lightheadedness   6. Hyperlipidemia LDL goal <70       PLAN:    Lightheadedness: Unclear cause.  Orthostatics at prior clinic visit showed decrease in systolic BP lying to standing but not meeting criteria for orthostatic hypotension.  Lasix  has been changed to 3 days/week.  Referred to neurology for evaluation, brain MRI showed no acute abnormalities - Reports had recent orthostatics that were positive with PT.  Encouraged to stay hydrated and use compression stockings.  Orthostatics in clinic today were unremarkable  Atrial fibrillation/flutter: Tachycardia-bradycardia syndrome, having A-fib with RVR and long postconversion pauses.  Status post PPM 05/2022, follows with EP -Continue Eliquis  5 mg twice daily -Continue carvedilol  3.125 mg twice  daily -Developed transaminitis on amiodarone .  Seen by GI, recommended stopping amiodarone .  Amiodarone  has been discontinued, will monitor for recurrence of A-fib; have not noted recurrence.  LFTs  have improved with holding amiodarone   CAD: Status post anterior STEMI 03/2022, underwent DES to mid LAD and then staged DES to RCA. -Continue Eliquis  5 mg twice daily, aspirin  81 mg daily -Continue carvedilol  -Continue atorvastatin , dose reduced to 40 mg daily due to transaminitis.    Chronic combined systolic and diastolic heart failure: Echocardiogram 04/2023 showed EF 30 to 35%, normal RV function, mild mitral regurgitation. -Continue carvedilol  3.125 mg twice daily -Continue Jardiance  10 mg daily - She is now off Lasix .  Appears euvolemic on exam -GDMT has been limited by soft BP.  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  Atorvastatin  dose decreased to 40 mg daily due to transaminitis.  LDL 59 on 12/19/2023   RTC in 4 months    Medication Adjustments/Labs and Tests Ordered: Current medicines are reviewed at length with the patient today.  Concerns regarding medicines are outlined above.  Orders Placed This Encounter  Procedures   EKG 12-Lead   ECHOCARDIOGRAM COMPLETE     No orders of the defined types were placed in this encounter.    Patient Instructions  Medication Instructions:  Continue current medications *If you need a refill on your cardiac medications before your next appointment, please call your pharmacy*  Lab Work: none If you have labs (blood work) drawn today and your tests are completely normal, you will receive your results only by: MyChart Message (if you have  MyChart) OR A paper copy in the mail If you have any lab test that is abnormal or we need to change your treatment, we will call you to review the results.  Testing/Procedures: Echo to done in 3-4 months  Your physician has requested that you have an echocardiogram. Echocardiography is a painless test that uses sound waves to create images of your heart. It provides your doctor with information about the size and shape of your heart and how well your heart's chambers and valves are working. This procedure takes approximately one hour. There are no restrictions for this procedure. Please do NOT wear cologne, perfume, aftershave, or lotions (deodorant is allowed). Please arrive 15 minutes prior to your appointment time.  Please note: We ask at that you not bring children with you during ultrasound (echo/ vascular) testing. Due to room size and safety concerns, children are not allowed in the ultrasound rooms during exams. Our front office staff cannot provide observation of children in our lobby area while testing is being conducted. An adult accompanying a patient to their appointment will only be allowed in the ultrasound room at the discretion of the ultrasound technician under special circumstances. We apologize for any inconvenience.   Follow-Up: At [facility], you and your health needs are our priority.  As part of our continuing mission to provide you with exceptional heart care, our providers are all part of one team.  This team includes your primary Cardiologist (physician) and Advanced Practice Providers or APPs (Physician Assistants and Nurse Practitioners) who all work together to provide you with the care you need, when you need it.  Your next appointment:   4 months  Provider:   Dr. KATE  We recommend signing up for the patient portal called MyChart.  Sign up information is provided on this After  Visit Summary.  MyChart is used to connect with patients for Virtual  Visits (Telemedicine).  Patients are able to view lab/test results, encounter notes, upcoming appointments, etc.  Non-urgent messages can be sent to your provider as well.   To learn more about what you can do with MyChart, go to [email or web address].   Other Instructions None           Signed, [name], MD  04/09/2024 10:47 AM    [facility]

## 2024-04-03 ENCOUNTER — Encounter (HOSPITAL_COMMUNITY)

## 2024-04-04 ENCOUNTER — Ambulatory Visit: Admitting: Physical Therapy

## 2024-04-04 ENCOUNTER — Encounter: Payer: Self-pay | Admitting: Physical Therapy

## 2024-04-04 DIAGNOSIS — R42 Dizziness and giddiness: Secondary | ICD-10-CM

## 2024-04-04 DIAGNOSIS — R2681 Unsteadiness on feet: Secondary | ICD-10-CM

## 2024-04-04 NOTE — Therapy (Signed)
 OUTPATIENT PHYSICAL THERAPY NEURO TREATMENT   Patient Name: Connie [name] MRN: [id number] DOB:07/14/1941, 83 y.o., female Today's Date: 04/04/2024   PCP: [name], MD REFERRING PROVIDER: [name], MD  END OF SESSION:  PT End of Session - 04/04/24 0931     Visit Number 3    Number of Visits 13    Date for PT Re-Evaluation 05/01/24    Authorization Type [employer]    Authorization Time Period approved 13 PT visits from 03/20/2024 - 05/01/2024    Authorization - Visit Number 3    Authorization - Number of Visits 13    PT Start Time 0931    PT Stop Time 1010    PT Time Calculation (min) 39 min    Equipment Utilized During Treatment Gait belt    Activity Tolerance Patient tolerated treatment well    Behavior During Therapy [facility] for tasks assessed/performed            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   Multiple, noted on US    History of hiatal hernia    Hypertension    Hypothyroidism    Macular degeneration    Psoriatic arthritis (HCC)    Sinus drainage    UTI (urinary tract infection)    Past Surgical History:  Procedure Laterality Date   BACK SURGERY  2000   Dr Alix   bladder tack  2005   COLONOSCOPY     CORONARY ANGIOPLASTY     CORONARY STENT INTERVENTION N/A 04/11/2022   Procedure: CORONARY STENT INTERVENTION;  Surgeon: [name], MD;  Location: [facility] INVASIVE CV LAB;  Service: Cardiovascular;  Laterality: N/A;   DILATION AND CURETTAGE OF UTERUS     after miscarriage   FALSE ANEURYSM REPAIR Right 11/09/2022   Procedure: 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;  Surgeon: [name], MD;  Location: [facility] OR;  Service: Vascular;  Laterality: Right;   INSERT / REPLACE / REMOVE PACEMAKER     LEFT HEART CATH AND CORONARY ANGIOGRAPHY N/A 04/07/2022   Procedure: LEFT  HEART CATH AND CORONARY ANGIOGRAPHY;  Surgeon: [name], MD;  Location: [facility] INVASIVE CV LAB;  Service: Cardiovascular;  Laterality: N/A;   PACEMAKER IMPLANT N/A 05/27/2022   Procedure: PACEMAKER IMPLANT;  Surgeon: [name], MD;  Location: [facility] INVASIVE CV LAB;  Service: Cardiovascular;  Laterality: N/A;   RECTAL PROLAPSE REPAIR  2008   SCAR REVISION Right 2017   Knee, Dr. Yvone   TONSILLECTOMY     as a child   TOTAL KNEE ARTHROPLASTY Right 09/05/2014   Procedure: TOTAL KNEE ARTHROPLASTY;  Surgeon: [name], MD;  Location: [facility] OR;  Service: Orthopedics;  Laterality: Right;   TOTAL KNEE ARTHROPLASTY Left 01/10/2022   Procedure: TOTAL KNEE ARTHROPLASTY;  Surgeon: [name], MD;  Location: [facility] ORS;  Service: Orthopedics;  Laterality: Left;   TOTAL SHOULDER ARTHROPLASTY Left 11/17/2017   Procedure: LEFT TOTAL SHOULDER ARTHROPLASTY;  Surgeon: [name], MD;  Location: [facility] ORS;  Service: Orthopedics;  Laterality: Left;  with block   TUBAL LIGATION  1972   Patient Active Problem List   Diagnosis Date Noted   CAD (coronary artery disease) 05/28/2022   Osteoarthritis 05/26/2022   Psoriatic arthritis (HCC) 05/26/2022   Tachy-brady syndrome (HCC) 05/26/2022   PAF (paroxysmal atrial fibrillation) (HCC)  04/14/2022   Hyperlipidemia 04/14/2022   Ischemic cardiomyopathy 04/14/2022   Hypertension 04/14/2022   Hypothyroidism 04/14/2022   STEMI involving left anterior descending coronary artery (HCC) 04/07/2022   ST elevation myocardial infarction involving left anterior descending (LAD) coronary artery (HCC)    S/P total knee arthroplasty, left 01/10/2022   H/O total shoulder replacement, left 11/17/2017   Primary osteoarthritis of right knee 09/05/2014    ONSET DATE: 2023  REFERRING DIAG: R29.818 (ICD-10-CM) - Other symptoms and signs involving the nervous system  THERAPY DIAG:  Dizziness and giddiness  Unsteadiness on feet  Rationale for Evaluation and Treatment:  Rehabilitation  SUBJECTIVE:                                                                                                                                                                                             SUBJECTIVE STATEMENT: Pt states she took her BP at home and it was around 117/70 something. Reports no more dizziness than usual.    Pt accompanied by: significant other  PERTINENT HISTORY:Pacemaker, CAD, depression, GERD, HTN, macular degeneration, psoriatic arthritis, back surgery 2000, B TKA, L TSA; per pt- MI in 2023  PAIN:  Are you having pain? No  PRECAUTIONS: Fall  RED FLAGS: None   WEIGHT BEARING RESTRICTIONS: No  FALLS: Has patient fallen in last 6 months? No  LIVING ENVIRONMENT: Lives with: lives with their spouse Lives in: House/apartment Stairs: 2 steps to enter with handrail; 1 story home Has following equipment at home: Grab bars and walker, tri-tip cane  PLOF: Independent with basic ADLs, Vocation/Vocational requirements: retired, and Leisure: sewing, computer games   PATIENT GOALS: improve dizziness   OBJECTIVE:  TODAY'S TREATMENT: 04/04/24 Activity Comments  Seated vitals 137/73, 74 BPM  Nustep L4 x 5 min UEs/LEs   Standing vitals 136/78, 83 BPM  DGI = 16/14   EO head nods and head turns x10 each EC static balance x30 EC head nods and head turns 2x10 Feet together in corner Moderate sway  Heel/toe raise 2x10 Hip abduction 2x10      TODAY'S TREATMENT: 03/29/24  Orthostatic Testing   Supine Sitting Standing  x1 Minute Standing x 3 Minutes  BP 134/72 mmHg 124/73 *fuzzy 101/61 92/58  HR 66 bpm 66 70 67   M-CTSIB  Condition 1: Firm Surface, EO 30 Sec, Mild Sway  Condition 2: Firm Surface, EC 30 Sec, Mild Sway  Condition 3: Foam Surface, EO 30 Sec, Moderate Sway  Condition 4: Foam Surface, EC 13 Sec, Severe Sway      Ankle pumps, then marching in place to address orthostasis upon standing up  PATIENT EDUCATION: Education  details: HEP Person educated: Patient and Spouse Education method: Explanation, Demonstration, Actor cues, and Verbal cues Education comprehension: verbalized understanding and returned demonstration   HOME EXERCISE PROGRAM: Access Code: [email or web address] URL: [email or web address] Date: 04/04/2024 Prepared by: Luther Newhouse April Earnie Starring  Exercises - Corner Balance Feet Together With Eyes Closed  - 1 x daily - 7 x weekly - 2 sets - 30 sec hold - Corner Balance Feet Together: Eyes Closed With Head Turns  - 1 x daily - 7 x weekly - 2 sets - 10 reps - Heel Toe Raises with Unilateral Counter Support  - 1 x daily - 7 x weekly - 2 sets - 10 reps - Standing Hip Abduction with Unilateral Counter Support  - 1 x daily - 7 x weekly - 2 sets - 10 reps   Note: Objective measures were completed at Evaluation unless otherwise noted.  DIAGNOSTIC FINDINGS: 09/26/23 Brain MRI:  No evidence of an acute intracranial abnormality. 2. Moderate chronic small vessel ischemic changes within the cerebral white matter. 3. Mild generalized cerebral atrophy.    COGNITION: Overall cognitive status: Within functional limits for tasks assessed   SENSATION: Pt reports burning in B feet d/t neuropathy; denies N/T   POSTURE: rounded shoulders, forward head, and increased thoracic kyphosis  GAIT: Findings: Assistive device utilized:cane, Level of assistance: Modified independence, and Comments: mild imbalance with gait, reduced speed   VESTIBULAR ASSESSMENT   GENERAL OBSERVATION: pt wears bifocals; she reports better vision in the L eye and turns head R to use this dominant eye    OCULOMOTOR EXAM: Ocular Alignment: WFL   Ocular ROM: No Limitations   Spontaneous Nystagmus: absent   Gaze-Induced Nystagmus: absent   Smooth Pursuits: saccades and d/t limited visual acuity especially in midline of visual field   Saccades: difficulty d/t limited visual acuity especially in midline of visual field     Convergence/Divergence: 21 cm    VESTIBULAR - OCULAR REFLEX:    Slow VOR: Normal   VOR Cancellation: Corrective Saccades to R   Head-Impulse Test: testing limited d/t muscle guarding       POSITIONAL TESTING:  Right Roll Test: negative Left Roll Test: negative  Right Loaded Dix-Hallpike: negative Left Loaded Dix-Hallpike: negative  *c/o mild dizziness upon sitting up from mat after testing   FUNCTIONAL TESTING  [facility] PT Assessment - 04/04/24 0001       Standardized Balance Assessment   Standardized Balance Assessment Dynamic Gait Index      Dynamic Gait Index   Level Surface Mild Impairment   10 meters 17.84 sec   Change in Gait Speed Mild Impairment    Gait with Horizontal Head Turns Normal    Gait with Vertical Head Turns Normal    Gait and Pivot Turn Mild Impairment    Step Over Obstacle Moderate Impairment    Step Around Obstacles Mild Impairment    Steps Moderate Impairment    Total Score 16           PATIENT EDUCATION: Education details: prognosis, POC, HEP, edu on orthostatic testing and possibility of this contributing to dizziness with gait and transfers, as well as need for strong vestibular system d/t visual impairments  Person educated: Patient and Spouse Education method: Explanation Education comprehension: verbalized understanding    GOALS: Goals reviewed with patient? Yes  SHORT TERM GOALS: Target date: 04/10/2024  Patient to be independent with initial HEP. Baseline: HEP initiated Goal status: IN PROGRESS   LONG TERM GOALS:  Target date: 05/01/2024  Patient to be independent with advanced HEP. Baseline: Not yet initiated  Goal status: IN PROGRESS  Patient to report 50% improvement in dizziness.  Baseline: - Goal status: IN PROGRESS  Patient to score at least 20/24 on DGI in order to decrease risk of falls.  Baseline: NT Goal status: IN PROGRESS  Patient to demonstrate mild-moderate imbalance with M-CTSIB condition 4 in order to  improve safety on uneven surfaces.    Baseline: 13 sec with severe sway 03/29/24  Goal status: IN PROGRESS   ASSESSMENT:  CLINICAL IMPRESSION: Improved BP this session. Able to capture DGI this session with pt demonstrating high fall risk based on a score of 16/24. Initiated balance and strengthening exercise.   OBJECTIVE IMPAIRMENTS: Abnormal gait, decreased balance, dizziness, postural dysfunction, and pain.   ACTIVITY LIMITATIONS: carrying, lifting, bending, sitting, standing, squatting, sleeping, transfers, bed mobility, bathing, toileting, dressing, reach over head, and locomotion level  PARTICIPATION LIMITATIONS: meal prep, cleaning, laundry, shopping, community activity, and church  PERSONAL FACTORS: Age, Past/current experiences, Time since onset of injury/illness/exacerbation, and 3+ comorbidities: Pacemaker, CAD, depression, GERD, HTN, macular degeneration, psoriatic arthritis, back surgery 2000, B TKA, L TSA; per pt- MI in 2023 are also affecting patient's functional outcome.   REHAB POTENTIAL: Good  CLINICAL DECISION MAKING: Evolving/moderate complexity  EVALUATION COMPLEXITY: Moderate  PLAN:  PT FREQUENCY: 1-2x/week  PT DURATION: 6 weeks  PLANNED INTERVENTIONS: 97164- PT Re-evaluation, 97110-Therapeutic exercises, 97530- Therapeutic activity, 97112- Neuromuscular re-education, 97535- Self Care, 02859- Manual therapy, [id number]- Gait training, [phone]- Canalith repositioning, Patient/Family education, Balance training, Stair training, Taping, Vestibular training, DME instructions, Cryotherapy, and Moist heat  PLAN FOR NEXT SESSION:  Continue balance and strength -- work on improving sensory integration as able (limited vision due to macular degeneration).    Farhiya Rosten April Ma L Henleigh Robello, [place], DPT 04/04/24 9:32 AM  [facility] at [facility] [address] [place], [address] [place], [place] [address] Phone # [phone] Fax # [phone]

## 2024-04-05 ENCOUNTER — Encounter (HOSPITAL_BASED_OUTPATIENT_CLINIC_OR_DEPARTMENT_OTHER): Payer: Self-pay | Admitting: Cardiology

## 2024-04-05 ENCOUNTER — Other Ambulatory Visit (HOSPITAL_BASED_OUTPATIENT_CLINIC_OR_DEPARTMENT_OTHER): Payer: Self-pay

## 2024-04-05 ENCOUNTER — Ambulatory Visit (HOSPITAL_BASED_OUTPATIENT_CLINIC_OR_DEPARTMENT_OTHER): Admitting: Cardiology

## 2024-04-05 VITALS — BP 154/74 | HR 63 | Ht 61.0 in | Wt 144.6 lb

## 2024-04-05 DIAGNOSIS — I5042 Chronic combined systolic (congestive) and diastolic (congestive) heart failure: Secondary | ICD-10-CM | POA: Diagnosis not present

## 2024-04-05 DIAGNOSIS — I10 Essential (primary) hypertension: Secondary | ICD-10-CM

## 2024-04-05 DIAGNOSIS — R42 Dizziness and giddiness: Secondary | ICD-10-CM | POA: Diagnosis not present

## 2024-04-05 DIAGNOSIS — I2102 ST elevation (STEMI) myocardial infarction involving left anterior descending coronary artery: Secondary | ICD-10-CM | POA: Diagnosis not present

## 2024-04-05 DIAGNOSIS — I951 Orthostatic hypotension: Secondary | ICD-10-CM | POA: Diagnosis not present

## 2024-04-05 DIAGNOSIS — I2510 Atherosclerotic heart disease of native coronary artery without angina pectoris: Secondary | ICD-10-CM

## 2024-04-05 DIAGNOSIS — E785 Hyperlipidemia, unspecified: Secondary | ICD-10-CM | POA: Diagnosis not present

## 2024-04-05 DIAGNOSIS — I480 Paroxysmal atrial fibrillation: Secondary | ICD-10-CM | POA: Diagnosis not present

## 2024-04-05 MED ORDER — FLUZONE HIGH-DOSE 0.5 ML IM SUSY
0.5000 mL | PREFILLED_SYRINGE | Freq: Once | INTRAMUSCULAR | 0 refills | Status: AC
  Filled 2024-04-05: qty 0.5, 1d supply, fill #0

## 2024-04-05 NOTE — Patient Instructions (Addendum)
 Medication Instructions:  Continue current medications *If you need a refill on your cardiac medications before your next appointment, please call your pharmacy*  Lab Work: none If you have labs (blood work) drawn today and your tests are completely normal, you will receive your results only by: MyChart Message (if you have MyChart) OR A paper copy in the mail If you have any lab test that is abnormal or we need to change your treatment, we will call you to review the results.  Testing/Procedures: Echo to done in 3-4 months  Your physician has requested that you have an echocardiogram. Echocardiography is a painless test that uses sound waves to create images of your heart. It provides your doctor with information about the size and shape of your heart and how well your heart's chambers and valves are working. This procedure takes approximately one hour. There are no restrictions for this procedure. Please do NOT wear cologne, perfume, aftershave, or lotions (deodorant is allowed). Please arrive 15 minutes prior to your appointment time.  Please note: We ask at that you not bring children with you during ultrasound (echo/ vascular) testing. Due to room size and safety concerns, children are not allowed in the ultrasound rooms during exams. Our front office staff cannot provide observation of children in our lobby area while testing is being conducted. An adult accompanying a patient to their appointment will only be allowed in the ultrasound room at the discretion of the ultrasound technician under special circumstances. We apologize for any inconvenience.   Follow-Up: At [facility], you and your health needs are our priority.  As part of our continuing mission to provide you with exceptional heart care, our providers are all part of one team.  This team includes your primary Cardiologist (physician) and Advanced Practice Providers or APPs (Physician Assistants and Nurse  Practitioners) who all work together to provide you with the care you need, when you need it.  Your next appointment:   4 months  Provider:   Dr. KATE  We recommend signing up for the patient portal called MyChart.  Sign up information is provided on this After Visit Summary.  MyChart is used to connect with patients for Virtual Visits (Telemedicine).  Patients are able to view lab/test results, encounter notes, upcoming appointments, etc.  Non-urgent messages can be sent to your provider as well.   To learn more about what you can do with MyChart, go to [email or web address].   Other Instructions None

## 2024-04-09 ENCOUNTER — Other Ambulatory Visit (HOSPITAL_BASED_OUTPATIENT_CLINIC_OR_DEPARTMENT_OTHER): Payer: Self-pay

## 2024-04-09 DIAGNOSIS — I10 Essential (primary) hypertension: Secondary | ICD-10-CM | POA: Diagnosis not present

## 2024-04-09 DIAGNOSIS — N289 Disorder of kidney and ureter, unspecified: Secondary | ICD-10-CM | POA: Diagnosis not present

## 2024-04-09 DIAGNOSIS — R399 Unspecified symptoms and signs involving the genitourinary system: Secondary | ICD-10-CM | POA: Diagnosis not present

## 2024-04-09 DIAGNOSIS — E538 Deficiency of other specified B group vitamins: Secondary | ICD-10-CM | POA: Diagnosis not present

## 2024-04-09 DIAGNOSIS — E039 Hypothyroidism, unspecified: Secondary | ICD-10-CM | POA: Diagnosis not present

## 2024-04-09 MED ORDER — COMIRNATY 30 MCG/0.3ML IM SUSY
0.3000 mL | PREFILLED_SYRINGE | Freq: Once | INTRAMUSCULAR | 0 refills | Status: AC
  Filled 2024-04-09: qty 0.3, 1d supply, fill #0

## 2024-04-13 ENCOUNTER — Encounter

## 2024-04-18 NOTE — Progress Notes (Signed)
 Remote PPM Transmission

## 2024-05-13 ENCOUNTER — Other Ambulatory Visit: Payer: Self-pay | Admitting: Cardiology

## 2024-05-13 NOTE — Telephone Encounter (Signed)
 Prescription refill request for Eliquis  received. Indication:aflutter Last office visit:9/25 Scr:1.01  5/25 Age: 83 Weight:65.6  kg  Prescription refilled

## 2024-06-05 ENCOUNTER — Encounter: Payer: Medicare PPO | Attending: Cardiology

## 2024-06-05 DIAGNOSIS — I480 Paroxysmal atrial fibrillation: Secondary | ICD-10-CM | POA: Insufficient documentation

## 2024-06-06 DIAGNOSIS — H353114 Nonexudative age-related macular degeneration, right eye, advanced atrophic with subfoveal involvement: Secondary | ICD-10-CM | POA: Diagnosis not present

## 2024-06-06 DIAGNOSIS — H43812 Vitreous degeneration, left eye: Secondary | ICD-10-CM | POA: Diagnosis not present

## 2024-06-06 DIAGNOSIS — H353123 Nonexudative age-related macular degeneration, left eye, advanced atrophic without subfoveal involvement: Secondary | ICD-10-CM | POA: Diagnosis not present

## 2024-06-06 DIAGNOSIS — H353221 Exudative age-related macular degeneration, left eye, with active choroidal neovascularization: Secondary | ICD-10-CM | POA: Diagnosis not present

## 2024-06-06 DIAGNOSIS — H20042 Secondary noninfectious iridocyclitis, left eye: Secondary | ICD-10-CM | POA: Diagnosis not present

## 2024-06-06 DIAGNOSIS — H353212 Exudative age-related macular degeneration, right eye, with inactive choroidal neovascularization: Secondary | ICD-10-CM | POA: Diagnosis not present

## 2024-06-06 DIAGNOSIS — H35033 Hypertensive retinopathy, bilateral: Secondary | ICD-10-CM | POA: Diagnosis not present

## 2024-06-06 DIAGNOSIS — H43392 Other vitreous opacities, left eye: Secondary | ICD-10-CM | POA: Diagnosis not present

## 2024-06-06 LAB — CUP PACEART REMOTE DEVICE CHECK
Battery Remaining Longevity: 143 mo
Battery Voltage: 3.03 V
Brady Statistic AP VP Percent: 0.05 %
Brady Statistic AP VS Percent: 64.41 %
Brady Statistic AS VP Percent: 0.01 %
Brady Statistic AS VS Percent: 35.53 %
Brady Statistic RA Percent Paced: 64.48 %
Brady Statistic RV Percent Paced: 0.06 %
Date Time Interrogation Session: 20251111223009
Implantable Lead Connection Status: 753985
Implantable Lead Connection Status: 753985
Implantable Lead Implant Date: 20231103
Implantable Lead Implant Date: 20231103
Implantable Lead Location: 753859
Implantable Lead Location: 753860
Implantable Lead Model: 3830
Implantable Lead Model: 5076
Implantable Pulse Generator Implant Date: 20231103
Lead Channel Impedance Value: 285 Ohm
Lead Channel Impedance Value: 361 Ohm
Lead Channel Impedance Value: 437 Ohm
Lead Channel Impedance Value: 494 Ohm
Lead Channel Pacing Threshold Amplitude: 0.5 V
Lead Channel Pacing Threshold Amplitude: 1 V
Lead Channel Pacing Threshold Pulse Width: 0.4 ms
Lead Channel Pacing Threshold Pulse Width: 0.4 ms
Lead Channel Sensing Intrinsic Amplitude: 1.75 mV
Lead Channel Sensing Intrinsic Amplitude: 1.75 mV
Lead Channel Sensing Intrinsic Amplitude: 5.375 mV
Lead Channel Sensing Intrinsic Amplitude: 5.375 mV
Lead Channel Setting Pacing Amplitude: 1.5 V
Lead Channel Setting Pacing Amplitude: 2 V
Lead Channel Setting Pacing Pulse Width: 0.4 ms
Lead Channel Setting Sensing Sensitivity: 0.6 mV
Zone Setting Status: 755011

## 2024-06-10 ENCOUNTER — Encounter: Payer: Self-pay | Admitting: Cardiology

## 2024-06-10 NOTE — Progress Notes (Signed)
 Remote PPM Transmission

## 2024-06-24 ENCOUNTER — Ambulatory Visit: Payer: Self-pay | Admitting: Cardiovascular Disease

## 2024-06-26 DIAGNOSIS — E039 Hypothyroidism, unspecified: Secondary | ICD-10-CM | POA: Diagnosis not present

## 2024-07-09 ENCOUNTER — Ambulatory Visit (HOSPITAL_COMMUNITY)
Admission: RE | Admit: 2024-07-09 | Discharge: 2024-07-09 | Disposition: A | Source: Ambulatory Visit | Attending: Physician Assistant | Admitting: Physician Assistant

## 2024-07-09 VITALS — BP 122/58 | HR 68 | Ht 61.0 in | Wt 150.8 lb

## 2024-07-09 DIAGNOSIS — I480 Paroxysmal atrial fibrillation: Secondary | ICD-10-CM

## 2024-07-09 DIAGNOSIS — I4891 Unspecified atrial fibrillation: Secondary | ICD-10-CM

## 2024-07-09 DIAGNOSIS — D6869 Other thrombophilia: Secondary | ICD-10-CM

## 2024-07-09 NOTE — Progress Notes (Signed)
 Primary Care Physician: [name], MD Primary Cardiologist: [name], MD Electrophysiologist: [name], MD  Referring Physician: Dr Furbish Norway Connie [name] Nakatani is a 83 y.o. female with a history of HTN, HLD, CAD, CHF, hypothyroidism, psoriatic arthritis, tachybradycardia syndrome s/p PPM 2023, atrial fibrillation who presents for follow up in the [facility]. Zio patch x7 days on 03/25/2021 showed 2% atrial flutter burden. She was admitted 03/2022 with anterior STEMI, underwent DES to mid LAD; also noted to have severe mid RCA disease and underwent staged DES to RCA 04/11/2022. Echocardiogram showed EF 35 to 40%. During hospitalization noted to have A-fib with RVR and postconversion pauses up to 6 to 7 seconds, EP was consulted and PPM deferred. Outpatient monitor showed significant pauses along with A-fib/flutter with RVR. Underwent PPM 05/2022. Patient is on Eliquis  for stroke prevention.    Patient presents today for follow up for atrial fibrillation. She is in SR today and feels well. Her PPM shows <0.1% afib burden. No bleeding issues on anticoagulation.   Today, she denies symptoms of palpitations, chest pain, shortness of breath, orthopnea, PND, lower extremity edema, dizziness, presyncope, syncope, snoring, daytime somnolence, bleeding, or neurologic sequela. The patient is tolerating medications without difficulties and is otherwise without complaint today.    Atrial Fibrillation Risk Factors:  she does not have symptoms or diagnosis of sleep apnea. she does not have a history of rheumatic fever.   Atrial Fibrillation Management history:  Previous antiarrhythmic drugs: none Previous cardioversions: none Previous ablations: none Anticoagulation history: Eliquis   ROS- All systems are reviewed and negative except as per the HPI above.  Past Medical History:  Diagnosis Date   Arthritis    Coronary artery disease     Depression    Dysrhythmia    GERD (gastroesophageal reflux disease)    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   Multiple, noted on US    History of hiatal hernia    Hypertension    Hypothyroidism    Macular degeneration    Psoriatic arthritis (HCC)    Sinus drainage    UTI (urinary tract infection)     Current Outpatient Medications  Medication Sig Dispense Refill   aspirin  EC 81 MG tablet Take 1 tablet (81 mg total) by mouth daily. Swallow whole.     atorvastatin  (LIPITOR ) 40 MG tablet Take 1 tablet (40 mg total) by mouth daily. 90 tablet 3   buPROPion (WELLBUTRIN XL) 150 MG 24 hr tablet Take 150 mg by mouth daily.     carvedilol  (COREG ) 3.125 MG tablet TAKE 1 TABLET(3.125 MG) BY MOUTH TWICE DAILY 180 tablet 3   Cyanocobalamin  (VITAMIN B12) 1000 MCG TABS 1 tablet Orally Once a day     ELIQUIS  5 MG TABS tablet TAKE 1 TABLET(5 MG) BY MOUTH TWICE DAILY 60 tablet 5   empagliflozin  (JARDIANCE ) 10 MG TABS tablet Take 1 tablet (10 mg total) by mouth daily. 90 tablet 3   esomeprazole  (NEXIUM ) 20 MG capsule Take 1 capsule (20 mg total) by mouth as needed.     furosemide  (LASIX ) 20 MG tablet Take 20 mg by mouth 3 (three) times a week. (Patient taking differently: Take 20 mg by mouth as needed.)     levothyroxine  (SYNTHROID ) 75 MCG tablet Take 75 mcg by mouth daily before breakfast.     nitroGLYCERIN  (NITROSTAT ) 0.4 MG SL tablet PLACE 1 TABLET UNDER THE TONGUE EVERY 5 MINUTES FOR 3 DOSES AS NEEDED FOR CHEST PAIN 25 tablet 10  polyethylene glycol (MIRALAX ) 17 g packet Take 17 g by mouth daily. Titrate as needed (Patient taking differently: Take 17 g by mouth as needed. Titrate as needed)     sertraline  (ZOLOFT ) 100 MG tablet Take 100 mg by mouth daily.     traMADol  (ULTRAM ) 50 MG tablet 1 tablet Oral as needed; Duration: 2 days     No current facility-administered medications for this encounter.    Physical Exam: BP (!) 122/58   Pulse 68   Ht 5' 1 (1.549 m)   Wt 68.4 kg   BMI 28.49 kg/m   GEN: Well  nourished, well developed in no acute distress CARDIAC: Regular rate and rhythm, no murmurs, rubs, gallops RESPIRATORY:  Clear to auscultation without rales, wheezing or rhonchi  ABDOMEN: Soft, non-tender, non-distended EXTREMITIES:  No edema; No deformity   Wt Readings from Last 3 Encounters:  07/09/24 68.4 kg  04/05/24 65.6 kg  02/07/24 65.6 kg     EKG Interpretation Date/Time:  Tuesday July 09 2024 09:05:34 EST Ventricular Rate:  68 PR Interval:  144 QRS Duration:  116 QT Interval:  438 QTC Calculation: 465 R Axis:   -78  Text Interpretation: Normal sinus rhythm Incomplete right bundle branch block Left anterior fascicular block Anteroseptal infarct (cited on or before 19-Dec-2023) Abnormal ECG When compared with ECG of 05-Apr-2024 08:32, Sinus rhythm has replaced ATRIAL PACED RHYTHM Confirmed by Andres Bantz ([id number]) on 07/09/2024 9:17:29 AM    Echo 04/26/23 demonstrated   1. Left ventricular ejection fraction, by estimation, is 30 to 35%. The  left ventricle has moderately decreased function. The left ventricle  demonstrates regional wall motion abnormalities (see scoring  diagram/findings for description). Left ventricular diastolic parameters are consistent with Grade I diastolic dysfunction (impaired relaxation).   2. Right ventricular systolic function is normal. The right ventricular  size is normal. There is normal pulmonary artery systolic pressure. The  estimated right ventricular systolic pressure is 27.2 mmHg.   3. The mitral valve is degenerative. Mild mitral valve regurgitation. No  evidence of mitral stenosis.   4. The aortic valve is tricuspid. There is mild thickening of the aortic  valve. Aortic valve regurgitation is not visualized. No aortic stenosis is  present.   5. The inferior vena cava is normal in size with greater than 50%  respiratory variability, suggesting right atrial pressure of 3 mmHg.    CHA2DS2-VASc Score = 6  The patient's score is  based upon: CHF History: 1 HTN History: 1 Diabetes History: 0 Stroke History: 0 Vascular Disease History: 1 Age Score: 2 Gender Score: 1       ASSESSMENT AND PLAN: Paroxysmal Atrial Fibrillation (ICD10:  I48.0) The patient's CHA2DS2-VASc score is 6, indicating a 9.7% annual risk of stroke.   Patient in SR today. PPM shows <0.1% afib burden. Continue Eliquis  5 mg BID Continue carvedilol  3.125 mg BID  Secondary Hypercoagulable State (ICD10:  D68.69) The patient is at significant risk for stroke/thromboembolism based upon her CHA2DS2-VASc Score of 6.  Continue Apixaban  (Eliquis ). No bleeding issues.   Chronic HFrEF EF 30-35% GDMT per primary cardiology team Fluid status appears stable today  CAD No anginal symptoms Followed by Dr Kate  HTN Stable on current regimen  Tachybradycardia syndrome S/p PPM, followed by Dr Nancey   Follow up with Dr Nancey or EP APP in 6 months.     [facility] [address] [place], [place] [address] [phone]

## 2024-07-12 ENCOUNTER — Other Ambulatory Visit (INDEPENDENT_AMBULATORY_CARE_PROVIDER_SITE_OTHER)

## 2024-07-12 DIAGNOSIS — I5042 Chronic combined systolic (congestive) and diastolic (congestive) heart failure: Secondary | ICD-10-CM | POA: Diagnosis not present

## 2024-07-12 LAB — ECHOCARDIOGRAM COMPLETE
AR max vel: 1.97 cm2
AV Area VTI: 1.91 cm2
AV Area mean vel: 1.94 cm2
AV Mean grad: 3 mmHg
AV Peak grad: 4.8 mmHg
Ao pk vel: 1.1 m/s
Area-P 1/2: 2.53 cm2
S' Lateral: 2.96 cm

## 2024-07-12 MED ORDER — PERFLUTREN LIPID MICROSPHERE
1.0000 mL | INTRAVENOUS | Status: AC | PRN
  Administered 2024-07-12: 5 mL via INTRAVENOUS

## 2024-07-15 ENCOUNTER — Ambulatory Visit: Payer: Self-pay | Admitting: Cardiology

## 2024-08-01 ENCOUNTER — Other Ambulatory Visit: Payer: Self-pay | Admitting: Cardiology

## 2024-08-04 NOTE — Progress Notes (Unsigned)
 " Cardiology Office Note:    Date:  08/06/2024   ID:  Connie, [name] 12/31/1940, MRN [id number]  PCP:  [name], MD  Cardiologist:  [name], MD  Electrophysiologist:  [name], MD   Referring MD: [name], MD   Chief Complaint  Patient presents with   Congestive Heart Failure    History of Present Illness:    Connie [name] is a 84 y.o. female with a hx of CAD, chronic combined heart failure, paroxysmal atrial flutter, tachycardia-bradycardia syndrome s/p PPM, hypertension, hypothyroidism, hyperlipidemia, GERD, psoriatic arthritis who presents for follow-up.  She was referred by Dr Chrystal for evaluation of dyspnea and palpitations, initially seen on 03/10/2021.    Zio patch x7 days on 03/25/2021 showed 2% atrial flutter burden, average rate 141 bpm.  Longest episode lasted 15 minutes.  Also with occasional PACs, 2.3% of beats.  Echocardiogram 04/07/2021 showed normal biventricular function, no significant valvular disease.  Calcium  score on 04/07/2021 was 604 (81st percentile).  Lexiscan  Myoview  on 08/10/2021 showed normal perfusion, EF 79%.  She was admitted 03/2022 with anterior STEMI, underwent DES to mid LAD; also noted to have severe mid RCA disease and underwent staged DES to RCA 04/11/2022.  Echocardiogram showed EF 35 to 40%.  During hospitalization noted to have A-fib with RVR and postconversion pauses up to 6 to 7 seconds, EP was consulted and PPM deferred.  Outpatient monitor showed significant pauses along with A-fib/flutter with RVR.  Underwent PPM 05/2022.  Echocardiogram 04/2023 showed EF 30 to 35%, normal RV function, mild mitral regurgitation.  Echocardiogram 06/2024 showed EF 35 to 40%, mildly reduced RV function.  Since last clinic visit, she reports she is doing okay.  Reports her lightheadedness has improved. Denies any chest pain, dyspnea, syncope, lower extremity edema.  Reports occasional palpitations.  She is  taking Eliquis , denies any bleeding.  She is taking Lasix  1-2 times per month.  BP Readings from Last 3 Encounters:  08/06/24 (!) 158/80  07/09/24 (!) 122/58  04/05/24 (!) 154/74    Wt Readings from Last 3 Encounters:  08/06/24 148 lb (67.1 kg)  07/09/24 150 lb 12.8 oz (68.4 kg)  04/05/24 144 lb 9.6 oz (65.6 kg)     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   Multiple, noted on US    History of hiatal hernia    Hypertension    Hypothyroidism    Macular degeneration    Psoriatic arthritis (HCC)    Sinus drainage    UTI (urinary tract infection)     Past Surgical History:  Procedure Laterality Date   BACK SURGERY  2000   Dr Alix   bladder tack  2005   COLONOSCOPY     CORONARY ANGIOPLASTY     CORONARY STENT INTERVENTION N/A 04/11/2022   Procedure: CORONARY STENT INTERVENTION;  Surgeon: [name], MD;  Location: [facility] INVASIVE CV LAB;  Service: Cardiovascular;  Laterality: N/A;   DILATION AND CURETTAGE OF UTERUS     after miscarriage   FALSE ANEURYSM REPAIR Right 11/09/2022   Procedure: 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;  Surgeon: [name], MD;  Location: [facility] OR;  Service: Vascular;  Laterality: Right;   INSERT / REPLACE / REMOVE PACEMAKER     LEFT HEART CATH AND CORONARY ANGIOGRAPHY N/A  04/07/2022   Procedure: LEFT HEART CATH AND CORONARY ANGIOGRAPHY;  Surgeon: [name], MD;  Location: [facility] INVASIVE CV LAB;  Service: Cardiovascular;  Laterality: N/A;   PACEMAKER IMPLANT N/A 05/27/2022   Procedure: PACEMAKER IMPLANT;  Surgeon: [name], MD;  Location: [facility] INVASIVE CV LAB;  Service: Cardiovascular;  Laterality: N/A;   RECTAL PROLAPSE REPAIR  2008   SCAR REVISION Right 2017   Knee, Dr. Yvone   TONSILLECTOMY     as a child   TOTAL KNEE ARTHROPLASTY Right 09/05/2014   Procedure: TOTAL KNEE  ARTHROPLASTY;  Surgeon: [name], MD;  Location: [facility] OR;  Service: Orthopedics;  Laterality: Right;   TOTAL KNEE ARTHROPLASTY Left 01/10/2022   Procedure: TOTAL KNEE ARTHROPLASTY;  Surgeon: [name], MD;  Location: [facility] ORS;  Service: Orthopedics;  Laterality: Left;   TOTAL SHOULDER ARTHROPLASTY Left 11/17/2017   Procedure: LEFT TOTAL SHOULDER ARTHROPLASTY;  Surgeon: [name], MD;  Location: [facility] ORS;  Service: Orthopedics;  Laterality: Left;  with block   TUBAL LIGATION  1972    Current Medications: Current Meds  Medication Sig   atorvastatin  (LIPITOR ) 40 MG tablet Take 1 tablet (40 mg total) by mouth daily.   buPROPion (WELLBUTRIN XL) 150 MG 24 hr tablet Take 150 mg by mouth daily.   carvedilol  (COREG ) 3.125 MG tablet TAKE 1 TABLET(3.125 MG) BY MOUTH TWICE DAILY   Cyanocobalamin  (VITAMIN B12) 1000 MCG TABS 1 tablet Orally Once a day   ELIQUIS  5 MG TABS tablet TAKE 1 TABLET(5 MG) BY MOUTH TWICE DAILY   empagliflozin  (JARDIANCE ) 10 MG TABS tablet Take 1 tablet (10 mg total) by mouth daily.   esomeprazole  (NEXIUM ) 20 MG capsule Take 1 capsule (20 mg total) by mouth as needed.   furosemide  (LASIX ) 20 MG tablet Take 20 mg by mouth 3 (three) times a week. (Patient taking differently: Take 20 mg by mouth as needed.)   levothyroxine  (SYNTHROID ) 75 MCG tablet Take 75 mcg by mouth daily before breakfast.   losartan  (COZAAR ) 25 MG tablet Take 1 tablet (25 mg total) by mouth daily.   nitroGLYCERIN  (NITROSTAT ) 0.4 MG SL tablet PLACE 1 TABLET UNDER THE TONGUE EVERY 5 MINUTES FOR 3 DOSES AS NEEDED FOR CHEST PAIN   polyethylene glycol (MIRALAX ) 17 g packet Take 17 g by mouth daily. Titrate as needed (Patient taking differently: Take 17 g by mouth as needed. Titrate as needed)   sertraline  (ZOLOFT ) 100 MG tablet Take 100 mg by mouth daily.   traMADol  (ULTRAM ) 50 MG tablet 1 tablet Oral as needed; Duration: 2 days   [DISCONTINUED] aspirin  EC 81 MG tablet Take 1 tablet (81 mg total) by mouth daily.  Swallow whole.     Allergies:   Penicillins and Sulfa antibiotics   Social History   Socioeconomic History   Marital status: Married    Spouse name: [name]   Number of children: 2   Years of education: Not on file   Highest education level: Associate degree: academic program  Occupational History   Occupation: Retired   Occupation: retired  Tobacco Use   Smoking status: Never    Passive exposure: Never   Smokeless tobacco: Never  Vaping Use   Vaping status: Never Used  Substance and Sexual Activity   Alcohol  use: Not Currently    Comment: rarely wine   Drug use: No   Sexual activity: Not Currently    Partners: Male    Birth control/protection: Post-menopausal    Comment: married  Other Topics  Concern   Not on file  Social History Narrative   Lives at home with husband.  Son and daughter.  Right handed    Social Drivers of Health   Tobacco Use: Low Risk (04/05/2024)   Patient History    Smoking Tobacco Use: Never    Smokeless Tobacco Use: Never    Passive Exposure: Never  Financial Resource Strain: Low Risk (04/11/2022)   Overall Financial Resource Strain (CARDIA)    Difficulty of Paying Living Expenses: Not hard at all  Food Insecurity: No Food Insecurity (05/27/2022)   Hunger Vital Sign    Worried About Running Out of Food in the Last Year: Never true    Ran Out of Food in the Last Year: Never true  Transportation Needs: No Transportation Needs (05/27/2022)   PRAPARE - Administrator, Civil Service (Medical): No    Lack of Transportation (Non-Medical): No  Physical Activity: Not on file  Stress: Not on file  Social Connections: Not on file  Depression (PHQ2-9): Low Risk (01/11/2024)   Depression (PHQ2-9)    PHQ-2 Score: 3  Alcohol  Screen: Low Risk (04/11/2022)   Alcohol  Screen    Last Alcohol  Screening Score (AUDIT): 0  Housing: Low Risk (05/27/2022)   Housing    Last Housing Risk Score: 0  Utilities: Not At Risk (05/27/2022)   AHC Utilities     Threatened with loss of utilities: No  Health Literacy: Not on file     Family History: The patient's family history includes Cancer - Lung in her mother; Heart attack (age of onset: 62) in her father. There is no history of Breast cancer.  ROS:   Please see the history of present illness.     All other systems reviewed and are negative.  EKGs/Labs/Other Studies Reviewed:    The following studies were reviewed today:   EKG:   12/19/2023: Atrial paced, ventricular sensed rhythm.  Right bundle branch block, left anterior fascicular block, Q waves in V1-3, rate 63 04/05/2024: Atrial paced, ventricular sensed rhythm, right bundle branch block, left anterior fascicular block, Q waves in V1-5, rate 67  Recent Labs: 12/19/2023: ALT 27; BUN 16; Creatinine, Ser 1.01; Magnesium  2.2; Potassium 5.2; Sodium 142  Recent Lipid Panel    Component Value Date/Time   CHOL 129 12/19/2023 1050   TRIG 75 12/19/2023 1050   HDL 55 12/19/2023 1050   CHOLHDL 2.3 12/19/2023 1050   CHOLHDL 2.5 04/07/2022 1044   VLDL 21 04/07/2022 1044   LDLCALC 59 12/19/2023 1050    Physical Exam:    VS:  BP (!) 158/80 (BP Location: Right Arm, Patient Position: Sitting, Cuff Size: Normal)   Pulse 62   Ht 5' 1 (1.549 m)   Wt 148 lb (67.1 kg)   SpO2 97%   BMI 27.96 kg/m     Wt Readings from Last 3 Encounters:  08/06/24 148 lb (67.1 kg)  07/09/24 150 lb 12.8 oz (68.4 kg)  04/05/24 144 lb 9.6 oz (65.6 kg)     GEN:  Well nourished, well developed in no acute distress HEENT: Normal NECK: No JVD; No carotid bruits LYMPHATICS: No lymphadenopathy CARDIAC: RRR, no murmurs, rubs, gallops RESPIRATORY:  Clear to auscultation without rales, wheezing or rhonchi  ABDOMEN: Soft, non-tender, non-distended MUSCULOSKELETAL:  No edema; No deformity  SKIN: Warm and dry NEUROLOGIC:  Alert and oriented x 3 PSYCHIATRIC:  Normal affect   ASSESSMENT:    1. Chronic combined systolic and diastolic heart failure (HCC)  2.  Essential hypertension   3. Therapeutic drug monitoring   4. Coronary artery disease involving native coronary artery of native heart, unspecified whether angina present   5. Atrial fibrillation, unspecified type (HCC)   6. Lightheadedness   7. Hyperlipidemia, unspecified hyperlipidemia type       PLAN:    CAD: Status post anterior STEMI 03/2022, underwent DES to mid LAD and then staged DES to RCA. -Continue Eliquis  5 mg twice daily.  Can discontinue aspirin  -Continue carvedilol  3.125 mg twice daily -Continue atorvastatin  40 mg daily  Chronic combined systolic and diastolic heart failure: Echocardiogram 04/2023 showed EF 30 to 35%, normal RV function, mild mitral regurgitation.    Echocardiogram 06/2024 showed EF 35 to 40%, mildly reduced RV function. -Continue carvedilol  3.125 mg twice daily -Continue Jardiance  10 mg daily -She is now off Lasix , just takes as needed.  Appears euvolemic on exam -GDMT has been limited by soft BP.  BP has improved and she is now hypertensive.  Start losartan  25 mg daily.  Check BMET today and again in 1 to 2 weeks  Atrial fibrillation/flutter: Tachycardia-bradycardia syndrome, having A-fib with RVR and long postconversion pauses.  Status post PPM 05/2022, follows with EP -Continue Eliquis  5 mg twice daily -Continue carvedilol  3.125 mg twice daily -Developed transaminitis on amiodarone .  Seen by GI, recommended stopping amiodarone .  Amiodarone  has been discontinued, will monitor for recurrence of A-fib; have not noted recurrence.  LFTs have improved with holding amiodarone   Lightheadedness: Unclear cause.  Orthostatics at prior clinic visit showed decrease in systolic BP lying to standing but not meeting criteria for orthostatic hypotension.  Lasix  changed to as needed.  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  Atorvastatin  dose decreased to 40 mg daily due to transaminitis.  LDL 59 on 12/19/2023  Hypertension: BP elevated in clinic today.  Brought her home BP monitor to clinic today, which correlated with our reading.  Continue carvedilol  3.125 mg twice daily and will add losartan  25 mg daily as above.  Asked to check BP twice daily for next 2 weeks and let us  know results   RTC in 3 months    Medication Adjustments/Labs and Tests Ordered: Current medicines are reviewed at length with the patient today.  Concerns regarding medicines are outlined above.  Orders Placed This Encounter  Procedures   CBC with Differential/Platelet   Basic metabolic panel with GFR   Magnesium    Basic metabolic panel with GFR     Meds ordered this encounter  Medications   losartan  (COZAAR ) 25 MG tablet    Sig: Take 1 tablet (25 mg total) by mouth daily.    Dispense:  90 tablet    Refill:  0     Patient Instructions  Medication Instructions:  START LOSARTAN  25 MG DAILY   STOP ASPIRIN    *If you need a refill on your cardiac medications before your next appointment, please call your pharmacy*  Lab Work: BMET/MAGNESIUM /CBC TODAY   BMET IN 1 TO 2 WEEKS  If you have labs (blood work) drawn today and your tests are completely normal, you will receive your results only by: MyChart Message (if you have MyChart) OR A paper copy in the mail If you have any lab test that is abnormal or we need to change your treatment, we will call you  to review the results.  Testing/Procedures: NONE  Follow-Up: At [facility], you and your health needs are our priority.  As part of our continuing mission to provide you with exceptional heart care, our providers are all part of one team.  This team includes your primary Cardiologist (physician) and Advanced Practice Providers or APPs (Physician Assistants and Nurse Practitioners) who all  work together to provide you with the care you need, when you need it.  Your next appointment:   3 month(s)  Provider:   DR KATE OR One of our Advanced Practice Providers (APPs): Morse Clause, PA-C  Lamarr Satterfield, NP Miriam Shams, NP  Olivia Pavy, PA-C Josefa Beauvais, NP  Leontine Salen, PA-C Orren Fabry, PA-C  [place], PA-C Ernest Dick, NP  Damien Braver, NP Jon Hails, PA-C  Waddell Donath, PA-C    Dayna Dunn, PA-C  Scott Weaver, PA-C Lum Louis, NP Katlyn West, NP Callie Goodrich, PA-C  Xika Zhao, NP Sheng Haley, PA-C    Kathleen Johnson, PA-C   We recommend signing up for the patient portal called MyChart.  Sign up information is provided on this After Visit Summary.  MyChart is used to connect with patients for Virtual Visits (Telemedicine).  Patients are able to view lab/test results, encounter notes, upcoming appointments, etc.  Non-urgent messages can be sent to your provider as well.   To learn more about what you can do with MyChart, go to [email or web address].   Other Instructions MONITOR BLOOD PRESSURE AT HOME TWICE A DAY FOR 2 WEEKS. SEND  YOUR BLOOD PRESSURE READINGS IN VIA MYCHART            Signed, Lonni LITTIE Kate, MD  08/06/2024 9:59 AM    [facility] "

## 2024-08-06 ENCOUNTER — Ambulatory Visit: Attending: Cardiology | Admitting: Cardiology

## 2024-08-06 VITALS — BP 158/80 | HR 62 | Ht 61.0 in | Wt 148.0 lb

## 2024-08-06 DIAGNOSIS — I5042 Chronic combined systolic (congestive) and diastolic (congestive) heart failure: Secondary | ICD-10-CM

## 2024-08-06 DIAGNOSIS — E785 Hyperlipidemia, unspecified: Secondary | ICD-10-CM | POA: Diagnosis not present

## 2024-08-06 DIAGNOSIS — I2510 Atherosclerotic heart disease of native coronary artery without angina pectoris: Secondary | ICD-10-CM

## 2024-08-06 DIAGNOSIS — Z5181 Encounter for therapeutic drug level monitoring: Secondary | ICD-10-CM | POA: Diagnosis not present

## 2024-08-06 DIAGNOSIS — I10 Essential (primary) hypertension: Secondary | ICD-10-CM

## 2024-08-06 DIAGNOSIS — R42 Dizziness and giddiness: Secondary | ICD-10-CM | POA: Diagnosis not present

## 2024-08-06 DIAGNOSIS — I4891 Unspecified atrial fibrillation: Secondary | ICD-10-CM

## 2024-08-06 MED ORDER — LOSARTAN POTASSIUM 25 MG PO TABS: 25.0000 mg | ORAL_TABLET | Freq: Every day | ORAL | 0 refills | Status: AC

## 2024-08-06 NOTE — Patient Instructions (Signed)
 Medication Instructions:  START LOSARTAN  25 MG DAILY   STOP ASPIRIN    *If you need a refill on your cardiac medications before your next appointment, please call your pharmacy*  Lab Work: BMET/MAGNESIUM /CBC TODAY   BMET IN 1 TO 2 WEEKS  If you have labs (blood work) drawn today and your tests are completely normal, you will receive your results only by: MyChart Message (if you have MyChart) OR A paper copy in the mail If you have any lab test that is abnormal or we need to change your treatment, we will call you to review the results.  Testing/Procedures: NONE  Follow-Up: At [facility], you and your health needs are our priority.  As part of our continuing mission to provide you with exceptional heart care, our providers are all part of one team.  This team includes your primary Cardiologist (physician) and Advanced Practice Providers or APPs (Physician Assistants and Nurse Practitioners) who all work together to provide you with the care you need, when you need it.  Your next appointment:   3 month(s)  Provider:   DR KATE OR One of our Advanced Practice Providers (APPs): Morse Clause, PA-C  Lamarr Satterfield, NP Miriam Shams, NP  Olivia Pavy, PA-C Josefa Beauvais, NP  Leontine Salen, PA-C Orren Fabry, PA-C  [place], PA-C Ernest Dick, NP  Damien Braver, NP Jon Hails, PA-C  Waddell Donath, PA-C    Dayna Dunn, PA-C  Scott Weaver, PA-C Lum Louis, NP Katlyn West, NP Callie Goodrich, PA-C  Xika Zhao, NP Sheng Haley, PA-C    Kathleen Johnson, PA-C   We recommend signing up for the patient portal called MyChart.  Sign up information is provided on this After Visit Summary.  MyChart is used to connect with patients for Virtual Visits (Telemedicine).  Patients are able to view lab/test results, encounter notes, upcoming appointments, etc.  Non-urgent messages can be sent to your provider as well.   To learn more about what you can do with MyChart, go to  [email or web address].   Other Instructions MONITOR BLOOD PRESSURE AT HOME TWICE A DAY FOR 2 WEEKS. SEND  YOUR BLOOD PRESSURE READINGS IN VIA MYCHART

## 2024-08-07 ENCOUNTER — Ambulatory Visit: Payer: Self-pay | Admitting: Cardiology

## 2024-08-07 LAB — CBC WITH DIFFERENTIAL/PLATELET
Basophils Absolute: 0.1 x10E3/uL (ref 0.0–0.2)
Basos: 1 %
EOS (ABSOLUTE): 0.1 x10E3/uL (ref 0.0–0.4)
Eos: 1 %
Hematocrit: 41.8 % (ref 34.0–46.6)
Hemoglobin: 13.4 g/dL (ref 11.1–15.9)
Immature Grans (Abs): 0 x10E3/uL (ref 0.0–0.1)
Immature Granulocytes: 0 %
Lymphocytes Absolute: 1.3 x10E3/uL (ref 0.7–3.1)
Lymphs: 11 %
MCH: 26.5 pg — ABNORMAL LOW (ref 26.6–33.0)
MCHC: 32.1 g/dL (ref 31.5–35.7)
MCV: 83 fL (ref 79–97)
Monocytes Absolute: 1 x10E3/uL — ABNORMAL HIGH (ref 0.1–0.9)
Monocytes: 8 %
Neutrophils Absolute: 9 x10E3/uL — ABNORMAL HIGH (ref 1.4–7.0)
Neutrophils: 79 %
Platelets: 275 x10E3/uL (ref 150–450)
RBC: 5.05 x10E6/uL (ref 3.77–5.28)
RDW: 14.3 % (ref 11.7–15.4)
WBC: 11.6 x10E3/uL — ABNORMAL HIGH (ref 3.4–10.8)

## 2024-08-07 LAB — BASIC METABOLIC PANEL WITH GFR
BUN/Creatinine Ratio: 18 (ref 12–28)
BUN: 17 mg/dL (ref 8–27)
CO2: 19 mmol/L — AB (ref 20–29)
Calcium: 9.8 mg/dL (ref 8.7–10.3)
Chloride: 104 mmol/L (ref 96–106)
Creatinine, Ser: 0.96 mg/dL (ref 0.57–1.00)
Glucose: 99 mg/dL (ref 70–99)
Potassium: 5.2 mmol/L (ref 3.5–5.2)
Sodium: 141 mmol/L (ref 134–144)
eGFR: 58 mL/min/1.73 — AB

## 2024-08-07 LAB — MAGNESIUM: Magnesium: 2.2 mg/dL (ref 1.6–2.3)

## 2024-08-13 ENCOUNTER — Other Ambulatory Visit: Payer: Self-pay

## 2024-08-30 LAB — BASIC METABOLIC PANEL WITH GFR
BUN/Creatinine Ratio: 21 (ref 12–28)
BUN: 21 mg/dL (ref 8–27)
CO2: 21 mmol/L (ref 20–29)
Calcium: 9.4 mg/dL (ref 8.7–10.3)
Chloride: 104 mmol/L (ref 96–106)
Creatinine, Ser: 0.99 mg/dL (ref 0.57–1.00)
Glucose: 85 mg/dL (ref 70–99)
Potassium: 5 mmol/L (ref 3.5–5.2)
Sodium: 142 mmol/L (ref 134–144)
eGFR: 56 mL/min/{1.73_m2} — ABNORMAL LOW

## 2024-09-04 ENCOUNTER — Ambulatory Visit

## 2024-11-19 ENCOUNTER — Ambulatory Visit (HOSPITAL_BASED_OUTPATIENT_CLINIC_OR_DEPARTMENT_OTHER): Admitting: Cardiology

## 2024-12-04 ENCOUNTER — Ambulatory Visit

## 2025-03-05 ENCOUNTER — Ambulatory Visit

## 2025-06-04 ENCOUNTER — Ambulatory Visit

## 2025-09-03 ENCOUNTER — Ambulatory Visit
